# Patient Record
Sex: Male | Born: 1975 | Race: White | Hispanic: No | Marital: Married | Smoking: Former smoker
Health system: Southern US, Community
[De-identification: ages and names within clinical notes are randomized; demographics above are authoritative.]

## PROBLEM LIST (undated history)

## (undated) DIAGNOSIS — D649 Anemia, unspecified: Secondary | ICD-10-CM

## (undated) DIAGNOSIS — K900 Celiac disease: Secondary | ICD-10-CM

## (undated) DIAGNOSIS — K7460 Unspecified cirrhosis of liver: Secondary | ICD-10-CM

## (undated) DIAGNOSIS — I10 Essential (primary) hypertension: Secondary | ICD-10-CM

## (undated) DIAGNOSIS — J40 Bronchitis, not specified as acute or chronic: Secondary | ICD-10-CM

## (undated) DIAGNOSIS — J189 Pneumonia, unspecified organism: Secondary | ICD-10-CM

## (undated) HISTORY — DX: Pneumonia, unspecified organism: J18.9

## (undated) HISTORY — DX: Unspecified cirrhosis of liver: K74.60

## (undated) HISTORY — DX: Bronchitis, not specified as acute or chronic: J40

## (undated) HISTORY — PX: NOSE SURGERY: SHX723

## (undated) HISTORY — DX: Anemia, unspecified: D64.9

---

## 2018-08-07 ENCOUNTER — Emergency Department: Payer: Self-pay

## 2018-08-07 ENCOUNTER — Emergency Department
Admission: EM | Admit: 2018-08-07 | Discharge: 2018-08-07 | Disposition: A | Discharge status: Home / Self Care | Payer: Self-pay | Attending: Emergency Medicine | Admitting: Emergency Medicine

## 2018-08-07 ENCOUNTER — Encounter: Payer: Self-pay | Admitting: Emergency Medicine

## 2018-08-07 ENCOUNTER — Other Ambulatory Visit: Payer: Self-pay

## 2018-08-07 DIAGNOSIS — K7031 Alcoholic cirrhosis of liver with ascites: Secondary | ICD-10-CM | POA: Insufficient documentation

## 2018-08-07 DIAGNOSIS — I10 Essential (primary) hypertension: Secondary | ICD-10-CM | POA: Insufficient documentation

## 2018-08-07 HISTORY — DX: Essential (primary) hypertension: I10

## 2018-08-07 LAB — CBC
HCT: 39.4 % (ref 39.0–52.0)
Hemoglobin: 13.6 g/dL (ref 13.0–17.0)
MCH: 34.6 pg — ABNORMAL HIGH (ref 26.0–34.0)
MCHC: 34.5 g/dL (ref 30.0–36.0)
MCV: 100.3 fL — ABNORMAL HIGH (ref 80.0–100.0)
Platelets: 138 10*3/uL — ABNORMAL LOW (ref 150–400)
RBC: 3.93 MIL/uL — ABNORMAL LOW (ref 4.22–5.81)
RDW: 13.8 % (ref 11.5–15.5)
WBC: 4.9 10*3/uL (ref 4.0–10.5)
nRBC: 0 % (ref 0.0–0.2)

## 2018-08-07 LAB — COMPREHENSIVE METABOLIC PANEL
ALT: 84 U/L — ABNORMAL HIGH (ref 0–44)
AST: 367 U/L — ABNORMAL HIGH (ref 15–41)
Albumin: 3 g/dL — ABNORMAL LOW (ref 3.5–5.0)
Alkaline Phosphatase: 223 U/L — ABNORMAL HIGH (ref 38–126)
Anion gap: 14 (ref 5–15)
BUN: 14 mg/dL (ref 6–20)
CO2: 24 mmol/L (ref 22–32)
Calcium: 8.1 mg/dL — ABNORMAL LOW (ref 8.9–10.3)
Chloride: 99 mmol/L (ref 98–111)
Creatinine, Ser: 0.67 mg/dL (ref 0.61–1.24)
GFR calc Af Amer: >60 mL/min (ref >60)
GFR calc non Af Amer: >60 mL/min (ref >60)
Glucose, Bld: 93 mg/dL (ref 70–99)
Potassium: 3.4 mmol/L — ABNORMAL LOW (ref 3.5–5.1)
Sodium: 137 mmol/L (ref 135–145)
Total Bilirubin: 2.9 mg/dL — ABNORMAL HIGH (ref 0.3–1.2)
Total Protein: 8.3 g/dL — ABNORMAL HIGH (ref 6.5–8.1)

## 2018-08-07 LAB — URINALYSIS, COMPLETE (UACMP) WITH MICROSCOPIC
Bacteria, UA: NONE SEEN
Bilirubin Urine: NEGATIVE
Glucose, UA: NEGATIVE mg/dL
Hgb urine dipstick: NEGATIVE
Ketones, ur: 20 mg/dL — AB
Leukocytes,Ua: NEGATIVE
Nitrite: NEGATIVE
Protein, ur: 100 mg/dL — AB
Specific Gravity, Urine: 1.031 — ABNORMAL HIGH (ref 1.005–1.030)
Squamous Epithelial / HPF: NONE SEEN (ref 0–5)
pH: 5 (ref 5.0–8.0)

## 2018-08-07 LAB — LIPASE, BLOOD: Lipase: 51 U/L (ref 11–51)

## 2018-08-07 MED ORDER — LISINOPRIL-HYDROCHLOROTHIAZIDE 20-12.5 MG PO TABS: 1.0000 | ORAL_TABLET | Freq: Every day | ORAL | 2 refills | Status: DC

## 2018-08-07 MED ORDER — SODIUM CHLORIDE 0.9% FLUSH: 3.0000 mL | Freq: Once | INTRAVENOUS | Status: DC

## 2018-08-07 NOTE — ED Provider Notes (Signed)
Dg Abdomen 1 View  Result Date: 08/07/2018 CLINICAL DATA:  Acute abdominal distension. EXAM: ABDOMEN - 1 VIEW COMPARISON:  None. FINDINGS: The bowel gas pattern is normal. No radio-opaque calculi or other significant radiographic abnormality are seen. IMPRESSION: No evidence of bowel obstruction or ileus. Electronically Signed   By: Marijo Conception M.D.   On: 08/07/2018 14:16   US Abdomen Complete  Result Date: 08/07/2018 CLINICAL DATA:  Abnormal liver function tests. Ascites. Cirrhosis. Bloating for 3 days. EXAM: ABDOMEN ULTRASOUND COMPLETE COMPARISON:  Abdominal radiographs 08/07/2018 FINDINGS: Gallbladder: No gallstones. Mild gallbladder wall thickening at 0.4 cm. No sonographic Murphy sign noted by sonographer. Common bile duct: Diameter: 0.4 cm Liver: No focal lesion identified. Nodular contour with diffuse hyperechogenicity. Portal vein is patent on color Doppler imaging with normal direction of blood flow towards the liver. IVC: Not visualized due to overlying bowel gas. Pancreas: Not visualized due to overlying bowel gas. Spleen: Size and appearance within normal limits. Right Kidney: Length: 11.2 cm. Echogenicity within normal limits. No mass or hydronephrosis visualized. Left Kidney: Length: 11.6 cm. Echogenicity within normal limits. No mass or hydronephrosis visualized. Abdominal aorta: Not well seen due to overlying bowel gas Other findings: Moderate ascites. IMPRESSION: 1. Hyperechoic liver with nodular contour compatible with cirrhosis. 2. Moderate ascites. 3. Overlying bowel gas obscures the aorta, IVC, and pancreas. 4. Mild gallbladder wall thickening which could be from inflammation, but could also be from the patient's hypoalbuminemia. Electronically Signed   By: Van Clines M.D.   On: 08/07/2018 14:33     Discussed case and care with Dr. Allen Norris.  On exam, patient is awake and alert, no distress.  His abdomen is soft and not markedly distended.  Review his ultrasound as well as well  as his examination I do not think a diagnostic or therapeutic paracentesis is indicated.  Normal white count afebrile not complain of any infectious symptoms.  Additionally, he does not have a ton of fluid on ultrasound that would make me comfortable doing a bedside paracentesis.  Rather, plan of care will be for the patient to follow-up closely with GI consulting with the clinic on Monday as well as discontinuation of alcohol use.  Patient reports to me that he will never drink again, he reports he never goes through withdrawals despite his heavy drinking he reports he can go several days without any shakes or tremors or withdrawal symptoms.  Return precautions and treatment recommendations and follow-up discussed with the patient who is agreeable with the plan.  Shawn Martin was evaluated in Emergency Department on 08/07/2018 for the symptoms described in the history of present illness. He was evaluated in the context of the global COVID-19 pandemic, which necessitated consideration that the patient might be at risk for infection with the SARS-CoV-2 virus that causes COVID-19. Institutional protocols and algorithms that pertain to the evaluation of patients at risk for COVID-19 are in a state of rapid change based on information released by regulatory bodies including the CDC and federal and state organizations. These policies and algorithms were followed during the patient's care in the ED.      Medical screening examination/treatment/procedure(s) were conducted as a shared visit with non-physician practitioner(s) and myself.  I personally evaluated the patient during the encounter.      Delman Kitten, MD 08/07/18 1526

## 2018-08-07 NOTE — Discharge Instructions (Signed)
Your exam, labs, and [place] results are consistent with mild ascites and cirrhosis of the liver. This is due to your long history of regular alcohol drinking. You should try to limit and ultimately stop drinking. Follow-up with Dr. Allen Norris via phone on Monday, for further management. Return to the ED for severe abdominal pain or shortness of breath.

## 2018-08-07 NOTE — ED Triage Notes (Signed)
Pt to ED via POV, pt was sent over from [facility] for evaluation of abdominal bloating. Pt states that this has been going on for 3 days. Pt states that yesterday he drink a bottle of mag citrate. Pt states that he has recently been taking OTC medication to help with sex drive since getting married recently. Pt is also having some difficulty starting his urine stream but denies any other urinary symptoms. Pt is in NAD.

## 2018-08-07 NOTE — ED Provider Notes (Signed)
[facility] Emergency Department Provider Note ____________________________________________  Time seen: 1320  I have reviewed the triage vital signs and the nursing notes.  HISTORY  Chief Complaint  Bloated  HPI Shawn Martin is a 43 y.o. male [facility] for [facility] of abdominal bloating.  Patient reports over the last 3 days he has had fullness and some mild distention to his abdomen.  Patient thought he may have constipation, so he drank a bottle of magnesium citrate yesterday.  He reports having soft bowel movements throughout the day yesterday without hematochezia or melena noted.  Patient denies any frank abdominal pain, chest pain, shortness of breath, nausea, vomiting, or dizziness.  He does admit to some high life stressors at this moment, noting he is a newly with over the last 4 weeks.  He recently relocated to the area from [place], and has been unable to find employment in the time being. He admits to being regular drinker, and admits to increased alcohol intake over the last few weeks secondary to the stressors.  He told my attending that he drinks a pint of alcohol most days.  Patient denies any shaking, tremors, irritability, palpitations, headache, or syncope.  He denies any sick contacts, bad food, other exposures.  Past Medical History:  Diagnosis Date  . Hypertension     There are no active problems to display for this patient.   Past Surgical History:  Procedure Laterality Date  . NOSE SURGERY      Prior to Admission medications   Medication Sig Start Date End Date Taking? Authorizing Provider  lisinopril-hydrochlorothiazide (ZESTORETIC) 20-12.5 MG tablet Take 1 tablet by mouth daily. 08/07/18 11/05/18  Lennyx Verdell, Dannielle Karvonen, PA-C    Allergies Patient has no known allergies.  No family history on file.  Social History Social History   Tobacco Use  . Smoking status: Never Smoker  . Smokeless tobacco: Never  Used  Substance Use Topics  . Alcohol use: Yes    Comment: every day drinker, 6 pack per day  . Drug use: Not Currently    Review of Systems  Constitutional: Negative for fever. Eyes: Negative for visual changes. ENT: Negative for sore throat. Cardiovascular: Negative for chest pain. Respiratory: Negative for shortness of breath. Gastrointestinal: Negative for abdominal pain, vomiting and diarrhea. Reports bloating. Genitourinary: Negative for dysuria. Musculoskeletal: Negative for back pain. Skin: Negative for rash. Neurological: Negative for headaches, focal weakness or numbness. ____________________________________________  PHYSICAL EXAM:  VITAL SIGNS: ED Triage Vitals  Enc Vitals Group     BP 08/07/18 1128 (!) 157/101     Pulse Rate 08/07/18 1128 (!) 110     Resp 08/07/18 1128 18     Temp 08/07/18 1128 98 F (36.7 C)     Temp Source 08/07/18 1128 Oral     SpO2 08/07/18 1128 97 %     Weight 08/07/18 1133 220 lb (99.8 kg)     Height 08/07/18 1133 6' (1.829 m)     Head Circumference --      Peak Flow --      Pain Score 08/07/18 1133 0     Pain Loc --      Pain Edu? --      Excl. in [place]? --     Constitutional: Alert and oriented. Well appearing and in no distress. Head: Normocephalic and atraumatic. Eyes: Conjunctivae are an ictheric. Normal extraocular movements Cardiovascular: Normal rate, regular rhythm. Normal distal pulses. Respiratory: Normal respiratory effort.  No wheezes/rales/rhonchi. Gastrointestinal: Soft and nontender. Mild distention noted without rigidity, guarding, or organomegaly. Normoactive bowel sounds noted x 4. Tympany on percussion x 4.  Musculoskeletal: Nontender with normal range of motion in all extremities.  Neurologic:  Normal gait without ataxia. Normal speech and language. No gross focal neurologic deficits are appreciated. Skin:  Skin is warm, dry and intact. No rash noted. Psychiatric: Mood and affect are normal. Patient exhibits  appropriate insight and judgment. ____________________________________________   LABS (pertinent positives/negatives) Labs Reviewed  COMPREHENSIVE METABOLIC PANEL - Abnormal; Notable for the following components:      Result Value   Potassium 3.4 (*)    Calcium 8.1 (*)    Total Protein 8.3 (*)    Albumin 3.0 (*)    AST 367 (*)    ALT 84 (*)    Alkaline Phosphatase 223 (*)    Total Bilirubin 2.9 (*)    All other components within normal limits  CBC - Abnormal; Notable for the following components:   RBC 3.93 (*)    MCV 100.3 (*)    MCH 34.6 (*)    Platelets 138 (*)    All other components within normal limits  URINALYSIS, COMPLETE (UACMP) WITH MICROSCOPIC - Abnormal; Notable for the following components:   Color, Urine AMBER (*)    APPearance HAZY (*)    Specific Gravity, Urine 1.031 (*)    Ketones, ur 20 (*)    Protein, ur 100 (*)    All other components within normal limits  LIPASE, BLOOD  ____________________________________________   RADIOLOGY DG ABD Upright  IMPRESSION: No evidence of bowel obstruction or ileus.  ABD [place] Complete  IMPRESSION: 1. Hyperechoic liver with nodular contour compatible with cirrhosis. 2. Moderate ascites. 3. Overlying bowel gas obscures the aorta, IVC, and pancreas. 4. Mild gallbladder wall thickening which could be from inflammation, but could also be from the patient's hypoalbuminemia. ____________________________________________  PROCEDURES  Procedures ____________________________________________  INITIAL IMPRESSION / ASSESSMENT AND PLAN / ED COURSE  Shawn Martin was evaluated in Emergency Department on 08/07/2018 for the symptoms described in the history of present illness. He was evaluated in the context of the global COVID-19 pandemic, which necessitated consideration that the patient might be at risk for infection with the SARS-CoV-2 virus that causes COVID-19. Institutional protocols and algorithms that pertain to the  evaluation of patients at risk for COVID-19 are in a state of rapid change based on information released by regulatory bodies including the CDC and federal and state organizations. These policies and algorithms were followed during the patient's care in the ED.  Differential diagnosis includes, but is not limited to, acute appendicitis, renal colic, testicular torsion, urinary tract infection/pyelonephritis, prostatitis,  epididymitis, diverticulitis, small bowel obstruction or ileus, colitis, abdominal aortic aneurysm, gastroenteritis, hernia, etc.  ----------------------------------------- 2:54 PM on 08/07/2018 ----------------------------------------- S/w Dr. Allen Norris: He believes the patient might benefit from a therapeutic paracentesis, but US shows only mild ascites on imaging. He will be willing to see the patient in the office if admission is declined.    Patient with ED evaluation of abdominal discomfort described by the patient bloating.  Patient with a history of chronic alcohol use, is found to have new onset ascites secondary to mild cirrhosis.  Patient has been updated on his lab and ultrasound findings.  He is motivated to stop drinking at this time.  He was given opportunity to ask questions, and understands his discharge instructions.  He is to follow-up with gastroenterology next week for further  management of his symptoms.  He is encouraged to avoid acetaminophen in the interim, and he should return to the ED immediately for any increased abdominal pain, shortness of breath, nausea, or vomiting. ____________________________________________  FINAL CLINICAL IMPRESSION(S) / ED DIAGNOSES  Final diagnoses:  Alcoholic cirrhosis of liver with ascites (HCC)       Gennell How, Dannielle Karvonen, PA-C 08/07/18 1812    Delman Kitten, MD 08/08/18 1600

## 2019-01-15 DIAGNOSIS — Z20828 Contact with and (suspected) exposure to other viral communicable diseases: Secondary | ICD-10-CM | POA: Diagnosis not present

## 2019-01-28 ENCOUNTER — Other Ambulatory Visit (INDEPENDENT_AMBULATORY_CARE_PROVIDER_SITE_OTHER): Payer: BC Managed Care – PPO

## 2019-01-28 ENCOUNTER — Encounter: Payer: Self-pay | Admitting: Gastroenterology

## 2019-01-28 ENCOUNTER — Ambulatory Visit: Payer: BC Managed Care – PPO | Admitting: Gastroenterology

## 2019-01-28 VITALS — BP 138/88 | HR 116 | Temp 98.7°F | Ht 72.0 in | Wt 230.0 lb

## 2019-01-28 DIAGNOSIS — Z1159 Encounter for screening for other viral diseases: Secondary | ICD-10-CM

## 2019-01-28 DIAGNOSIS — R188 Other ascites: Secondary | ICD-10-CM | POA: Diagnosis not present

## 2019-01-28 DIAGNOSIS — K7031 Alcoholic cirrhosis of liver with ascites: Secondary | ICD-10-CM

## 2019-01-28 LAB — CBC WITH DIFFERENTIAL/PLATELET
Basophils Absolute: 0 10*3/uL (ref 0.0–0.1)
Basophils Relative: 1.4 % (ref 0.0–3.0)
Eosinophils Absolute: 0.1 10*3/uL (ref 0.0–0.7)
Eosinophils Relative: 1.9 % (ref 0.0–5.0)
HCT: 42.7 % (ref 39.0–52.0)
Hemoglobin: 14.4 g/dL (ref 13.0–17.0)
Lymphocytes Relative: 41.7 % (ref 12.0–46.0)
Lymphs Abs: 1.4 10*3/uL (ref 0.7–4.0)
MCHC: 33.6 g/dL (ref 30.0–36.0)
MCV: 99.3 fl (ref 78.0–100.0)
Monocytes Absolute: 0.4 10*3/uL (ref 0.1–1.0)
Monocytes Relative: 13.4 % — ABNORMAL HIGH (ref 3.0–12.0)
Neutro Abs: 1.4 10*3/uL (ref 1.4–7.7)
Neutrophils Relative %: 41.6 % — ABNORMAL LOW (ref 43.0–77.0)
Platelets: 162 10*3/uL (ref 150.0–400.0)
RBC: 4.3 Mil/uL (ref 4.22–5.81)
RDW: 14.3 % (ref 11.5–15.5)
WBC: 3.4 10*3/uL — ABNORMAL LOW (ref 4.0–10.5)

## 2019-01-28 LAB — IBC PANEL
Iron: 127 ug/dL (ref 42–165)
Saturation Ratios: 44.3 % (ref 20.0–50.0)
Transferrin: 205 mg/dL — ABNORMAL LOW (ref 212.0–360.0)

## 2019-01-28 LAB — PROTIME-INR
INR: 1.2 ratio — ABNORMAL HIGH (ref 0.8–1.0)
Prothrombin Time: 14 s — ABNORMAL HIGH (ref 9.6–13.1)

## 2019-01-28 LAB — FERRITIN: Ferritin: 141.7 ng/mL (ref 22.0–322.0)

## 2019-01-28 MED ORDER — SPIRONOLACTONE 50 MG PO TABS: 50.0000 mg | ORAL_TABLET | Freq: Every day | ORAL | 2 refills | Status: DC

## 2019-01-28 MED ORDER — FUROSEMIDE 20 MG PO TABS: 20.0000 mg | ORAL_TABLET | Freq: Every day | ORAL | 2 refills | Status: DC

## 2019-01-28 NOTE — Patient Instructions (Addendum)
If you are age 45 or older, your body mass index should be between 23-30. Your Body mass index is 31.19 kg/m. If this is out of the aforementioned range listed, please consider follow up with your Primary Care Provider.  If you are age 73 or younger, your body mass index should be between 19-25. Your Body mass index is 31.19 kg/m. If this is out of the aformentioned range listed, please consider follow up with your Primary Care Provider.    Your provider has requested that you go to the basement level for lab work before leaving today. Press "B" on the elevator. The lab is located at the first door on the left as you exit the elevator.   1. Please come back to our office on 11/6 for repeat labs   We have sent the following medications to your pharmacy for you to pick up at your convenience:   1. Start Lasix 20 mg daily.    2. Start Aldactone 50 mg daily.    You have been scheduled for an abdominal ultrasound at [facility] (1st floor of hospital) on 02/07/19 at 10:00am. Please arrive 15 minutes prior to your appointment for registration. Make certain not to have anything to eat or drink 6 hours prior to your appointment. Should you need to reschedule your appointment, please contact radiology at [phone]. This test typically takes about 30 minutes to perform.   You have been scheduled for an abdominal paracentesis at [facility] radiology (1st floor of hospital) on 02/07/19 at 9:00am. Please arrive at least 15 minutes prior to your appointment time for registration. Should you need to reschedule this appointment for any reason, please call our office at [phone].  You have been scheduled for an endoscopy. Please follow written instructions given to you at your visit today. If you use inhalers (even only as needed), please bring them with you on the day of your procedure. Your physician has requested that you go to www.startemmi.com and enter the access code given to you at your  visit today. This web site gives a general overview about your procedure. However, you should still follow specific instructions given to you by our office regarding your preparation for the procedure.

## 2019-01-28 NOTE — Progress Notes (Signed)
Agree with assessment and plan as outlined.  

## 2019-01-28 NOTE — Progress Notes (Signed)
01/28/2019 Shawn Martin [id number] 09-24-1975   HISTORY OF PRESENT ILLNESS: This is a pleasant 43 year old male who is new to our office.  He is here today at the recommendation from the emergency department.  He was seen there back in May for high blood pressure and complaints of abdominal bloating.  He was diagnosed at that time with cirrhosis and associated ascites.  He moved here from [place] fairly recently and does not yet have a PCP.  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  Unfortunately he delayed his evaluation and is here now to get things straightened out.  He has been completely sober from alcohol for 13 days, last alcohol being October 17.  He complains of abdominal bloating and swelling, but not pain per se.  Some lower extremity swelling.  No confusion.  No sign of GI bleeding.   Past Medical History:  Diagnosis Date   Hepatic cirrhosis ([place])    Hypertension    Past Surgical History:  Procedure Laterality Date   NOSE SURGERY      reports that he has never smoked. He has quit using smokeless tobacco.  His smokeless tobacco use included chew. He reports previous alcohol use. He reports that he does not use drugs. family history includes Hypertension in his father; Leukemia in his brother. No Known Allergies    Outpatient Encounter Medications as of 01/28/2019  Medication Sig   lisinopril-hydrochlorothiazide (ZESTORETIC) 20-12.5 MG tablet Take 1 tablet by mouth daily. (Patient not taking: Reported on 01/28/2019)   No facility-administered encounter medications on file as of 01/28/2019.      REVIEW OF SYSTEMS  : All other systems reviewed and negative except where noted in the History of Present Illness.   PHYSICAL EXAM: BP 138/88    Pulse (!) 116    Temp 98.7 F (37.1 C)    Ht 6' (1.829 m)    Wt  230 lb (104.3 kg)    BMI 31.19 kg/m  General: Well developed white male in no acute distress Head: Normocephalic and atraumatic Eyes:  Sclerae anicteric, conjunctiva pink. Ears: Normal auditory acuity Lungs: Clear throughout to auscultation; no increased WOB. Heart: Regular rate and rhythm; no M/R/G. Abdomen: Soft, distended with ascites fluid.  BS present.  Non-tender. Musculoskeletal: Symmetrical with no gross deformities  Skin: No lesions on visible extremities Extremities: 1-2+ pitting edema noted in B/L LE's. Neurological: Alert oriented x 4, grossly non-focal Psychological:  Alert and cooperative. Normal mood and affect  ASSESSMENT AND PLAN: *43 year old male with a new diagnosis of cirrhosis with ascites.  Highly suspicious that this is secondary to alcohol as he has been drinking very heavily for quite some time in relation to depression from divorce, ex-wife, and other personal issues at home.  Has not had any recent labs as this was all diagnosed back in May.  We will repeat CBC and CMP as well as checking a PT/INR, AFP level, hepatitis B and C serologies, iron studies.  We will repeat abdominal ultrasound for [place] screening.  Needs EGD for variceal screening.  I am going to schedule him for a paracentesis with no more than 2 L removed.  This is going to mostly for diagnostic purposes, we will try to give him a little bit of relief as well although he is not extremely symptomatic at  this point.  We will send fluid studies for cell count, culture, cytology, and albumin.  We will start low-dose Lasix 20 mg daily and spironolactone 50 mg daily.  Prescriptions sent.  I then want a repeat a BMP in 7 to 10 days.  Will schedule for EGD with Dr. Havery Moros.  Discussed the 2 g sodium diet as well as recording daily weights.  **The risks, benefits, and alternatives to EGD were discussed with the patient and he consents to proceed.   CC:  No ref. provider found

## 2019-01-31 LAB — HEPATITIS B SURFACE ANTIBODY,QUALITATIVE: Hep B S Ab: NONREACTIVE

## 2019-01-31 LAB — HEPATITIS B SURFACE ANTIGEN: Hepatitis B Surface Ag: NONREACTIVE

## 2019-01-31 LAB — HEPATITIS C ANTIBODY
Hepatitis C Ab: NONREACTIVE
SIGNAL TO CUT-OFF: 0.11 (ref <1.00)

## 2019-01-31 LAB — AFP TUMOR MARKER: AFP-Tumor Marker: 4.3 ng/mL (ref <6.1)

## 2019-02-01 ENCOUNTER — Other Ambulatory Visit: Payer: Self-pay

## 2019-02-01 DIAGNOSIS — K7031 Alcoholic cirrhosis of liver with ascites: Secondary | ICD-10-CM

## 2019-02-04 ENCOUNTER — Other Ambulatory Visit (INDEPENDENT_AMBULATORY_CARE_PROVIDER_SITE_OTHER): Payer: BC Managed Care – PPO

## 2019-02-04 ENCOUNTER — Encounter: Payer: Self-pay | Admitting: Gastroenterology

## 2019-02-04 ENCOUNTER — Other Ambulatory Visit: Payer: Self-pay | Admitting: Gastroenterology

## 2019-02-04 DIAGNOSIS — K7031 Alcoholic cirrhosis of liver with ascites: Secondary | ICD-10-CM

## 2019-02-04 DIAGNOSIS — Z1159 Encounter for screening for other viral diseases: Secondary | ICD-10-CM | POA: Diagnosis not present

## 2019-02-04 LAB — COMPREHENSIVE METABOLIC PANEL
ALT: 46 U/L (ref 0–53)
AST: 110 U/L — ABNORMAL HIGH (ref 0–37)
Albumin: 2.6 g/dL — ABNORMAL LOW (ref 3.5–5.2)
Alkaline Phosphatase: 112 U/L (ref 39–117)
BUN: 6 mg/dL (ref 6–23)
CO2: 31 mEq/L (ref 19–32)
Calcium: 7.7 mg/dL — ABNORMAL LOW (ref 8.4–10.5)
Chloride: 95 mEq/L — ABNORMAL LOW (ref 96–112)
Creatinine, Ser: 0.68 mg/dL (ref 0.40–1.50)
GFR: 125.81 mL/min (ref >60.00)
Glucose, Bld: 119 mg/dL — ABNORMAL HIGH (ref 70–99)
Potassium: 3.3 mEq/L — ABNORMAL LOW (ref 3.5–5.1)
Sodium: 134 mEq/L — ABNORMAL LOW (ref 135–145)
Total Bilirubin: 1.6 mg/dL — ABNORMAL HIGH (ref 0.2–1.2)
Total Protein: 6.5 g/dL (ref 6.0–8.3)

## 2019-02-04 LAB — SARS CORONAVIRUS 2 (TAT 6-24 HRS): SARS Coronavirus 2: NEGATIVE

## 2019-02-07 ENCOUNTER — Other Ambulatory Visit: Payer: Self-pay

## 2019-02-07 ENCOUNTER — Encounter (HOSPITAL_COMMUNITY): Payer: Self-pay | Admitting: Radiology

## 2019-02-07 ENCOUNTER — Ambulatory Visit (HOSPITAL_COMMUNITY)
Admission: RE | Admit: 2019-02-07 | Discharge: 2019-02-07 | Disposition: A | Discharge status: Home / Self Care | Payer: BC Managed Care – PPO | Source: Ambulatory Visit | Attending: Gastroenterology | Admitting: Gastroenterology

## 2019-02-07 DIAGNOSIS — R188 Other ascites: Secondary | ICD-10-CM | POA: Insufficient documentation

## 2019-02-07 DIAGNOSIS — K7031 Alcoholic cirrhosis of liver with ascites: Secondary | ICD-10-CM

## 2019-02-07 DIAGNOSIS — K7460 Unspecified cirrhosis of liver: Secondary | ICD-10-CM | POA: Diagnosis not present

## 2019-02-07 HISTORY — PX: IR PARACENTESIS: IMG2679

## 2019-02-07 LAB — BODY FLUID CELL COUNT WITH DIFFERENTIAL
Lymphs, Fluid: 67 %
Monocyte-Macrophage-Serous Fluid: 30 % — ABNORMAL LOW (ref 50–90)
Neutrophil Count, Fluid: 3 % (ref 0–25)
Total Nucleated Cell Count, Fluid: 61 cu mm (ref 0–1000)

## 2019-02-07 LAB — ALBUMIN, PLEURAL OR PERITONEAL FLUID: Albumin, Fluid: <1 g/dL

## 2019-02-07 LAB — GRAM STAIN

## 2019-02-07 MED ORDER — LIDOCAINE HCL (PF) 1 % IJ SOLN
INTRAMUSCULAR | Status: DC | PRN
  Administered 2019-02-07: 10 mL via SUBCUTANEOUS

## 2019-02-07 MED ORDER — LIDOCAINE HCL 1 % IJ SOLN
INTRAMUSCULAR | Status: AC
  Filled 2019-02-07: qty 20

## 2019-02-07 NOTE — Procedures (Signed)
PROCEDURE SUMMARY:  Successful US guided paracentesis from RLQ.  Yielded 2L of hazy yellow fluid.  No immediate complications.  Pt tolerated well.   Specimen was sent for labs.  EBL < 40m  KAscencion DikePA-C 02/07/2019 9:39 AM

## 2019-02-08 ENCOUNTER — Encounter: Payer: Self-pay | Admitting: Gastroenterology

## 2019-02-08 ENCOUNTER — Ambulatory Visit (AMBULATORY_SURGERY_CENTER): Payer: BC Managed Care – PPO | Admitting: Gastroenterology

## 2019-02-08 VITALS — BP 144/90 | HR 90 | Temp 98.5°F | Resp 19 | Ht 73.0 in | Wt 230.0 lb

## 2019-02-08 DIAGNOSIS — K2950 Unspecified chronic gastritis without bleeding: Secondary | ICD-10-CM | POA: Diagnosis not present

## 2019-02-08 DIAGNOSIS — I8510 Secondary esophageal varices without bleeding: Secondary | ICD-10-CM

## 2019-02-08 DIAGNOSIS — K766 Portal hypertension: Secondary | ICD-10-CM | POA: Diagnosis not present

## 2019-02-08 DIAGNOSIS — K7031 Alcoholic cirrhosis of liver with ascites: Secondary | ICD-10-CM

## 2019-02-08 DIAGNOSIS — K3189 Other diseases of stomach and duodenum: Secondary | ICD-10-CM | POA: Diagnosis not present

## 2019-02-08 LAB — CYTOLOGY - NON PAP

## 2019-02-08 MED ORDER — SODIUM CHLORIDE 0.9 % IV SOLN: 500.0000 mL | Freq: Once | INTRAVENOUS | Status: DC

## 2019-02-08 MED ORDER — PROPRANOLOL HCL 10 MG PO TABS: 10.0000 mg | ORAL_TABLET | Freq: Two times a day (BID) | ORAL | 3 refills | Status: DC

## 2019-02-08 NOTE — Progress Notes (Signed)
Pt's states no medical or surgical changes since previsit or office visit.  Cw vitals, SF IV and JB temps.

## 2019-02-08 NOTE — Progress Notes (Signed)
Called to room to assist during endoscopic procedure.  Patient ID and intended procedure confirmed with present staff. Received instructions for my participation in the procedure from the performing physician.  

## 2019-02-08 NOTE — Op Note (Signed)
[facility] Patient Name: Shawn Martin Procedure Date: 02/08/2019 10:28 AM MRN: [id number] Endoscopist: Remo Lipps P. Havery Moros , MD Age: 43 Referring MD:  Date of Birth: [birth year] Gender: Male Account #: [id number] Procedure:                Upper GI endoscopy Indications:              Cirrhosis rule out esophageal varices, history of                            ascites on aldactone / lasix, recent paracentesis. Medicines:                Monitored Anesthesia Care Procedure:                Pre-Anesthesia Assessment:                           - Prior to the procedure, a History and Physical                            was performed, and patient medications and                            allergies were reviewed. The patient's tolerance of                            previous anesthesia was also reviewed. The risks                            and benefits of the procedure and the sedation                            options and risks were discussed with the patient.                            All questions were answered, and informed consent                            was obtained. Prior Anticoagulants: The patient has                            taken no previous anticoagulant or antiplatelet                            agents. ASA Grade Assessment: II - A patient with                            mild systemic disease. After reviewing the risks                            and benefits, the patient was deemed in                            satisfactory condition to undergo the procedure.  After obtaining informed consent, the endoscope was                            passed under direct vision. Throughout the                            procedure, the patient's blood pressure, pulse, and                            oxygen saturations were monitored continuously. The                            Endoscope was introduced through the mouth, and   advanced to the second part of duodenum. The upper                            GI endoscopy was accomplished without difficulty.                            The patient tolerated the procedure well. Scope In: Scope Out: Findings:                 Esophagogastric landmarks were identified: the                            Z-line was found at 37 cm, the gastroesophageal                            junction was found at 37 cm and the upper extent of                            the gastric folds was found at 37 cm from the                            incisors.                           Grade II varices were found in the middle third of                            the esophagus and in the lower third of the                            esophagus. They were medium in size. No high risk                            stigmata for bleeding was noted.                           The exam of the esophagus was otherwise normal.                           Portal hypertensive gastropathy was found in the  gastric fundus and in the gastric body.                           The exam of the stomach was otherwise normal.                           Biopsies were taken with a cold forceps in the                            gastric body, at the incisura and in the gastric                            antrum for Helicobacter pylori testing.                           Diffuse mucosal flattening with some scalloping was                            found in the second portion of the duodenum,                            perhaps due to portal hypertension. Biopsies for                            histology were taken with a cold forceps for                            evaluation of celiac disease.                           The exam of the duodenum was otherwise normal. Complications:            No immediate complications. Estimated blood loss:                            Minimal. Estimated Blood Loss:     Estimated  blood loss was minimal. Impression:               - Esophagogastric landmarks identified.                           - Grade II esophageal varices.                           - Portal hypertensive gastropathy.                           - Flattened / scalloped mucosa was found in the                            duodenum. Biopsied.                           - Biopsies were taken with a cold forceps for  Helicobacter pylori testing. Recommendation:           - Patient has a contact number available for                            emergencies. The signs and symptoms of potential                            delayed complications were discussed with the                            patient. Return to normal activities tomorrow.                            Written discharge instructions were provided to the                            patient.                           - Resume previous diet.                           - Continue present medications.                           - Start propranolol for treatment of varices. Given                            recently started diuretics for ascites, will start                            low dose with 79m twice daily. Repeat BMET in 2                            weeks with planned follow up in the office.                           - Await pathology results.                           - Complete alcohol abstinence SRemo LippsP. Grier Vu, MD 02/08/2019 11:00:52 AM This report has been signed electronically.

## 2019-02-08 NOTE — Progress Notes (Signed)
PT taken to PACU. Monitors in place. VSS. Report given to RN. 

## 2019-02-08 NOTE — Patient Instructions (Signed)
Impression/Recommendations:  Resume previous diet. Continue present medications.  Start Propranolol 10 mg 2 times daily.  Repeat BMET in 2 weeks with planned follow-up in office.  Await pathology results.  Complete alcohol abstinence.  YOU HAD AN ENDOSCOPIC PROCEDURE TODAY AT [facility]:   Refer to the procedure report that was given to you for any specific questions about what was found during the examination.  If the procedure report does not answer your questions, please call your gastroenterologist to clarify.  If you requested that your care partner not be given the details of your procedure findings, then the procedure report has been included in a sealed envelope for you to review at your convenience later.  YOU SHOULD EXPECT: Some feelings of bloating in the abdomen. Passage of more gas than usual.  Walking can help get rid of the air that was put into your GI tract during the procedure and reduce the bloating. If you had a lower endoscopy (such as a colonoscopy or flexible sigmoidoscopy) you may notice spotting of blood in your stool or on the toilet paper. If you underwent a bowel prep for your procedure, you may not have a normal bowel movement for a few days.  Please Note:  You might notice some irritation and congestion in your nose or some drainage.  This is from the oxygen used during your procedure.  There is no need for concern and it should clear up in a day or so.  SYMPTOMS TO REPORT IMMEDIATELY:   Following upper endoscopy (EGD)  Vomiting of blood or coffee ground material  New chest pain or pain under the shoulder blades  Painful or persistently difficult swallowing  New shortness of breath  Fever of 100F or higher  Black, tarry-looking stools  For urgent or emergent issues, a gastroenterologist can be reached at any hour by calling [phone].   DIET:  We do recommend a small meal at first, but then you may proceed to your regular diet.   Drink plenty of fluids but you should avoid alcoholic beverages for 24 hours.  ACTIVITY:  You should plan to take it easy for the rest of today and you should NOT DRIVE or use heavy machinery until tomorrow (because of the sedation medicines used during the test).    FOLLOW UP: Our staff will call the number listed on your records 48-72 hours following your procedure to check on you and address any questions or concerns that you may have regarding the information given to you following your procedure. If we do not reach you, we will leave a message.  We will attempt to reach you two times.  During this call, we will ask if you have developed any symptoms of COVID 19. If you develop any symptoms (ie: fever, flu-like symptoms, shortness of breath, cough etc.) before then, please call [phone].  If you test positive for Covid 19 in the 2 weeks post procedure, please call and report this information to [place].    If any biopsies were taken you will be contacted by phone or by letter within the next 1-3 weeks.  Please call us at [phone] if you have not heard about the biopsies in 3 weeks.    SIGNATURES/CONFIDENTIALITY: You and/or your care partner have signed paperwork which will be entered into your electronic medical record.  These signatures attest to the fact that that the information above on your After Visit Summary has been reviewed and is understood.  Full responsibility  of the confidentiality of this discharge information lies with you and/or your care-partner.

## 2019-02-09 ENCOUNTER — Telehealth: Payer: Self-pay

## 2019-02-09 NOTE — Telephone Encounter (Signed)
Patient to come into our lab in 2 weeks for BMET. Scheduled office visit with Dr. Havery Moros on 03/29/19

## 2019-02-09 NOTE — Telephone Encounter (Signed)
-----   Message from [name], MD sent at 02/08/2019  5:07 PM EST ----- Regarding: follow up Hi Sherlynn Stalls, This patient needs a BMET in 2 weeks if you could coordinate and also an office follow up with me in the next 1-2 months. Thanks

## 2019-02-10 ENCOUNTER — Telehealth: Payer: Self-pay

## 2019-02-10 NOTE — Telephone Encounter (Signed)
  Follow up Call-  Call back number 02/08/2019  Post procedure Call Back phone  # [phone]  Permission to leave phone message Yes     Patient questions:  Do you have a fever, pain , or abdominal swelling? No. Pain Score  0 *  Have you tolerated food without any problems? Yes.    Have you been able to return to your normal activities? Yes.    Do you have any questions about your discharge instructions: Diet   No. Medications  No. Follow up visit  No.  Do you have questions or concerns about your Care? No.  Actions: * If pain score is 4 or above: No action needed, pain <4.  1. Have you developed a fever since your procedure? no  2.   Have you had an respiratory symptoms (SOB or cough) since your procedure? no  3.   Have you tested positive for COVID 19 since your procedure? no  4.   Have you had any family members/close contacts diagnosed with the COVID 19 since your procedure?  no   If yes to any of these questions please route to Joylene John, RN and Alphonsa Gin, Therapist, sports.

## 2019-02-12 LAB — CULTURE, BODY FLUID W GRAM STAIN -BOTTLE: Culture: NO GROWTH

## 2019-02-14 ENCOUNTER — Other Ambulatory Visit: Payer: Self-pay

## 2019-02-14 DIAGNOSIS — R188 Other ascites: Secondary | ICD-10-CM

## 2019-02-22 ENCOUNTER — Other Ambulatory Visit (INDEPENDENT_AMBULATORY_CARE_PROVIDER_SITE_OTHER): Payer: BC Managed Care – PPO

## 2019-02-22 DIAGNOSIS — R188 Other ascites: Secondary | ICD-10-CM

## 2019-02-22 LAB — IGA: IgA: 542 mg/dL — ABNORMAL HIGH (ref 68–378)

## 2019-02-26 LAB — ANTI-NUCLEAR AB-TITER (ANA TITER)
ANA TITER: 1:320 {titer} — ABNORMAL HIGH
ANA Titer 1: 1:320 {titer} — ABNORMAL HIGH

## 2019-02-26 LAB — ALPHA-1-ANTITRYPSIN: A-1 Antitrypsin, Ser: 198 mg/dL (ref 83–199)

## 2019-02-26 LAB — TISSUE TRANSGLUTAMINASE, IGA: (tTG) Ab, IgA: >100 U/mL — ABNORMAL HIGH

## 2019-02-26 LAB — IGG: IgG (Immunoglobin G), Serum: 2245 mg/dL — ABNORMAL HIGH (ref 600–1640)

## 2019-02-26 LAB — ANTI-SMOOTH MUSCLE ANTIBODY, IGG: Actin (Smooth Muscle) Antibody (IGG): 47 U — ABNORMAL HIGH (ref <20)

## 2019-02-26 LAB — CERULOPLASMIN: Ceruloplasmin: 33 mg/dL (ref 18–36)

## 2019-02-26 LAB — HEPATITIS A ANTIBODY, TOTAL: Hepatitis A AB,Total: NONREACTIVE

## 2019-02-26 LAB — ANA: Anti Nuclear Antibody (ANA): POSITIVE — AB

## 2019-03-02 ENCOUNTER — Other Ambulatory Visit: Payer: Self-pay

## 2019-03-02 ENCOUNTER — Telehealth (HOSPITAL_COMMUNITY): Payer: Self-pay

## 2019-03-02 DIAGNOSIS — K900 Celiac disease: Secondary | ICD-10-CM

## 2019-03-02 DIAGNOSIS — K754 Autoimmune hepatitis: Secondary | ICD-10-CM

## 2019-03-02 NOTE — Telephone Encounter (Signed)
-----   Message from Arne Cleveland, MD sent at 03/02/2019 12:18 PM EST ----- Regarding: RE: Transcatheter biopsy Ok  IR TJ liver bx  DDH   ----- Message ----- From: Danielle Dess Sent: 03/02/2019  10:56 AM EST To: Ir Procedure Requests Subject: Transcatheter biopsy                           Procedure: Transcatheter biopsy  Transjugular (because of ascites)  Dx: autoimmune hepatitis  Ordering: Dr. Hebron Cellar [phone]  Imaging: [place] abd limited 02/07/19 in Epic  Please review.  Thanks, Lia Foyer

## 2019-03-07 ENCOUNTER — Telehealth: Payer: Self-pay

## 2019-03-07 ENCOUNTER — Other Ambulatory Visit: Payer: Self-pay

## 2019-03-07 ENCOUNTER — Telehealth: Payer: Self-pay | Admitting: Gastroenterology

## 2019-03-07 DIAGNOSIS — Z20822 Contact with and (suspected) exposure to covid-19: Secondary | ICD-10-CM

## 2019-03-07 NOTE — Telephone Encounter (Signed)
Patient called back,  and the testing site said the COVID results will take 3-5 days. His work is requiring him to quarantine until the results come back. Will cancel the 2 appts. this week

## 2019-03-07 NOTE — Telephone Encounter (Signed)
Called and left a message on Jennifer's voice mail to please cancel patient's IR procedure on 03/09/19 because of COVID exposure and quarantine. Left phone # to call back if any questions.

## 2019-03-07 NOTE — Telephone Encounter (Signed)
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 He is going to get a COVID test. Patient has a liver biopsy on 12/9 that needs to be rescheduled. also has hep injec and a f/u with Dr. Havery Moros scheduled. He also asked if someone would reach back out to him.

## 2019-03-07 NOTE — Telephone Encounter (Signed)
Spoke to pt. And he is in line to get tested right now. He will call us back as soon as he is done and let us know when the results are to come back. Rapid test or not.

## 2019-03-08 NOTE — Telephone Encounter (Signed)
Okay thanks, we can reschedule once he has finished quarantine

## 2019-03-09 ENCOUNTER — Telehealth: Payer: Self-pay | Admitting: Gastroenterology

## 2019-03-09 ENCOUNTER — Ambulatory Visit (HOSPITAL_COMMUNITY): Admission: RE | Admit: 2019-03-09 | Payer: BC Managed Care – PPO | Source: Ambulatory Visit

## 2019-03-09 LAB — NOVEL CORONAVIRUS, NAA: SARS-CoV-2, NAA: NOT DETECTED

## 2019-03-09 NOTE — Telephone Encounter (Signed)
Pt stated that he tested negative for COVID and would like to reschedule Hep inj and liver biopsy.

## 2019-03-11 ENCOUNTER — Telehealth: Payer: Self-pay | Admitting: Gastroenterology

## 2019-03-11 NOTE — Telephone Encounter (Signed)
Called and left message on Jennifer's voice mail in IR that patient wants to reschedule his liver biopsies with IR, if she could call me back or call patient directly, like she did before.

## 2019-03-21 ENCOUNTER — Other Ambulatory Visit: Payer: Self-pay

## 2019-03-21 ENCOUNTER — Encounter: Payer: Self-pay | Admitting: Skilled Nursing Facility1

## 2019-03-21 ENCOUNTER — Encounter: Payer: BC Managed Care – PPO | Attending: Gastroenterology | Admitting: Skilled Nursing Facility1

## 2019-03-21 DIAGNOSIS — K900 Celiac disease: Secondary | ICD-10-CM | POA: Insufficient documentation

## 2019-03-21 NOTE — Patient Instructions (Addendum)
Gluten Free Multivitamins   Centrum GLUTEN-FREE Men's One-A-Day  Vitafusion GLUTEN FREE Chewable Calcium with Vitamin D   Country Life Brand  Garden of Life Brand  Life Extension Brand  Goals:  1) Talk to your physician about adjusting the timing of your Lasix to relieve night time urination.  2) Monitor weight change.  3) Consider a gluten-free fiber supplement to alleviate diarrhea.  4) Have a steak! Pair it with some potatoes and broccoli!  5) Pay attention to the foods you eat when you have symptoms (diarrhea, bloating, cramping).  6) Be aware of cross-contamination when cooking and eating out (See handout, Celiac Cooking Tips p.2)  7) Have all the vegetables!!

## 2019-03-21 NOTE — Progress Notes (Signed)
Medical Nutrition Therapy:  Appt start time: 7829 end time:  5621.  Pt presented slight wasting around the occipital area. Pt reports getting stopped up or bloated when eating beef and cheese quesadilla. [place] did not cause any distress but then when ate a quesadilla after that he did have symptoms. Pt reports avoiding fried foods, has noticed a difference with symptoms when he eats them. Pt reports bloating and cramping when he eats foods he knows "he's not supposed to". Pt reports that he is unfamiliar with Celiac disease, has done some research. Pt reports he has had diarrhea his whole life (may be symptoms of Celiac disease, still does on a regular basis. Pt reports dairy gives bad diarrhea (lactose intolerance). Pt can tolerate yogurt. Pt reported not eating for a week, roughly two months ago and then was prescribed lasix. Pt reported appetite returned once he passed the water. Pt reports he likes all fruits and veggies. His favorite is cauliflower. Pt reports his wife is a strong support system. Reports she is helping him eat and doing a lot of research on celiac disease (dietitian advised pt his wife can call or email if she has any questions). 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 Recommend Pt talk to his physician about changing his lasix timing. Pt verbally expressed his being upset at never being able to eat anything with gluten: dietitian sympathized with pt. Pt states his new wife is on it and reading a lot about it.   Body Composition Scale 03/21/2019  Total Body Fat % 17.0  Visceral Fat 11  Fat-Free Mass % 82.9   Total Body Water % 63.9   Muscle-Mass lbs 37.1  Body Fat Displacement          Torso  lbs 20.8         Left Leg  lbs 4.1         Right Leg  lbs 4.1         Left Arm  lbs  2.0         Right Arm   lbs 2.0     Assessment:  Primary concerns today: Celiac Disease.  MEDICATIONS: See list   DIETARY INTAKE:  Usual eating pattern includes 3 meals and 1 snacks per day.  Everyday foods include none stated.  Avoided foods include milk.    24-hr recall:  B ( AM): 2 mixed fruit cups  Snk ( AM):  L ( PM): Honey Bun Snk ( PM):  D ( PM): frozen cauliflower pizza  Snk ( PM):  Beverages: Dr Malachi Bonds, Life water (blueberry pomegranite), "a lot of water"  Usual physical activity: ADLs. Drives a truck for a living   Nutritional Diagnosis:  NI-5.11.1 Predicted suboptimal nutrient intake As related to celiac disease.  As evidenced by consistant diarrhea and sunken eyes.    Intervention:  Nutrition Education. Dietitian educated pt on celiac disease and how/what to eat to avoid GI damage. Goals: Do not eat anything with gluten EVER; you can have no amount of gluten; 1 slice of regular bread can still cause damage even without you feeling it in the moment Look for "GF" on the label Use the handouts to help you identify other terms for gluten on the nutrition facts label Talk with your dcotor  about options for your lasix; do not let the amount of times you have to urinate keep you from drinking enough water throughout the day Dr pepper is GF but limit due to sugar content Eat nutrient dense foods such as fruits and vegetables multiple times a day every day  Try metamucil for a fiber supplement to help with diarrhea  Flour Tortillas have gluten  Teaching Method Utilized:  Visual Auditory Hands on  Handouts given during visit include:  Celiac Label Reading Tips (NCM)  Celiac Healthy Eating Tips (NCM)  Celiac Shopping Tips (NCM)  Gluten-Free Nutrition Therapy (NCM)  Celiac Cooking Tips (NCM)  Barriers to learning/adherence to lifestyle change: none identified   Demonstrated degree of understanding via:  Teach Back   Monitoring/Evaluation:  Dietary intake,  exercise, , and body weight 1 month

## 2019-03-22 ENCOUNTER — Other Ambulatory Visit: Payer: Self-pay | Admitting: Radiology

## 2019-03-23 ENCOUNTER — Ambulatory Visit (HOSPITAL_COMMUNITY): Admission: RE | Admit: 2019-03-23 | Payer: BC Managed Care – PPO | Source: Ambulatory Visit

## 2019-03-29 ENCOUNTER — Ambulatory Visit: Payer: BC Managed Care – PPO | Admitting: Gastroenterology

## 2019-04-11 ENCOUNTER — Ambulatory Visit
Admission: EM | Admit: 2019-04-11 | Discharge: 2019-04-11 | Disposition: A | Discharge status: Home / Self Care | Payer: BC Managed Care – PPO | Attending: Emergency Medicine | Admitting: Emergency Medicine

## 2019-04-11 ENCOUNTER — Other Ambulatory Visit: Payer: Self-pay

## 2019-04-11 ENCOUNTER — Encounter: Payer: Self-pay | Admitting: Emergency Medicine

## 2019-04-11 DIAGNOSIS — Z0189 Encounter for other specified special examinations: Secondary | ICD-10-CM | POA: Diagnosis not present

## 2019-04-11 DIAGNOSIS — R0981 Nasal congestion: Secondary | ICD-10-CM | POA: Diagnosis not present

## 2019-04-11 HISTORY — DX: Celiac disease: K90.0

## 2019-04-11 NOTE — Discharge Instructions (Addendum)
Your COVID test is pending.  You should self quarantine until your test result is back and is negative.    Take Tylenol as needed for fever or discomfort.  Rest and keep yourself hydrated with 8-10 glasses of water each day.    Go to the emergency department if you develop high fever, shortness of breath, severe diarrhea, or other concerning symptoms.

## 2019-04-11 NOTE — ED Provider Notes (Signed)
[name]    CSN: [id number] Arrival date & time: 04/11/19  1036      History   Chief Complaint Chief Complaint  Patient presents with  . covid testing  . Nasal Congestion    HPI Shawn Martin is a 44 y.o. male.   Patient presents with fatigue, nasal congestion, and runny nose x2 days.  He requests a COVID test today.  Denies fever, chills, cough, shortness of breath, vomiting, diarrhea, or other symptoms.  No treatments attempted at home.  The history is provided by the patient.    Past Medical History:  Diagnosis Date  . Celiac disease   . Hepatic cirrhosis ([place])   . Hypertension     Patient Active Problem List   Diagnosis Date Noted  . Other ascites 01/28/2019  . Alcoholic cirrhosis of liver with ascites ([place]) 01/28/2019  . Screening for viral disease 01/28/2019    Past Surgical History:  Procedure Laterality Date  . IR PARACENTESIS  02/07/2019  . NOSE SURGERY         Home Medications    Prior to Admission medications   Medication Sig Start Date End Date Taking? Authorizing Provider  furosemide (LASIX) 20 MG tablet Take 1 tablet (20 mg total) by mouth daily. 01/28/19  Yes Zehr, Laban Emperor, PA-C  propranolol (INDERAL) 10 MG tablet Take 1 tablet (10 mg total) by mouth 2 (two) times daily. 02/08/19  Yes Armbruster, [name], MD  spironolactone (ALDACTONE) 50 MG tablet Take 1 tablet (50 mg total) by mouth daily. 01/28/19  Yes Zehr, Laban Emperor, PA-C    Family History Family History  Problem Relation Age of Onset  . Healthy Mother   . Hypertension Father   . Leukemia Brother   . Colon cancer Neg Hx   . Stomach cancer Neg Hx   . Pancreatic cancer Neg Hx     Social History Social History   Tobacco Use  . Smoking status: Never Smoker  . Smokeless tobacco: Former Systems developer    Types: Chew  Substance Use Topics  . Alcohol use: Not Currently    Comment: every day drinker, 6 pack per day  . Drug use: Never     Allergies   Gluten  meal   Review of Systems Review of Systems  Constitutional: Negative for chills and fever.  HENT: Positive for congestion and rhinorrhea. Negative for ear pain and sore throat.   Eyes: Negative for pain and visual disturbance.  Respiratory: Negative for cough and shortness of breath.   Cardiovascular: Negative for chest pain and palpitations.  Gastrointestinal: Negative for abdominal pain, diarrhea, nausea and vomiting.  Genitourinary: Negative for dysuria and hematuria.  Musculoskeletal: Negative for arthralgias and back pain.  Skin: Negative for color change and rash.  Neurological: Negative for seizures and syncope.  All other systems reviewed and are negative.    Physical Exam Triage Vital Signs ED Triage Vitals  Enc Vitals Group     BP      Pulse      Resp      Temp      Temp src      SpO2      Weight      Height      Head Circumference      Peak Flow      Pain Score      Pain Loc      Pain Edu?      Excl. in [place]?    No data  found.  Updated Vital Signs BP 117/84 (BP Location: Left Arm)   Pulse 93   Temp 98.3 F (36.8 C) (Oral)   Resp 18   Ht 6' (1.829 m)   Wt 207 lb (93.9 kg)   SpO2 98%   BMI 28.07 kg/m   Visual Acuity Right Eye Distance:   Left Eye Distance:   Bilateral Distance:    Right Eye Near:   Left Eye Near:    Bilateral Near:     Physical Exam Vitals and nursing note reviewed.  Constitutional:      General: He is not in acute distress.    Appearance: He is well-developed.  HENT:     Head: Normocephalic and atraumatic.     Right Ear: Tympanic membrane normal.     Left Ear: Tympanic membrane normal.     Nose: Congestion and rhinorrhea present.     Mouth/Throat:     Mouth: Mucous membranes are moist.     Pharynx: Oropharynx is clear.  Eyes:     Conjunctiva/sclera: Conjunctivae normal.  Cardiovascular:     Rate and Rhythm: Normal rate and regular rhythm.     Heart sounds: No murmur.  Pulmonary:     Effort: Pulmonary effort is  normal. No respiratory distress.     Breath sounds: Normal breath sounds.  Abdominal:     General: Bowel sounds are normal.     Palpations: Abdomen is soft.     Tenderness: There is no abdominal tenderness. There is no guarding or rebound.  Musculoskeletal:     Cervical back: Neck supple.  Skin:    General: Skin is warm and dry.     Findings: No rash.  Neurological:     General: No focal deficit present.     Mental Status: He is alert and oriented to person, place, and time.  Psychiatric:        Mood and Affect: Mood normal.        Behavior: Behavior normal.      UC Treatments / Results  Labs (all labs ordered are listed, but only abnormal results are displayed) Labs Reviewed  NOVEL CORONAVIRUS, NAA    EKG   Radiology No results found.  Procedures Procedures (including critical care time)  Medications Ordered in UC Medications - No data to display  Initial Impression / Assessment and Plan / UC Course  I have reviewed the triage vital signs and the nursing notes.  Pertinent labs & imaging results that were available during my care of the patient were reviewed by me and considered in my medical decision making (see chart for details).    Nasal congestion, patient request for COVID test.  COVID test performed here.  Instructed patient to self quarantine until the test result is back.  Discussed with patient that he can take Tylenol as needed for fever or discomfort; Mucinex as needed for congestion.  Instructed patient to go to the emergency department if he develops high fever, shortness of breath, severe diarrhea, or other concerning symptoms.  Patient agrees with plan of care.   Final Clinical Impressions(s) / UC Diagnoses   Final diagnoses:  Nasal congestion  Patient request for diagnostic testing     Discharge Instructions     Your COVID test is pending.  You should self quarantine until your test result is back and is negative.    Take Tylenol as needed  for fever or discomfort.  Rest and keep yourself hydrated with 8-10 glasses of water each  day.    Go to the emergency department if you develop high fever, shortness of breath, severe diarrhea, or other concerning symptoms.       ED Prescriptions    None     PDMP not reviewed this encounter.   Sharion Balloon, NP 04/11/19 1132

## 2019-04-11 NOTE — ED Triage Notes (Signed)
Patient in today requesting covid testing. Patient states that he has had some nasal congestion and not feeling completely well x 2 days. Patient does has Celiac disease.

## 2019-04-13 LAB — NOVEL CORONAVIRUS, NAA: SARS-CoV-2, NAA: NOT DETECTED

## 2019-04-18 ENCOUNTER — Other Ambulatory Visit: Payer: Self-pay

## 2019-04-18 ENCOUNTER — Ambulatory Visit
Admission: EM | Admit: 2019-04-18 | Discharge: 2019-04-18 | Disposition: A | Discharge status: Home / Self Care | Payer: BC Managed Care – PPO | Attending: Emergency Medicine | Admitting: Emergency Medicine

## 2019-04-18 ENCOUNTER — Encounter: Payer: Self-pay | Admitting: Emergency Medicine

## 2019-04-18 DIAGNOSIS — J0100 Acute maxillary sinusitis, unspecified: Secondary | ICD-10-CM

## 2019-04-18 MED ORDER — AMOXICILLIN-POT CLAVULANATE 875-125 MG PO TABS: 1.0000 | ORAL_TABLET | Freq: Two times a day (BID) | ORAL | 0 refills | Status: DC

## 2019-04-18 NOTE — Discharge Instructions (Signed)
Take the Augmentin as directed.    Continue to take Tylenol as needed for fever or discomfort and Mucinex as needed for congestion.    Follow up with your primary care provider if your symptoms are not improving.

## 2019-04-18 NOTE — ED Provider Notes (Signed)
Shawn Martin    CSN: [id number] Arrival date & time: 04/18/19  1007      History   Chief Complaint Chief Complaint  Patient presents with  . Sinus Problem    HPI Shawn Martin is a 44 y.o. male.   Patient presents with sinus congestion, sinus pressure, rhinorrhea, postnasal drip, fatigue, nonproductive cough x9 days; worse for the past 2 days.  He was seen here on 04/11/2019; treated symptomatically; COVID test negative at that time.  He denies fever, chills, sore throat, shortness of breath, vomiting, diarrhea, rash, or other symptoms.  Treatment attempted at home with Tylenol and Mucinex.  The history is provided by the patient.    Past Medical History:  Diagnosis Date  . Celiac disease   . Hepatic cirrhosis ([place])   . Hypertension     Patient Active Problem List   Diagnosis Date Noted  . Other ascites 01/28/2019  . Alcoholic cirrhosis of liver with ascites ([place]) 01/28/2019  . Screening for viral disease 01/28/2019    Past Surgical History:  Procedure Laterality Date  . IR PARACENTESIS  02/07/2019  . NOSE SURGERY         Home Medications    Prior to Admission medications   Medication Sig Start Date End Date Taking? Authorizing Provider  furosemide (LASIX) 20 MG tablet Take 1 tablet (20 mg total) by mouth daily. 01/28/19  Yes Zehr, Laban Emperor, PA-C  propranolol (INDERAL) 10 MG tablet Take 1 tablet (10 mg total) by mouth 2 (two) times daily. 02/08/19  Yes Armbruster, [name], MD  spironolactone (ALDACTONE) 50 MG tablet Take 1 tablet (50 mg total) by mouth daily. 01/28/19  Yes Zehr, Laban Emperor, PA-C  amoxicillin-clavulanate (AUGMENTIN) 875-125 MG tablet Take 1 tablet by mouth every 12 (twelve) hours. 04/18/19   Sharion Balloon, NP    Family History Family History  Problem Relation Age of Onset  . Healthy Mother   . Hypertension Father   . Leukemia Brother   . Colon cancer Neg Hx   . Stomach cancer Neg Hx   . Pancreatic cancer Neg Hx     Social  History Social History   Tobacco Use  . Smoking status: Never Smoker  . Smokeless tobacco: Former Systems developer    Types: Chew  Substance Use Topics  . Alcohol use: Not Currently    Comment: every day drinker, 6 pack per day  . Drug use: Never     Allergies   Gluten meal   Review of Systems Review of Systems  Constitutional: Positive for fatigue. Negative for chills and fever.  HENT: Positive for congestion, postnasal drip, rhinorrhea and sinus pressure. Negative for ear pain and sore throat.   Eyes: Negative for pain and visual disturbance.  Respiratory: Positive for cough. Negative for shortness of breath.   Cardiovascular: Negative for chest pain and palpitations.  Gastrointestinal: Negative for abdominal pain and vomiting.  Genitourinary: Negative for dysuria and hematuria.  Musculoskeletal: Negative for arthralgias and back pain.  Skin: Negative for color change and rash.  Neurological: Negative for seizures and syncope.  All other systems reviewed and are negative.    Physical Exam Triage Vital Signs ED Triage Vitals  Enc Vitals Group     BP      Pulse      Resp      Temp      Temp src      SpO2      Weight      Height  Head Circumference      Peak Flow      Pain Score      Pain Loc      Pain Edu?      Excl. in [place]?    No data found.  Updated Vital Signs BP 122/82 (BP Location: Left Arm)   Pulse (!) 112   Temp 98.8 F (37.1 C) (Oral)   Resp 18   Ht 6' (1.829 m)   Wt 207 lb (93.9 kg)   SpO2 97%   BMI 28.07 kg/m   Visual Acuity Right Eye Distance:   Left Eye Distance:   Bilateral Distance:    Right Eye Near:   Left Eye Near:    Bilateral Near:     Physical Exam Vitals and nursing note reviewed.  Constitutional:      General: He is not in acute distress.    Appearance: He is well-developed.  HENT:     Head: Normocephalic and atraumatic.     Right Ear: Tympanic membrane normal.     Left Ear: Tympanic membrane normal.     Nose:  Congestion present.     Mouth/Throat:     Mouth: Mucous membranes are moist.     Pharynx: Oropharynx is clear.  Eyes:     Conjunctiva/sclera: Conjunctivae normal.  Cardiovascular:     Rate and Rhythm: Normal rate and regular rhythm.     Heart sounds: No murmur.  Pulmonary:     Effort: Pulmonary effort is normal. No respiratory distress.     Breath sounds: Normal breath sounds. No wheezing or rhonchi.  Abdominal:     General: Bowel sounds are normal.     Palpations: Abdomen is soft.     Tenderness: There is no abdominal tenderness. There is no guarding or rebound.  Musculoskeletal:     Cervical back: Neck supple.  Skin:    General: Skin is warm and dry.     Findings: No rash.  Neurological:     General: No focal deficit present.     Mental Status: He is alert and oriented to person, place, and time.  Psychiatric:        Mood and Affect: Mood normal.        Behavior: Behavior normal.      UC Treatments / Results  Labs (all labs ordered are listed, but only abnormal results are displayed) Labs Reviewed - No data to display  EKG   Radiology No results found.  Procedures Procedures (including critical care time)  Medications Ordered in UC Medications - No data to display  Initial Impression / Assessment and Plan / UC Course  I have reviewed the triage vital signs and the nursing notes.  Pertinent labs & imaging results that were available during my care of the patient were reviewed by me and considered in my medical decision making (see chart for details).    Acute maxillary sinusitis.  Patient tested negative for COVID on 04/11/2019.  Treating with Augmentin.  Instructed patient to continue Tylenol and Mucinex as needed.  Instructed him to follow-up with his PCP if his symptoms are not improving.  Patient agrees to plan of care.      Final Clinical Impressions(s) / UC Diagnoses   Final diagnoses:  Acute non-recurrent maxillary sinusitis     Discharge  Instructions     Take the Augmentin as directed.    Continue to take Tylenol as needed for fever or discomfort and Mucinex as needed for congestion.  Follow up with your primary care provider if your symptoms are not improving.        ED Prescriptions    Medication Sig Dispense Auth. Provider   amoxicillin-clavulanate (AUGMENTIN) 875-125 MG tablet Take 1 tablet by mouth every 12 (twelve) hours. 14 tablet Sharion Balloon, NP     PDMP not reviewed this encounter.   Sharion Balloon, NP 04/18/19 1032

## 2019-04-18 NOTE — ED Triage Notes (Signed)
Patient in today c/o continued sinus problems x 9 days. Patient seen 04/11/19 for same. Patient states sinus pressure/pain got worse yesterday. Patient was tested for Covid on 04/11/19 and was negative.

## 2019-04-19 ENCOUNTER — Other Ambulatory Visit: Payer: Self-pay | Admitting: Gastroenterology

## 2019-04-19 NOTE — Telephone Encounter (Signed)
Pt returned your call.  

## 2019-04-19 NOTE — Telephone Encounter (Signed)
Called patient back and he said someone called his wife today, but he didn't know who. I don't see any phone note. I asked him to please check with his wife to find  Out more information, if he could. I had left a message 1 month ago about scheduling a live biopsy. He said he just changed Ins. And will not have coverage for 60 days

## 2019-04-19 NOTE — Telephone Encounter (Signed)
Called patient to remind him he is past due on labwork done but there was no way to leave a message as VM had not been set up.  OK to refill spironolactone? Please advise.

## 2019-04-19 NOTE — Telephone Encounter (Signed)
Hold off on refilling for now.  Can you try to contact his wife instead?  Looks like he was supposed to have a liver biopsy and stuff that he has not proceeded with either...  Thank you,  Jess

## 2019-04-19 NOTE — Telephone Encounter (Signed)
Called and LM for patient's wife that we need to speak to him regarding blood work, (past due for BMET), need to reschedule liver biopsy and need to discuss refill request for Aldactone.

## 2019-04-20 ENCOUNTER — Other Ambulatory Visit: Payer: Self-pay | Admitting: Gastroenterology

## 2019-04-24 ENCOUNTER — Other Ambulatory Visit: Payer: Self-pay

## 2019-04-24 ENCOUNTER — Emergency Department
Admission: EM | Admit: 2019-04-24 | Discharge: 2019-04-25 | Disposition: A | Discharge status: Home / Self Care | Payer: BC Managed Care – PPO | Attending: Emergency Medicine | Admitting: Emergency Medicine

## 2019-04-24 DIAGNOSIS — I10 Essential (primary) hypertension: Secondary | ICD-10-CM | POA: Insufficient documentation

## 2019-04-24 DIAGNOSIS — F10129 Alcohol abuse with intoxication, unspecified: Secondary | ICD-10-CM | POA: Insufficient documentation

## 2019-04-24 DIAGNOSIS — Y908 Blood alcohol level of 240 mg/100 ml or more: Secondary | ICD-10-CM | POA: Insufficient documentation

## 2019-04-24 DIAGNOSIS — Z79899 Other long term (current) drug therapy: Secondary | ICD-10-CM | POA: Insufficient documentation

## 2019-04-24 DIAGNOSIS — K7030 Alcoholic cirrhosis of liver without ascites: Secondary | ICD-10-CM

## 2019-04-24 DIAGNOSIS — F10929 Alcohol use, unspecified with intoxication, unspecified: Secondary | ICD-10-CM

## 2019-04-24 LAB — COMPREHENSIVE METABOLIC PANEL
ALT: 57 U/L — ABNORMAL HIGH (ref 0–44)
AST: 204 U/L — ABNORMAL HIGH (ref 15–41)
Albumin: 2.3 g/dL — ABNORMAL LOW (ref 3.5–5.0)
Alkaline Phosphatase: 176 U/L — ABNORMAL HIGH (ref 38–126)
Anion gap: 10 (ref 5–15)
BUN: 8 mg/dL (ref 6–20)
CO2: 30 mmol/L (ref 22–32)
Calcium: 7.7 mg/dL — ABNORMAL LOW (ref 8.9–10.3)
Chloride: 100 mmol/L (ref 98–111)
Creatinine, Ser: 0.74 mg/dL (ref 0.61–1.24)
GFR calc Af Amer: >60 mL/min (ref >60)
GFR calc non Af Amer: >60 mL/min (ref >60)
Glucose, Bld: 122 mg/dL — ABNORMAL HIGH (ref 70–99)
Potassium: 3 mmol/L — ABNORMAL LOW (ref 3.5–5.1)
Sodium: 140 mmol/L (ref 135–145)
Total Bilirubin: 1 mg/dL (ref 0.3–1.2)
Total Protein: 7.6 g/dL (ref 6.5–8.1)

## 2019-04-24 LAB — CBC WITH DIFFERENTIAL/PLATELET
Abs Immature Granulocytes: 0.01 10*3/uL (ref 0.00–0.07)
Basophils Absolute: 0 10*3/uL (ref 0.0–0.1)
Basophils Relative: 1 %
Eosinophils Absolute: 0.1 10*3/uL (ref 0.0–0.5)
Eosinophils Relative: 3 %
HCT: 41.1 % (ref 39.0–52.0)
Hemoglobin: 14.2 g/dL (ref 13.0–17.0)
Immature Granulocytes: 0 %
Lymphocytes Relative: 52 %
Lymphs Abs: 2.3 10*3/uL (ref 0.7–4.0)
MCH: 33.3 pg (ref 26.0–34.0)
MCHC: 34.5 g/dL (ref 30.0–36.0)
MCV: 96.5 fL (ref 80.0–100.0)
Monocytes Absolute: 0.3 10*3/uL (ref 0.1–1.0)
Monocytes Relative: 6 %
Neutro Abs: 1.7 10*3/uL (ref 1.7–7.7)
Neutrophils Relative %: 38 %
Platelets: 141 10*3/uL — ABNORMAL LOW (ref 150–400)
RBC: 4.26 MIL/uL (ref 4.22–5.81)
RDW: 14.3 % (ref 11.5–15.5)
WBC: 4.4 10*3/uL (ref 4.0–10.5)
nRBC: 0 % (ref 0.0–0.2)

## 2019-04-24 LAB — ETHANOL: Alcohol, Ethyl (B): 528 mg/dL (ref <10)

## 2019-04-24 LAB — ACETAMINOPHEN LEVEL: Acetaminophen (Tylenol), Serum: <10 ug/mL — ABNORMAL LOW (ref 10–30)

## 2019-04-24 LAB — SALICYLATE LEVEL: Salicylate Lvl: <7 mg/dL — ABNORMAL LOW (ref 7.0–30.0)

## 2019-04-24 LAB — AMMONIA: Ammonia: 29 umol/L (ref 9–35)

## 2019-04-24 NOTE — ED Triage Notes (Signed)
Pt to the er via [facility] for altered mental status possibly r/t etoh. Pt was found on the couch this evening with a empty pint of [place] whiskey. Pt was at baseline today and in and out of outbuilding doing yard work, Blood sugar 137, pt will localize pain. No trauma. Pt grips equal and pt able to lift lower extremities.

## 2019-04-24 NOTE — ED Notes (Signed)
Pt becoming more responsive since triage - now says full name, pt with moderate eye contact, and follows simple commands: equal hand strength  Per EMS sig other had suspicion for ETOH abuse once pt became unresponsive and found an "empty bottle of [place] bourbon"

## 2019-04-24 NOTE — ED Notes (Signed)
O2 2 lpm [place] added for sats @ 88%

## 2019-04-24 NOTE — ED Provider Notes (Signed)
[facility] Emergency Department Provider Note  ____________________________________________   First MD Initiated Contact with Patient 04/24/19 2311     (approximate)  I have reviewed the triage vital signs and the nursing notes.   HISTORY  Chief Complaint Altered Mental Status    HPI Shawn Martin is a 44 y.o. male with below list of previous medical conditions including hepatic cirrhosis hypertension and celiac disease presents to the emergency department via EMS secondary to altered mental status.  EMS states that the patient's family was concerned about the possibly of EtOH ingestion when he "began acting abnormal tonight.  EMS states that the family found an empty bottle of [place] bourbon.       Past Medical History:  Diagnosis Date  . Celiac disease   . Hepatic cirrhosis ([place])   . Hypertension     Patient Active Problem List   Diagnosis Date Noted  . Other ascites 01/28/2019  . Alcoholic cirrhosis of liver with ascites ([place]) 01/28/2019  . Screening for viral disease 01/28/2019    Past Surgical History:  Procedure Laterality Date  . IR PARACENTESIS  02/07/2019  . NOSE SURGERY      Prior to Admission medications   Medication Sig Start Date End Date Taking? Authorizing Provider  amoxicillin-clavulanate (AUGMENTIN) 875-125 MG tablet Take 1 tablet by mouth every 12 (twelve) hours. 04/18/19   Sharion Balloon, NP  furosemide (LASIX) 20 MG tablet TAKE 1 TABLET BY MOUTH EVERY DAY 04/20/19   Zehr, Laban Emperor, PA-C  propranolol (INDERAL) 10 MG tablet Take 1 tablet (10 mg total) by mouth 2 (two) times daily. 02/08/19   Armbruster, [name], MD  spironolactone (ALDACTONE) 50 MG tablet Take 1 tablet (50 mg total) by mouth daily. 01/28/19   Zehr, Janett Billow D, PA-C    Allergies Gluten meal  Family History  Problem Relation Age of Onset  . Healthy Mother   . Hypertension Father   . Leukemia Brother   . Colon cancer Neg Hx   . Stomach cancer Neg  Hx   . Pancreatic cancer Neg Hx     Social History Social History   Tobacco Use  . Smoking status: Never Smoker  . Smokeless tobacco: Former Systems developer    Types: Chew  Substance Use Topics  . Alcohol use: Not Currently    Comment: every day drinker, 6 pack per day  . Drug use: Never    Review of Systems Constitutional: No fever/chills Eyes: No visual changes. ENT: No sore throat. Cardiovascular: Denies chest pain. Respiratory: Denies shortness of breath. Gastrointestinal: No abdominal pain.  No nausea, no vomiting.  No diarrhea.  No constipation. Genitourinary: Negative for dysuria. Musculoskeletal: Negative for neck pain.  Negative for back pain. Integumentary: Negative for rash. Neurological: Negative for headaches, focal weakness or numbness. Psychiatric: Positive for suspected EtOH ingestion.  ____________________________________________   PHYSICAL EXAM:  VITAL SIGNS: ED Triage Vitals  Enc Vitals Group     BP 04/24/19 2303 (!) 125/92     Pulse Rate 04/24/19 2303 95     Resp 04/24/19 2303 19     Temp 04/24/19 2303 97.8 F (36.6 C)     Temp Source 04/24/19 2303 Axillary     SpO2 04/24/19 2303 (!) 87 %     Weight 04/24/19 2305 93.9 kg (206 lb 15.8 oz)     Height 04/24/19 2305 1.829 m (6')     Head Circumference --      Peak Flow --  Pain Score 04/24/19 2303 Asleep     Pain Loc --      Pain Edu? --      Excl. in [place]? --     Constitutional: Alert and oriented to self only confused appears intoxicated Eyes: Conjunctivae are normal.  Head: Atraumatic Mouth/Throat: Patient is wearing a mask. Neck: No stridor.  No meningeal signs.   Cardiovascular: Normal rate, regular rhythm. Good peripheral circulation. Grossly normal heart sounds. Respiratory: Normal respiratory effort.  No retractions. Gastrointestinal: Soft and nontender. No distention.  Musculoskeletal: No lower extremity tenderness nor edema. No gross deformities of extremities. Neurologic:  Normal  speech and language. No gross focal neurologic deficits are appreciated.  Skin:  Skin is warm, dry and intact. Psychiatric: Mood and affect are normal. Speech and behavior are normal.  ____________________________________________   LABS (all labs ordered are listed, but only abnormal results are displayed)  Labs Reviewed  COMPREHENSIVE METABOLIC PANEL - Abnormal; Notable for the following components:      Result Value   Potassium 3.0 (*)    Glucose, Bld 122 (*)    Calcium 7.7 (*)    Albumin 2.3 (*)    AST 204 (*)    ALT 57 (*)    Alkaline Phosphatase 176 (*)    All other components within normal limits  CBC WITH DIFFERENTIAL/PLATELET - Abnormal; Notable for the following components:   Platelets 141 (*)    All other components within normal limits  ACETAMINOPHEN LEVEL - Abnormal; Notable for the following components:   Acetaminophen (Tylenol), Serum <10 (*)    All other components within normal limits  SALICYLATE LEVEL - Abnormal; Notable for the following components:   Salicylate Lvl <3.3 (*)    All other components within normal limits  ETHANOL - Abnormal; Notable for the following components:   Alcohol, Ethyl (B) 528 (*)    All other components within normal limits  AMMONIA   ____________________________________________  EKG  ED ECG REPORT I, [place] Maycel Riffe, the attending physician, personally viewed and interpreted this ECG.   Date: 04/26/2019  EKG Time: 9:57 PM  Rate: 86  Rhythm: Normal sinus rhythm  Axis: Normal  Intervals: Normal  ST&T Change: None    Procedures   ____________________________________________   INITIAL IMPRESSION / MDM / ASSESSMENT AND PLAN / ED COURSE  As part of my medical decision making, I reviewed the following data within the electronic MEDICAL RECORD NUMBER   44 year old male presented with above-stated history and physical exam concerning for acute alcohol intoxication without any evidence of trauma.  Patient's EtOH level of 528.   Patient hemodynamically stable and remained so during my care in the emergency department.  Patient will be in the the ED until clinically sober.  Patient's care transferred to Dr. Charna Archer  ____________________________________________  FINAL CLINICAL IMPRESSION(S) / ED DIAGNOSES  Final diagnoses:  Alcoholic intoxication with complication ([place])  Alcoholic cirrhosis, unspecified whether ascites present ([place])     MEDICATIONS GIVEN DURING THIS VISIT:  Medications - No data to display   ED Discharge Orders    None      *Please note:  Maribel Martin was evaluated in Emergency Department on 04/25/2019 for the symptoms described in the history of present illness. He was evaluated in the context of the global COVID-19 pandemic, which necessitated consideration that the patient might be at risk for infection with the SARS-CoV-2 virus that causes COVID-19. Institutional protocols and algorithms that pertain to the evaluation of patients at risk for COVID-19  are in a state of rapid change based on information released by regulatory bodies including the CDC and federal and state organizations. These policies and algorithms were followed during the patient's care in the ED.  Some ED evaluations and interventions may be delayed as a result of limited staffing during the pandemic.*  Note:  This document was prepared using Dragon voice recognition software and may include unintentional dictation errors.   Gregor Hams, MD 04/26/19 [phone]

## 2019-04-25 NOTE — ED Notes (Signed)
Pt given breakfast tray

## 2019-04-25 NOTE — ED Notes (Signed)
Pt informed that I called his wife and left message regarding his discharge. Pt given warm blanket and informed as soon as she arrives I will take him out to meet her.

## 2019-04-25 NOTE — ED Notes (Signed)
MD Jessup at bedside, pt updated on plan to go home. Will call wife at this time for pickup.

## 2019-04-25 NOTE — ED Notes (Signed)
Call from Crystal Martinique for update, discussion for pt possibly using ETOH on regular basis and withdrawal signs and symptoms

## 2019-04-25 NOTE — ED Notes (Addendum)
Call from family (Crystal Martinique, wife) updated as to patient's status after verifying pt identity as pt was too obtunded to give permission.  Wife reports hx of Celiac disease and cirrhosis   Pt returned to even and unlabored respirations and attempt to wake

## 2019-04-25 NOTE — ED Notes (Addendum)
CRITICAL LAB: ETOH is 528, [place], Lab, Dr. Owens Shark notified, orders received

## 2019-04-25 NOTE — ED Notes (Signed)
Attempted to call per pt's request at this time. Left message for wife to call back and speak with husband. Will inform pt at this time

## 2019-04-25 NOTE — ED Notes (Signed)
Pt informed of discharge instructions and has no questions at this time. Pt assisted out via wheelchair. Pt taken to wife waiting on her

## 2019-04-25 NOTE — ED Notes (Signed)
Spoke with wife of pt and updated her on pt's status and plan of  Care. Will check with MD to make sure plan is still same. Informed Crystal wife of pt that I would notified her of any new changes.

## 2019-04-25 NOTE — ED Provider Notes (Signed)
-----------------------------------------   7:08 AM on 04/25/2019 -----------------------------------------  Blood pressure (!) 137/98, pulse 77, temperature 97.8 F (36.6 C), temperature source Axillary, resp. rate 15, height 6' (1.829 m), weight 93.9 kg, SpO2 98 %.  Assuming care from Dr. Owens Shark.  In short, Shawn Martin is a 44 y.o. male with a chief complaint of Altered Mental Status .  Refer to the original H&P for additional details.  The current plan of care is to reevaluate once clinically sober, may be appropriate for discharge home at that time.  Patient now appears clinically sober and is able to obtain a safe ride home with his wife.  I have counseled patient to cut back on drinking and establish care with PCP, patient agrees with plan.    Blake Divine, MD 04/25/19 1014

## 2019-04-25 NOTE — ED Notes (Signed)
Pt woke to this RN entering room, pt oriented to location and situation - pt reports drinking prior to ED visit

## 2019-04-25 NOTE — ED Notes (Signed)
Called wife and left message stating that pt is ready for discharge.

## 2019-04-25 NOTE — ED Notes (Signed)
Pt on phone speaking with wife at this time.

## 2019-04-30 ENCOUNTER — Other Ambulatory Visit: Payer: Self-pay | Admitting: Gastroenterology

## 2019-05-16 ENCOUNTER — Other Ambulatory Visit: Payer: Self-pay | Admitting: Gastroenterology

## 2019-05-17 NOTE — Telephone Encounter (Signed)
Shawn Martin - this pt indicates he currently has no insurance so he does not want to have an appointment he can't pay for. He has had recent labs. Ok to refill? Please advise.

## 2019-05-18 NOTE — Telephone Encounter (Signed)
Sent MyChart message to pt that he needs to schedule an appt in order to continue to receive prescriptions. Shawn Martin sent a 1 month supply of aldactone and lasix to tide him over until his appt.

## 2019-05-18 NOTE — Telephone Encounter (Signed)
Patient really needs OV.  Looks like he is continuing to drink ETOH.  I am sure that is part of the reason that he does not want to come.  Ok to refill enough to get him to his appt.  Thank you,  Jess

## 2019-05-23 ENCOUNTER — Telehealth: Payer: Self-pay | Admitting: Gastroenterology

## 2019-05-23 ENCOUNTER — Ambulatory Visit: Payer: BC Managed Care – PPO | Admitting: Skilled Nursing Facility1

## 2019-05-23 NOTE — Telephone Encounter (Signed)
Pt stated that he has Celiac's disease and would like to discuss his issue of bloating.

## 2019-05-23 NOTE — Telephone Encounter (Signed)
Attempted to call pt and no answer and no voice mail set up.

## 2019-05-23 NOTE — Telephone Encounter (Signed)
The pt states he wanted to know who to speak with about insurance changes.  He has an appt tomorrow with Dr Havery Moros.  I advised him to provide the insurance information when he checks in for the appt tomorrow.  The pt has been advised of the information and verbalized understanding.

## 2019-05-24 ENCOUNTER — Other Ambulatory Visit: Payer: Self-pay

## 2019-05-24 ENCOUNTER — Encounter: Payer: Self-pay | Admitting: Gastroenterology

## 2019-05-24 ENCOUNTER — Other Ambulatory Visit (INDEPENDENT_AMBULATORY_CARE_PROVIDER_SITE_OTHER): Payer: Self-pay

## 2019-05-24 ENCOUNTER — Ambulatory Visit (INDEPENDENT_AMBULATORY_CARE_PROVIDER_SITE_OTHER): Payer: Self-pay | Admitting: Gastroenterology

## 2019-05-24 VITALS — BP 130/84 | HR 96 | Temp 98.9°F | Ht 70.25 in | Wt 231.4 lb

## 2019-05-24 DIAGNOSIS — Z23 Encounter for immunization: Secondary | ICD-10-CM

## 2019-05-24 DIAGNOSIS — K625 Hemorrhage of anus and rectum: Secondary | ICD-10-CM

## 2019-05-24 DIAGNOSIS — K7460 Unspecified cirrhosis of liver: Secondary | ICD-10-CM

## 2019-05-24 DIAGNOSIS — R188 Other ascites: Secondary | ICD-10-CM

## 2019-05-24 DIAGNOSIS — F1010 Alcohol abuse, uncomplicated: Secondary | ICD-10-CM

## 2019-05-24 DIAGNOSIS — R748 Abnormal levels of other serum enzymes: Secondary | ICD-10-CM

## 2019-05-24 DIAGNOSIS — R21 Rash and other nonspecific skin eruption: Secondary | ICD-10-CM

## 2019-05-24 DIAGNOSIS — I8500 Esophageal varices without bleeding: Secondary | ICD-10-CM

## 2019-05-24 DIAGNOSIS — K900 Celiac disease: Secondary | ICD-10-CM

## 2019-05-24 NOTE — Progress Notes (Signed)
HPI :  44 year old male here for follow-up visit for cirrhosis.  He is accompanied by his wife today.  He was previously seen by Alonza Bogus who set him up for an endoscopy with me since our last visit.  He has had a longstanding history of alcohol use historically, has drank hard alcohol, roughly 1 pint per day for several years.  He was diagnosed with cirrhosis and ascites in the past few months.  Since that time he has mostly stopped drinking although did have recurrence 3 weeks ago where he drank until he passed out was taken to the emergency room.  He states that is the only time he has drank since he found out he had cirrhosis.  He is decompensated with ascites and lower extremity edema.  He has had an upper endoscopy with me since her last visit which showed grade 2 varices.  He was placed on low-dose Aldactone 50 mg and Lasix 20 mg a day with plans to titrate up, as well as placed on low-dose propranolol in light of his ascites to treat the varices.  He had a full serologic work-up to rule out other causes of liver disease any had 3+ markers for autoimmune hepatitis.  Elevated IgG, markedly positive ANA, markedly positive smooth muscle antibody.  I had referred him for a transjugular liver biopsy previously, this was scheduled but he failed to follow through with it as his insurance changed and he could not afford it.  Otherwise complicating his course, on his EGD he had scalloping noted on his duodenum and biopsies were positive for celiac disease.  Labs were sent and his tissue transglutaminase is markedly positive.  He was referred to nutritionist and has been on a gluten free diet.  He is also on a low NA diet.  Wife states he has been compliant with his diet outside of his recent episode of drinking.  He has had worsening of ascites since have seen him in worsening of lower extremity edema.  He previously had a paracentesis in November which was negative for SBP and cytology.  SAAG greater  than 1.1.  He has had no bleeding from the varices.  Has no jaundice.  No history of hepatic encephalopathy.  We had a lengthy discussion about his plans and course as below.  Of note he is not immune to hepatitis A or B and has not followed up for vaccines for this.  In addition, he also notes intermittent rectal bleeding that has been occurring in recent weeks.  States bright red blood in the stools, no melena.  Has occurred about 3 times.  No constipation.  Has never had a prior colonoscopy  He otherwise complains of a rash on his lower extremities that is quite itchy.  Prior workup: EGD 02/08/19 -  - Grade II varices were found in the middle third of the esophagus and in the lower third of the esophagus. They were medium in size. No high risk stigmata for bleeding was noted. - The exam of the esophagus was otherwise normal. 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 - The exam of the stomach was otherwise normal. - Biopsies were taken with a cold forceps in the gastric body, at the incisura and in the gastric antrum for Helicobacter pylori testing. - Diffuse mucosal flattening with some scalloping was found in the second portion of the duodenum, perhaps due to portal hypertension. Biopsies for histology were taken with a  cold forceps for evaluation of celiac disease. - The exam of the duodenum was otherwise normal.   RUQ [place] 02/07/19 - IMPRESSION: 1. Cirrhosis. 2. Patent main portal vein with hepatopetal flow. 3. Ascites, increased since the prior ultrasound. 4. No gallstone. 5. Partially visualized right pleural effusion.         Past Medical History:  Diagnosis Date  . Celiac disease   . Hepatic cirrhosis ([place])   . Hypertension      Past Surgical History:  Procedure Laterality Date  . IR PARACENTESIS  02/07/2019  . NOSE SURGERY     Family History  Problem Relation Age of Onset  . Healthy Mother   . Hypertension  Father   . Leukemia Brother   . Colon cancer Neg Hx   . Stomach cancer Neg Hx   . Pancreatic cancer Neg Hx    Social History   Tobacco Use  . Smoking status: Never Smoker  . Smokeless tobacco: Former Systems developer    Types: Chew  Substance Use Topics  . Alcohol use: Not Currently    Comment: every day drinker, 6 pack per day  . Drug use: Never   Current Outpatient Medications  Medication Sig Dispense Refill  . furosemide (LASIX) 20 MG tablet TAKE 1 TABLET BY MOUTH EVERY DAY 30 tablet 0  . propranolol (INDERAL) 10 MG tablet TAKE 1 TABLET BY MOUTH TWICE DAILY 180 tablet 1  . spironolactone (ALDACTONE) 50 MG tablet TAKE 1 TABLET BY MOUTH EVERY DAY 30 tablet 0   No current facility-administered medications for this visit.   Allergies  Allergen Reactions  . Gluten Meal      Review of Systems: All systems reviewed and negative except where noted in HPI.   Lab Results  Component Value Date   ALT 57 (H) 04/24/2019   AST 204 (H) 04/24/2019   ALKPHOS 176 (H) 04/24/2019   BILITOT 1.0 04/24/2019    Lab Results  Component Value Date   CREATININE 0.74 04/24/2019   BUN 8 04/24/2019   NA 140 04/24/2019   K 3.0 (L) 04/24/2019   CL 100 04/24/2019   CO2 30 04/24/2019    Lab Results  Component Value Date   WBC 4.4 04/24/2019   HGB 14.2 04/24/2019   HCT 41.1 04/24/2019   MCV 96.5 04/24/2019   PLT 141 (L) 04/24/2019     Physical Exam: BP 130/84 (BP Location: Left Arm, Patient Position: Sitting, Cuff Size: Normal)   Pulse 96   Temp 98.9 F (37.2 C)   Ht 5' 10.25" (1.784 m) Comment: height measured without shoes  Wt 231 lb 6 oz (105 kg)   BMI 32.96 kg/m  Constitutional: Pleasant,well-developed, male in no acute distress. HEENT: Normocephalic and atraumatic. Conjunctivae are normal. No scleral icterus. Neck supple.  Cardiovascular: Normal rate, regular rhythm.  Pulmonary/chest: Effort normal and breath sounds normal.  Abdominal: Soft, distended, nontender. . There are no  masses palpable.  Extremities:(+) 2 -3 LE edema, eczema Lymphadenopathy: No cervical adenopathy noted. Neurological: Alert and oriented to person place and time. Skin: Skin is warm and dry. No rashes noted. Psychiatric: Normal mood and affect. Behavior is normal.   ASSESSMENT AND PLAN: 44 year old male here for reassessment of the following:  Cirrhosis with ascites / esophageal varices / abnormal liver enzymes / alcohol abuse: decompensated cirrhosis with ascites and esophageal varices.  We have suspected this is due to alcoholism however his additional work-up has revealed underlying celiac disease, and he also may have a component  of autoimmune hepatitis given his markedly positive ANA and smooth muscle antibody, as well as his elevated IgG.  I have recommended a liver biopsy to evaluate for the presence of autoimmune hepatitis, given his ascites it was recommended this be done via transjugular approach.  He was scheduled to have this done but his insurance changed and he will not be able to do this for the next 4 to 6 weeks.  Pending his liver enzymes are stable we will plan on pursuing that at that time and he is agreeable.  He very well may warrant an evaluation by hepatology if he has autoimmune hepatitis.  Otherwise I had a lengthy discussion with him and his wife about cirrhosis and risks of further decompensation and liver failure and HCC.  He absolutely must abstain from alcohol and should never drink again, we had a lengthy discussion about this any states he understands and is motivated to continue to abstain.  If he needs assistance with this or go through rehabilitation we can provide assistance.  Otherwise he needs to maintain a low-sodium diet.  I have not seen him in a while and he is due for follow-up be meant to check his renal function.  If this is stable will plan on increasing the doses of his diuretics.  Given his worsening ascites and lower extremity edema will need to hold the  propranolol for now.  He may need EGD at the hospital with banding of varices if we can get his ascites under control in the near future.  Given his ascites is tense I will send him to IR for paracentesis and check cell count to rule out SBP.  We will give albumin if more than 5 L removed.  I will need to keep an eye on his renal function closely every 2 weeks moving forward and adjust his diuretics accordingly.  I will be in touch with him closely over the next several weeks.  If he worsens I need to know about it.  He is agreeable to a liver biopsy in the next 4 to 6 weeks when he is able to.  I will recheck his INR to ensure stable as well as CBC and LFTs today.  Of note he is not vaccinated to hepatitis a and B and that was administered into him today.  Celiac disease - biopsy-proven celiac disease with markedly positive TTG.  Unclear if this is contributed to his presentation of cirrhosis at a young age as well.  He has been counseled on a gluten-free diet and has been compliant with it.  Will check TSH to ensure stable, he is also due for a DEXA scan.  I counseled him on celiac disease and he understands and will maintain gluten-free diet.  Rectal bleeding - a few episodes of rectal bleeding in recent weeks.  This is likely hemorrhoidal but he warrants a colonoscopy given his age.  He is agreeable to proceed with this however again due to insurance issues we will have to wait another 4 to 6 weeks.  He will be in touch with me about scheduling this in the upcoming weeks.  Eczema - what appears to be eczema on his lower extremities bilaterally.  Recommend daily moisturizer and he can use some topical hydrocortisone cream over-the-counter as needed.  If no improvement he should touch base with his PCP.  I spent 45 minutes of time, including in depth chart review, independent review of results as outlined above, communicating results with the patient  directly, face-to-face time with the patient,  coordinating care, ordering studies and medications as appropriate, and documenting this encounter.  [place] Cellar, MD [facility]

## 2019-05-24 NOTE — Patient Instructions (Addendum)
If you are age 46 or older, your body mass index should be between 23-30. Your Body mass index is 32.96 kg/m. If this is out of the aforementioned range listed, please consider follow up with your Primary Care Provider.  If you are age 25 or younger, your body mass index should be between 19-25. Your Body mass index is 32.96 kg/m. If this is out of the aformentioned range listed, please consider follow up with your Primary Care Provider.   Please go to the lab in the basement of our building to have lab work done as you leave today. Hit "B" for basement when you get on the elevator.  When the doors open the lab is on your left.  We will call you with the results. Thank you.  You have been scheduled for an abdominal paracentesis at [facility] radiology (1st floor of hospital) on Wednesday, 05-25-19 at 1:00pm. Please arrive at least 15 minutes prior to your appointment time for registration. Should you need to reschedule this appointment for any reason, please call our office at [phone].  You are scheduled for your 2nd Twinrix (Hep A and Hep B) injection on Tuesday, 06-21-19 at 2:00pm. Your 3rd and final injection will be in 6 months.    Please follow a Gluten-Free Diet.  We are giving you a handout today.  Please hold your propranolol until we contact you.  Thank you for entrusting me with your care and for choosing [facility], Dr. [place] Cellar

## 2019-05-25 ENCOUNTER — Telehealth: Payer: Self-pay | Admitting: Gastroenterology

## 2019-05-25 ENCOUNTER — Ambulatory Visit (HOSPITAL_COMMUNITY)
Admission: RE | Admit: 2019-05-25 | Discharge: 2019-05-25 | Disposition: A | Discharge status: Home / Self Care | Payer: Self-pay | Source: Ambulatory Visit | Attending: Gastroenterology | Admitting: Gastroenterology

## 2019-05-25 ENCOUNTER — Other Ambulatory Visit: Payer: Self-pay

## 2019-05-25 DIAGNOSIS — K625 Hemorrhage of anus and rectum: Secondary | ICD-10-CM | POA: Insufficient documentation

## 2019-05-25 DIAGNOSIS — K900 Celiac disease: Secondary | ICD-10-CM | POA: Insufficient documentation

## 2019-05-25 DIAGNOSIS — K7460 Unspecified cirrhosis of liver: Secondary | ICD-10-CM

## 2019-05-25 DIAGNOSIS — I8500 Esophageal varices without bleeding: Secondary | ICD-10-CM | POA: Insufficient documentation

## 2019-05-25 DIAGNOSIS — R748 Abnormal levels of other serum enzymes: Secondary | ICD-10-CM | POA: Insufficient documentation

## 2019-05-25 DIAGNOSIS — R188 Other ascites: Secondary | ICD-10-CM | POA: Insufficient documentation

## 2019-05-25 HISTORY — PX: IR PARACENTESIS: IMG2679

## 2019-05-25 LAB — PROTIME-INR
INR: 1.3 ratio — ABNORMAL HIGH (ref 0.8–1.0)
Prothrombin Time: 14.9 s — ABNORMAL HIGH (ref 9.6–13.1)

## 2019-05-25 LAB — CBC WITH DIFFERENTIAL/PLATELET
Basophils Absolute: 0.1 10*3/uL (ref 0.0–0.1)
Basophils Relative: 1.3 % (ref 0.0–3.0)
Eosinophils Absolute: 0.2 10*3/uL (ref 0.0–0.7)
Eosinophils Relative: 4.5 % (ref 0.0–5.0)
HCT: 31.6 % — ABNORMAL LOW (ref 39.0–52.0)
Hemoglobin: 10.7 g/dL — ABNORMAL LOW (ref 13.0–17.0)
Lymphocytes Relative: 25.2 % (ref 12.0–46.0)
Lymphs Abs: 1 10*3/uL (ref 0.7–4.0)
MCHC: 33.9 g/dL (ref 30.0–36.0)
MCV: 101.9 fl — ABNORMAL HIGH (ref 78.0–100.0)
Monocytes Absolute: 0.6 10*3/uL (ref 0.1–1.0)
Monocytes Relative: 14.9 % — ABNORMAL HIGH (ref 3.0–12.0)
Neutro Abs: 2.2 10*3/uL (ref 1.4–7.7)
Neutrophils Relative %: 54.1 % (ref 43.0–77.0)
Platelets: 152 10*3/uL (ref 150.0–400.0)
RBC: 3.1 Mil/uL — ABNORMAL LOW (ref 4.22–5.81)
RDW: 15.8 % — ABNORMAL HIGH (ref 11.5–15.5)
WBC: 4 10*3/uL (ref 4.0–10.5)

## 2019-05-25 LAB — BODY FLUID CELL COUNT WITH DIFFERENTIAL
Eos, Fluid: 2 %
Lymphs, Fluid: 47 %
Monocyte-Macrophage-Serous Fluid: 50 % (ref 50–90)
Neutrophil Count, Fluid: 0 % (ref 0–25)
Other Cells, Fluid: 1 %
Total Nucleated Cell Count, Fluid: 86 cu mm (ref 0–1000)

## 2019-05-25 LAB — COMPREHENSIVE METABOLIC PANEL
ALT: 54 U/L — ABNORMAL HIGH (ref 0–53)
AST: 127 U/L — ABNORMAL HIGH (ref 0–37)
Albumin: 2.3 g/dL — ABNORMAL LOW (ref 3.5–5.2)
Alkaline Phosphatase: 124 U/L — ABNORMAL HIGH (ref 39–117)
BUN: 11 mg/dL (ref 6–23)
CO2: 28 mEq/L (ref 19–32)
Calcium: 8.2 mg/dL — ABNORMAL LOW (ref 8.4–10.5)
Chloride: 93 mEq/L — ABNORMAL LOW (ref 96–112)
Creatinine, Ser: 0.85 mg/dL (ref 0.40–1.50)
GFR: 97.12 mL/min (ref >60.00)
Glucose, Bld: 115 mg/dL — ABNORMAL HIGH (ref 70–99)
Potassium: 3.8 mEq/L (ref 3.5–5.1)
Sodium: 130 mEq/L — ABNORMAL LOW (ref 135–145)
Total Bilirubin: 1.6 mg/dL — ABNORMAL HIGH (ref 0.2–1.2)
Total Protein: 6.8 g/dL (ref 6.0–8.3)

## 2019-05-25 LAB — TSH: TSH: 2.02 u[IU]/mL (ref 0.35–4.50)

## 2019-05-25 MED ORDER — ALBUMIN HUMAN 25 % IV SOLN
25.0000 g | Freq: Once | INTRAVENOUS | Status: AC
  Administered 2019-05-25: 25 g via INTRAVENOUS

## 2019-05-25 MED ORDER — ALBUMIN HUMAN 25 % IV SOLN
INTRAVENOUS | Status: AC
  Administered 2019-05-25: 50 g via INTRAVENOUS
  Filled 2019-05-25: qty 200

## 2019-05-25 MED ORDER — ALBUMIN HUMAN 25 % IV SOLN
50.0000 g | Freq: Once | INTRAVENOUS | Status: AC
  Filled 2019-05-25: qty 200

## 2019-05-25 MED ORDER — ALBUMIN HUMAN 25 % IV SOLN
INTRAVENOUS | Status: AC
  Filled 2019-05-25: qty 50

## 2019-05-25 MED ORDER — LIDOCAINE HCL (PF) 1 % IJ SOLN
INTRAMUSCULAR | Status: DC | PRN
  Administered 2019-05-25: 10 mL

## 2019-05-25 MED ORDER — LIDOCAINE HCL 1 % IJ SOLN
INTRAMUSCULAR | Status: AC
  Filled 2019-05-25: qty 20

## 2019-05-25 NOTE — Procedures (Signed)
PROCEDURE SUMMARY:  Successful image-guided paracentesis from the right lower abdomen.  Yielded 9.6 liters of hazy yellow fluid.  No immediate complications.  EBL: zero Patient tolerated well.   Specimen was sent for labs.  Please see imaging section of Epic for full dictation.  Joaquim Nam PA-C 05/25/2019 1:01 PM

## 2019-05-26 ENCOUNTER — Other Ambulatory Visit: Payer: Self-pay

## 2019-05-26 LAB — PATHOLOGIST SMEAR REVIEW

## 2019-05-26 MED ORDER — FUROSEMIDE 20 MG PO TABS: 20.0000 mg | ORAL_TABLET | Freq: Every day | ORAL | 3 refills | Status: DC

## 2019-05-26 MED ORDER — SPIRONOLACTONE 50 MG PO TABS: 50.0000 mg | ORAL_TABLET | Freq: Every day | ORAL | 3 refills | Status: DC

## 2019-05-26 NOTE — Telephone Encounter (Signed)
See result note.  

## 2019-06-02 ENCOUNTER — Telehealth: Payer: Self-pay | Admitting: Gastroenterology

## 2019-06-02 ENCOUNTER — Telehealth: Payer: Self-pay

## 2019-06-02 NOTE — Telephone Encounter (Signed)
Called patient back, no voice mail. Will try later

## 2019-06-03 ENCOUNTER — Other Ambulatory Visit: Payer: Self-pay

## 2019-06-03 ENCOUNTER — Encounter (HOSPITAL_COMMUNITY): Payer: Self-pay | Admitting: Emergency Medicine

## 2019-06-03 ENCOUNTER — Inpatient Hospital Stay (HOSPITAL_COMMUNITY)
Admission: EM | Admit: 2019-06-03 | Discharge: 2019-06-06 | DRG: 433 | Disposition: A | Discharge status: Home / Self Care | Payer: Self-pay | Attending: Pulmonary Disease | Admitting: Pulmonary Disease

## 2019-06-03 DIAGNOSIS — F329 Major depressive disorder, single episode, unspecified: Secondary | ICD-10-CM | POA: Diagnosis present

## 2019-06-03 DIAGNOSIS — R188 Other ascites: Secondary | ICD-10-CM

## 2019-06-03 DIAGNOSIS — I8510 Secondary esophageal varices without bleeding: Secondary | ICD-10-CM | POA: Diagnosis present

## 2019-06-03 DIAGNOSIS — K766 Portal hypertension: Secondary | ICD-10-CM | POA: Diagnosis present

## 2019-06-03 DIAGNOSIS — F1029 Alcohol dependence with unspecified alcohol-induced disorder: Secondary | ICD-10-CM

## 2019-06-03 DIAGNOSIS — Z79899 Other long term (current) drug therapy: Secondary | ICD-10-CM

## 2019-06-03 DIAGNOSIS — K7460 Unspecified cirrhosis of liver: Secondary | ICD-10-CM

## 2019-06-03 DIAGNOSIS — K900 Celiac disease: Secondary | ICD-10-CM | POA: Diagnosis present

## 2019-06-03 DIAGNOSIS — E8770 Fluid overload, unspecified: Secondary | ICD-10-CM | POA: Diagnosis present

## 2019-06-03 DIAGNOSIS — Z8249 Family history of ischemic heart disease and other diseases of the circulatory system: Secondary | ICD-10-CM

## 2019-06-03 DIAGNOSIS — E871 Hypo-osmolality and hyponatremia: Secondary | ICD-10-CM | POA: Diagnosis present

## 2019-06-03 DIAGNOSIS — L309 Dermatitis, unspecified: Secondary | ICD-10-CM | POA: Diagnosis present

## 2019-06-03 DIAGNOSIS — Z87891 Personal history of nicotine dependence: Secondary | ICD-10-CM

## 2019-06-03 DIAGNOSIS — K7031 Alcoholic cirrhosis of liver with ascites: Principal | ICD-10-CM | POA: Diagnosis present

## 2019-06-03 DIAGNOSIS — Z20822 Contact with and (suspected) exposure to covid-19: Secondary | ICD-10-CM | POA: Diagnosis present

## 2019-06-03 DIAGNOSIS — D689 Coagulation defect, unspecified: Secondary | ICD-10-CM | POA: Diagnosis present

## 2019-06-03 DIAGNOSIS — D649 Anemia, unspecified: Secondary | ICD-10-CM | POA: Diagnosis present

## 2019-06-03 DIAGNOSIS — I10 Essential (primary) hypertension: Secondary | ICD-10-CM | POA: Diagnosis present

## 2019-06-03 DIAGNOSIS — Z91018 Allergy to other foods: Secondary | ICD-10-CM

## 2019-06-03 DIAGNOSIS — K3189 Other diseases of stomach and duodenum: Secondary | ICD-10-CM | POA: Diagnosis present

## 2019-06-03 DIAGNOSIS — Z9119 Patient's noncompliance with other medical treatment and regimen: Secondary | ICD-10-CM

## 2019-06-03 DIAGNOSIS — F1020 Alcohol dependence, uncomplicated: Secondary | ICD-10-CM | POA: Diagnosis present

## 2019-06-03 LAB — BASIC METABOLIC PANEL
Anion gap: 11 (ref 5–15)
Anion gap: 9 (ref 5–15)
BUN: 13 mg/dL (ref 6–20)
BUN: 14 mg/dL (ref 6–20)
CO2: 27 mmol/L (ref 22–32)
CO2: 26 mmol/L (ref 22–32)
Calcium: 7.9 mg/dL — ABNORMAL LOW (ref 8.9–10.3)
Calcium: 7.9 mg/dL — ABNORMAL LOW (ref 8.9–10.3)
Chloride: 81 mmol/L — ABNORMAL LOW (ref 98–111)
Chloride: 81 mmol/L — ABNORMAL LOW (ref 98–111)
Creatinine, Ser: 1.01 mg/dL (ref 0.61–1.24)
Creatinine, Ser: 0.94 mg/dL (ref 0.61–1.24)
GFR calc Af Amer: >60 mL/min (ref >60)
GFR calc Af Amer: >60 mL/min (ref >60)
GFR calc non Af Amer: >60 mL/min (ref >60)
GFR calc non Af Amer: >60 mL/min (ref >60)
Glucose, Bld: 112 mg/dL — ABNORMAL HIGH (ref 70–99)
Glucose, Bld: 145 mg/dL — ABNORMAL HIGH (ref 70–99)
Potassium: 3.7 mmol/L (ref 3.5–5.1)
Potassium: 3.7 mmol/L (ref 3.5–5.1)
Sodium: 119 mmol/L — CL (ref 135–145)
Sodium: 116 mmol/L — CL (ref 135–145)

## 2019-06-03 LAB — URINALYSIS, COMPLETE (UACMP) WITH MICROSCOPIC
Bilirubin Urine: NEGATIVE
Glucose, UA: NEGATIVE mg/dL
Hgb urine dipstick: NEGATIVE
Ketones, ur: 5 mg/dL — AB
Leukocytes,Ua: NEGATIVE
Nitrite: NEGATIVE
Protein, ur: NEGATIVE mg/dL
Specific Gravity, Urine: 1.013 (ref 1.005–1.030)
pH: 5 (ref 5.0–8.0)

## 2019-06-03 LAB — CBC
HCT: 30.7 % — ABNORMAL LOW (ref 39.0–52.0)
HCT: 29 % — ABNORMAL LOW (ref 39.0–52.0)
Hemoglobin: 10.6 g/dL — ABNORMAL LOW (ref 13.0–17.0)
Hemoglobin: 10.2 g/dL — ABNORMAL LOW (ref 13.0–17.0)
MCH: 33.4 pg (ref 26.0–34.0)
MCH: 34.1 pg — ABNORMAL HIGH (ref 26.0–34.0)
MCHC: 34.5 g/dL (ref 30.0–36.0)
MCHC: 35.2 g/dL (ref 30.0–36.0)
MCV: 96.8 fL (ref 80.0–100.0)
MCV: 97 fL (ref 80.0–100.0)
Platelets: 230 10*3/uL (ref 150–400)
Platelets: 218 10*3/uL (ref 150–400)
RBC: 3.17 MIL/uL — ABNORMAL LOW (ref 4.22–5.81)
RBC: 2.99 MIL/uL — ABNORMAL LOW (ref 4.22–5.81)
RDW: 14.6 % (ref 11.5–15.5)
RDW: 14.7 % (ref 11.5–15.5)
WBC: 5.6 10*3/uL (ref 4.0–10.5)
WBC: 5.8 10*3/uL (ref 4.0–10.5)
nRBC: 0 % (ref 0.0–0.2)
nRBC: 0 % (ref 0.0–0.2)

## 2019-06-03 LAB — COMPREHENSIVE METABOLIC PANEL
ALT: 64 U/L — ABNORMAL HIGH (ref 0–44)
AST: 142 U/L — ABNORMAL HIGH (ref 15–41)
Albumin: 2.3 g/dL — ABNORMAL LOW (ref 3.5–5.0)
Alkaline Phosphatase: 119 U/L (ref 38–126)
Anion gap: 12 (ref 5–15)
BUN: 12 mg/dL (ref 6–20)
CO2: 26 mmol/L (ref 22–32)
Calcium: 8.1 mg/dL — ABNORMAL LOW (ref 8.9–10.3)
Chloride: 79 mmol/L — ABNORMAL LOW (ref 98–111)
Creatinine, Ser: 1.01 mg/dL (ref 0.61–1.24)
GFR calc Af Amer: >60 mL/min (ref >60)
GFR calc non Af Amer: >60 mL/min (ref >60)
Glucose, Bld: 118 mg/dL — ABNORMAL HIGH (ref 70–99)
Potassium: 3.6 mmol/L (ref 3.5–5.1)
Sodium: 117 mmol/L — CL (ref 135–145)
Total Bilirubin: 4 mg/dL — ABNORMAL HIGH (ref 0.3–1.2)
Total Protein: 6.8 g/dL (ref 6.5–8.1)

## 2019-06-03 LAB — HIV ANTIBODY (ROUTINE TESTING W REFLEX): HIV Screen 4th Generation wRfx: NONREACTIVE

## 2019-06-03 LAB — ETHANOL: Alcohol, Ethyl (B): <10 mg/dL (ref <10)

## 2019-06-03 LAB — URINALYSIS, ROUTINE W REFLEX MICROSCOPIC
Bilirubin Urine: NEGATIVE
Glucose, UA: NEGATIVE mg/dL
Hgb urine dipstick: NEGATIVE
Ketones, ur: NEGATIVE mg/dL
Leukocytes,Ua: NEGATIVE
Nitrite: NEGATIVE
Protein, ur: NEGATIVE mg/dL
Specific Gravity, Urine: 1.011 (ref 1.005–1.030)
pH: 6 (ref 5.0–8.0)

## 2019-06-03 LAB — RAPID URINE DRUG SCREEN, HOSP PERFORMED
Amphetamines: NOT DETECTED
Barbiturates: NOT DETECTED
Benzodiazepines: NOT DETECTED
Cocaine: NOT DETECTED
Opiates: NOT DETECTED
Tetrahydrocannabinol: NOT DETECTED

## 2019-06-03 LAB — OSMOLALITY, URINE: Osmolality, Ur: 411 mOsm/kg (ref 300–900)

## 2019-06-03 LAB — PHOSPHORUS: Phosphorus: 4 mg/dL (ref 2.5–4.6)

## 2019-06-03 LAB — OSMOLALITY: Osmolality: 257 mOsm/kg — ABNORMAL LOW (ref 275–295)

## 2019-06-03 LAB — PROTIME-INR
INR: 1.3 — ABNORMAL HIGH (ref 0.8–1.2)
Prothrombin Time: 15.8 seconds — ABNORMAL HIGH (ref 11.4–15.2)

## 2019-06-03 LAB — TSH: TSH: 1.475 u[IU]/mL (ref 0.350–4.500)

## 2019-06-03 LAB — LIPID PANEL
Cholesterol: 166 mg/dL (ref 0–200)
HDL: 27 mg/dL — ABNORMAL LOW (ref >40)
LDL Cholesterol: 125 mg/dL — ABNORMAL HIGH (ref 0–99)
Total CHOL/HDL Ratio: 6.1 RATIO
Triglycerides: 69 mg/dL (ref <150)
VLDL: 14 mg/dL (ref 0–40)

## 2019-06-03 LAB — SODIUM
Sodium: 122 mmol/L — ABNORMAL LOW (ref 135–145)
Sodium: 118 mmol/L — CL (ref 135–145)

## 2019-06-03 LAB — LIPASE, BLOOD: Lipase: 52 U/L — ABNORMAL HIGH (ref 11–51)

## 2019-06-03 LAB — MAGNESIUM: Magnesium: 2 mg/dL (ref 1.7–2.4)

## 2019-06-03 LAB — POC SARS CORONAVIRUS 2 AG -  ED: SARS Coronavirus 2 Ag: NEGATIVE

## 2019-06-03 LAB — MRSA PCR SCREENING: MRSA by PCR: NEGATIVE

## 2019-06-03 LAB — CREATININE, URINE, RANDOM: Creatinine, Urine: 91.09 mg/dL

## 2019-06-03 LAB — BILIRUBIN, DIRECT: Bilirubin, Direct: 1.4 mg/dL — ABNORMAL HIGH (ref 0.0–0.2)

## 2019-06-03 LAB — SODIUM, URINE, RANDOM: Sodium, Ur: 18 mmol/L

## 2019-06-03 LAB — T4, FREE: Free T4: 1.57 ng/dL — ABNORMAL HIGH (ref 0.61–1.12)

## 2019-06-03 MED ORDER — SPIRONOLACTONE 100 MG PO TABS
100.0000 mg | ORAL_TABLET | Freq: Every day | ORAL | Status: DC
  Administered 2019-06-04 – 2019-06-06 (×3): 100 mg via ORAL
  Filled 2019-06-03 (×3): qty 1

## 2019-06-03 MED ORDER — ACETAMINOPHEN 650 MG RE SUPP: 650.0000 mg | Freq: Four times a day (QID) | RECTAL | Status: DC | PRN

## 2019-06-03 MED ORDER — LORAZEPAM 1 MG PO TABS: 1.0000 mg | ORAL_TABLET | ORAL | Status: DC | PRN

## 2019-06-03 MED ORDER — ACETAMINOPHEN 325 MG PO TABS: 650.0000 mg | ORAL_TABLET | Freq: Four times a day (QID) | ORAL | Status: DC | PRN

## 2019-06-03 MED ORDER — ALBUMIN HUMAN 5 % IV SOLN
25.0000 g | Freq: Once | INTRAVENOUS | Status: AC
  Administered 2019-06-03: 25 g via INTRAVENOUS
  Filled 2019-06-03: qty 500

## 2019-06-03 MED ORDER — FOLIC ACID 1 MG PO TABS: 1.0000 mg | ORAL_TABLET | Freq: Every day | ORAL | Status: DC

## 2019-06-03 MED ORDER — PROSIGHT PO TABS
1.0000 | ORAL_TABLET | Freq: Every day | ORAL | Status: DC
  Administered 2019-06-03 – 2019-06-06 (×4): 1 via ORAL
  Filled 2019-06-03 (×4): qty 1

## 2019-06-03 MED ORDER — THIAMINE HCL 100 MG PO TABS
100.0000 mg | ORAL_TABLET | Freq: Every day | ORAL | Status: DC
  Administered 2019-06-03 – 2019-06-06 (×4): 100 mg via ORAL
  Filled 2019-06-03 (×4): qty 1

## 2019-06-03 MED ORDER — SODIUM CHLORIDE 3 % IV SOLN
INTRAVENOUS | Status: DC
  Administered 2019-06-03: 20 mL/h via INTRAVENOUS
  Filled 2019-06-03: qty 500

## 2019-06-03 MED ORDER — SODIUM CHLORIDE 3 % IV SOLN
INTRAVENOUS | Status: DC
  Filled 2019-06-03: qty 500

## 2019-06-03 MED ORDER — ONDANSETRON HCL 4 MG/2ML IJ SOLN: 4.0000 mg | Freq: Four times a day (QID) | INTRAMUSCULAR | Status: DC | PRN

## 2019-06-03 MED ORDER — ADULT MULTIVITAMIN W/MINERALS CH
1.0000 | ORAL_TABLET | Freq: Every day | ORAL | Status: DC
  Administered 2019-06-03 – 2019-06-06 (×4): 1 via ORAL
  Filled 2019-06-03 (×4): qty 1

## 2019-06-03 MED ORDER — ADULT MULTIVITAMIN W/MINERALS CH: 1.0000 | ORAL_TABLET | Freq: Every day | ORAL | Status: DC

## 2019-06-03 MED ORDER — ALBUMIN HUMAN 5 % IV SOLN
12.5000 g | Freq: Once | INTRAVENOUS | Status: DC
  Filled 2019-06-03: qty 250

## 2019-06-03 MED ORDER — FOLIC ACID 1 MG PO TABS
1.0000 mg | ORAL_TABLET | Freq: Every day | ORAL | Status: DC
  Administered 2019-06-03 – 2019-06-06 (×4): 1 mg via ORAL
  Filled 2019-06-03 (×4): qty 1

## 2019-06-03 MED ORDER — SODIUM CHLORIDE 0.9% FLUSH: 3.0000 mL | Freq: Once | INTRAVENOUS | Status: DC

## 2019-06-03 MED ORDER — FUROSEMIDE 40 MG PO TABS: 40.0000 mg | ORAL_TABLET | Freq: Every day | ORAL | Status: DC

## 2019-06-03 MED ORDER — CHLORHEXIDINE GLUCONATE CLOTH 2 % EX PADS
6.0000 | MEDICATED_PAD | Freq: Every day | CUTANEOUS | Status: DC
  Administered 2019-06-04 – 2019-06-06 (×3): 6 via TOPICAL

## 2019-06-03 MED ORDER — ENOXAPARIN SODIUM 40 MG/0.4ML ~~LOC~~ SOLN
40.0000 mg | SUBCUTANEOUS | Status: DC
  Administered 2019-06-03 – 2019-06-05 (×3): 40 mg via SUBCUTANEOUS
  Filled 2019-06-03 (×3): qty 0.4

## 2019-06-03 MED ORDER — PROPRANOLOL HCL 10 MG PO TABS: 10.0000 mg | ORAL_TABLET | Freq: Two times a day (BID) | ORAL | Status: DC

## 2019-06-03 MED ORDER — ONDANSETRON HCL 4 MG PO TABS: 4.0000 mg | ORAL_TABLET | Freq: Four times a day (QID) | ORAL | Status: DC | PRN

## 2019-06-03 MED ORDER — FUROSEMIDE 10 MG/ML IJ SOLN
80.0000 mg | Freq: Once | INTRAMUSCULAR | Status: AC
  Administered 2019-06-03: 80 mg via INTRAVENOUS
  Filled 2019-06-03: qty 8

## 2019-06-03 MED ORDER — PANTOPRAZOLE SODIUM 40 MG IV SOLR
40.0000 mg | Freq: Two times a day (BID) | INTRAVENOUS | Status: DC
  Administered 2019-06-03 – 2019-06-04 (×2): 40 mg via INTRAVENOUS
  Filled 2019-06-03 (×2): qty 40

## 2019-06-03 MED ORDER — THIAMINE HCL 100 MG PO TABS: 100.0000 mg | ORAL_TABLET | Freq: Every day | ORAL | Status: DC

## 2019-06-03 MED ORDER — THIAMINE HCL 100 MG/ML IJ SOLN
100.0000 mg | Freq: Every day | INTRAMUSCULAR | Status: DC
  Filled 2019-06-03: qty 2

## 2019-06-03 MED ORDER — LORAZEPAM 2 MG/ML IJ SOLN: 1.0000 mg | INTRAMUSCULAR | Status: DC | PRN

## 2019-06-03 NOTE — ED Provider Notes (Signed)
[place] EMERGENCY DEPARTMENT Provider Note   CSN: [id number] Arrival date & time: 06/03/19  0418     History Chief Complaint  Patient presents with  . Abdominal Pain  . Bloated    Shawn Martin is a 44 y.o. male.  HPI   Patient with a history of cirrhosis, celiac disease and ascites presents with abdominal bloating, weakness and crampy. Onset was few days ago, and his most recent paracentesis was 2 weeks ago.  He has had that procedure performed twice. He now presents due to the crampiness, fatigue, and increasing bloating in his abdomen and lower extremities.  He denies substantial pain, acknowledges generalized discomfort.  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  No confusion, disorientation, no chest pain, no other dyspnea. He notes that he has been attempting diet changes to control his electrolytes, including minimal sodium intake.  Past Medical History:  Diagnosis Date  . Celiac disease   . Hepatic cirrhosis ([place])   . Hypertension     Patient Active Problem List   Diagnosis Date Noted  . Other ascites 01/28/2019  . Alcoholic cirrhosis of liver with ascites ([place]) 01/28/2019  . Screening for viral disease 01/28/2019    Past Surgical History:  Procedure Laterality Date  . IR PARACENTESIS  02/07/2019  . IR PARACENTESIS  05/25/2019  . NOSE SURGERY         Family History  Problem Relation Age of Onset  . Healthy Mother   . Hypertension Father   . Leukemia Brother   . Colon cancer Neg Hx   . Stomach cancer Neg Hx   . Pancreatic cancer Neg Hx     Social History   Tobacco Use  . Smoking status: Never Smoker  . Smokeless tobacco: Former Systems developer    Types: Chew  Substance Use Topics  . Alcohol use: Not Currently    Comment: every day drinker, 6 pack per day  . Drug use: Never    Home Medications Prior to Admission medications   Medication Sig Start Date  End Date Taking? Authorizing Provider  furosemide (LASIX) 40 MG tablet Take 40 mg by mouth daily.   Yes [provider]  Multiple Vitamin (MULTIVITAMIN WITH MINERALS) TABS tablet Take 1 tablet by mouth daily.   Yes [provider]  spironolactone (ALDACTONE) 100 MG tablet Take 100 mg by mouth daily.   Yes [provider]  propranolol (INDERAL) 10 MG tablet TAKE 1 TABLET BY MOUTH TWICE DAILY Patient not taking: Reported on 06/03/2019 05/02/19   [name], MD    Allergies    Gluten meal  Review of Systems   Review of Systems  Constitutional:       Per HPI, otherwise negative  HENT:       Per HPI, otherwise negative  Respiratory:       Per HPI, otherwise negative  Cardiovascular:       Per HPI, otherwise negative  Gastrointestinal: Positive for abdominal distention. Negative for vomiting.  Endocrine:       Negative aside from HPI  Genitourinary:       Neg aside from HPI   Musculoskeletal:       Per HPI, otherwise negative  Skin: Negative.   Allergic/Immunologic: Positive for immunocompromised state.  Neurological: Positive for weakness. Negative for syncope.    Physical Exam Updated Vital Signs BP (!) 112/97 (BP Location: Right Arm)   Pulse 90  Temp 98.7 F (37.1 C) (Oral)   Resp 20   Ht 5' 11"  (1.803 m)   Wt 104.3 kg   SpO2 100%   BMI 32.08 kg/m   Physical Exam Vitals and nursing note reviewed.  Constitutional:      General: He is not in acute distress.    Appearance: He is ill-appearing.  HENT:     Head: Normocephalic and atraumatic.  Eyes:     Conjunctiva/sclera: Conjunctivae normal.  Cardiovascular:     Rate and Rhythm: Normal rate and regular rhythm.  Pulmonary:     Effort: Pulmonary effort is normal. No respiratory distress.     Breath sounds: No stridor.  Abdominal:     General: There is distension.     Comments: Protuberant abdomen, without peritoneal findings  Genitourinary:    Testes:        Right: Swelling  present.        Left: Swelling present.  Skin:    General: Skin is warm and dry.  Neurological:     Mental Status: He is alert and oriented to person, place, and time.     ED Results / Procedures / Treatments   Labs (all labs ordered are listed, but only abnormal results are displayed) Labs Reviewed  LIPASE, BLOOD - Abnormal; Notable for the following components:      Result Value   Lipase 52 (*)    All other components within normal limits  COMPREHENSIVE METABOLIC PANEL - Abnormal; Notable for the following components:   Sodium 117 (*)    Chloride 79 (*)    Glucose, Bld 118 (*)    Calcium 8.1 (*)    Albumin 2.3 (*)    AST 142 (*)    ALT 64 (*)    Total Bilirubin 4.0 (*)    All other components within normal limits  CBC - Abnormal; Notable for the following components:   RBC 3.17 (*)    Hemoglobin 10.6 (*)    HCT 30.7 (*)    All other components within normal limits  PROTIME-INR - Abnormal; Notable for the following components:   Prothrombin Time 15.8 (*)    INR 1.3 (*)    All other components within normal limits  BILIRUBIN, DIRECT - Abnormal; Notable for the following components:   Bilirubin, Direct 1.4 (*)    All other components within normal limits  URINALYSIS, ROUTINE W REFLEX MICROSCOPIC  POC SARS CORONAVIRUS 2 AG -  ED    Procedures Procedures (including critical care time)  CRITICAL CARE Performed by: Carmin Muskrat Total critical care time: 35 minutes Critical care time was exclusive of separately billable procedures and treating other patients. Critical care was necessary to treat or prevent imminent or life-threatening deterioration. Critical care was time spent personally by me on the following activities: development of treatment plan with patient and/or surrogate as well as nursing, discussions with consultants, evaluation of patient's response to treatment, examination of patient, obtaining history from patient or surrogate, ordering and performing  treatments and interventions, ordering and review of laboratory studies, ordering and review of radiographic studies, pulse oximetry and re-evaluation of patient's condition.   Medications Ordered in ED Medications  sodium chloride flush (NS) 0.9 % injection 3 mL (has no administration in time range)  sodium chloride (hypertonic) 3 % solution (20 mL/hr Intravenous New Bag/Given 06/03/19 1209)  albumin human 5 % solution 25 g (25 g Intravenous New Bag/Given 06/03/19 1105)  furosemide (LASIX) injection 80 mg (80 mg Intravenous Given  06/03/19 1214)    ED Course  I have reviewed the triage vital signs and the nursing notes.  Pertinent labs & imaging results that were available during my care of the patient were reviewed by me and considered in my medical decision making (see chart for details).   With consideration of SBP versus recurrent ascites versus infection versus electrolyte abnormalities labs initiated.  On initial review is clear the patient has critically ascites, as above 117.  Patient will require admission for this, and given his abdominal discomfort, paracentesis will be performed.  Patient received initial isotonic saline, will require additional resuscitation with appropriate fluids as his electrolytes improved.  With recent paracentesis of greater than 9 L patient is likely to require albumin following today's procedure, and this has been ordered as well.     After initial evaluation I reviewed the patient's case with our GI colleagues, who are fully with the patient.  Given concern for sodium value that is critically low, he will require admission, and as above, paracentesis is pending.  Fluids have been started, albumin ordered, and every been seen and evaluated by GI colleagues, Lasix as well.  This adult male with history of call abuse presents with recurrent ascites, discomfort, and is found with a equally concerning abnormality of his electrolytes with critically abnormal sodium  value.  Patient required discussion with our GI colleagues, who in turn discussed his case with critical care as the patient required hypertonic saline.  Patient requires admission for further monitoring, management.   Final Clinical Impression(s) / ED Diagnoses Final diagnoses:  Ascites  Hyponatremia  Ascites due to alcoholic cirrhosis (HCC)     Carmin Muskrat, MD 06/03/19 1351

## 2019-06-03 NOTE — ED Triage Notes (Signed)
Pt in w/abdominal bloating and discomfort. Hx of liver cirrhosis, had paracentesis 2 wks ago. Is sob w/exertion and feels he needs another tap soon. Denies any n/v or constipation

## 2019-06-03 NOTE — Progress Notes (Signed)
Spoke with Elizebeth Brooking, RN at [facility] and made her aware of serum Na of 118 critical. RN to relay result to [facility] MD.

## 2019-06-03 NOTE — Progress Notes (Signed)
Pt admitted to [facility] Medical ICU. Paged CCM [phone] spoke with NP WMerlene Laughter Received orders to stop the 3% saline due to last Sodium level of 122, which is up by 5 since this mornings Sodium of 117. Also made NP aware of paracentesis scheduled for tomorrow instead of today. Received order for Diet.   CDewaine Oats RN

## 2019-06-03 NOTE — Consult Note (Addendum)
[facility] Consult: 9:37 AM 06/03/2019  LOS: 0 days    Referring Provider: Dr Vanita Panda Primary Care Physician:  Patient, No Pcp Per Primary Gastroenterologist:  Dr Havery Moros, established care in 12/2018.    Reason for Consultation:  hyponatremia   HPI: Aiyden Martinique is a 44 y.o. male.  PMH Depression.  Eczema?.   Diagnosed with cirrhosis, ascites based on ultrasound for elevated LFTs in 07/2018.  History heavy EtOH. Cirrhosis likely due to ETOH but autoimmune markers elevated so ? AIH, plans for transjugular liver biopsy but no date set for this. Celiac disease diagnosed at EGD 01/2019. Thrombocytopenia.  Platelets 141 on 04/24/2019.  02/07/19 2 liter paracentesis.  05/25/19 9.6 liter paracentesis, Albumin given post tap.  No SBP on either occasion, SAAG > 1.1.. 02/08/2019 EGD: Grade 2 mid/distal esophageal varices.  Portal hypertensive gastropathy, biopsies obtained.  Diffuse mucosal flattening, scalloping and D2, biopsied Pathology showed increased intraepithelial lymphocytes and villous blunting in the duodenum.  Gastric biopsy showed mild reactive gastropathy, no H. pylori, metaplasia, dysplasia, malignancy. IgG, IgA, TTG all elevated consistent with celiac disease. 02/07/2019 RUQ ultrasound: Cirrhosis, patent main PV with normal hepatopetal flow.  Ascites.  Partially visualized right pleural effusion.  No gallstones.  Pt has not been abstinent of alcohol.  Seen in the ED on 04/24/2019 with acute intoxication, ETOH level 528.  Family noted altered mentation and an empty bottle of bourbon.  When he drinks he will drink a pint of bourbon daily and he has done so in the past several days. 01/28/2019 started Aldactone 50/Lasix 20 mg a day.  Propanolol 20 mg/bid started 02/08/19.  Office visit with Dr. Havery Moros  2/23, labs w normal bmet.  C/O bloating, increased abdominal girth, a few episodes of minor rectal bleeding.  Diuretics increased to Aldactone 100/Lasix 40 daily.  Underwent the paracentesis the following day. Since paracentesis patient has reaccumulated ascites.  Tells me his lower extremity edema is stable.  Urinating well.  No confusion, imbalance but has been having severe lower extremity cramps for several days. Came to the ED early this morning for evaluation of the cramping, and was also hoping he could have paracentesis since he feels like he needs 1.  Sodium is 117, it was 130 on 2/23, the day prior to large volume paracentesis. Renal function normal. LFTs are elevated.  T bili has gone from 1.6 two weeks ago >> 4 today.  AST/ALT 127/54  >> 142/64.  Alk phos overall improved from 176 six weeks ago >> 124 two  weeks ago.  Social history.  Alcohol consumption as above.  Does not smoke or chew tobacco.  Lives with his wife.  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  He does not drive long distances.  Family history: No issues with liver, intestines, stomach, digestive diseases, alcohol abuse, celiac disease.    Past Medical History:  Diagnosis Date  . Celiac disease   . Hepatic cirrhosis ([place])   . Hypertension     Past Surgical History:  Procedure Laterality Date  .  IR PARACENTESIS  02/07/2019  . IR PARACENTESIS  05/25/2019  . NOSE SURGERY      Prior to Admission medications   Medication Sig Start Date End Date Taking? Authorizing Provider  furosemide (LASIX) 40 MG tablet Take 40 mg by mouth daily.   Yes [provider]  Multiple Vitamin (MULTIVITAMIN WITH MINERALS) TABS tablet Take 1 tablet by mouth daily.   Yes [provider]  spironolactone (ALDACTONE) 100 MG tablet Take 100 mg by mouth daily.   Yes [provider]  propranolol (INDERAL) 10 MG tablet TAKE 1 TABLET BY MOUTH TWICE DAILY Patient  not taking: Reported on 06/03/2019 05/02/19   [name], MD    Scheduled Meds: . sodium chloride flush  3 mL Intravenous Once   Infusions: . albumin human     PRN Meds:    Allergies as of 06/03/2019 - Review Complete 06/03/2019  Allergen Reaction Noted  . Gluten meal  03/21/2019    Family History  Problem Relation Age of Onset  . Healthy Mother   . Hypertension Father   . Leukemia Brother   . Colon cancer Neg Hx   . Stomach cancer Neg Hx   . Pancreatic cancer Neg Hx     Social History   Socioeconomic History  . Marital status: Married    Spouse name: Not on file  . Number of children: Not on file  . Years of education: Not on file  . Highest education level: Not on file  Occupational History  . Not on file  Tobacco Use  . Smoking status: Never Smoker  . Smokeless tobacco: Former Systems developer    Types: Chew  Substance and Sexual Activity  . Alcohol use: Not Currently    Comment: every day drinker, 6 pack per day  . Drug use: Never  . Sexual activity: Yes    Partners: Female  Other Topics Concern  . Not on file  Social History Narrative  . Not on file   Social Determinants of Health   Financial Resource Strain:   . Difficulty of Paying Living Expenses: Not on file  Food Insecurity:   . Worried About Charity fundraiser in the Last Year: Not on file  . Ran Out of Food in the Last Year: Not on file  Transportation Needs:   . Lack of Transportation (Medical): Not on file  . Lack of Transportation (Non-Medical): Not on file  Physical Activity:   . Days of Exercise per Week: Not on file  . Minutes of Exercise per Session: Not on file  Stress:   . Feeling of Stress : Not on file  Social Connections:   . Frequency of Communication with Friends and Family: Not on file  . Frequency of Social Gatherings with Friends and Family: Not on file  . Attends Religious Services: Not on file  . Active Member of Clubs or Organizations: Not on file  . Attends Theatre manager Meetings: Not on file  . Marital Status: Not on file  Intimate Partner Violence:   . Fear of Current or Ex-Partner: Not on file  . Emotionally Abused: Not on file  . Physically Abused: Not on file  . Sexually Abused: Not on file    REVIEW OF SYSTEMS: Constitutional: Feels somewhat tired with the increased weight he is carrying around.  Also has not been sleeping well due to the leg cramps. ENT:  No nose bleeds Pulm: No significant dyspnea.  No cough. CV:  No palpitations,  no chest pressure/pain.  Chronic swelling in his legs up into the thighs. GU:  No hematuria, no frequency GI: See HPI.  No nausea, vomiting, anorexia. Heme: Denies unusual or excessive bleeding or bruising. Transfusions: None. MS: Leg/thigh cramps as above. Neuro:  No headaches, no peripheral tingling or numbness.  Syncope.  No seizures. Derm: Has breakout of a somewhat pruritic rash on his ankles where his sock elastic presses against his legs. Endocrine:  No sweats or chills.  No polyuria or dysuria Immunization: Had initial hep A/hep B vaccination on 2/23, next doses are due 3/23. Travel:  None beyond local counties in last few months.    PHYSICAL EXAM: Vital signs in last 24 hours: Vitals:   06/03/19 0812 06/03/19 0825  BP: (!) 112/97   Pulse: 90   Resp: 20   Temp: 98.7 F (37.1 C)   SpO2: 100% 100%   Wt Readings from Last 3 Encounters:  06/03/19 104.3 kg  05/24/19 105 kg  04/24/19 93.9 kg    General: Patient looks moderately unwell but comfortable, no distress. Head: No facial swelling.  Overall thinning flesh in the face. Eyes: Slight icterus.  No conjunctival pallor.  EOMI. Ears: Not hard of hearing Nose: No congestion or discharge Mouth: Moist, clear, pink oral mucosa.  Tongue midline.  Excellent dentition. Neck: No JVD, no masses, no thyromegaly. Lungs: Clear bilaterally.  No labored breathing, no cough. Heart: RRR.  S1, S2 present.  6-0/4 systolic murmur Abdomen: Protuberant  but soft.  Not tender.  Muffled somewhat hypoactive bowel sounds.  No hernias, bruits.  Do not appreciate splenomegaly..   Rectal: Deferred Musc/Skeltl: No joint redness, swelling, gross deformity Extremities: Anasarca bilateral lower extremities up to the hips. Neurologic: No asterixis or tremor.  Alert.  Oriented x3. Skin: A few telangiectasia on the upper trunk.  Patches of scaly erythema on the left lower leg and a little bit on the right anterior lower leg. Nodes: No cervical adenopathy Psych: Cooperative, laconic.  Affect slightly blunted  Intake/Output from previous day: No intake/output data recorded. Intake/Output this shift: Total I/O In: 20 [P.O.:20] Out: -   LAB RESULTS: Recent Labs    06/03/19 0438  WBC 5.6  HGB 10.6*  HCT 30.7*  PLT 230   BMET Lab Results  Component Value Date   NA 117 (LL) 06/03/2019   NA 130 (L) 05/24/2019   NA 140 04/24/2019   K 3.6 06/03/2019   K 3.8 05/24/2019   K 3.0 (L) 04/24/2019   CL 79 (L) 06/03/2019   CL 93 (L) 05/24/2019   CL 100 04/24/2019   CO2 26 06/03/2019   CO2 28 05/24/2019   CO2 30 04/24/2019   GLUCOSE 118 (H) 06/03/2019   GLUCOSE 115 (H) 05/24/2019   GLUCOSE 122 (H) 04/24/2019   BUN 12 06/03/2019   BUN 11 05/24/2019   BUN 8 04/24/2019   CREATININE 1.01 06/03/2019   CREATININE 0.85 05/24/2019   CREATININE 0.74 04/24/2019   CALCIUM 8.1 (L) 06/03/2019   CALCIUM 8.2 (L) 05/24/2019   CALCIUM 7.7 (L) 04/24/2019   LFT Recent Labs    06/03/19 0438  PROT 6.8  ALBUMIN 2.3*  AST 142*  ALT 64*  ALKPHOS 119  BILITOT 4.0*  BILIDIR 1.4*   PT/INR Lab Results  Component Value Date   INR 1.3 (H) 06/03/2019   INR 1.3 (H) 05/24/2019   INR 1.2 (H) 01/28/2019   Hepatitis Panel No results for input(s): HEPBSAG, HCVAB, HEPAIGM, HEPBIGM in the last 72  hours. C-Diff No components found for: CDIFF Lipase     Component Value Date/Time   LIPASE 52 (H) 06/03/2019 0438    Drugs of Abuse  No results found for:  LABOPIA, COCAINSCRNUR, LABBENZ, AMPHETMU, THCU, LABBARB   RADIOLOGY STUDIES: No results found.    IMPRESSION:   *   Acute hyponatremia.  Renal function normal  *    Ascites, clinically progressive.  On low-dose Lasix/Aldactone. 9.6 L paracentesis 05/25/2019, received IV albumin afterwards.  No SBP then or on previous tap 01/2019.  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  *     Celiac disease.  *   Alcoholism/ETOH abuse, not in remission.  *    Coagulopathy.  INR 1.3, stable compared with 6 weeks ago but up from 1.2 in October 2020.  *    Normocytic anemia.  Hb stable compared with 2 weeks ago but down from 14.2 >> 10.6 over the last 6 weeks. Minor rectal bleeding with stools, no increase in bleeding over the last several weeks.  *    Nonbleeding, grade 2 esophageal varices, portal hypertensive gastropathy.  EGD 09/2018.  On low-dose propanolol.    PLAN:     *   Dr. Carles Collet ordered paracentesis, I added 5 L limit.  Single dose of 25 g IV albumin ordered post paracentesis.  *   Hold Aldactone, Lasix.  Can probably continue propanolol but will discuss with Dr. Havery Moros.  *    Hypertonic saline ordered per hospitalist.  *   2 g sodium diet.  *   At this point I think the patient needs formal alcohol rehab as he has not been able to maintain sobriety on his own.     Azucena Freed  06/03/2019, 9:37 AM Phone [phone]

## 2019-06-03 NOTE — Consult Note (Signed)
NAME:  Shawn Martin, MRN:  [id number], DOB:  03-Jan-1976, LOS: 0 ADMISSION DATE:  06/03/2019, CONSULTATION DATE: 3/5 REFERRING MD:  Tat, CHIEF COMPLAINT:  Symptomatic hyponatremia    Brief History   -year-old male patient with history of alcohol-related cirrhosis, ascites requiring prior paracentesis and ongoing EtOH abuse.  Presents with weakness and lethargy and sodium of 117.  Initiated on hypertonic saline infusion per nephrology, critical care asked to admit as patient required ICU bed.  History of present illness   44 year old male patient with a known history of alcohol abuse presents to the emergency room with chief complaint of worsening weakness, decreased activity tolerance and worsening abdominal bloating as well as lower extremity edema. On presentation he denied chest pain, shortness of breath, fever chills abdominal pain, nausea vomiting diarrhea or melena.  No seizure-like activity.  Of note he recently had a paracentesis on 2/24 at which time 9.6 L of ascitic fluid was removed. He does continue to drink alcohol.  Reports the last time he drank was about 1 week prior to presentation.  However hospital records report he has not been forthcoming in alcohol consumption in the past. He reported he had been taking his furosemide as well as spironolactone, he has been following a low-sodium diet. Diagnostic evaluation in the emergency room: Sodium of 117, creatinine 1.01, AST 142, ALT 64, alk phos 119.  He was initially seen by the internal medicine service, both nephrology and cardiology were consulted.  He was seen by nephrology who felt diagnostic evaluation was consistent with hypervolemic hyponatremia in the setting of his alcohol related cirrhosis and suggested initiating 3% saline infusion as patient was symptomatic because of this the critical care service has been asked to admit while patient is on hypertonic infusion  Past Medical History  ETOH related Cirrhosis, esophageal  varices, portal HTN, anemia, ascites  Significant Hospital Events   3/4 admitted, 3% gtt started.   Consults:  pccm  GI   Procedures:  Paracentesis pending   Significant Diagnostic Tests:  Na 117 (this was 130 10 days prior), Urine spec grav: 1.031; lipase 52   Micro Data:  COVID-19: Negative  Antimicrobials:    Interim history/subjective:  Feels a little better  Objective   Blood pressure 126/78, pulse 94, temperature 98.7 F (37.1 C), temperature source Oral, resp. rate (Abnormal) 21, height 5' 11"  (1.803 m), weight 104.3 kg, SpO2 98 %.        Intake/Output Summary (Last 24 hours) at 06/03/2019 1339 Last data filed at 06/03/2019 0817 Gross per 24 hour  Intake 20 ml  Output no documentation  Net 20 ml   Filed Weights   06/03/19 0432 06/03/19 0812  Weight: 105 kg 104.3 kg    Examination: General: chronically ill appearing 44 year old male resting in bed. No distress HENT: [place] does have temporal wasting MMM sclera a little icteric  Lungs: clear not labored  Cardiovascular: RRR  Abdomen: firm, distended shifting dullness to percussion  Extremities: Pitting edema both LEs Neuro: awake and oriented  GU: cl yellow  Resolved Hospital Problem list     Assessment & Plan:   Symptomatic hyponatremia.  Most likely secondary to alcohol related cirrhosis.  Typically treated with fluid volume restriction for which the patient has not been compliant with. Nephrology Plan Continue 3% saline infusion with serial blood chemistries every 2 hours, once sodium up to 122-124 we will discontinue, he is at very high risk for rapid over correction Has already received Lasix Follow-up  pending urine studies, urine Osmo, and urine sodium sent  Alcohol-related cirrhosis with history of esophageal varices Plan Continue Lasix and spironolactone Fluid restriction Resume Inderal when able  Ascites secondary to above Plan Therapeutic paracentesis ordered  ETOH abuse w/ risk of WD  Plan Agree w/ CIWA protocol   Chronic anemia w/out evidence of bleeding Plan Trend cbc   Best practice:  Diet: NPO Pain/Anxiety/Delirium protocol (if indicated): na VAP protocol (if indicated): na DVT prophylaxis: scd GI prophylaxis: PPI Glucose control NA Mobility: w assist Code Status: full code Family Communication: pending  Disposition: to ICU over night then triad to assume care.   Labs   CBC: Recent Labs  Lab 06/03/19 0438  WBC 5.6  HGB 10.6*  HCT 30.7*  MCV 96.8  PLT 103    Basic Metabolic Panel: Recent Labs  Lab 06/03/19 0438  NA 117*  K 3.6  CL 79*  CO2 26  GLUCOSE 118*  BUN 12  CREATININE 1.01  CALCIUM 8.1*   GFR: Estimated Creatinine Clearance: 113.5 mL/min (by C-G formula based on SCr of 1.01 mg/dL). Recent Labs  Lab 06/03/19 0438  WBC 5.6    Liver Function Tests: Recent Labs  Lab 06/03/19 0438  AST 142*  ALT 64*  ALKPHOS 119  BILITOT 4.0*  PROT 6.8  ALBUMIN 2.3*   Recent Labs  Lab 06/03/19 0438  LIPASE 52*   No results for input(s): AMMONIA in the last 168 hours.  ABG No results found for: PHART, PCO2ART, PO2ART, HCO3, TCO2, ACIDBASEDEF, O2SAT   Coagulation Profile: Recent Labs  Lab 06/03/19 0438  INR 1.3*    Cardiac Enzymes: No results for input(s): CKTOTAL, CKMB, CKMBINDEX, TROPONINI in the last 168 hours.  HbA1C: No results found for: HGBA1C  CBG: No results for input(s): GLUCAP in the last 168 hours.  Review of Systems:   Review of Systems  Constitutional: Positive for malaise/fatigue. Negative for chills and fever.  HENT: Negative.   Eyes: Negative.   Respiratory: Negative.   Cardiovascular: Positive for leg swelling. Negative for chest pain and palpitations.  Gastrointestinal: Positive for abdominal pain and nausea. Negative for blood in stool, constipation, diarrhea and melena.  Genitourinary: Negative.   Musculoskeletal: Negative.   Skin: Negative.   Neurological: Negative.    Endo/Heme/Allergies: Negative.   Psychiatric/Behavioral: Negative.      Past Medical History  He,  has a past medical history of Celiac disease, Hepatic cirrhosis ([place]), and Hypertension.   Surgical History    Past Surgical History:  Procedure Laterality Date  . IR PARACENTESIS  02/07/2019  . IR PARACENTESIS  05/25/2019  . NOSE SURGERY       Social History   reports that he has never smoked. He has quit using smokeless tobacco.  His smokeless tobacco use included chew. He reports previous alcohol use. He reports that he does not use drugs.   Family History   His family history includes Healthy in his mother; Hypertension in his father; Leukemia in his brother. There is no history of Colon cancer, Stomach cancer, or Pancreatic cancer.   Allergies Allergies  Allergen Reactions  . Gluten Meal      Home Medications  Prior to Admission medications   Medication Sig Start Date End Date Taking? Authorizing Provider  furosemide (LASIX) 40 MG tablet Take 40 mg by mouth daily.   Yes [provider]  Multiple Vitamin (MULTIVITAMIN WITH MINERALS) TABS tablet Take 1 tablet by mouth daily.   Yes [provider]  spironolactone (ALDACTONE) 100 MG tablet Take 100 mg by mouth daily.   Yes [provider]  propranolol (INDERAL) 10 MG tablet TAKE 1 TABLET BY MOUTH TWICE DAILY Patient not taking: Reported on 06/03/2019 05/02/19   Armbruster, [name], MD     Critical care time: 31 min     Erick Colace ACNP-BC [place] Pager # [phone] OR # [phone] if no answer

## 2019-06-03 NOTE — H&P (Signed)
History and Physical  Shawn Martin DJM:[id number] DOB: January 11, 1976 DOA: 06/03/2019   PCP: Patient, No Pcp Per   Patient coming from: Home  Chief Complaint: abdominal bloating, generalized weakness  HPI:  Shawn Martin is a 44 y.o. male with medical history of alcoholic cirrhosis, esophageal varices, portal hypertensive gastropathy presenting with 1 to 2-week history of increasing abdominal bloating and generalized weakness.  He is complaining of some crampy generalized abdominal pain.  He denies any fevers, chills, chest pain, shortness of breath, nausea, vomiting, diarrhea, hematochezia, melena.  He denies any seizure-like or tonic-clonic activity.  The patient last had a paracentesis performed on 05/25/2019 when 9.6 L was removed.  He states that since then, he has had increasing abdominal bloating without any frank pain but describes some "cramping" abdominal discomfort.  Unfortunately, the patient continues to drink alcohol.  He drank a half of a pint of whiskey approximately 1 week prior to this admission.  The patient is not very forthcoming when asked how frequently he drinks whiskey.  He denies any other illegal drug use.  He denies any NSAIDs.  He states that he has been following a low-sodium diet.  He endorsed compliance with his furosemide and spironolactone, but he is not taking his Inderal. In the emergency department, the patient was afebrile and hemodynamically stable with oxygen saturation 100% room air.  Sodium was 117 with serum creatinine 1.01.  AST 142, ALT 64, alkaline phosphatase 119, total bilirubin 4.0, lipase 52, albumin 2.3.  WBC 5.6, hemoglobin 10.6, platelets 203,000.  Paracentesis was requested. [facility] and [facility] were consulted.  Assessment/Plan: Hyponatremia -Presented with sodium 117 -Multifactorial including liver cirrhosis in the setting of poor solute intake -Start hypertonic saline -Check sodium every 2 hours x 2, then q 4 hours -Nephrology  consulted--spoke with Dr. Augustin Coupe -Serum osmolarity -Urine osmolarity -Urine sodium -Urine creatinine -TSH -A.m. cortisol -A.m. lipid panel  Decompensated liver cirrhosis with ascites -Paracentesis requested -Albumin to be transfused after paracentesis -Obtain cell count and albumin in the ascites  Alcohol dependence -Alcohol withdrawal protocol -Patient endorses last drink being 1 week prior to admission -UDS  Transaminasemia -likely due to Etoh -hep B surface antigen neg -hep C antibody neg -02/22/19 ANA--1:320 -GI consulted--[name]  Esophageal varices history/Symptomatic Anemia -02/08/2019 EGD--grade 2 esophageal varices, portal hypertensive gastropathy, scalloped appearance of the duodenum -Hgb dropped from 14.2 on 04/24/19 to 10.7 on 05/24/19 -Start PPI bid - GI consulted       Past Medical History:  Diagnosis Date  . Celiac disease   . Hepatic cirrhosis ([place])   . Hypertension    Past Surgical History:  Procedure Laterality Date  . IR PARACENTESIS  02/07/2019  . IR PARACENTESIS  05/25/2019  . NOSE SURGERY     Social History:  reports that he has never smoked. He has quit using smokeless tobacco.  His smokeless tobacco use included chew. He reports previous alcohol use. He reports that he does not use drugs.   Family History  Problem Relation Age of Onset  . Healthy Mother   . Hypertension Father   . Leukemia Brother   . Colon cancer Neg Hx   . Stomach cancer Neg Hx   . Pancreatic cancer Neg Hx      Allergies  Allergen Reactions  . Gluten Meal      Prior to Admission medications   Medication Sig Start Date End Date Taking? Authorizing Provider  furosemide (LASIX) 40 MG tablet Take 40 mg by mouth  daily.   Yes [provider]  Multiple Vitamin (MULTIVITAMIN WITH MINERALS) TABS tablet Take 1 tablet by mouth daily.   Yes [provider]  spironolactone (ALDACTONE) 100 MG tablet Take 100 mg by mouth daily.   Yes [provider]  propranolol (INDERAL) 10 MG tablet TAKE 1 TABLET BY MOUTH TWICE DAILY Patient not taking: Reported on 06/03/2019 05/02/19   [name], MD    Review of Systems:  Constitutional:  No weight loss, night sweats, Fevers, chills Head&Eyes: No headache.  No vision loss.  No eye pain or scotoma ENT:  No Difficulty swallowing,Tooth/dental problems,Sore throat,  No ear ache, post nasal drip,  Cardio-vascular:  No chest pain, Orthopnea, PND, swelling in lower extremities,  dizziness, palpitations  GI:  No  nausea, vomiting, diarrhea, loss of appetite, hematochezia, melena, heartburn, indigestion, Resp:   No cough. No coughing up of blood .No wheezing.No chest wall deformity  Skin:  no rash or lesions.  GU:  no dysuria, change in color of urine, no urgency or frequency. No flank pain.  Musculoskeletal:  No joint pain or swelling. No decreased range of motion. No back pain.  Psych:  No change in mood or affect. No depression or anxiety. Neurologic: No headache, no dysesthesia, no focal weakness, no vision loss. No syncope  Physical Exam: Vitals:   06/03/19 0812 06/03/19 0825 06/03/19 0900 06/03/19 1000  BP: (!) 112/97  123/74 121/77  Pulse: 90  91 95  Resp: 20  19 20   Temp: 98.7 F (37.1 C)   98.7 F (37.1 C)  TempSrc: Oral   Oral  SpO2: 100% 100% 100% 99%  Weight: 104.3 kg     Height: 5' 11"  (1.803 m)      General:  A&O x 3, NAD, nontoxic, pleasant/cooperative Head/Eye: No conjunctival hemorrhage, no icterus, Elaine/AT, No nystagmus ENT:  No icterus,  No thrush, good dentition, no pharyngeal exudate Neck:  No masses, no lymphadenpathy, no bruits CV:  RRR, no rub, no gallop, no S3 Lung:  CTAB, good air movement, no wheeze, no rhonchi Abdomen: soft/NT, +BS, moderate distended, no peritoneal signs;  +ascites Ext: No cyanosis, No rashes, No petechiae, No lymphangitis, 2+LE edema Neuro: CNII-XII intact, strength 4/5 in bilateral upper and lower extremities, no  dysmetria  Labs on Admission:  Basic Metabolic Panel: Recent Labs  Lab 06/03/19 0438  NA 117*  K 3.6  CL 79*  CO2 26  GLUCOSE 118*  BUN 12  CREATININE 1.01  CALCIUM 8.1*   Liver Function Tests: Recent Labs  Lab 06/03/19 0438  AST 142*  ALT 64*  ALKPHOS 119  BILITOT 4.0*  PROT 6.8  ALBUMIN 2.3*   Recent Labs  Lab 06/03/19 0438  LIPASE 52*   No results for input(s): AMMONIA in the last 168 hours. CBC: Recent Labs  Lab 06/03/19 0438  WBC 5.6  HGB 10.6*  HCT 30.7*  MCV 96.8  PLT 230   Coagulation Profile: Recent Labs  Lab 06/03/19 0438  INR 1.3*   Cardiac Enzymes: No results for input(s): CKTOTAL, CKMB, CKMBINDEX, TROPONINI in the last 168 hours. BNP: Invalid input(s): POCBNP CBG: No results for input(s): GLUCAP in the last 168 hours. Urine analysis:    Component Value Date/Time   COLORURINE YELLOW 06/03/2019 [facility] 06/03/2019 0431   LABSPEC 1.011 06/03/2019 0431   PHURINE 6.0 06/03/2019 [facility] 06/03/2019 0431   HGBUR NEGATIVE 06/03/2019 [place] NEGATIVE 06/03/2019 0431  KETONESUR NEGATIVE 06/03/2019 0431   PROTEINUR NEGATIVE 06/03/2019 0431   NITRITE NEGATIVE 06/03/2019 0431   LEUKOCYTESUR NEGATIVE 06/03/2019 0431   Sepsis Labs: @LABRCNTIP (procalcitonin:4,lacticidven:4) )No results found for this or any previous visit (from the past 240 hour(s)).   Radiological Exams on Admission: No results found.     Time spent:60 minutes Code Status:   FULL Family Communication:  No Family at bedside Disposition Plan: expect 2-3 day hospitalization Consults called: [facility],  renal DVT Prophylaxis: [place] Lovenox  Orson Eva, DO  [facility] Pager [phone]  If 7PM-7AM, please contact night-coverage [email or web address] Password [facility] 06/03/2019, 10:38 AM

## 2019-06-03 NOTE — Progress Notes (Signed)
Dr. Augustin Coupe called and discussed patient's sodium dropping to 118.  MD gave order to restart 3 % saline at 20 ml/H and goal is serum Na of 122-123 with bmet q4H.

## 2019-06-03 NOTE — Consult Note (Signed)
Reason for Consult: Hyponatremia Referring Physician:  Dr. Carles Collet  Chief Complaint: abdominal bloating  Assessment/Plan: 1. Hyponatremia - hypervolemic variety in the setting of alcoholic cirrhosis. - Usually treat with fluid restriction (pt has not been doing that) + Lasix + improve nutritional status but certainly understand need for 3%. - I would just use 3% till the Na is in the 122-124 range and  Stop it. - Will give  Lasix 60m IV x1. - Will need to follow the SNa closely as he is certainly at high risk for overcorrection with poor nutritional status.  - In near future may need Tolvaptan if Lasix + fluid restriction fails 2. Liver cirrhosis w/ h/o EV - likely will need LVP again. 3. Alcohol abuse - has relapsed a week ago. 4. Anemia  HPI: Jaxtyn JMartiniqueis an 44y.o. male  Alcoholic cirrhosis, esophageal varices, portal hypertension presenting with increasing abdominal bloating, lower extremity swelling and weakness over the past 1-2 weeks. He denies any f/c/n/v/ dyspnea/ CP/ myalgias. He last had a LVP on 05/25/19 with 9.6L removed. Patient has a history of heavy drinking usually vodka and bourbon, relapsing a week ago. He denies any NSAID use or new medications and has been compliant with his medications but states that he has recently changed jobs. He denies any illicit drug use and states that he is compliant with a low sodium diet. Serum Na was found to be 117 in the ED  ROS Pertinent items are noted in HPI.  Chemistry and CBC: Creatinine, Ser  Date/Time Value Ref Range Status  06/03/2019 04:38 AM 1.01 0.61 - 1.24 mg/dL Final  05/24/2019 05:02 PM 0.85 0.40 - 1.50 mg/dL Final  04/24/2019 11:01 PM 0.74 0.61 - 1.24 mg/dL Final  02/04/2019 09:25 AM 0.68 0.40 - 1.50 mg/dL Final  08/07/2018 11:37 AM 0.67 0.61 - 1.24 mg/dL Final   Recent Labs  Lab 06/03/19 0438  NA 117*  K 3.6  CL 79*  CO2 26  GLUCOSE 118*  BUN 12  CREATININE 1.01  CALCIUM 8.1*   Recent Labs  Lab  06/03/19 0438  WBC 5.6  HGB 10.6*  HCT 30.7*  MCV 96.8  PLT 230   Liver Function Tests: Recent Labs  Lab 06/03/19 0438  AST 142*  ALT 64*  ALKPHOS 119  BILITOT 4.0*  PROT 6.8  ALBUMIN 2.3*   Recent Labs  Lab 06/03/19 0438  LIPASE 52*   No results for input(s): AMMONIA in the last 168 hours. Cardiac Enzymes: No results for input(s): CKTOTAL, CKMB, CKMBINDEX, TROPONINI in the last 168 hours. Iron Studies: No results for input(s): IRON, TIBC, TRANSFERRIN, FERRITIN in the last 72 hours. PT/INR: @LABRCNTIP (inr:5)  Xrays/Other Studies: ) Results for orders placed or performed during the hospital encounter of 06/03/19 (from the past 48 hour(s))  Urinalysis, Routine w reflex microscopic     Status: None   Collection Time: 06/03/19  4:31 AM  Result Value Ref Range   Color, Urine YELLOW YELLOW   APPearance CLEAR CLEAR   Specific Gravity, Urine 1.011 1.005 - 1.030   pH 6.0 5.0 - 8.0   Glucose, UA NEGATIVE NEGATIVE mg/dL   Hgb urine dipstick NEGATIVE NEGATIVE   Bilirubin Urine NEGATIVE NEGATIVE   Ketones, ur NEGATIVE NEGATIVE mg/dL   Protein, ur NEGATIVE NEGATIVE mg/dL   Nitrite NEGATIVE NEGATIVE   Leukocytes,Ua NEGATIVE NEGATIVE    Comment: Performed at [facility] [place] [id number] Lipase, blood     Status:  Abnormal   Collection Time: 06/03/19  4:38 AM  Result Value Ref Range   Lipase 52 (H) 11 - 51 U/L    Comment: Performed at [facility] [address], [place], [place] [address]  Comprehensive metabolic panel     Status: Abnormal   Collection Time: 06/03/19  4:38 AM  Result Value Ref Range   Sodium 117 (LL) 135 - 145 mmol/L    Comment: CRITICAL RESULT CALLED TO, READ BACK BY AND VERIFIED WITH: Barrett Shell RN [id number]    Potassium 3.6 3.5 - 5.1 mmol/L   Chloride 79 (L) 98 - 111 mmol/L   CO2 26 22 - 32 mmol/L   Glucose, Bld 118 (H) 70 - 99 mg/dL    Comment: Glucose reference range applies only to  samples taken after fasting for at least 8 hours.   BUN 12 6 - 20 mg/dL   Creatinine, Ser 1.01 0.61 - 1.24 mg/dL   Calcium 8.1 (L) 8.9 - 10.3 mg/dL   Total Protein 6.8 6.5 - 8.1 g/dL   Albumin 2.3 (L) 3.5 - 5.0 g/dL   AST 142 (H) 15 - 41 U/L   ALT 64 (H) 0 - 44 U/L   Alkaline Phosphatase 119 38 - 126 U/L   Total Bilirubin 4.0 (H) 0.3 - 1.2 mg/dL   GFR calc non Af Amer >60 >60 mL/min   GFR calc Af Amer >60 >60 mL/min   Anion gap 12 5 - 15    Comment: Performed at [facility] [address], [place], [place] [address]  CBC     Status: Abnormal   Collection Time: 06/03/19  4:38 AM  Result Value Ref Range   WBC 5.6 4.0 - 10.5 K/uL   RBC 3.17 (L) 4.22 - 5.81 MIL/uL   Hemoglobin 10.6 (L) 13.0 - 17.0 g/dL   HCT 30.7 (L) 39.0 - 52.0 %   MCV 96.8 80.0 - 100.0 fL   MCH 33.4 26.0 - 34.0 pg   MCHC 34.5 30.0 - 36.0 g/dL   RDW 14.6 11.5 - 15.5 %   Platelets 230 150 - 400 K/uL   nRBC 0.0 0.0 - 0.2 %    Comment: Performed at [facility], [place] [address], [place], [place] [address]  Protime-INR     Status: Abnormal   Collection Time: 06/03/19  4:38 AM  Result Value Ref Range   Prothrombin Time 15.8 (H) 11.4 - 15.2 seconds   INR 1.3 (H) 0.8 - 1.2    Comment: (NOTE) INR goal varies based on device and disease states. Performed at [facility], [place] [address] [place], [place] [address]   Bilirubin, direct     Status: Abnormal   Collection Time: 06/03/19  4:38 AM  Result Value Ref Range   Bilirubin, Direct 1.4 (H) 0.0 - 0.2 mg/dL    Comment: Performed at [facility] [address], [place],  [address]  POC SARS Coronavirus 2 Ag-ED - Nasal Swab (BD Veritor Kit)     Status: None   Collection Time: 06/03/19 10:28 AM  Result Value Ref Range   SARS Coronavirus 2 Ag NEGATIVE NEGATIVE    Comment: (NOTE) SARS-CoV-2 antigen NOT DETECTED.  Negative results are presumptive.  Negative results do not preclude SARS-CoV-2 infection and should not be used as the sole  basis for treatment or other patient management decisions, including infection  control decisions, particularly in the presence of clinical signs  and  symptoms consistent with COVID-19, or in those who have been in contact with the virus.  Negative results must be combined with clinical observations, patient history, and epidemiological information. The expected result is Negative. Fact Sheet for Patients: PodPark.tn Fact Sheet for Healthcare Providers: GiftContent.is This test is not yet approved or cleared by the [place] FDA and  has been authorized for detection and/or diagnosis of SARS-CoV-2 by FDA under an Emergency Use Authorization (EUA).  This EUA will remain in effect (meaning this test can be used) for the duration of  the COVID-19 de claration under Section 564(b)(1) of the Act, 21 U.S.C. section 360bbb-3(b)(1), unless the authorization is terminated or revoked sooner.    No results found.  PMH:   Past Medical History:  Diagnosis Date  . Celiac disease   . Hepatic cirrhosis ([place])   . Hypertension     PSH:   Past Surgical History:  Procedure Laterality Date  . IR PARACENTESIS  02/07/2019  . IR PARACENTESIS  05/25/2019  . NOSE SURGERY      Allergies:  Allergies  Allergen Reactions  . Gluten Meal     Medications:   Prior to Admission medications   Medication Sig Start Date End Date Taking? Authorizing Provider  furosemide (LASIX) 40 MG tablet Take 40 mg by mouth daily.   Yes [provider]  Multiple Vitamin (MULTIVITAMIN WITH MINERALS) TABS tablet Take 1 tablet by mouth daily.   Yes [provider]  spironolactone (ALDACTONE) 100 MG tablet Take 100 mg by mouth daily.   Yes [provider]  propranolol (INDERAL) 10 MG tablet TAKE 1 TABLET BY MOUTH TWICE DAILY Patient not taking: Reported on 06/03/2019 05/02/19   Armbruster, [name], MD    Discontinued Meds:   Medications  Discontinued During This Encounter  Medication Reason  . furosemide (LASIX) 20 MG tablet Duplicate  . spironolactone (ALDACTONE) 50 MG tablet Duplicate  . furosemide (LASIX) 20 MG tablet Dose change  . spironolactone (ALDACTONE) 50 MG tablet Dose change  . albumin human 5 % solution 12.5 g     Social History:  reports that he has never smoked. He has quit using smokeless tobacco.  His smokeless tobacco use included chew. He reports previous alcohol use. He reports that he does not use drugs.  Family History:   Family History  Problem Relation Age of Onset  . Healthy Mother   . Hypertension Father   . Leukemia Brother   . Colon cancer Neg Hx   . Stomach cancer Neg Hx   . Pancreatic cancer Neg Hx     Blood pressure 126/78, pulse 94, temperature 98.7 F (37.1 C), temperature source Oral, resp. rate (!) 21, height 5' 11"  (1.803 m), weight 104.3 kg, SpO2 98 %. General appearance: alert, cooperative and appears stated age Head: Normocephalic, without obvious abnormality, atraumatic Eyes: negative Neck: no adenopathy, no carotid bruit, supple, symmetrical, trachea midline and thyroid not enlarged, symmetric, no tenderness/mass/nodules Back: symmetric, no curvature. ROM normal. No CVA tenderness. Resp: clear to auscultation bilaterally Cardio: S1, S2 normal GI: Acites Extremities: edema 2+ Pulses: 2+ and symmetric Skin: Skin color, texture, turgor normal. No rashes or lesions       Jerin Franzel, Hunt Oris, MD 06/03/2019, 11:20 AM

## 2019-06-03 NOTE — Progress Notes (Signed)
[facility] Progress Note Patient Name: Shawn Martin DOB: September 04, 1975 MRN: [id number]   Date of Service  06/03/2019  HPI/Events of Note  Inquiry why patient made NPO, patient requesting to eat and is seen awake sitting upright.  eICU Interventions  Patient for paracentesis in the morning. Will adjust NPO to start midnight instead and not now.     Intervention Category Intermediate Interventions: Other:  Judd Lien 06/03/2019, 7:29 PM

## 2019-06-03 NOTE — Progress Notes (Signed)
Called [facility] and spoke with Gretchin, RN and made her aware of serum Na of 116. Lab was drawn just after 3% saline was started back.

## 2019-06-04 ENCOUNTER — Inpatient Hospital Stay (HOSPITAL_COMMUNITY): Payer: Self-pay

## 2019-06-04 LAB — ALBUMIN, PLEURAL OR PERITONEAL FLUID: Albumin, Fluid: <1 g/dL

## 2019-06-04 LAB — BASIC METABOLIC PANEL
Anion gap: 10 (ref 5–15)
Anion gap: 8 (ref 5–15)
Anion gap: 10 (ref 5–15)
Anion gap: 6 (ref 5–15)
BUN: 15 mg/dL (ref 6–20)
BUN: 15 mg/dL (ref 6–20)
BUN: 14 mg/dL (ref 6–20)
BUN: 13 mg/dL (ref 6–20)
CO2: 25 mmol/L (ref 22–32)
CO2: 27 mmol/L (ref 22–32)
CO2: 26 mmol/L (ref 22–32)
CO2: 27 mmol/L (ref 22–32)
Calcium: 7.6 mg/dL — ABNORMAL LOW (ref 8.9–10.3)
Calcium: 7.7 mg/dL — ABNORMAL LOW (ref 8.9–10.3)
Calcium: 7.6 mg/dL — ABNORMAL LOW (ref 8.9–10.3)
Calcium: 7.6 mg/dL — ABNORMAL LOW (ref 8.9–10.3)
Chloride: 82 mmol/L — ABNORMAL LOW (ref 98–111)
Chloride: 84 mmol/L — ABNORMAL LOW (ref 98–111)
Chloride: 83 mmol/L — ABNORMAL LOW (ref 98–111)
Chloride: 88 mmol/L — ABNORMAL LOW (ref 98–111)
Creatinine, Ser: 0.97 mg/dL (ref 0.61–1.24)
Creatinine, Ser: 0.96 mg/dL (ref 0.61–1.24)
Creatinine, Ser: 0.98 mg/dL (ref 0.61–1.24)
Creatinine, Ser: 0.73 mg/dL (ref 0.61–1.24)
GFR calc Af Amer: >60 mL/min (ref >60)
GFR calc Af Amer: >60 mL/min (ref >60)
GFR calc Af Amer: >60 mL/min (ref >60)
GFR calc Af Amer: >60 mL/min (ref >60)
GFR calc non Af Amer: >60 mL/min (ref >60)
GFR calc non Af Amer: >60 mL/min (ref >60)
GFR calc non Af Amer: >60 mL/min (ref >60)
GFR calc non Af Amer: >60 mL/min (ref >60)
Glucose, Bld: 125 mg/dL — ABNORMAL HIGH (ref 70–99)
Glucose, Bld: 112 mg/dL — ABNORMAL HIGH (ref 70–99)
Glucose, Bld: 116 mg/dL — ABNORMAL HIGH (ref 70–99)
Glucose, Bld: 118 mg/dL — ABNORMAL HIGH (ref 70–99)
Potassium: 3.8 mmol/L (ref 3.5–5.1)
Potassium: 3.9 mmol/L (ref 3.5–5.1)
Potassium: 3.3 mmol/L — ABNORMAL LOW (ref 3.5–5.1)
Potassium: 3.8 mmol/L (ref 3.5–5.1)
Sodium: 117 mmol/L — CL (ref 135–145)
Sodium: 119 mmol/L — CL (ref 135–145)
Sodium: 119 mmol/L — CL (ref 135–145)
Sodium: 121 mmol/L — ABNORMAL LOW (ref 135–145)

## 2019-06-04 LAB — SODIUM
Sodium: 123 mmol/L — ABNORMAL LOW (ref 135–145)
Sodium: 123 mmol/L — ABNORMAL LOW (ref 135–145)

## 2019-06-04 LAB — BODY FLUID CELL COUNT WITH DIFFERENTIAL
Eos, Fluid: 0 %
Lymphs, Fluid: 65 %
Monocyte-Macrophage-Serous Fluid: 32 % — ABNORMAL LOW (ref 50–90)
Neutrophil Count, Fluid: 3 % (ref 0–25)
Total Nucleated Cell Count, Fluid: 54 cu mm (ref 0–1000)

## 2019-06-04 LAB — GRAM STAIN

## 2019-06-04 LAB — CORTISOL-AM, BLOOD: Cortisol - AM: 11.4 ug/dL (ref 6.7–22.6)

## 2019-06-04 MED ORDER — SODIUM CHLORIDE 3 % IV SOLN
INTRAVENOUS | Status: DC
  Administered 2019-06-04: 30 mL/h via INTRAVENOUS
  Filled 2019-06-04: qty 500

## 2019-06-04 MED ORDER — SODIUM CHLORIDE 3 % IV SOLN
INTRAVENOUS | Status: DC
  Administered 2019-06-05: 40 mL/h via INTRAVENOUS
  Filled 2019-06-04: qty 500

## 2019-06-04 MED ORDER — FUROSEMIDE 10 MG/ML IJ SOLN
40.0000 mg | Freq: Two times a day (BID) | INTRAMUSCULAR | Status: DC
  Administered 2019-06-04 – 2019-06-05 (×3): 40 mg via INTRAVENOUS
  Filled 2019-06-04 (×3): qty 4

## 2019-06-04 MED ORDER — LIDOCAINE HCL (PF) 1 % IJ SOLN
INTRAMUSCULAR | Status: AC
  Filled 2019-06-04: qty 30

## 2019-06-04 NOTE — Progress Notes (Addendum)
NAME:  Shawn Martin, MRN:  [id number], DOB:  [birth year], LOS: 1 ADMISSION DATE:  06/03/2019, CONSULTATION DATE: 3/5 REFERRING MD:  Tat, CHIEF COMPLAINT:  Symptomatic hyponatremia    Brief History   -year-old male patient with history of alcohol-related cirrhosis, ascites requiring prior paracentesis and ongoing EtOH abuse.  Presents with weakness and lethargy and sodium of 117.  Initiated on hypertonic saline infusion per nephrology, critical care asked to admit as patient required ICU bed.  History of present illness   44 year old male patient with a known history of alcohol abuse presents to the emergency room with chief complaint of worsening weakness, decreased activity tolerance and worsening abdominal bloating as well as lower extremity edema. On presentation he denied chest pain, shortness of breath, fever chills abdominal pain, nausea vomiting diarrhea or melena.  No seizure-like activity.  Of note he recently had a paracentesis on 2/24 at which time 9.6 L of ascitic fluid was removed. He does continue to drink alcohol.  Reports the last time he drank was about 1 week prior to presentation.  However hospital records report he has not been forthcoming in alcohol consumption in the past. He reported he had been taking his furosemide as well as spironolactone, he has been following a low-sodium diet. Diagnostic evaluation in the emergency room: Sodium of 117, creatinine 1.01, AST 142, ALT 64, alk phos 119.  He was initially seen by the internal medicine service, both nephrology and cardiology were consulted.  He was seen by nephrology who felt diagnostic evaluation was consistent with hypervolemic hyponatremia in the setting of his alcohol related cirrhosis and suggested initiating 3% saline infusion as patient was symptomatic because of this the critical care service has been asked to admit while patient is on hypertonic infusion  Past Medical History  ETOH related Cirrhosis, esophageal  varices, portal HTN, anemia, ascites  Significant Hospital Events   3/4 admitted, 3% gtt started.   Consults:  pccm  GI  nephrology  Procedures:  5L Paracentesis 3/5  Significant Diagnostic Tests:    Micro Data:  COVID-19: Negative  Antimicrobials:    Interim history/subjective:  3/6: 5L removed on para this am. Remains on 3% with increased rate. Can start diet. Pt is alert and oriented. Without complaints with exception of hunger  Objective   Blood pressure 132/86, pulse 95, temperature 98.1 F (36.7 C), temperature source Oral, resp. rate (!) 26, height 5' 11"  (1.803 m), weight 104.3 kg, SpO2 97 %.        Intake/Output Summary (Last 24 hours) at 06/04/2019 1355 Last data filed at 06/04/2019 1200 Gross per 24 hour  Intake 1835.47 ml  Output 6475 ml  Net -4639.53 ml   Filed Weights   06/03/19 0432 06/03/19 0812  Weight: 105 kg 104.3 kg    Examination: General: chronically ill appearing 44 year old male resting in bed. No distress HENT: [place] does have temporal wasting MMM sclera a little icteric  Lungs: clear not labored  Cardiovascular: RRR  Abdomen: firm, distended shifting dullness to percussion  Extremities: Pitting edema both LEs Neuro: awake and oriented  GU: cl yellow  Resolved Hospital Problem list     Assessment & Plan:   Symptomatic hypervolemic hyponatremia.  Most likely secondary to alcohol related cirrhosis.  Typically treated with fluid volume restriction for which the patient has not been compliant with. Plan -appreciate nephrology -Continue 3% saline infusion with serial blood chemistries every 4 hours, once sodium up to 122-124 we will discontinue, he is at  very high risk for rapid over correction -lasix ongoing per nephro -good mentation  Alcohol-related cirrhosis with history of esophageal varices Plan Continue Lasix and spironolactone Fluid restriction Resume Inderal when able  Ascites secondary to above Plan Therapeutic  paracentesis completed -culture ordered  ETOH abuse w/ risk of WD Plan Agree w/ CIWA protocol   Chronic normocytic anemia w/out evidence of bleeding Plan Trend cbc -stable without acute indication for transfusion   Best practice:  Diet: reg with fluid restriction diet Pain/Anxiety/Delirium protocol (if indicated): na VAP protocol (if indicated): na DVT prophylaxis: lovenox GI prophylaxis: PPI Glucose control NA Mobility: w assist Code Status: full code Family Communication: with pt Disposition: to ICU while on 3%  Labs   CBC: Recent Labs  Lab 06/03/19 0438 06/03/19 1632  WBC 5.6 5.8  HGB 10.6* 10.2*  HCT 30.7* 29.0*  MCV 96.8 97.0  PLT 230 378    Basic Metabolic Panel: Recent Labs  Lab 06/03/19 1537 06/03/19 1537 06/03/19 1632 06/03/19 1632 06/03/19 1835 06/03/19 2032 06/04/19 0009 06/04/19 0357 06/04/19 0827  NA 119*   < > 122*   < > 118* 116* 117* 119* 119*  K 3.7  --   --   --   --  3.7 3.8 3.9 3.3*  CL 81*  --   --   --   --  81* 82* 84* 83*  CO2 27  --   --   --   --  26 25 27 26   GLUCOSE 112*  --   --   --   --  145* 125* 112* 116*  BUN 13  --   --   --   --  14 15 15 14   CREATININE 1.01  --   --   --   --  0.94 0.97 0.96 0.98  CALCIUM 7.9*  --   --   --   --  7.9* 7.6* 7.7* 7.6*  MG  --   --  2.0  --   --   --   --   --   --   PHOS  --   --  4.0  --   --   --   --   --   --    < > = values in this interval not displayed.   GFR: Estimated Creatinine Clearance: 117 mL/min (by C-G formula based on SCr of 0.98 mg/dL). Recent Labs  Lab 06/03/19 0438 06/03/19 1632  WBC 5.6 5.8    Liver Function Tests: Recent Labs  Lab 06/03/19 0438  AST 142*  ALT 64*  ALKPHOS 119  BILITOT 4.0*  PROT 6.8  ALBUMIN 2.3*   Recent Labs  Lab 06/03/19 0438  LIPASE 52*   No results for input(s): AMMONIA in the last 168 hours.  ABG No results found for: PHART, PCO2ART, PO2ART, HCO3, TCO2, ACIDBASEDEF, O2SAT   Coagulation Profile: Recent Labs  Lab  06/03/19 0438  INR 1.3*    Cardiac Enzymes: No results for input(s): CKTOTAL, CKMB, CKMBINDEX, TROPONINI in the last 168 hours.  HbA1C: No results found for: HGBA1C  CBG: No results for input(s): GLUCAP in the last 168 hours. Critical care time: The patient is critically ill with multiple organ systems failure and requires high complexity decision making for assessment and support, frequent evaluation and titration of therapies, application of advanced monitoring technologies and extensive interpretation of multiple databases.  Critical care time 32 mins. This represents my time independent of the NPs time taking care  of the pt. This is excluding procedures.    [facility] 06/04/2019, 1:55 PM

## 2019-06-04 NOTE — Progress Notes (Addendum)
[facility] Progress Note   44 y.o. male  Alcoholic cirrhosis, esophageal varices, portal hypertension presenting with increasing abdominal bloating, lower extremity swelling and weakness over the past 1-2 weeks. He denies any f/c/n/v/ dyspnea/ CP/ myalgias. He last had a LVP on 05/25/19 with 9.6L removed. Heavy drinking w/ relapse.  He denies any NSAID use or new medications and has been compliant with his medications but states that he has recently changed jobs. He denies any illicit drug use and states that he is compliant with a low sodium diet. Serum Na was found to be 117 in the ED  Assessment/ Plan:   1. Hyponatremia - hypervolemic variety in the setting of alcoholic cirrhosis. - Increase  3% to 8m/hr and will f/u on BMET. - Stop when Na is in the 124-126 range. - Will give  Lasix 40 mg IV q12hr while on the 3% to decrease risk of  Overload -> restart Lasix PO tomorrow.  - In near future may need Tolvaptan if Lasix + fluid restriction fails when off 3%  2. Liver cirrhosis w/ h/o EV - going for  LVP today. 3. Alcohol abuse - has relapsed a week ago. 4. Anemia  Subjective:   Denies dyspnea/ n/v. Asking to eat. No events overnight. 3% stopped and then restarted when it dropped again.   Objective:   BP 135/84 (BP Location: Right Arm)   Pulse 95   Temp 98 F (36.7 C) (Oral)   Resp (!) 22   Ht 5' 11"  (1.803 m)   Wt 104.3 kg   SpO2 93%   BMI 32.08 kg/m   Intake/Output Summary (Last 24 hours) at 06/04/2019 0817 Last data filed at 06/04/2019 0700 Gross per 24 hour  Intake 1716.93 ml  Output 1225 ml  Net 491.93 ml   Weight change: -0.673 kg    Physical Exam: GEN: NAD, A&Ox3, NCAT HEENT:  EOMI NECK: Supple, no thyromegaly LUNGS: CTA B/L no rales, rhonchi or wheezing CV: RRR, No M/R/G ABD: Distended with ascites EXT: 2-3+  lower extremity edema  Imaging: No results found.  Labs: BMET Recent Labs  Lab 06/03/19 0438 06/03/19 1537 06/03/19 1632  06/03/19 1835 06/03/19 2032 06/04/19 0009 06/04/19 0357  NA 117* 119* 122* 118* 116* 117* 119*  K 3.6 3.7  --   --  3.7 3.8 3.9  CL 79* 81*  --   --  81* 82* 84*  CO2 26 27  --   --  26 25 27   GLUCOSE 118* 112*  --   --  145* 125* 112*  BUN 12 13  --   --  14 15 15   CREATININE 1.01 1.01  --   --  0.94 0.97 0.96  CALCIUM 8.1* 7.9*  --   --  7.9* 7.6* 7.7*  PHOS  --   --  4.0  --   --   --   --    CBC Recent Labs  Lab 06/03/19 0438 06/03/19 1632  WBC 5.6 5.8  HGB 10.6* 10.2*  HCT 30.7* 29.0*  MCV 96.8 97.0  PLT 230 218    Medications:    . Chlorhexidine Gluconate Cloth  6 each Topical Daily  . enoxaparin (LOVENOX) injection  40 mg Subcutaneous Q24H  . folic acid  1 mg Oral Daily  . furosemide  40 mg Oral Daily  . multivitamin  1 tablet Oral Daily  . multivitamin with minerals  1 tablet Oral Daily  . pantoprazole (PROTONIX) IV  40 mg  Intravenous Q12H  . sodium chloride flush  3 mL Intravenous Once  . spironolactone  100 mg Oral Daily  . thiamine  100 mg Oral Daily   Or  . thiamine  100 mg Intravenous Daily      Otelia Santee, MD 06/04/2019, 8:17 AM

## 2019-06-04 NOTE — Procedures (Signed)
PROCEDURE SUMMARY:  Successful US guided paracentesis from left lateral abdomen.  Yielded 5.0 liters of yellow, clear fluid.  No immediate complications.  Pt tolerated well.   Specimen was sent for labs.  EBL < 75m  KDocia BarrierPA-C 06/04/2019 12:06 PM

## 2019-06-04 NOTE — Progress Notes (Signed)
Progress Note   Subjective  Feels better following paracentesis    Objective  Vital signs in last 24 hours: Temp:  [97.7 F (36.5 C)-98.2 F (36.8 C)] 98.2 F (36.8 C) (03/06 1200) Pulse Rate:  [88-109] 100 (03/06 1530) Resp:  [15-27] 19 (03/06 1530) BP: (116-140)/(61-86) 126/80 (03/06 1530) SpO2:  [92 %-100 %] 95 % (03/06 1530) Last BM Date: 06/04/19  General: Alert, well-developed, in NAD Heart:  Regular rate and rhythm; no murmurs Chest: Clear to ascultation bilaterally Abdomen:  Soft, non tender, large and distended. Normal bowel sounds, without guarding, and without rebound.   Extremities:  Without edema. Neurologic:  Alert and  oriented x4; grossly normal neurologically. Psych:  Alert and cooperative. Normal mood and affect.  Intake/Output from previous day: 03/05 0701 - 03/06 0700 In: 1736.9 [P.O.:1040; I.V.:196.9; IV Piggyback:500] Out: 1225 [Urine:1225] Intake/Output this shift: Total I/O In: 440.3 [P.O.:240; I.V.:200.3] Out: 6250 [Urine:1250; Other:5000]  Lab Results: Recent Labs    06/03/19 0438 06/03/19 1632  WBC 5.6 5.8  HGB 10.6* 10.2*  HCT 30.7* 29.0*  PLT 230 218   BMET Recent Labs    06/04/19 0357 06/04/19 0827 06/04/19 1357  NA 119* 119* 121*  K 3.9 3.3* 3.8  CL 84* 83* 88*  CO2 27 26 27   GLUCOSE 112* 116* 118*  BUN 15 14 13   CREATININE 0.96 0.98 0.73  CALCIUM 7.7* 7.6* 7.6*   LFT Recent Labs    06/03/19 0438  PROT 6.8  ALBUMIN 2.3*  AST 142*  ALT 64*  ALKPHOS 119  BILITOT 4.0*  BILIDIR 1.4*   PT/INR Recent Labs    06/03/19 0438  LABPROT 15.8*  INR 1.3*   Hepatitis Panel No results for input(s): HEPBSAG, HCVAB, HEPAIGM, HEPBIGM in the last 72 hours.  Studies/Results: US Paracentesis  Result Date: 06/04/2019 INDICATION: Patient with history of cirrhosis, recurrent ascites. Request is made for diagnostic and therapeutic paracentesis. EXAM: ULTRASOUND GUIDED DIAGNOSTIC AND THERAPEUTIC PARACENTESIS MEDICATIONS: 10  mL 1% lidocaine COMPLICATIONS: None immediate. PROCEDURE: Informed written consent was obtained from the patient after a discussion of the risks, benefits and alternatives to treatment. A timeout was performed prior to the initiation of the procedure. Initial ultrasound scanning demonstrates a large amount of ascites within the left lateral abdomen. The left lateral abdomen was prepped and draped in the usual sterile fashion. 1% lidocaine was used for local anesthesia. Following this, a 19 gauge, 10-cm, Yueh catheter was introduced. An ultrasound image was saved for documentation purposes. The paracentesis was performed. The catheter was removed and a dressing was applied. The patient tolerated the procedure well without immediate post procedural complication. FINDINGS: A total of approximately 5.0 liters of yellow fluid was removed. Samples were sent to the laboratory as requested by the clinical team. IMPRESSION: Successful ultrasound-guided paracentesis yielding 5.0 liters of peritoneal fluid. Read by: Brynda Greathouse PA-C Electronically Signed   By: Sandi Mariscal M.D.   On: 06/04/2019 15:07      Assessment & Recommendations   1. Decompensated cirrhosis, most likely alcohol related, however ANA, ASMA positive so further outpatient evaluation for AIH with a transjugular liver biopsy is planned by Dr. Havery Moros.  2. Recently diagnosed celiac disease 3. Severe hyponatremia, Na has increased to 121 this afternoon on 3% saline. 4. Active alcohol use  LVP with 5L removed today Gluten free diet Appreciate TRH, CCM and nephrology managing volume status, hyponatremia  Appreciate nephrology guidance on outpatient diuretic regimen GI available, will follow up in a  few days Outpatient GI follow up with Dr. Havery Moros    LOS: 1 day   Pricilla Riffle. Fuller Plan MD  06/04/2019, 4:07 PM

## 2019-06-05 LAB — BASIC METABOLIC PANEL
Anion gap: 6 (ref 5–15)
BUN: 13 mg/dL (ref 6–20)
CO2: 29 mmol/L (ref 22–32)
Calcium: 7.4 mg/dL — ABNORMAL LOW (ref 8.9–10.3)
Chloride: 91 mmol/L — ABNORMAL LOW (ref 98–111)
Creatinine, Ser: 0.9 mg/dL (ref 0.61–1.24)
GFR calc Af Amer: >60 mL/min (ref >60)
GFR calc non Af Amer: >60 mL/min (ref >60)
Glucose, Bld: 117 mg/dL — ABNORMAL HIGH (ref 70–99)
Potassium: 3.5 mmol/L (ref 3.5–5.1)
Sodium: 126 mmol/L — ABNORMAL LOW (ref 135–145)

## 2019-06-05 LAB — SODIUM
Sodium: 127 mmol/L — ABNORMAL LOW (ref 135–145)
Sodium: 127 mmol/L — ABNORMAL LOW (ref 135–145)
Sodium: 128 mmol/L — ABNORMAL LOW (ref 135–145)

## 2019-06-05 MED ORDER — PROPRANOLOL HCL 20 MG PO TABS
10.0000 mg | ORAL_TABLET | Freq: Two times a day (BID) | ORAL | Status: DC
  Administered 2019-06-05 – 2019-06-06 (×3): 10 mg via ORAL
  Filled 2019-06-05 (×4): qty 1

## 2019-06-05 MED ORDER — POTASSIUM CHLORIDE 20 MEQ/15ML (10%) PO SOLN
20.0000 meq | ORAL | Status: DC
  Administered 2019-06-05: 20 meq
  Filled 2019-06-05: qty 15

## 2019-06-05 MED ORDER — FUROSEMIDE 40 MG PO TABS
40.0000 mg | ORAL_TABLET | Freq: Every day | ORAL | Status: DC
  Administered 2019-06-06: 40 mg via ORAL
  Filled 2019-06-05 (×2): qty 1

## 2019-06-05 MED ORDER — PANTOPRAZOLE SODIUM 40 MG PO TBEC
40.0000 mg | DELAYED_RELEASE_TABLET | Freq: Two times a day (BID) | ORAL | Status: DC
  Administered 2019-06-05 – 2019-06-06 (×3): 40 mg via ORAL
  Filled 2019-06-05 (×3): qty 1

## 2019-06-05 NOTE — Progress Notes (Signed)
Tele report. Sinus tach 102 3 beats of NSVT

## 2019-06-05 NOTE — Progress Notes (Signed)
[facility] ADULT ICU REPLACEMENT PROTOCOL FOR AM LAB REPLACEMENT ONLY  The patient does apply for the [facility] Adult ICU Electrolyte Replacment Protocol based on the criteria listed below:   1. Is GFR >/= 40 ml/min? Yes.    Patient's GFR today is >60 2. Is urine output >/= 0.5 ml/kg/hr for the last 6 hours? Yes.   Patient's UOP is 1.2 ml/kg/hr 3. Is BUN < 60 mg/dL? Yes.    Patient's BUN today is 13 4. Abnormal electrolyte(s): k 3.5 5. Ordered repletion with: protocol 6. If a panic level lab has been reported, has the CCM MD in charge been notified? No..   Physician:   Ordered per tube in order to get suspension. Patient concerned about taking large pills on fluid restrictions.  [facility], Sheretha Shadd A 06/05/2019 6:42 AM

## 2019-06-05 NOTE — Progress Notes (Signed)
NAME:  Shawn Martin, MRN:  [id number], DOB:  1975-04-20, LOS: 2 ADMISSION DATE:  06/03/2019, CONSULTATION DATE: 3/5 REFERRING MD:  Tat, CHIEF COMPLAINT:  Symptomatic hyponatremia    Brief History   -year-old male patient with history of alcohol-related cirrhosis, ascites requiring prior paracentesis and ongoing EtOH abuse.  Presents with weakness and lethargy and sodium of 117.  Initiated on hypertonic saline infusion per nephrology, critical care asked to admit as patient required ICU bed.  History of present illness   44 year old male patient with a known history of alcohol abuse presents to the emergency room with chief complaint of worsening weakness, decreased activity tolerance and worsening abdominal bloating as well as lower extremity edema. On presentation he denied chest pain, shortness of breath, fever chills abdominal pain, nausea vomiting diarrhea or melena.  No seizure-like activity.  Of note he recently had a paracentesis on 2/24 at which time 9.6 L of ascitic fluid was removed. He does continue to drink alcohol.  Reports the last time he drank was about 1 week prior to presentation.  However hospital records report he has not been forthcoming in alcohol consumption in the past. He reported he had been taking his furosemide as well as spironolactone, he has been following a low-sodium diet. Diagnostic evaluation in the emergency room: Sodium of 117, creatinine 1.01, AST 142, ALT 64, alk phos 119.  He was initially seen by the internal medicine service, both nephrology and cardiology were consulted.  He was seen by nephrology who felt diagnostic evaluation was consistent with hypervolemic hyponatremia in the setting of his alcohol related cirrhosis and suggested initiating 3% saline infusion as patient was symptomatic because of this the critical care service has been asked to admit while patient is on hypertonic infusion  Past Medical History  ETOH related Cirrhosis, esophageal  varices, portal HTN, anemia, ascites  Significant Hospital Events   3/4 admitted, 3% gtt started.   Consults:  pccm  GI  nephrology  Procedures:  5L Paracentesis 3/5  Significant Diagnostic Tests:    Micro Data:  COVID-19: Negative  Antimicrobials:    Interim history/subjective:   Following paracentesis yesterday.  Poor insight into symptoms that prompted admission.  Able to ambulate to the restroom independently. Denies any alcohol intake recently.  Suspect that hyponatremia was due to excessive free water intake during hot weather.  Objective   Blood pressure 106/71, pulse 82, temperature 98.6 F (37 C), temperature source Oral, resp. rate 15, height 5' 11"  (1.803 m), weight 104.3 kg, SpO2 96 %.        Intake/Output Summary (Last 24 hours) at 06/05/2019 1437 Last data filed at 06/05/2019 1300 Gross per 24 hour  Intake 1918.71 ml  Output 2500 ml  Net -581.29 ml   Filed Weights   06/03/19 0432 06/03/19 0812  Weight: 105 kg 104.3 kg    Examination: General: chronically ill appearing 43 year old male resting in bed. No distress HENT: [place] does have temporal wasting MMM sclera a little icteric  Lungs: clear not labored  Cardiovascular: RRR  Abdomen: firm, distended shifting dullness to percussion  Extremities: Pitting edema both LEs Neuro: awake and oriented  GU: cl yellow  Resolved Hospital Problem list     Assessment & Plan:   Symptomatic hypervolemic hyponatremia.  Most likely secondary to alcohol related cirrhosis.  Typically treated with fluid volume restriction for which the patient has not been compliant with. Plan -3% saline stopped -Home medications resumed. -Sodium is stable. -Advised patient that he  may need to adjust furosemide dosing based on activity level and weather.  Alcohol-related cirrhosis with history of esophageal varices Plan -Resumed home Inderal, furosemide and spironolactone.  Ascites secondary to above Plan Therapeutic  paracentesis completed -culture ordered  ETOH abuse w/ risk of possible withdrawal although patient denies recent alcohol intake Plan -Agree w/ CIWA protocol   Chronic normocytic anemia w/out evidence of bleeding Plan Trend cbc -stable without acute indication for transfusion   Best practice:  Diet: reg with fluid restriction diet Pain/Anxiety/Delirium protocol (if indicated): na VAP protocol (if indicated): na DVT prophylaxis: lovenox GI prophylaxis: PPI Glucose control NA Mobility: w assist Code Status: full code Family Communication: with pt Disposition: Transfer to floor.  Order reconciliation completed.  May be able to be discharged tomorrow.  Labs   CBC: Recent Labs  Lab 06/03/19 0438 06/03/19 1632  WBC 5.6 5.8  HGB 10.6* 10.2*  HCT 30.7* 29.0*  MCV 96.8 97.0  PLT 230 349    Basic Metabolic Panel: Recent Labs  Lab 06/03/19 1632 06/03/19 1835 06/04/19 0009 06/04/19 0009 06/04/19 0357 06/04/19 0357 06/04/19 0827 06/04/19 0827 06/04/19 1357 06/04/19 1357 06/04/19 1822 06/04/19 2156 06/05/19 0207 06/05/19 0648 06/05/19 1027  NA 122*   < > 117*   < > 119*   < > 119*   < > 121*   < > 123* 123* 126* 127* 127*  K  --    < > 3.8  --  3.9  --  3.3*  --  3.8  --   --   --  3.5  --   --   CL  --    < > 82*  --  84*  --  83*  --  88*  --   --   --  91*  --   --   CO2  --    < > 25  --  27  --  26  --  27  --   --   --  29  --   --   GLUCOSE  --    < > 125*  --  112*  --  116*  --  118*  --   --   --  117*  --   --   BUN  --    < > 15  --  15  --  14  --  13  --   --   --  13  --   --   CREATININE  --    < > 0.97  --  0.96  --  0.98  --  0.73  --   --   --  0.90  --   --   CALCIUM  --    < > 7.6*  --  7.7*  --  7.6*  --  7.6*  --   --   --  7.4*  --   --   MG 2.0  --   --   --   --   --   --   --   --   --   --   --   --   --   --   PHOS 4.0  --   --   --   --   --   --   --   --   --   --   --   --   --   --    < > = values  in this interval not displayed.    GFR: Estimated Creatinine Clearance: 127.4 mL/min (by C-G formula based on SCr of 0.9 mg/dL). Recent Labs  Lab 06/03/19 0438 06/03/19 1632  WBC 5.6 5.8    Liver Function Tests: Recent Labs  Lab 06/03/19 0438  AST 142*  ALT 64*  ALKPHOS 119  BILITOT 4.0*  PROT 6.8  ALBUMIN 2.3*   Recent Labs  Lab 06/03/19 0438  LIPASE 52*   No results for input(s): AMMONIA in the last 168 hours.  ABG No results found for: PHART, PCO2ART, PO2ART, HCO3, TCO2, ACIDBASEDEF, O2SAT   Coagulation Profile: Recent Labs  Lab 06/03/19 0438  INR 1.3*    Cardiac Enzymes: No results for input(s): CKTOTAL, CKMB, CKMBINDEX, TROPONINI in the last 168 hours.  HbA1C: No results found for: HGBA1C  CBG: No results for input(s): GLUCAP in the last 168 hours. Critical care time: The patient is critically ill with multiple organ systems failure and requires high complexity decision making for assessment and support, frequent evaluation and titration of therapies, application of advanced monitoring technologies and extensive interpretation of multiple databases.  Critical care time 32 mins. This represents my time independent of the NPs time taking care of the pt. This is excluding procedures.    [facility] 06/05/2019, 2:37 PM

## 2019-06-05 NOTE — Progress Notes (Signed)
[facility] Progress Note   44 y.o.maleAlcoholic cirrhosis, esophageal varices, portal hypertension presenting with increasing abdominal bloating, lower extremity swelling and weakness over the past 1-2 weeks. He denies any f/c/n/v/ dyspnea/ CP/ myalgias. He last had a LVP on 05/25/19 with 9.6L removed. Heavy drinking w/ relapse.  He denies any NSAID use or new medications and has been compliant with his medications but states that he has recently changed jobs. He denies any illicit drug use and states that he is compliant with a low sodium diet. Serum Na was found to be 117 in the ED  Assessment/ Plan:   1. Hyponatremia - hypervolemic variety in the setting of alcoholic cirrhosis. - Off  3% 46m/hr this AM;  will f/u on BMET. - Continue  Lasix 40 mg PO daily; increase to BID dosing + continued fluid restriction. Hopefully Na remains stable; don't expect his Na to be in normal range.   - Trying to avoid  Tolvaptan as will be more difficult to manage and also will be difficult for pt to obtain. 2. Liver cirrhosis w/ h/o EV -  LVP 3/6 with 5L out. 3. Alcohol abuse - has relapsed a week ago. 4. Anemia  Subjective:   Denies dyspnea/ n/v; good appetite No events overnight. 3% stopped this AM   Objective:   BP 123/76   Pulse 96   Temp 98.3 F (36.8 C) (Oral)   Resp (!) 21   Ht 5' 11"  (1.803 m)   Wt 104.3 kg   SpO2 93%   BMI 32.08 kg/m   Intake/Output Summary (Last 24 hours) at 06/05/2019 0900 Last data filed at 06/05/2019 0600 Gross per 24 hour  Intake 1453 ml  Output 6800 ml  Net -5347 ml   Weight change:   Physical Exam: GEN: NAD, A&Ox3, NCAT HEENT:  EOMI NECK: Supple, no thyromegaly LUNGS: CTA B/L no rales, rhonchi or wheezing CV: RRR, No M/R/G ABD: Distended with ascites EXT: 2-3+  lower extremity edema  Imaging: UKoreaParacentesis  Result Date: 06/04/2019 INDICATION: Patient with history of cirrhosis, recurrent ascites. Request is made for diagnostic  and therapeutic paracentesis. EXAM: ULTRASOUND GUIDED DIAGNOSTIC AND THERAPEUTIC PARACENTESIS MEDICATIONS: 10 mL 1% lidocaine COMPLICATIONS: None immediate. PROCEDURE: Informed written consent was obtained from the patient after a discussion of the risks, benefits and alternatives to treatment. A timeout was performed prior to the initiation of the procedure. Initial ultrasound scanning demonstrates a large amount of ascites within the left lateral abdomen. The left lateral abdomen was prepped and draped in the usual sterile fashion. 1% lidocaine was used for local anesthesia. Following this, a 19 gauge, 10-cm, Yueh catheter was introduced. An ultrasound image was saved for documentation purposes. The paracentesis was performed. The catheter was removed and a dressing was applied. The patient tolerated the procedure well without immediate post procedural complication. FINDINGS: A total of approximately 5.0 liters of yellow fluid was removed. Samples were sent to the laboratory as requested by the clinical team. IMPRESSION: Successful ultrasound-guided paracentesis yielding 5.0 liters of peritoneal fluid. Read by: KBrynda GreathousePA-C Electronically Signed   By: JSandi MariscalM.D.   On: 06/04/2019 15:07    Labs: [id number] Labs  Lab 06/03/19 1537 06/03/19 1537 06/03/19 1632 06/03/19 1835 06/03/19 2032 06/03/19 2032 06/04/19 0009 06/04/19 0009 06/04/19 0357 06/04/19 0827 06/04/19 1357 06/04/19 1822 06/04/19 2156 06/05/19 0207 06/05/19 0648  NA 119*   < > 122*   < > 116*   < > 117*   < >  119* 119* 121* 123* 123* 126* 127*  K 3.7  --   --   --  3.7  --  3.8  --  3.9 3.3* 3.8  --   --  3.5  --   CL 81*  --   --   --  81*  --  82*  --  84* 83* 88*  --   --  91*  --   CO2 27  --   --   --  26  --  25  --  27 26 27   --   --  29  --   GLUCOSE 112*  --   --   --  145*  --  125*  --  112* 116* 118*  --   --  117*  --   BUN 13  --   --   --  14  --  15  --  15 14 13   --   --  13  --   CREATININE 1.01   --   --   --  0.94  --  0.97  --  0.96 0.98 0.73  --   --  0.90  --   CALCIUM 7.9*  --   --   --  7.9*  --  7.6*  --  7.7* 7.6* 7.6*  --   --  7.4*  --   PHOS  --   --  4.0  --   --   --   --   --   --   --   --   --   --   --   --    < > = values in this interval not displayed.   CBC Recent Labs  Lab 06/03/19 0438 06/03/19 1632  WBC 5.6 5.8  HGB 10.6* 10.2*  HCT 30.7* 29.0*  MCV 96.8 97.0  PLT 230 218    Medications:    . Chlorhexidine Gluconate Cloth  6 each Topical Daily  . enoxaparin (LOVENOX) injection  40 mg Subcutaneous Q24H  . folic acid  1 mg Oral Daily  . furosemide  40 mg Oral Daily  . multivitamin  1 tablet Oral Daily  . multivitamin with minerals  1 tablet Oral Daily  . pantoprazole  40 mg Oral BID  . potassium chloride  20 mEq Per Tube Q4H  . propranolol  10 mg Oral BID  . sodium chloride flush  3 mL Intravenous Once  . spironolactone  100 mg Oral Daily  . thiamine  100 mg Oral Daily      Otelia Santee, MD 06/05/2019, 9:00 AM

## 2019-06-06 ENCOUNTER — Telehealth: Payer: Self-pay

## 2019-06-06 DIAGNOSIS — E871 Hypo-osmolality and hyponatremia: Secondary | ICD-10-CM

## 2019-06-06 LAB — BASIC METABOLIC PANEL
Anion gap: 6 (ref 5–15)
BUN: 15 mg/dL (ref 6–20)
CO2: 27 mmol/L (ref 22–32)
Calcium: 7.4 mg/dL — ABNORMAL LOW (ref 8.9–10.3)
Chloride: 95 mmol/L — ABNORMAL LOW (ref 98–111)
Creatinine, Ser: 0.88 mg/dL (ref 0.61–1.24)
GFR calc Af Amer: >60 mL/min (ref >60)
GFR calc non Af Amer: >60 mL/min (ref >60)
Glucose, Bld: 103 mg/dL — ABNORMAL HIGH (ref 70–99)
Potassium: 3.5 mmol/L (ref 3.5–5.1)
Sodium: 128 mmol/L — ABNORMAL LOW (ref 135–145)

## 2019-06-06 LAB — GLUCOSE, CAPILLARY: Glucose-Capillary: 105 mg/dL — ABNORMAL HIGH (ref 70–99)

## 2019-06-06 NOTE — Progress Notes (Signed)
Alerted Dr. Lynetta Mare that patient's BP was 104/50. Md stated to go ahead and give lasix, propranalol, and aldactone as scheduled. Will monitor patient closely.

## 2019-06-06 NOTE — Telephone Encounter (Signed)
Patient due for 2 week F/U   B12 and Iron panel. Patient is in hospital. Will touch base next week with patient on getting those labs.

## 2019-06-06 NOTE — TOC Initial Note (Signed)
Transition of Care [facility]) - Initial/Assessment Note    Patient Details  Name: Shawn Martin MRN: [id number] Date of Birth: September 15, 1975  [facility] CM/SW Contact:    Marilu Favre, RN Phone Number: 06/06/2019, 11:04 AM  Clinical Narrative:                 Patient from home with wife. Just started a new job and will have insurance in 30 days.   Provided [facility] information for patient to call to establish care( they require patient to call). And explained Transitions of Care Pharmacy and [facility] program.   Will continue to follow . Emailed Development worker, community   Expected Discharge Plan: Home/Self Care     Patient Goals and CMS Choice Patient states their goals for this hospitalization and ongoing recovery are:: to return to home CMS Medicare.gov Compare Post Acute Care list provided to:: Patient Choice offered to / list presented to : NA  Expected Discharge Plan and Services Expected Discharge Plan: Home/Self Care In-house Referral: Financial Counselor, PCP / [facility] Discharge Planning Services: [facility], [facility], Medication Assistance   Living arrangements for the past 2 months: Single Family Home                 DME Arranged: N/A DME Agency: NA       HH Arranged: NA          Prior Living Arrangements/Services Living arrangements for the past 2 months: Single Family Home Lives with:: Spouse Patient language and need for interpreter reviewed:: Yes Do you feel safe going back to the place where you live?: Yes      Need for Family Participation in Patient Care: Yes (Comment) Care giver support system in place?: Yes (comment)   Criminal Activity/Legal Involvement Pertinent to Current Situation/Hospitalization: No - Comment as needed  Activities of Daily Living Home Assistive Devices/Equipment: None ADL Screening (condition at time of admission) Patient's cognitive ability adequate to safely complete daily  activities?: Yes Is the patient deaf or have difficulty hearing?: No Does the patient have difficulty seeing, even when wearing glasses/contacts?: No Does the patient have difficulty concentrating, remembering, or making decisions?: No Patient able to express need for assistance with ADLs?: Yes Does the patient have difficulty dressing or bathing?: No Independently performs ADLs?: Yes (appropriate for developmental age) Does the patient have difficulty walking or climbing stairs?: No Weakness of Legs: None Weakness of Arms/Hands: None  Permission Sought/Granted   Permission granted to share information with : No              Emotional Assessment Appearance:: Appears stated age Attitude/Demeanor/Rapport: Engaged Affect (typically observed): Accepting Orientation: : Oriented to Self, Oriented to Place, Oriented to  Time, Oriented to Situation Alcohol / Substance Use: Not Applicable Psych Involvement: No (comment)  Admission diagnosis:  Hyponatremia [E87.1] Ascites [R18.8] Cirrhosis of liver with ascites (HCC) [K74.60, R18.8] Ascites due to alcoholic cirrhosis ([place]) [L38.10] Patient Active Problem List   Diagnosis Date Noted  . Hyponatremia 06/03/2019  . Alcohol dependence ([place]) 06/03/2019  . Symptomatic anemia 06/03/2019  . Ascites due to alcoholic cirrhosis ([place]) 17/51/0258  . Alcoholic cirrhosis of liver with ascites ([place]) 01/28/2019  . Screening for viral disease 01/28/2019   PCP:  Patient, No Pcp Per Pharmacy:   [facility], [place] [address] [place] [address] Phone: [phone] Fax: [phone]  [facility] [place] [place] -  [address] [place] [address] Phone: [phone] Fax: [phone]     Social Determinants of Health (SDOH) Interventions    Readmission Risk Interventions No flowsheet data found.

## 2019-06-06 NOTE — Discharge Instructions (Signed)
Alcoholic Liver Disease  Alcoholic liver disease is liver damage that is caused by drinking a lot of alcohol for a long time. If you have this disease, you must stop drinking alcohol. Follow these instructions at home:   Do not drink alcohol. Follow your treatment plan. Work with your doctor if you need help.  Think about joining an alcohol support group.  Take over-the-counter and prescription medicines only as told by your doctor. These include vitamins.  Do not use medicines or eat foods that have alcohol in them, unless your doctor says that it is safe.  Follow instructions from your doctor about eating a healthy diet.  Keep all follow-up visits as told by your doctor. This is important. Contact a doctor if:  You get a fever.  Your skin starts to look more yellow, pale, or dark.  You get headaches. Get help right away if:  You throw up (vomit) blood.  You have bright red blood in your poop (stool).  Your poop looks black or looks like tar.  You have trouble: ? Thinking. ? Walking. ? Balancing. ? Breathing. Summary  Alcoholic liver disease is liver damage that is caused by drinking a lot of alcohol for a long time.  If you have this disease, you must stop drinking alcohol. Follow your treatment plan, and work with your doctor as needed.  Think about joining an alcohol support group. This information is not intended to replace advice given to you by your health care provider. Make sure you discuss any questions you have with your health care provider. Document Revised: 07/06/2018 Document Reviewed: 12/05/2016 Elsevier Patient Education  [place].

## 2019-06-06 NOTE — Telephone Encounter (Signed)
Have attempted multiple times to return patient's call. He is currently admitted to the hospital

## 2019-06-06 NOTE — Discharge Summary (Signed)
Physician Discharge Summary  Patient ID: Shawn Martin MRN: [id number] DOB/AGE: [birth year] 44 y.o.  Admit date: 06/03/2019 Discharge date: 06/06/2019  Problem List Active Problems:   Ascites due to alcoholic cirrhosis (HCC)   Alcoholic cirrhosis of liver with ascites (HCC)   Hyponatremia   Alcohol dependence (HCC)   Symptomatic anemia  HPI: 44 yo M PMH Alcohol abuse, alcoholic cirrhosis, esophageal varices, portal hypertensive gastropathy, celiac disease who presented to ED with 1-2 week hx increasing abdominal discomfort, bloating and generalized weakness, with associated BLE edema. Abdominal discomfort was described as crampy in nature. He denied nausea, vomiting, diarrhea, blood in stool or emesis. He denied fever, chills, chest pain, SOB confusion. Endorses EtOH use, denies illicit drug use. Endorses following low sodium diet and compliance with home lasix, spironolactone. Endorses history of paracentesis, last performed 05/25/2019 with 9.6L removed at that time.   In ED, Sodium 117, Cr 1.01, AST 142, ALT 64, Alk phos 119, TBili 4.0, Lipase 52, Albumin 2.3, WBC 5.6, Hgb 10.2, Plt 203. Hemodynamically stable and on room air in ED.Patient initially admitted to Hospitalist service with GI and nephrology to consult, however due to hypertonic saline, patient then admitted to ICU.   Hospital Course/discharge summary: 06/03/19 - admitted to ICU for hypertonic saline. GI and nephrology consulted.  3/6 - Paracentesis with IR, with 5L clear yellow fluid  3/7 - transferred out of ICU. Discontinued hypertonic saline 3/8 - Hemodynamically stable and on Room air.  Sodium 128. Nephrology followed along with patient hospitalization, in agreement of discharging on Lasix 68m daily. Importance of compliance with medication regiment, fluid restriction, and alcohol avoidance.   Labs at discharge Lab Results  Component Value Date   CREATININE 0.88 06/06/2019   BUN 15 06/06/2019   NA 128 (L) 06/06/2019   K  3.5 06/06/2019   CL 95 (L) 06/06/2019   CO2 27 06/06/2019   Lab Results  Component Value Date   WBC 5.8 06/03/2019   HGB 10.2 (L) 06/03/2019   HCT 29.0 (L) 06/03/2019   MCV 97.0 06/03/2019   PLT 218 06/03/2019   Lab Results  Component Value Date   ALT 64 (H) 06/03/2019   AST 142 (H) 06/03/2019   ALKPHOS 119 06/03/2019   BILITOT 4.0 (H) 06/03/2019   Lab Results  Component Value Date   INR 1.3 (H) 06/03/2019   INR 1.3 (H) 05/24/2019   INR 1.2 (H) 01/28/2019    Current radiology studies [place]  Result Date: 06/04/2019 INDICATION: Patient with history of cirrhosis, recurrent ascites. Request is made for diagnostic and therapeutic paracentesis. EXAM: ULTRASOUND GUIDED DIAGNOSTIC AND THERAPEUTIC PARACENTESIS MEDICATIONS: 10 mL 1% lidocaine COMPLICATIONS: None immediate. PROCEDURE: Informed written consent was obtained from the patient after a discussion of the risks, benefits and alternatives to treatment. A timeout was performed prior to the initiation of the procedure. Initial ultrasound scanning demonstrates a large amount of ascites within the left lateral abdomen. The left lateral abdomen was prepped and draped in the usual sterile fashion. 1% lidocaine was used for local anesthesia. Following this, a 19 gauge, 10-cm, Yueh catheter was introduced. An ultrasound image was saved for documentation purposes. The paracentesis was performed. The catheter was removed and a dressing was applied. The patient tolerated the procedure well without immediate post procedural complication. FINDINGS: A total of approximately 5.0 liters of yellow fluid was removed. Samples were sent to the laboratory as requested by the clinical team. IMPRESSION: Successful ultrasound-guided paracentesis yielding 5.0 liters of peritoneal fluid. Read by:  Brynda Greathouse PA-C Electronically Signed   By: Sandi Mariscal M.D.   On: 06/04/2019 15:07     Physical Exam: General: Chronically ill appearing adult lying in  bed, in NAD HEENT: [place]/AT, MM pink/moist, PERRL, some evidence of temporal waisting  Neuro: Alert and oriented x3, non-focal  CV: s1s2 regular rate and rhythm, no murmur, rubs, or gallops,  PULM:  Clear to ascultation bilaterally, no increased work of breathging GI: soft, bowel sounds active in all 4 quadrants, non-tender, distended with moderated ascites  Extremities: warm/dry, 2+ pitting LE edema  Skin: no rashes or lesions   Assessment and Plan:  Acute on chronic hyponatremia -symptomatic. Acute component due to hypervolemia. Chronic in setting of EtOH use disorder -improved, off of hypertonic saline and stable 127-128 Plan Upon discharge continue home lasix, spironolactone Of note diuretic regiment may need dose adjusted with warm weather Alcohol cessation counseling has been provided  EtOH use disorder -last consumption about 7 days ago Plan Alcohol cessation counseling has been provided by multiple provider teams   Alcohol related cirrhosis, with history of esophageal varices Plan Continue home inderol, lasix, spironolactone at discharge   Ascites in setting of alcoholic cirrhosis  -S/p paracentesis  With 5L off. no growth from peritoneal fluid at 2 days -Ascites is likely to re-accumulate and patient seems to require intermittent paracentesis. Plan Stable at this time for discharge   Normocytic anemia, chronic, without evidence of active bleeding Plan  -Stable. Follow up with primary care provider   Disposition: Patient stable for discharge home    Allergies as of 06/06/2019      Reactions   Gluten Meal       Medication List    TAKE these medications   furosemide 40 MG tablet Commonly known as: LASIX Take 40 mg by mouth daily.   multivitamin with minerals Tabs tablet Take 1 tablet by mouth daily.   propranolol 10 MG tablet Commonly known as: INDERAL TAKE 1 TABLET BY MOUTH TWICE DAILY   spironolactone 100 MG tablet Commonly known as: ALDACTONE Take  100 mg by mouth daily.      Follow-up Information    [facility]. Schedule an appointment as soon as possible for a visit.   Specialty: Primary Care Contact information: [place] [phone]          Discharged Condition: stable  Time spent on discharge greater than 35 minutes.  Vital signs at Discharge. Temp:  [98.6 F (37 C)-98.9 F (37.2 C)] 98.9 F (37.2 C) (03/08 0511) Pulse Rate:  [87-91] 87 (03/08 0511) Resp:  [18-20] 18 (03/08 0511) BP: (104-125)/(50-81) 104/50 (03/08 0511) SpO2:  [94 %-99 %] 94 % (03/08 0511)  Signed: Johnsie Cancel, NP-C [facility] Contact / Pager information can be found on Amion  06/06/2019, 12:17 PM

## 2019-06-06 NOTE — Telephone Encounter (Signed)
-----   Message from Hughie Closs, RN sent at 05/25/2019  2:49 PM EST ----- Call pt. And ask him to come in for labs. Already in Epic. 2 weeks = 06/08/19

## 2019-06-06 NOTE — Progress Notes (Addendum)
Discharge instructions given to patient. All questions answered. Breathing even and unlabored. No distress noted. All safety measures in place. Tele box removed and returned to Financial controller. IVs removed. Awaiting transport. All belongings gathered and taken with patient.

## 2019-06-06 NOTE — Progress Notes (Signed)
Shawn Martin Progress Note   44 y.o.maleAlcoholic cirrhosis, esophageal varices, portal hypertension presenting with increasing abdominal bloating, lower extremity swelling and weakness over the past 1-2 weeks. He denies any f/c/n/v/ dyspnea/ CP/ myalgias. He last had a LVP on 05/25/19 with 9.6L removed. Heavy drinking w/ relapse.  He denies any NSAID use or new medications and has been compliant with his medications but states that he has recently changed jobs. He denies any illicit drug use and states that he is compliant with a low sodium diet. Serum Na was found to be 117 in the ED  Assessment/ Plan:   1. Hyponatremia - hypervolemic variety in the setting of alcoholic cirrhosis. Urine osm 411- urine sodium 18-  Transient treatment with 3% saline- now on lasix with fluid restriction- tolvaptan contraindicated in liver disease -  Sodium steady rising-  I would suspect might not get much better-  130 is probably going to be his baseline.  I would have no issue with discharge on lasix 40 mg daily - still has edema-  Explained the importance of compliance with meds and fluid restriction   2. Liver cirrhosis w/ h/o EV -  LVP 3/6 with 5L out. 3. Alcohol abuse - has relapsed a week ago. 4. Anemia  Renal will sign off-  Does not need renal specific follow up-  Needs to continue with fluid restriction and lasix   Subjective:   Sodium up to 128 this AM-  On a steady rise really since 3/5- at least 1200 of UOP yesterday    Objective:   BP (!) 104/50 (BP Location: Left Arm)   Pulse 87   Temp 98.9 F (37.2 C) (Oral)   Resp 18   Ht 5' 11"  (1.803 m)   Wt 104.3 kg   SpO2 94%   BMI 32.08 kg/m   Intake/Output Summary (Last 24 hours) at 06/06/2019 1043 Last data filed at 06/06/2019 8527 Gross per 24 hour  Intake 762 ml  Output 500 ml  Net 262 ml   Weight change:   Physical Exam: GEN: NAD, A&Ox3, NCAT HEENT:  EOMI NECK: Supple, no thyromegaly LUNGS: CTA B/L no rales, rhonchi  or wheezing CV: RRR, No M/R/G ABD: Distended with ascites EXT: 2-3+  lower extremity edema  Imaging: US Paracentesis  Result Date: 06/04/2019 INDICATION: Patient with history of cirrhosis, recurrent ascites. Request is made for diagnostic and therapeutic paracentesis. EXAM: ULTRASOUND GUIDED DIAGNOSTIC AND THERAPEUTIC PARACENTESIS MEDICATIONS: 10 mL 1% lidocaine COMPLICATIONS: None immediate. PROCEDURE: Informed written consent was obtained from the patient after a discussion of the risks, benefits and alternatives to treatment. A timeout was performed prior to the initiation of the procedure. Initial ultrasound scanning demonstrates a large amount of ascites within the left lateral abdomen. The left lateral abdomen was prepped and draped in the usual sterile fashion. 1% lidocaine was used for local anesthesia. Following this, a 19 gauge, 10-cm, Yueh catheter was introduced. An ultrasound image was saved for documentation purposes. The paracentesis was performed. The catheter was removed and a dressing was applied. The patient tolerated the procedure well without immediate post procedural complication. FINDINGS: A total of approximately 5.0 liters of yellow fluid was removed. Samples were sent to the laboratory as requested by the clinical team. IMPRESSION: Successful ultrasound-guided paracentesis yielding 5.0 liters of peritoneal fluid. Read by: Brynda Greathouse PA-C Electronically Signed   By: Sandi Mariscal M.D.   On: 06/04/2019 15:07    Labs: BMET Recent Labs  Lab 06/03/19 1632 06/03/19 1835  06/03/19 2032 06/03/19 2032 06/04/19 0009 06/04/19 0009 06/04/19 0357 06/04/19 0357 06/04/19 0827 06/04/19 0827 06/04/19 1357 06/04/19 1357 06/04/19 1822 06/04/19 2156 06/05/19 0207 06/05/19 0648 06/05/19 1027 06/05/19 1423 06/06/19 0142  NA 122*   < > 116*   < > 117*   < > 119*   < > 119*   < > 121*   < > 123* 123* 126* 127* 127* 128* 128*  K  --    < > 3.7  --  3.8  --  3.9  --  3.3*  --  3.8   --   --   --  3.5  --   --   --  3.5  CL  --    < > 81*  --  82*  --  84*  --  83*  --  88*  --   --   --  91*  --   --   --  95*  CO2  --    < > 26  --  25  --  27  --  26  --  27  --   --   --  29  --   --   --  27  GLUCOSE  --    < > 145*  --  125*  --  112*  --  116*  --  118*  --   --   --  117*  --   --   --  103*  BUN  --    < > 14  --  15  --  15  --  14  --  13  --   --   --  13  --   --   --  15  CREATININE  --    < > 0.94  --  0.97  --  0.96  --  0.98  --  0.73  --   --   --  0.90  --   --   --  0.88  CALCIUM  --    < > 7.9*  --  7.6*  --  7.7*  --  7.6*  --  7.6*  --   --   --  7.4*  --   --   --  7.4*  PHOS 4.0  --   --   --   --   --   --   --   --   --   --   --   --   --   --   --   --   --   --    < > = values in this interval not displayed.   CBC Recent Labs  Lab 06/03/19 0438 06/03/19 1632  WBC 5.6 5.8  HGB 10.6* 10.2*  HCT 30.7* 29.0*  MCV 96.8 97.0  PLT 230 218    Medications:    . Chlorhexidine Gluconate Cloth  6 each Topical Daily  . enoxaparin (LOVENOX) injection  40 mg Subcutaneous Q24H  . folic acid  1 mg Oral Daily  . furosemide  40 mg Oral Daily  . multivitamin  1 tablet Oral Daily  . multivitamin with minerals  1 tablet Oral Daily  . pantoprazole  40 mg Oral BID  . propranolol  10 mg Oral BID  . spironolactone  100 mg Oral Daily  . thiamine  100 mg Oral Daily      Omesha Bowerman A Temprence Rhines  06/06/2019, 10:43 AM

## 2019-06-06 NOTE — Plan of Care (Signed)

## 2019-06-07 ENCOUNTER — Telehealth: Payer: Self-pay | Admitting: Gastroenterology

## 2019-06-07 LAB — PATHOLOGIST SMEAR REVIEW

## 2019-06-07 NOTE — Telephone Encounter (Signed)
Patient is calling with some concerns was just seen at the hospital and he would like to know if he still needs to come in for his appt on 06/09/19

## 2019-06-07 NOTE — Telephone Encounter (Signed)
Pt states he was in the hospital last week. He had a para while in the hospital but states the fluid is still present and he thinks he may need another para. Pt wants to know if he needs to keep his appt with [name] on Thursday. Also mentioned that the doctor mentioned him having labs every other week. Please advise.

## 2019-06-07 NOTE — Telephone Encounter (Signed)
He should definitely keep his appointment with [name] on Thursday. He should have a BMET drawn in the lab before his visit with her to ensure stable Na He should be on his lasix / aldactone.  Hopefully he can wait to see [name] prior to scheduling paracentesis but if he does not think he can wait, ascites getting tight, and wants to get something booked with IR can refer for large volume paracentesis with albumin given if > 5 L. Thanks

## 2019-06-08 ENCOUNTER — Other Ambulatory Visit: Payer: Self-pay

## 2019-06-08 DIAGNOSIS — K7460 Unspecified cirrhosis of liver: Secondary | ICD-10-CM

## 2019-06-08 NOTE — Telephone Encounter (Signed)
Spoke with pt and he is aware. Order in epic for lab. States he can wait on paracentesis until visit tomorrow and it can be addressed.

## 2019-06-09 ENCOUNTER — Encounter: Payer: Self-pay | Admitting: Nurse Practitioner

## 2019-06-09 ENCOUNTER — Other Ambulatory Visit: Payer: Self-pay

## 2019-06-09 ENCOUNTER — Other Ambulatory Visit (INDEPENDENT_AMBULATORY_CARE_PROVIDER_SITE_OTHER): Payer: Self-pay

## 2019-06-09 ENCOUNTER — Ambulatory Visit (INDEPENDENT_AMBULATORY_CARE_PROVIDER_SITE_OTHER): Payer: Self-pay | Admitting: Nurse Practitioner

## 2019-06-09 VITALS — BP 122/80 | HR 87 | Temp 97.9°F | Ht 72.0 in | Wt 230.1 lb

## 2019-06-09 DIAGNOSIS — E8770 Fluid overload, unspecified: Secondary | ICD-10-CM

## 2019-06-09 DIAGNOSIS — R188 Other ascites: Secondary | ICD-10-CM

## 2019-06-09 DIAGNOSIS — K7460 Unspecified cirrhosis of liver: Secondary | ICD-10-CM

## 2019-06-09 DIAGNOSIS — E871 Hypo-osmolality and hyponatremia: Secondary | ICD-10-CM

## 2019-06-09 LAB — BASIC METABOLIC PANEL
BUN: 14 mg/dL (ref 6–23)
CO2: 31 mEq/L (ref 19–32)
Calcium: 8 mg/dL — ABNORMAL LOW (ref 8.4–10.5)
Chloride: 93 mEq/L — ABNORMAL LOW (ref 96–112)
Creatinine, Ser: 0.79 mg/dL (ref 0.40–1.50)
GFR: 105.66 mL/min (ref >60.00)
Glucose, Bld: 114 mg/dL — ABNORMAL HIGH (ref 70–99)
Potassium: 3.6 mEq/L (ref 3.5–5.1)
Sodium: 127 mEq/L — ABNORMAL LOW (ref 135–145)

## 2019-06-09 LAB — B12 AND FOLATE PANEL
Folate: 15 ng/mL (ref >5.9)
Vitamin B-12: 791 pg/mL (ref 211–911)

## 2019-06-09 LAB — CULTURE, BODY FLUID W GRAM STAIN -BOTTLE: Culture: NO GROWTH

## 2019-06-09 NOTE — Patient Instructions (Addendum)
If you are age 46 or older, your body mass index should be between 23-30. Your Body mass index is 31.21 kg/m. If this is out of the aforementioned range listed, please consider follow up with your Primary Care Provider.  If you are age 39 or younger, your body mass index should be between 19-25. Your Body mass index is 31.21 kg/m. If this is out of the aformentioned range listed, please consider follow up with your Primary Care Provider.   Your provider has requested that you go to the basement level for lab work in one week, June 17, 2019. Press "B" on the elevator. The lab is located at the first door on the left as you exit the elevator. March 19,[address].  You have been scheduled for an abdominal paracentesis at [facility] radiology (1st floor of hospital) on 06/10/19 at 11 am. Please arrive at least 15 minutes prior to your appointment time for registration. Should you need to reschedule this appointment for any reason, please call our office at [phone].  You have been scheduled for  Transjugular Liver Biopsy at [facility] on 06/22/19 at 1 pm.  Please arrive at 11 am. Nothing to eat or drink after 7 am. You may eat breakfast at 6 am as long as you are done by 7 am. You must have a driver with you.  Also you must have someone to stay the night with you after procedure.  INCREASE LASIX TO 80 MG DAILY.  INCREASE SPIRONOLACTONE TO 200 MG DAILY.  We will contact you regarding echocardiogram.  Thank you for choosing me and [facility].   Tye Savoy, NP

## 2019-06-09 NOTE — Progress Notes (Signed)
IMPRESSION and PLAN:    Jalal Fain [place] is a 44 y.o. male with a pmh significant for, not necessarily limited to celiac disease, cirrhosis     #Decompensated cirrhosis with large volume ascites requiring serial LVPs --He feels miserable with distention from reaccumulation of ascites since last LVP ( 5 liters)  5 days ago.  He had labs yesterday, sodium stable at 127, renal function normal.   --Will increase Lasix to 80 mg daily and Aldactone to 200 mg daily. --Will arrange for repeat LVP, 5 L maximum --CMP, CBC and INR in 1 week --Despite lack of insurance at the time, wife and patient would like to proceed with transjugular liver biopsy.  Hopefully this will give [place] an answer as to whether patient has autoimmune hepatitis contributing to cirrhoss --Given the massive volume overload will obtain echocardiogram to rule out component of heart failure. --Follow-up in clinic with Dr. Havery Moros  #Acute on chronic hyponatremia --Sodium improved from 116 to 128 by time of hospital discharge on 06/06/2019.  --Currently on Lasix 40 mg and Aldactone 100 mg daily.  Compliant with 2 g sodium diet  HPI:    Primary GI: Dr. Havery Moros  Chief complaint : hospital follow up for low sodium  **History comes from the chart and patient  Patient is a 44 year old male known to Dr. Havery Moros.  He has a history of alcohol abuse.  We follow him for cirrhosis, probably alcohol related but possibly a component of autoimmune hepatitis.  We have held off on treating with steroids until liver biopsy could be obtained.  Last seen 05/24/2019 for evaluation of decompensated cirrhosis.  He was sent for LVP, 9.6 L removed.  No SBP.  We doubled his diuretics to Lasix mgs and Aldactone 100 mgs. He was having intermittent rectal bleeding at the time colonoscopy was planned, not yet done due to job change /insurance problems.  For now bleeding has resolved.   Patient was hospitalized 06/03/19 through 06/06/2019.   He presented to the ED with swelling, found to have earlier this month for acute on chronic hyponatremia with sodium of 116.  He was admitted to ICU for hypertonic saline, nephrology evaluated.  While admitted he underwent another LVP with removal of 5 L.  Was discharged home on same diuretic dose.  Patient says he has not had any alcohol since prior to last visit with [place].  Despite diuretics and 5 L LVP over the weekend, his abdomen is markedly distended.  Wife says patient immediately started to reaccumulate ascites following the LVP.  He reports compliance with low-sodium diet and gluten-free diet. Since hospital discharge patient has basically been confined to bed.  He recently started a new job but has been able to work  Review of systems:     No chest pain, no SOB, no fevers, no urinary sx   Past Medical History:  Diagnosis Date  . Celiac disease   . Hepatic cirrhosis ([place])   . Hypertension     Patient's surgical history, family medical history, social history, medications and allergies were all reviewed in Epic   Serum creatinine: 0.88 mg/dL 06/06/19 0142 Estimated creatinine clearance: 130.3 mL/min  Current Outpatient Medications  Medication Sig Dispense Refill  . furosemide (LASIX) 40 MG tablet Take 40 mg by mouth daily.    . Multiple Vitamin (MULTIVITAMIN WITH MINERALS) TABS tablet Take 1 tablet by mouth daily.    . propranolol (INDERAL) 10 MG tablet TAKE 1  TABLET BY MOUTH TWICE DAILY (Patient not taking: Reported on 06/03/2019) 180 tablet 1  . spironolactone (ALDACTONE) 100 MG tablet Take 100 mg by mouth daily.     No current facility-administered medications for this visit.    Physical Exam:     BP 122/80 (BP Location: Left Arm, Patient Position: Sitting, Cuff Size: Normal)   Pulse 87   Temp 97.9 F (36.6 C) (Oral)   Ht 6' (1.829 m)   Wt 230 lb 2 oz (104.4 kg)   BMI 31.21 kg/m   GENERAL:  Pleasant male in NAD PSYCH: : Cooperative, normal affect CARDIAC:  RRR,  heard,  2+ BLE PULM: Normal respiratory effort, lungs CTA bilaterally, no wheezing ABDOMEN:  Largely distended, soft, nontender.Normal bowel sounds SKIN:  turgor, no lesions seen Musculoskeletal:  Muscle wasting NEURO: Alert and oriented x 3, no focal neurologic deficits. No asterixis   Tye Savoy , NP 06/09/2019, 3:42 PM

## 2019-06-10 ENCOUNTER — Ambulatory Visit (HOSPITAL_COMMUNITY)
Admission: RE | Admit: 2019-06-10 | Discharge: 2019-06-10 | Disposition: A | Discharge status: Home / Self Care | Payer: Self-pay | Source: Ambulatory Visit | Attending: Nurse Practitioner | Admitting: Nurse Practitioner

## 2019-06-10 ENCOUNTER — Other Ambulatory Visit: Payer: Self-pay

## 2019-06-10 ENCOUNTER — Encounter: Payer: Self-pay | Admitting: Nurse Practitioner

## 2019-06-10 DIAGNOSIS — K7460 Unspecified cirrhosis of liver: Secondary | ICD-10-CM | POA: Insufficient documentation

## 2019-06-10 DIAGNOSIS — E8770 Fluid overload, unspecified: Secondary | ICD-10-CM | POA: Insufficient documentation

## 2019-06-10 DIAGNOSIS — R188 Other ascites: Secondary | ICD-10-CM | POA: Insufficient documentation

## 2019-06-10 HISTORY — PX: IR PARACENTESIS: IMG2679

## 2019-06-10 LAB — BODY FLUID CELL COUNT WITH DIFFERENTIAL
Eos, Fluid: 0 %
Lymphs, Fluid: 90 %
Monocyte-Macrophage-Serous Fluid: 9 % — ABNORMAL LOW (ref 50–90)
Neutrophil Count, Fluid: 1 % (ref 0–25)
Total Nucleated Cell Count, Fluid: 64 cu mm (ref 0–1000)

## 2019-06-10 LAB — IRON,TIBC AND FERRITIN PANEL
%SAT: 8 % (calc) — ABNORMAL LOW (ref 20–48)
Ferritin: 173 ng/mL (ref 38–380)
Iron: 21 ug/dL — ABNORMAL LOW (ref 50–180)
TIBC: 250 mcg/dL (calc) (ref 250–425)

## 2019-06-10 LAB — GRAM STAIN

## 2019-06-10 MED ORDER — LIDOCAINE HCL 1 % IJ SOLN
INTRAMUSCULAR | Status: AC
  Filled 2019-06-10: qty 20

## 2019-06-10 MED ORDER — LIDOCAINE HCL 1 % IJ SOLN
INTRAMUSCULAR | Status: DC | PRN
  Administered 2019-06-10: 10 mL

## 2019-06-10 NOTE — Procedures (Signed)
PROCEDURE SUMMARY:  Successful US guided paracentesis from left lateral abdomen.  Yielded 5.0 liters of yellow, cloudy fluid.  No immediate complications.  Pt tolerated well.   Specimen was sent for labs.  EBL < 18m  KDocia BarrierPA-C 06/10/2019 12:02 PM

## 2019-06-10 NOTE — Progress Notes (Signed)
Agree with assessment and plan as outlined. Difficult situation. Suspect cirrhosis related to alcoholism however labs concerning for possible autoimmune hepatitis, and he also has had celiac disease diagnosed recently which could have contributed. Having worsening issues with ascites / volume leading to hospitalization and had therapy for hyponatremia per nephrology. Diuretics have been increased and Na and renal function okay but he continues to be volume overloaded with edema / ascites. Will increase lasix to 76m BID and aldactone 2066m He will continue low Na diet and plan for repeat BMET next week. Will plan for another paracentesis to keep him comfortable but limit it at 5L given we are increasing his diuretics.  He had a relapse of alcohol recently and must abstain completely from alcohol moving forward. Will get Echo to make sure no component of cardiomyopathy given his volume status. Once volume status is improved can proceed with his colonoscopy. He may also warrant banding of varices, we have held his propranolol due to ascites. Transjugular liver biopsy planned to rule out autoimmune hepatitis.  Shawn Bloodgoodcan you ensure the patient has stopped his propranolol? thanks

## 2019-06-13 ENCOUNTER — Telehealth: Payer: Self-pay | Admitting: Nurse Practitioner

## 2019-06-13 ENCOUNTER — Other Ambulatory Visit: Payer: Self-pay

## 2019-06-13 ENCOUNTER — Other Ambulatory Visit (INDEPENDENT_AMBULATORY_CARE_PROVIDER_SITE_OTHER): Payer: Self-pay

## 2019-06-13 DIAGNOSIS — E8770 Fluid overload, unspecified: Secondary | ICD-10-CM

## 2019-06-13 DIAGNOSIS — K7031 Alcoholic cirrhosis of liver with ascites: Secondary | ICD-10-CM

## 2019-06-13 DIAGNOSIS — K7460 Unspecified cirrhosis of liver: Secondary | ICD-10-CM

## 2019-06-13 DIAGNOSIS — R188 Other ascites: Secondary | ICD-10-CM

## 2019-06-13 LAB — PATHOLOGIST SMEAR REVIEW

## 2019-06-13 LAB — PROTIME-INR
INR: 1.3 ratio — ABNORMAL HIGH (ref 0.8–1.0)
Prothrombin Time: 14.3 s — ABNORMAL HIGH (ref 9.6–13.1)

## 2019-06-13 LAB — CBC
HCT: 34.4 % — ABNORMAL LOW (ref 39.0–52.0)
Hemoglobin: 11.6 g/dL — ABNORMAL LOW (ref 13.0–17.0)
MCHC: 33.9 g/dL (ref 30.0–36.0)
MCV: 99.3 fl (ref 78.0–100.0)
Platelets: 205 10*3/uL (ref 150.0–400.0)
RBC: 3.46 Mil/uL — ABNORMAL LOW (ref 4.22–5.81)
RDW: 16.4 % — ABNORMAL HIGH (ref 11.5–15.5)
WBC: 3.6 10*3/uL — ABNORMAL LOW (ref 4.0–10.5)

## 2019-06-13 LAB — COMPREHENSIVE METABOLIC PANEL
ALT: 44 U/L (ref 0–53)
AST: 74 U/L — ABNORMAL HIGH (ref 0–37)
Albumin: 2.2 g/dL — ABNORMAL LOW (ref 3.5–5.2)
Alkaline Phosphatase: 107 U/L (ref 39–117)
BUN: 15 mg/dL (ref 6–23)
CO2: 27 mEq/L (ref 19–32)
Calcium: 7.6 mg/dL — ABNORMAL LOW (ref 8.4–10.5)
Chloride: 95 mEq/L — ABNORMAL LOW (ref 96–112)
Creatinine, Ser: 0.93 mg/dL (ref 0.40–1.50)
GFR: 87.52 mL/min (ref >60.00)
Glucose, Bld: 120 mg/dL — ABNORMAL HIGH (ref 70–99)
Potassium: 3.9 mEq/L (ref 3.5–5.1)
Sodium: 127 mEq/L — ABNORMAL LOW (ref 135–145)
Total Bilirubin: 1 mg/dL (ref 0.2–1.2)
Total Protein: 6 g/dL (ref 6.0–8.3)

## 2019-06-13 NOTE — Telephone Encounter (Signed)
Pt requested a call back to discuss his symptoms and medications.  He stated that he has been unable to sleep.

## 2019-06-13 NOTE — Telephone Encounter (Signed)
Beth he was just seen the other day and I think [name] had ordered a paracentesis for him at that time, do you know when it is scheduled for? If he does not feel like he can breath due to this and is in that much distress he will need to go to the hospital and be admitted. If he feels well enough that he can wait for paracentesis any way we can get this done in the next 24 hours? Was supposed to have 5L drawn off and BMET for this week as well.   Can you keep me posted on how he is doing and if we can get this done for him, or if he needs to go to the hospital. Thanks

## 2019-06-13 NOTE — Telephone Encounter (Signed)
Patient calls with complaints of being "blown up again."  States he can not sleep because he is uncomfortable. "Yesterday my muscles were cramping. My hands, my legs." Feels like he cannot breath well when he is lying down. Confirmed he is taking Lasix 80 mg and Aldactone 200 mg daily. States low sodium diet. Does not weigh daily. Patient wants paracentesis.

## 2019-06-13 NOTE — Telephone Encounter (Signed)
Thanks [employer]. Yes let's have him get a BMET today if possible to ensure stable, and can you help coordinate with IR for paracentesis next 24-48 hours, another 5L again. Thanks

## 2019-06-13 NOTE — Telephone Encounter (Signed)
Dr Havery Moros, I overlooked that [name] last saw this patient. Thank you for responding. I can forward this note to her if preferred.  The patient had 5 liters taken by paracentesis on 06/10/19. He does not have another paracentesis scheduled. He said he is to come in for labs "some time this week, I don't know when." He had labs on 06/09/19 the day before his tap. The complaint of not breathing well is when he tries to lie down. Otherwise he can breathe, though he "feel full and tight." Is it okay to schedule another paracentesis for the next 24 to 48 hours? Would he need labs first since the diuretics were increased on 06/09/10?

## 2019-06-13 NOTE — Telephone Encounter (Signed)
Thanks Beth, I will let him know what to do with diuretics based on his labs. Thanks

## 2019-06-13 NOTE — Telephone Encounter (Signed)
The patient agrees to this plan. He will come this afternoon for the labs. The paracentesis will be on 06/15/19 at 1 pm in the [facility]. Will he need to hold the diuretics the day of the paracentesis?

## 2019-06-14 ENCOUNTER — Telehealth: Payer: Self-pay

## 2019-06-14 NOTE — Telephone Encounter (Signed)
Called patient back and let him know the reason Dr. Havery Moros is hesitant to take off more fluid with increasing diuretics, is to not send him into renal failure. But he will see how tomorrow's paracentesis goes and evaluate from there

## 2019-06-14 NOTE — Telephone Encounter (Signed)
Called patient and he says the cramping is in his legs and feet. I let him know Dr. Havery Moros suggest taking OTC magnesium supplement for the cramping and only tylenol for pain. Let him know his blood work looks good, from yesterday. And Dr. Havery Moros will make further recommendations after he gets the results from his paracentesis tomorrow. Patient is asking if the Paracentesis can be done more often than once a week, or if more than 5 liters can be taken off

## 2019-06-14 NOTE — Telephone Encounter (Signed)
Thanks Customer service manager. Yes in general we can take more than 5L off however since we increased his diuretics I have tried to avoid taking too much off as I don't want to cause renal failure which is the risk of doing that. Let's see how he does with this paracentesis and will plan on increasing diuretics further. We will need to repeat his renal function next week but will see how he does with paracentesis first. Thanks

## 2019-06-15 ENCOUNTER — Ambulatory Visit (HOSPITAL_COMMUNITY)
Admission: RE | Admit: 2019-06-15 | Discharge: 2019-06-15 | Disposition: A | Discharge status: Home / Self Care | Payer: Self-pay | Source: Ambulatory Visit | Attending: Gastroenterology | Admitting: Gastroenterology

## 2019-06-15 ENCOUNTER — Other Ambulatory Visit: Payer: Self-pay

## 2019-06-15 DIAGNOSIS — K7031 Alcoholic cirrhosis of liver with ascites: Secondary | ICD-10-CM | POA: Insufficient documentation

## 2019-06-15 HISTORY — PX: IR PARACENTESIS: IMG2679

## 2019-06-15 MED ORDER — LIDOCAINE HCL 1 % IJ SOLN
INTRAMUSCULAR | Status: AC
  Filled 2019-06-15: qty 20

## 2019-06-15 MED ORDER — ALBUMIN HUMAN 25 % IV SOLN
50.0000 g | Freq: Once | INTRAVENOUS | Status: DC
  Filled 2019-06-15: qty 200

## 2019-06-15 MED ORDER — LIDOCAINE HCL (PF) 1 % IJ SOLN
INTRAMUSCULAR | Status: AC | PRN
  Administered 2019-06-15: 10 mL

## 2019-06-15 NOTE — Procedures (Signed)
PROCEDURE SUMMARY:  Successful image-guided paracentesis from the right lower abdomen.  Yielded 5.0 liters of hazy pale yellow fluid.  No immediate complications.  EBL: zero Patient tolerated well.   Specimen was not sent for labs.  Please see imaging section of Epic for full dictation.  Joaquim Nam PA-C 06/15/2019 2:29 PM

## 2019-06-16 ENCOUNTER — Other Ambulatory Visit: Payer: Self-pay

## 2019-06-16 DIAGNOSIS — K7460 Unspecified cirrhosis of liver: Secondary | ICD-10-CM

## 2019-06-17 ENCOUNTER — Telehealth: Payer: Self-pay

## 2019-06-17 NOTE — Telephone Encounter (Signed)
Called patient back, and he states he is already starting to retain fluid and had a lot of cramping last night. I let him know that if he was developing these symptoms soon after his last paracentesis, that Dr Havery Moros wanted him to increase his diuretics. Asked him to increase his Lasix by  Adding a 40 mg dose in the afternoon to his existing 80 mg dose in the morning, and increase his Aldactone from 200 mg to 300 mg daily. He agreed. He is coming in Monday 06/20/19 for labs

## 2019-06-17 NOTE — Telephone Encounter (Signed)
Thanks Customer service manager. Difficult situation, hopefully he responds to higher dosing of diuretics and will await his labs Monday to ensure stable renal function / electrolytes. Thanks

## 2019-06-20 ENCOUNTER — Other Ambulatory Visit (INDEPENDENT_AMBULATORY_CARE_PROVIDER_SITE_OTHER): Payer: Self-pay

## 2019-06-20 DIAGNOSIS — R188 Other ascites: Secondary | ICD-10-CM

## 2019-06-20 DIAGNOSIS — K7460 Unspecified cirrhosis of liver: Secondary | ICD-10-CM

## 2019-06-20 LAB — BASIC METABOLIC PANEL
BUN: 17 mg/dL (ref 6–23)
CO2: 28 mEq/L (ref 19–32)
Calcium: 8 mg/dL — ABNORMAL LOW (ref 8.4–10.5)
Chloride: 96 mEq/L (ref 96–112)
Creatinine, Ser: 1.11 mg/dL (ref 0.40–1.50)
GFR: 71.35 mL/min (ref >60.00)
Glucose, Bld: 128 mg/dL — ABNORMAL HIGH (ref 70–99)
Potassium: 4.7 mEq/L (ref 3.5–5.1)
Sodium: 127 mEq/L — ABNORMAL LOW (ref 135–145)

## 2019-06-20 LAB — MAGNESIUM: Magnesium: 2 mg/dL (ref 1.5–2.5)

## 2019-06-21 ENCOUNTER — Ambulatory Visit (HOSPITAL_COMMUNITY): Admission: RE | Admit: 2019-06-21 | Payer: Self-pay | Source: Ambulatory Visit

## 2019-06-21 ENCOUNTER — Other Ambulatory Visit: Payer: Self-pay

## 2019-06-21 ENCOUNTER — Other Ambulatory Visit: Payer: Self-pay | Admitting: Radiology

## 2019-06-21 DIAGNOSIS — K7460 Unspecified cirrhosis of liver: Secondary | ICD-10-CM

## 2019-06-21 DIAGNOSIS — R188 Other ascites: Secondary | ICD-10-CM

## 2019-06-21 MED ORDER — SODIUM CHLORIDE 0.9 % IV SOLN: INTRAVENOUS | Status: DC

## 2019-06-22 ENCOUNTER — Other Ambulatory Visit: Payer: Self-pay

## 2019-06-22 ENCOUNTER — Other Ambulatory Visit: Payer: Self-pay | Admitting: Nurse Practitioner

## 2019-06-22 ENCOUNTER — Ambulatory Visit (HOSPITAL_COMMUNITY)
Admission: RE | Admit: 2019-06-22 | Discharge: 2019-06-22 | Disposition: A | Discharge status: Home / Self Care | Payer: Self-pay | Source: Ambulatory Visit | Attending: Nurse Practitioner | Admitting: Nurse Practitioner

## 2019-06-22 ENCOUNTER — Encounter (HOSPITAL_COMMUNITY): Payer: Self-pay

## 2019-06-22 DIAGNOSIS — K7460 Unspecified cirrhosis of liver: Secondary | ICD-10-CM

## 2019-06-22 DIAGNOSIS — R188 Other ascites: Secondary | ICD-10-CM | POA: Insufficient documentation

## 2019-06-22 DIAGNOSIS — K900 Celiac disease: Secondary | ICD-10-CM | POA: Insufficient documentation

## 2019-06-22 DIAGNOSIS — Z79899 Other long term (current) drug therapy: Secondary | ICD-10-CM | POA: Insufficient documentation

## 2019-06-22 DIAGNOSIS — I10 Essential (primary) hypertension: Secondary | ICD-10-CM | POA: Insufficient documentation

## 2019-06-22 HISTORY — PX: IR TRANSCATHETER BX: IMG713

## 2019-06-22 HISTORY — PX: IR VENOGRAM HEPATIC WO HEMODYNAMIC EVALUATION: IMG693

## 2019-06-22 HISTORY — PX: IR US GUIDE VASC ACCESS RIGHT: IMG2390

## 2019-06-22 HISTORY — PX: IR PARACENTESIS: IMG2679

## 2019-06-22 LAB — PROTIME-INR
INR: 1.1 (ref 0.8–1.2)
Prothrombin Time: 14.4 seconds (ref 11.4–15.2)

## 2019-06-22 LAB — CBC
HCT: 37.4 % — ABNORMAL LOW (ref 39.0–52.0)
Hemoglobin: 12.5 g/dL — ABNORMAL LOW (ref 13.0–17.0)
MCH: 32.4 pg (ref 26.0–34.0)
MCHC: 33.4 g/dL (ref 30.0–36.0)
MCV: 96.9 fL (ref 80.0–100.0)
Platelets: 250 10*3/uL (ref 150–400)
RBC: 3.86 MIL/uL — ABNORMAL LOW (ref 4.22–5.81)
RDW: 15.5 % (ref 11.5–15.5)
WBC: 4.6 10*3/uL (ref 4.0–10.5)
nRBC: 0 % (ref 0.0–0.2)

## 2019-06-22 MED ORDER — SODIUM CHLORIDE 0.9 % IV SOLN
INTRAVENOUS | Status: AC | PRN
  Administered 2019-06-22: 10 mL/h via INTRAVENOUS

## 2019-06-22 MED ORDER — FENTANYL CITRATE (PF) 100 MCG/2ML IJ SOLN
INTRAMUSCULAR | Status: AC | PRN
  Administered 2019-06-22: 25 ug via INTRAVENOUS
  Administered 2019-06-22: 50 ug via INTRAVENOUS
  Administered 2019-06-22: 25 ug via INTRAVENOUS

## 2019-06-22 MED ORDER — ALBUMIN HUMAN 25 % IV SOLN
INTRAVENOUS | Status: AC
  Filled 2019-06-22: qty 100

## 2019-06-22 MED ORDER — SODIUM CHLORIDE 0.9 % IV SOLN: INTRAVENOUS | Status: DC

## 2019-06-22 MED ORDER — ALBUMIN HUMAN 25 % IV SOLN
50.0000 g | Freq: Once | INTRAVENOUS | Status: AC
  Administered 2019-06-22: 50 g via INTRAVENOUS

## 2019-06-22 MED ORDER — FENTANYL CITRATE (PF) 100 MCG/2ML IJ SOLN
INTRAMUSCULAR | Status: AC
  Filled 2019-06-22: qty 2

## 2019-06-22 MED ORDER — HYDROCODONE-ACETAMINOPHEN 5-325 MG PO TABS: 1.0000 | ORAL_TABLET | ORAL | Status: DC | PRN

## 2019-06-22 MED ORDER — MIDAZOLAM HCL 2 MG/2ML IJ SOLN
INTRAMUSCULAR | Status: AC | PRN
  Administered 2019-06-22: 1 mg via INTRAVENOUS
  Administered 2019-06-22 (×2): 0.5 mg via INTRAVENOUS

## 2019-06-22 MED ORDER — LIDOCAINE HCL 1 % IJ SOLN
INTRAMUSCULAR | Status: AC
  Filled 2019-06-22: qty 20

## 2019-06-22 MED ORDER — MIDAZOLAM HCL 2 MG/2ML IJ SOLN
INTRAMUSCULAR | Status: AC
  Filled 2019-06-22: qty 2

## 2019-06-22 NOTE — Discharge Instructions (Signed)
Needle Biopsy, Care After This sheet gives you information about how to care for yourself after your procedure. Your health care provider may also give you more specific instructions. If you have problems or questions, contact your health care provider. What can I expect after the procedure? After the procedure, it is common to have soreness, bruising, or mild pain at the puncture site. This should go away in a few days. Follow these instructions at home: Needle insertion site care   Wash your hands with soap and water before you change your bandage (dressing). If you cannot use soap and water, use hand sanitizer.  Follow instructions from your health care provider about how to take care of your puncture site. This includes: ? When and how to change your dressing. ? When to remove your dressing.  Check your puncture site every day for signs of infection. Check for: ? Redness, swelling, or pain. ? Fluid or blood. ? Pus or a bad smell. ? Warmth. General instructions  Return to your normal activities as told by your health care provider. Ask your health care provider what activities are safe for you.  Do not take baths, swim, or use a hot tub until your health care provider approves. Ask your health care provider if you may take showers. You may only be allowed to take sponge baths.  Take over-the-counter and prescription medicines only as told by your health care provider.  Keep all follow-up visits as told by your health care provider. This is important. Contact a health care provider if:  You have a fever.  You have redness, swelling, or pain at the puncture site that lasts longer than a few days.  You have fluid, blood, or pus coming from your puncture site.  Your puncture site feels warm to the touch. Get help right away if:  You have severe bleeding from the puncture site. Summary  After the procedure, it is common to have soreness, bruising, or mild pain at the puncture  site. This should go away in a few days.  Check your puncture site every day for signs of infection, such as redness, swelling, or pain.  Get help right away if you have severe bleeding from your puncture site. This information is not intended to replace advice given to you by your health care provider. Make sure you discuss any questions you have with your health care provider. Document Revised: 05/29/2017 Document Reviewed: 03/30/2017 Elsevier Patient Education  2020 [employer].

## 2019-06-22 NOTE — H&P (Addendum)
Chief Complaint: Patient was seen in consultation today for cirrhosis  Referring Physician(s): Willia Craze  Supervising Physician: Arne Cleveland  Patient Status: [facility] - Out-pt  History of Present Illness: Shawn Martin is a 44 y.o. male with past medical history of HTN, celiac disease, and cirrhosis with recurrent ascites presents for transjugular liver biopsy and paracentesis.  Patient has undergone several recent paracentesis for large volume, refractory ascites.  He complains today of abdominal distention.  He is otherwise in his usual state of health.  He denies fever, chills, abdominal pain, nausea, vomiting.   He last ate at 650 today.  He does not take blood thinners.   Past Medical History:  Diagnosis Date  . Celiac disease   . Hepatic cirrhosis ([place])   . Hypertension     Past Surgical History:  Procedure Laterality Date  . IR PARACENTESIS  02/07/2019  . IR PARACENTESIS  05/25/2019  . IR PARACENTESIS  06/10/2019  . IR PARACENTESIS  06/15/2019  . NOSE SURGERY      Allergies: Gluten meal  Medications: Prior to Admission medications   Medication Sig Start Date End Date Taking? Authorizing Provider  furosemide (LASIX) 20 MG tablet Take 120 mg by mouth daily.    Yes [provider]  Multiple Vitamin (MULTIVITAMIN WITH MINERALS) TABS tablet Take 1 tablet by mouth daily.   Yes [provider]  propranolol (INDERAL) 10 MG tablet TAKE 1 TABLET BY MOUTH TWICE DAILY Patient taking differently: Take 10 mg by mouth 2 (two) times daily.  05/02/19  Yes Armbruster, [name], MD  spironolactone (ALDACTONE) 50 MG tablet Take 300 mg by mouth daily.    Yes [provider]     Family History  Problem Relation Age of Onset  . Healthy Mother   . Hypertension Father   . Heart attack Father   . Leukemia Brother   . Colon cancer Neg Hx   . Stomach cancer Neg Hx   . Pancreatic cancer Neg Hx     Social History   Socioeconomic History  .  Marital status: Married    Spouse name: Not on file  . Number of children: Not on file  . Years of education: Not on file  . Highest education level: Not on file  Occupational History  . Not on file  Tobacco Use  . Smoking status: Never Smoker  . Smokeless tobacco: Former Systems developer    Types: Chew  Substance and Sexual Activity  . Alcohol use: Not Currently    Comment: every day drinker, 6 pack per day  . Drug use: Never  . Sexual activity: Yes    Partners: Female  Other Topics Concern  . Not on file  Social History Narrative  . Not on file   Social Determinants of Health   Financial Resource Strain:   . Difficulty of Paying Living Expenses:   Food Insecurity:   . Worried About Charity fundraiser in the Last Year:   . Arboriculturist in the Last Year:   Transportation Needs:   . Film/video editor (Medical):   Marland Kitchen Lack of Transportation (Non-Medical):   Physical Activity:   . Days of Exercise per Week:   . Minutes of Exercise per Session:   Stress:   . Feeling of Stress :   Social Connections:   . Frequency of Communication with Friends and Family:   . Frequency of Social Gatherings with Friends and Family:   . Attends Religious Services:   .  Active Member of Clubs or Organizations:   . Attends Archivist Meetings:   Marland Kitchen Marital Status:     Review of Systems: A 12 point ROS discussed and pertinent positives are indicated in the HPI above.  All other systems are negative.  Review of Systems  Constitutional: Negative for fatigue and fever.  Respiratory: Negative for cough and shortness of breath.   Cardiovascular: Negative for chest pain.  Gastrointestinal: Negative for abdominal pain, diarrhea, nausea and vomiting.  Musculoskeletal: Negative for back pain.  Psychiatric/Behavioral: Negative for behavioral problems and confusion.    Vital Signs: BP (!) 116/91   Pulse 77   Temp 98.1 F (36.7 C)   Resp 16   Ht 6' (1.829 m)   Wt 231 lb 7.7 oz (105 kg)    SpO2 99%   BMI 31.39 kg/m   Physical Exam Vitals and nursing note reviewed.  Constitutional:      General: He is not in acute distress.    Appearance: He is not ill-appearing.  HENT:     Mouth/Throat:     Mouth: Mucous membranes are moist.     Pharynx: Oropharynx is clear.  Cardiovascular:     Rate and Rhythm: Normal rate and regular rhythm.     Pulses: Normal pulses.     Heart sounds: Normal heart sounds.  Pulmonary:     Effort: Pulmonary effort is normal. No respiratory distress.     Breath sounds: Normal breath sounds.  Abdominal:     General: There is distension.     Tenderness: There is no abdominal tenderness.  Skin:    General: Skin is warm and dry.  Neurological:     General: No focal deficit present.     Mental Status: He is alert and oriented to person, place, and time. Mental status is at baseline.  Psychiatric:        Mood and Affect: Mood normal.        Behavior: Behavior normal.        Thought Content: Thought content normal.        Judgment: Judgment normal.      MD Evaluation Airway: WNL Heart: WNL Abdomen: WNL Chest/ Lungs: WNL ASA  Classification: 3 Mallampati/Airway Score: One   Imaging: US Paracentesis  Result Date: 06/04/2019 INDICATION: Patient with history of cirrhosis, recurrent ascites. Request is made for diagnostic and therapeutic paracentesis. EXAM: ULTRASOUND GUIDED DIAGNOSTIC AND THERAPEUTIC PARACENTESIS MEDICATIONS: 10 mL 1% lidocaine COMPLICATIONS: None immediate. PROCEDURE: Informed written consent was obtained from the patient after a discussion of the risks, benefits and alternatives to treatment. A timeout was performed prior to the initiation of the procedure. Initial ultrasound scanning demonstrates a large amount of ascites within the left lateral abdomen. The left lateral abdomen was prepped and draped in the usual sterile fashion. 1% lidocaine was used for local anesthesia. Following this, a 19 gauge, 10-cm, Yueh catheter was  introduced. An ultrasound image was saved for documentation purposes. The paracentesis was performed. The catheter was removed and a dressing was applied. The patient tolerated the procedure well without immediate post procedural complication. FINDINGS: A total of approximately 5.0 liters of yellow fluid was removed. Samples were sent to the laboratory as requested by the clinical team. IMPRESSION: Successful ultrasound-guided paracentesis yielding 5.0 liters of peritoneal fluid. Read by: Brynda Greathouse PA-C Electronically Signed   By: Sandi Mariscal M.D.   On: 06/04/2019 15:07   IR Paracentesis  Result Date: 06/15/2019 INDICATION: Patient with history of cirrhosis,  recurrent ascites. Request to IR for therapeutic paracentesis with 5 L maximum. EXAM: ULTRASOUND GUIDED THERAPEUTIC PARACENTESIS MEDICATIONS: 10 mL 1% lidocaine COMPLICATIONS: None immediate. PROCEDURE: Informed written consent was obtained from the patient after a discussion of the risks, benefits and alternatives to treatment. A timeout was performed prior to the initiation of the procedure. Initial ultrasound scanning demonstrates a large amount of ascites within the right lower abdominal quadrant. The right lower abdomen was prepped and draped in the usual sterile fashion. 1% lidocaine was used for local anesthesia. Following this, a 19 gauge, 7-cm, Yueh catheter was introduced. An ultrasound image was saved for documentation purposes. The paracentesis was performed. The catheter was removed and a dressing was applied. The patient tolerated the procedure well without immediate post procedural complication. FINDINGS: A total of approximately 5.0 L of hazy pale yellow fluid was removed. IMPRESSION: Successful ultrasound-guided paracentesis yielding 5.0 liters of peritoneal fluid. Read by Candiss Norse, PA-C Electronically Signed   By: Markus Daft M.D.   On: 06/15/2019 14:56   IR Paracentesis  Result Date: 06/10/2019 INDICATION: Patient with  history of cirrhosis, recurrent ascites. Request is made for diagnostic and therapeutic paracentesis up to 5 L maximum. EXAM: ULTRASOUND GUIDED DIAGNOSTIC AND THERAPEUTIC PARACENTESIS MEDICATIONS: 10 mL 1% lidocaine COMPLICATIONS: None immediate. PROCEDURE: Informed written consent was obtained from the patient after a discussion of the risks, benefits and alternatives to treatment. A timeout was performed prior to the initiation of the procedure. Initial ultrasound scanning demonstrates a large amount of ascites within the left lateral abdomen. The left lateral abdomen was prepped and draped in the usual sterile fashion. 1% lidocaine was used for local anesthesia. Following this, a 19 gauge, 7-cm, Yueh catheter was introduced. An ultrasound image was saved for documentation purposes. The paracentesis was performed. The catheter was removed and a dressing was applied. The patient tolerated the procedure well without immediate post procedural complication. FINDINGS: A total of approximately 5.0 liters of yellow fluid was removed. Samples were sent to the laboratory as requested by the clinical team. IMPRESSION: Successful ultrasound-guided diagnostic and therapeutic paracentesis yielding 5.0 liters of peritoneal fluid. Read by: Brynda Greathouse PA-C Electronically Signed   By: Jerilynn Mages.  Shick M.D.   On: 06/10/2019 12:17   IR Paracentesis  Result Date: 05/25/2019 INDICATION: Patient with history of significant alcohol abuse, cirrhosis with recurrent ascites. Request to IR diagnostic and therapeutic paracentesis. EXAM: ULTRASOUND GUIDED DIAGNOSTIC AND THERAPEUTIC PARACENTESIS MEDICATIONS: 10 mL 1% lidocaine COMPLICATIONS: None immediate. PROCEDURE: Informed written consent was obtained from the patient after a discussion of the risks, benefits and alternatives to treatment. A timeout was performed prior to the initiation of the procedure. Initial ultrasound scanning demonstrates a large amount of ascites within the right  lower abdominal quadrant. The right lower abdomen was prepped and draped in the usual sterile fashion. 1% lidocaine was used for local anesthesia. Following this, a 19 gauge, 10 cm, Yueh catheter was introduced. An ultrasound image was saved for documentation purposes. The paracentesis was performed. The catheter was removed and a dressing was applied. The patient tolerated the procedure well without immediate post procedural complication. Patient received post-procedure intravenous albumin; see nursing notes for details. FINDINGS: A total of approximately 9.6 L of hazy, yellow fluid was removed. Samples were sent to the laboratory as requested by the clinical team. IMPRESSION: Successful ultrasound-guided paracentesis yielding 9.6 liters of peritoneal fluid. Read by Candiss Norse, PA-C Electronically Signed   By: Corrie Mckusick D.O.   On: 05/25/2019 14:09  Labs:  CBC: Recent Labs    05/24/19 1702 06/03/19 0438 06/03/19 1632 06/13/19 1410  WBC 4.0 5.6 5.8 3.6*  HGB 10.7* 10.6* 10.2* 11.6*  HCT 31.6* 30.7* 29.0* 34.4*  PLT 152.0 230 218 205.0    COAGS: Recent Labs    01/28/19 1558 05/24/19 1702 06/03/19 0438 06/13/19 1410  INR 1.2* 1.3* 1.3* 1.3*    BMP: Recent Labs    06/04/19 0827 06/04/19 0827 06/04/19 1357 06/04/19 1822 06/05/19 0207 06/05/19 0648 06/06/19 0142 06/09/19 1446 06/13/19 1410 06/20/19 1114  NA 119*   < > 121*   < > 126*   < > 128* 127* 127* 127*  K 3.3*   < > 3.8   < > 3.5   < > 3.5 3.6 3.9 4.7  CL 83*   < > 88*   < > 91*   < > 95* 93* 95* 96  CO2 26   < > 27   < > 29   < > 27 31 27 28   GLUCOSE 116*   < > 118*   < > 117*   < > 103* 114* 120* 128*  BUN 14   < > 13   < > 13   < > 15 14 15 17   CALCIUM 7.6*   < > 7.6*   < > 7.4*   < > 7.4* 8.0* 7.6* 8.0*  CREATININE 0.98   < > 0.73   < > 0.90   < > 0.88 0.79 0.93 1.11  GFRNONAA >60  --  >60  --  >60  --  >60  --   --   --   GFRAA >60  --  >60  --  >60  --  >60  --   --   --    < > = values in this  interval not displayed.    LIVER FUNCTION TESTS: Recent Labs    04/24/19 2301 05/24/19 1702 06/03/19 0438 06/13/19 1410  BILITOT 1.0 1.6* 4.0* 1.0  AST 204* 127* 142* 74*  ALT 57* 54* 64* 44  ALKPHOS 176* 124* 119 107  PROT 7.6 6.8 6.8 6.0  ALBUMIN 2.3* 2.3* 2.3* 2.2*    TUMOR MARKERS: Recent Labs    01/28/19 1558  AFPTM 4.3    Assessment and Plan: Patient with past medical history of HTN, celiac disease presents with complaint of cirrhosis, recurrent ascites.  IR consulted for transjugular liver biopsy and paracentesis at the request of Dr. Havery Moros. Case reviewed by Dr. Vernard Gambles who approves patient for procedure.  Patient presents today in their usual state of health.  He has been NPO at of 7AM and is not currently on blood thinners.   Risks and benefits were discussed with the patient and/or patient's family including, but not limited to bleeding, infection, damage to adjacent structures or low yield requiring additional tests.  All of the questions were answered and there is agreement to proceed.  Consent signed and in chart.  Thank you for this interesting consult.  I greatly enjoyed meeting Granville Fain Martin and look forward to participating in their care.  A copy of this report was sent to the requesting provider on this date.  Electronically Signed: Docia Barrier, PA 06/22/2019, 11:49 AM   I spent a total of  30 Minutes   in face to face in clinical consultation, greater than 50% of which was counseling/coordinating care for cirrhosis.

## 2019-06-22 NOTE — Procedures (Signed)
  Procedure: R hepatic venogram and transjugular core liver biopsy   EBL:   minimal Complications:  none immediate  See full dictation in [facility].  Dillard Cannon MD Main # [phone] Pager  [phone]

## 2019-06-22 NOTE — Sedation Documentation (Signed)
Patient is resting comfortably. 

## 2019-06-22 NOTE — Sedation Documentation (Signed)
Paracentesis in progress. Vital signs remain stable.

## 2019-06-23 ENCOUNTER — Other Ambulatory Visit: Payer: Self-pay | Admitting: Physician Assistant

## 2019-06-24 ENCOUNTER — Other Ambulatory Visit: Payer: Self-pay

## 2019-06-24 ENCOUNTER — Other Ambulatory Visit (INDEPENDENT_AMBULATORY_CARE_PROVIDER_SITE_OTHER): Payer: Self-pay

## 2019-06-24 ENCOUNTER — Ambulatory Visit (INDEPENDENT_AMBULATORY_CARE_PROVIDER_SITE_OTHER): Payer: Self-pay | Admitting: Gastroenterology

## 2019-06-24 ENCOUNTER — Telehealth: Payer: Self-pay | Admitting: Gastroenterology

## 2019-06-24 DIAGNOSIS — K7031 Alcoholic cirrhosis of liver with ascites: Secondary | ICD-10-CM

## 2019-06-24 DIAGNOSIS — Z23 Encounter for immunization: Secondary | ICD-10-CM

## 2019-06-24 LAB — BASIC METABOLIC PANEL
BUN: 15 mg/dL (ref 6–23)
CO2: 30 mEq/L (ref 19–32)
Calcium: 8.1 mg/dL — ABNORMAL LOW (ref 8.4–10.5)
Chloride: 97 mEq/L (ref 96–112)
Creatinine, Ser: 1.01 mg/dL (ref 0.40–1.50)
GFR: 79.56 mL/min (ref >60.00)
Glucose, Bld: 88 mg/dL (ref 70–99)
Potassium: 4.5 mEq/L (ref 3.5–5.1)
Sodium: 130 mEq/L — ABNORMAL LOW (ref 135–145)

## 2019-06-24 LAB — SURGICAL PATHOLOGY

## 2019-06-24 MED ORDER — FUROSEMIDE 40 MG PO TABS: 120.0000 mg | ORAL_TABLET | Freq: Every day | ORAL | 1 refills | Status: DC

## 2019-06-24 MED ORDER — SPIRONOLACTONE 100 MG PO TABS: 300.0000 mg | ORAL_TABLET | Freq: Every day | ORAL | 1 refills | Status: DC

## 2019-06-24 NOTE — Progress Notes (Unsigned)
OK to refill per Dr. Havery Moros.  Called and spoke to patient.  Let him know he should NOT be taking propranolol. He confirmed understanding and I removed propranolol from his med list.  Refills of lasix and aldactone sent to pharmacy.

## 2019-06-24 NOTE — Telephone Encounter (Signed)
OK to refill aldactone and lasix per Dr. Havery Moros but pt should not be taking propranolol.  Called and spoke to patient.  Let him know he should NOT be taking propranolol. He confirmed understanding and I removed propranolol from his med list. Refills of lasix and aldactone sent to pharmacy.

## 2019-06-24 NOTE — Telephone Encounter (Signed)
I called the patient to discuss results of his labs as well as his liver biopsy results.  I spoke with the patient and his wife for approximately 16 minutes.  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  Of note he also has had celiac disease diagnosed, these 2 disorders in addition to alcoholism have probably led to his diagnosis of cirrhosis at a young age.  The question at this time is if he warrants steroids for autoimmune hepatitis.  His biopsy shows only mildly active disease if he has it, and his AST is only 70, bili and ALT are normal.  I think the alcohol is likely driving his portal hypertension at this point most likely.  That being said he is really struggling with volume issues and given the complexity of his case I am going to refer him to hepatology, Dr. Precious Gilding office, for evaluation as soon as possible, to determine if he would benefit from prednisone.  I will hold off on prednisone at this time given this drug is not without risk and will significantly increase his risk for infection and SBP. I have spoken with a hepatology colleague briefly about this case who agreed with this.  Given his worsening volume status, he just had a 10 L paracentesis with albumin and his renal function is stable, has been on Aldactone 300 mg a day as well as Lasix 120 a day for the past week and states this is helping.  Given his renal function is stable I am going to increase his Aldactone to 400 mg a day and Lasix to 80 mg twice daily.  He will check his labs again in 1 week.  Finally I had recommended to him previously that he stop his propranolol in light of worsening fluid issues.  He had done this initially but apparently got confused and resumed it.   I asked him to completely stop this at this time it is making the fluid issues harder to treat.  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  I discussed with them that he has to abstain from alcohol, the sticks are quite high right now, I do not believe he is a candidate for transplant due to this issue but I will let the transplant hepatologist talk with him about that.  If he fails high-dose diuretics and continues to have massive ascites will need to consider TIPS.  He verbalized understanding and agreed with all of this.  Otherwise given his history of hyponatremia less than 130, he meets criteria for SBP prophylaxis with 1 double strength trimethoprim sulfa daily.      Barbera Setters can you help with the following on Monday: - referral to transplant hepatology, Dr. Precious Gilding office, ASAP consult for decompensated cirrhosis - possible autoimmune hepatitis - EGD to be done at the hospital.  Dr. Bryan Lemma has openings in the hospital in the next 2 weeks and offered to help out with some cases for me.  Can you please book this in one of his openings - can you order a BMET for next week (to be done Thursday / Friday) - order 1 double strength trimethoprim sulfa to be taken daily  for SBP prophylaxis - he will increase lasix to 2m BID and aldactone to 4061m/ day, he should have enough of those. thanks  Thanks

## 2019-06-27 ENCOUNTER — Other Ambulatory Visit: Payer: Self-pay | Admitting: Gastroenterology

## 2019-06-27 DIAGNOSIS — I8500 Esophageal varices without bleeding: Secondary | ICD-10-CM

## 2019-06-27 DIAGNOSIS — K7031 Alcoholic cirrhosis of liver with ascites: Secondary | ICD-10-CM

## 2019-06-27 MED ORDER — FUROSEMIDE 40 MG PO TABS: 80.0000 mg | ORAL_TABLET | Freq: Two times a day (BID) | ORAL | 1 refills | Status: DC

## 2019-06-27 MED ORDER — SPIRONOLACTONE 100 MG PO TABS: 400.0000 mg | ORAL_TABLET | Freq: Every day | ORAL | 1 refills | Status: DC

## 2019-06-27 MED ORDER — SULFAMETHOXAZOLE-TRIMETHOPRIM 800-160 MG PO TABS: 1.0000 | ORAL_TABLET | Freq: Every day | ORAL | 3 refills | Status: DC

## 2019-06-27 NOTE — Addendum Note (Signed)
Addended by: Marlon Pel on: 06/27/2019 02:12 PM   Modules accepted: Orders

## 2019-06-27 NOTE — Telephone Encounter (Signed)
I called and spoke with the patient.  He verbalized understanding of all the instructions.  He will come for labs on Thursday as the office is closed on Friday for the holiday.  New Rx sent to the pharmacy.  He is aware he will be contacted directly by Dr. Precious Gilding office with an appt date and time.  EGD was previously scheduled and his is aware he is scheduled for 07/11/19 with Dr. Bryan Lemma.

## 2019-06-28 ENCOUNTER — Other Ambulatory Visit: Payer: Self-pay | Admitting: Gastroenterology

## 2019-06-28 ENCOUNTER — Telehealth: Payer: Self-pay

## 2019-06-28 NOTE — Telephone Encounter (Signed)
Patient sent the below MyChart message. I discussed with Dr. Fuller Plan, DOD for [facility] that feels that patient needs to go to the ED for eval and urgent tx with possible paracentesis.  Patient has been notified and he agrees to proceed to the ED for eval.        [place], Navid Fain sent to [facility] Clinical Pool  Phone Number: [phone]  My weight is up by 30lbs since Saturday. I CANT BREATH! If y'all can't help me I will have no choice but to go the hospital. I can't even set up in the bed without having problems breathing. If you check my labs on Thursday then it will be Friday before I hear anything and then the weekend will be here and I won't be able to get drained over the weekend. Dr. Havery Moros told me that my kidneys looked Good Friday. I would like to be able to feel good and spend Easter with my family. I'm not sleeping. Benadryl has the reverse affect. Something has to give. I don't see the liver specialist until next week and I don't feel I'll survive that long with this fluid this bad.       Previous Messages

## 2019-06-29 ENCOUNTER — Other Ambulatory Visit: Payer: Self-pay

## 2019-06-29 ENCOUNTER — Telehealth: Payer: Self-pay

## 2019-06-29 DIAGNOSIS — E8770 Fluid overload, unspecified: Secondary | ICD-10-CM

## 2019-06-29 NOTE — Telephone Encounter (Signed)
I spoke with patient after receiving a response back from [place] regarding a paracentesis.  Patient was advised that he needs to have labs drawn tomorrow before scheduling another paracentesis.  He is aware that if he becomes more uncomfortable over the weekend he needs to go to the emergency room. Patient verbalized understanding.

## 2019-06-29 NOTE — Telephone Encounter (Signed)
-----   Message from Willia Craze, NP sent at 06/29/2019  2:40 PM EDT ----- Regarding: RE: Paracentesis [place], Dr Havery Moros want a BMET before ordering another paracentesis. Please ask patient to come tomorrow or Friday for bmet, thanks ----- Message ----- From: Lowell Guitar, RMA Sent: 06/29/2019  11:34 AM EDT To: Willia Craze, NP Subject: Paracentesis                                   Paula,I just spoke with the patient regarding an echocardiogram appointment for volume overload scheduled for tomorrow.  The patient states he is in pain and wants another paracentesis scheduled.  Please advise.  Thanks [employer]

## 2019-06-29 NOTE — Telephone Encounter (Signed)
Rec'd fax confirmation  From [place] that pt has appt on 07-05-19 at 10:00am

## 2019-06-29 NOTE — Telephone Encounter (Signed)
Patient is scheduled to see Dr. Zollie Scale on 07/05/19.

## 2019-06-30 ENCOUNTER — Encounter (HOSPITAL_COMMUNITY): Payer: Self-pay

## 2019-06-30 ENCOUNTER — Other Ambulatory Visit: Payer: Self-pay

## 2019-06-30 ENCOUNTER — Ambulatory Visit (HOSPITAL_COMMUNITY)
Admission: RE | Admit: 2019-06-30 | Discharge: 2019-06-30 | Disposition: A | Discharge status: Home / Self Care | Payer: Self-pay | Source: Ambulatory Visit | Attending: Nurse Practitioner | Admitting: Nurse Practitioner

## 2019-06-30 ENCOUNTER — Emergency Department (HOSPITAL_COMMUNITY): Payer: Self-pay

## 2019-06-30 ENCOUNTER — Inpatient Hospital Stay (HOSPITAL_COMMUNITY)
Admission: EM | Admit: 2019-06-30 | Discharge: 2019-07-04 | DRG: 432 | Disposition: A | Discharge status: Home / Self Care | Payer: Self-pay | Attending: Internal Medicine | Admitting: Internal Medicine

## 2019-06-30 DIAGNOSIS — Z95828 Presence of other vascular implants and grafts: Secondary | ICD-10-CM

## 2019-06-30 DIAGNOSIS — D649 Anemia, unspecified: Secondary | ICD-10-CM | POA: Diagnosis present

## 2019-06-30 DIAGNOSIS — Z806 Family history of leukemia: Secondary | ICD-10-CM

## 2019-06-30 DIAGNOSIS — T500X5A Adverse effect of mineralocorticoids and their antagonists, initial encounter: Secondary | ICD-10-CM | POA: Diagnosis present

## 2019-06-30 DIAGNOSIS — K7031 Alcoholic cirrhosis of liver with ascites: Principal | ICD-10-CM | POA: Diagnosis present

## 2019-06-30 DIAGNOSIS — Z8249 Family history of ischemic heart disease and other diseases of the circulatory system: Secondary | ICD-10-CM

## 2019-06-30 DIAGNOSIS — E8770 Fluid overload, unspecified: Secondary | ICD-10-CM

## 2019-06-30 DIAGNOSIS — I8510 Secondary esophageal varices without bleeding: Secondary | ICD-10-CM | POA: Diagnosis present

## 2019-06-30 DIAGNOSIS — E871 Hypo-osmolality and hyponatremia: Secondary | ICD-10-CM

## 2019-06-30 DIAGNOSIS — R188 Other ascites: Secondary | ICD-10-CM

## 2019-06-30 DIAGNOSIS — K766 Portal hypertension: Secondary | ICD-10-CM | POA: Diagnosis present

## 2019-06-30 DIAGNOSIS — M810 Age-related osteoporosis without current pathological fracture: Secondary | ICD-10-CM | POA: Diagnosis present

## 2019-06-30 DIAGNOSIS — Z91018 Allergy to other foods: Secondary | ICD-10-CM

## 2019-06-30 DIAGNOSIS — E875 Hyperkalemia: Secondary | ICD-10-CM | POA: Diagnosis present

## 2019-06-30 DIAGNOSIS — K3189 Other diseases of stomach and duodenum: Secondary | ICD-10-CM | POA: Diagnosis present

## 2019-06-30 DIAGNOSIS — I959 Hypotension, unspecified: Secondary | ICD-10-CM | POA: Diagnosis present

## 2019-06-30 DIAGNOSIS — Z87891 Personal history of nicotine dependence: Secondary | ICD-10-CM

## 2019-06-30 DIAGNOSIS — E222 Syndrome of inappropriate secretion of antidiuretic hormone: Secondary | ICD-10-CM | POA: Diagnosis present

## 2019-06-30 DIAGNOSIS — K900 Celiac disease: Secondary | ICD-10-CM | POA: Diagnosis present

## 2019-06-30 DIAGNOSIS — I10 Essential (primary) hypertension: Secondary | ICD-10-CM | POA: Diagnosis present

## 2019-06-30 DIAGNOSIS — N179 Acute kidney failure, unspecified: Secondary | ICD-10-CM | POA: Diagnosis present

## 2019-06-30 DIAGNOSIS — F1020 Alcohol dependence, uncomplicated: Secondary | ICD-10-CM | POA: Diagnosis present

## 2019-06-30 DIAGNOSIS — B1920 Unspecified viral hepatitis C without hepatic coma: Secondary | ICD-10-CM | POA: Diagnosis present

## 2019-06-30 DIAGNOSIS — Z23 Encounter for immunization: Secondary | ICD-10-CM

## 2019-06-30 DIAGNOSIS — U071 COVID-19: Secondary | ICD-10-CM | POA: Diagnosis present

## 2019-06-30 HISTORY — DX: Presence of other vascular implants and grafts: Z95.828

## 2019-06-30 LAB — COMPREHENSIVE METABOLIC PANEL
ALT: 43 U/L (ref 0–44)
AST: 82 U/L — ABNORMAL HIGH (ref 15–41)
Albumin: 2.1 g/dL — ABNORMAL LOW (ref 3.5–5.0)
Alkaline Phosphatase: 99 U/L (ref 38–126)
Anion gap: 13 (ref 5–15)
BUN: 20 mg/dL (ref 6–20)
CO2: 20 mmol/L — ABNORMAL LOW (ref 22–32)
Calcium: 8.4 mg/dL — ABNORMAL LOW (ref 8.9–10.3)
Chloride: 88 mmol/L — ABNORMAL LOW (ref 98–111)
Creatinine, Ser: 1.52 mg/dL — ABNORMAL HIGH (ref 0.61–1.24)
GFR calc Af Amer: >60 mL/min (ref >60)
GFR calc non Af Amer: 55 mL/min — ABNORMAL LOW (ref >60)
Glucose, Bld: 103 mg/dL — ABNORMAL HIGH (ref 70–99)
Potassium: 5.2 mmol/L — ABNORMAL HIGH (ref 3.5–5.1)
Sodium: 121 mmol/L — ABNORMAL LOW (ref 135–145)
Total Bilirubin: 2.5 mg/dL — ABNORMAL HIGH (ref 0.3–1.2)
Total Protein: 6.6 g/dL (ref 6.5–8.1)

## 2019-06-30 LAB — CBC WITH DIFFERENTIAL/PLATELET
Abs Immature Granulocytes: 0.04 10*3/uL (ref 0.00–0.07)
Basophils Absolute: 0 10*3/uL (ref 0.0–0.1)
Basophils Relative: 1 %
Eosinophils Absolute: 0.2 10*3/uL (ref 0.0–0.5)
Eosinophils Relative: 3 %
HCT: 34.6 % — ABNORMAL LOW (ref 39.0–52.0)
Hemoglobin: 12 g/dL — ABNORMAL LOW (ref 13.0–17.0)
Immature Granulocytes: 1 %
Lymphocytes Relative: 21 %
Lymphs Abs: 1.1 10*3/uL (ref 0.7–4.0)
MCH: 31.5 pg (ref 26.0–34.0)
MCHC: 34.7 g/dL (ref 30.0–36.0)
MCV: 90.8 fL (ref 80.0–100.0)
Monocytes Absolute: 0.8 10*3/uL (ref 0.1–1.0)
Monocytes Relative: 14 %
Neutro Abs: 3.2 10*3/uL (ref 1.7–7.7)
Neutrophils Relative %: 60 %
Platelets: 189 10*3/uL (ref 150–400)
RBC: 3.81 MIL/uL — ABNORMAL LOW (ref 4.22–5.81)
RDW: 15.5 % (ref 11.5–15.5)
WBC: 5.3 10*3/uL (ref 4.0–10.5)
nRBC: 0 % (ref 0.0–0.2)

## 2019-06-30 LAB — RESPIRATORY PANEL BY RT PCR (FLU A&B, COVID)
Influenza A by PCR: NEGATIVE
Influenza B by PCR: NEGATIVE
SARS Coronavirus 2 by RT PCR: POSITIVE — AB

## 2019-06-30 LAB — LIPASE, BLOOD: Lipase: 43 U/L (ref 11–51)

## 2019-06-30 LAB — APTT: aPTT: 34 seconds (ref 24–36)

## 2019-06-30 LAB — URINALYSIS, ROUTINE W REFLEX MICROSCOPIC
Bilirubin Urine: NEGATIVE
Glucose, UA: NEGATIVE mg/dL
Hgb urine dipstick: NEGATIVE
Ketones, ur: NEGATIVE mg/dL
Leukocytes,Ua: NEGATIVE
Nitrite: NEGATIVE
Protein, ur: NEGATIVE mg/dL
Specific Gravity, Urine: 1.009 (ref 1.005–1.030)
pH: 6 (ref 5.0–8.0)

## 2019-06-30 LAB — MAGNESIUM: Magnesium: 2 mg/dL (ref 1.7–2.4)

## 2019-06-30 LAB — PROTIME-INR
INR: 1.2 (ref 0.8–1.2)
Prothrombin Time: 15.2 seconds (ref 11.4–15.2)

## 2019-06-30 LAB — OSMOLALITY: Osmolality: 256 mOsm/kg — ABNORMAL LOW (ref 275–295)

## 2019-06-30 LAB — SODIUM, URINE, RANDOM: Sodium, Ur: 75 mmol/L

## 2019-06-30 LAB — OSMOLALITY, URINE: Osmolality, Ur: 368 mOsm/kg (ref 300–900)

## 2019-06-30 LAB — TSH: TSH: 3.126 u[IU]/mL (ref 0.350–4.500)

## 2019-06-30 MED ORDER — ACETAMINOPHEN 325 MG PO TABS: 650.0000 mg | ORAL_TABLET | Freq: Four times a day (QID) | ORAL | Status: DC | PRN

## 2019-06-30 MED ORDER — FUROSEMIDE 20 MG PO TABS: 80.0000 mg | ORAL_TABLET | Freq: Two times a day (BID) | ORAL | Status: DC

## 2019-06-30 MED ORDER — ALBUMIN HUMAN 25 % IV SOLN
12.5000 g | Freq: Once | INTRAVENOUS | Status: AC
  Administered 2019-06-30: 12.5 g via INTRAVENOUS
  Filled 2019-06-30: qty 50

## 2019-06-30 MED ORDER — SPIRONOLACTONE 100 MG PO TABS: 400.0000 mg | ORAL_TABLET | Freq: Every day | ORAL | Status: DC

## 2019-06-30 MED ORDER — ONDANSETRON HCL 4 MG PO TABS: 4.0000 mg | ORAL_TABLET | Freq: Four times a day (QID) | ORAL | Status: DC | PRN

## 2019-06-30 MED ORDER — SULFAMETHOXAZOLE-TRIMETHOPRIM 800-160 MG PO TABS
1.0000 | ORAL_TABLET | Freq: Every day | ORAL | Status: DC
  Administered 2019-06-30: 1 via ORAL
  Filled 2019-06-30: qty 1

## 2019-06-30 MED ORDER — ALBUMIN HUMAN 25 % IV SOLN
12.5000 g | Freq: Four times a day (QID) | INTRAVENOUS | Status: AC
  Administered 2019-06-30 – 2019-07-01 (×3): 12.5 g via INTRAVENOUS
  Filled 2019-06-30 (×3): qty 50

## 2019-06-30 MED ORDER — MAGNESIUM GLUCONATE 500 MG PO TABS
500.0000 mg | ORAL_TABLET | Freq: Every day | ORAL | Status: DC | PRN
  Filled 2019-06-30: qty 1

## 2019-06-30 MED ORDER — ADULT MULTIVITAMIN W/MINERALS CH
1.0000 | ORAL_TABLET | Freq: Every day | ORAL | Status: DC
  Administered 2019-06-30 – 2019-07-04 (×5): 1 via ORAL
  Filled 2019-06-30 (×5): qty 1

## 2019-06-30 MED ORDER — ACETAMINOPHEN 650 MG RE SUPP: 650.0000 mg | Freq: Four times a day (QID) | RECTAL | Status: DC | PRN

## 2019-06-30 MED ORDER — ONDANSETRON HCL 4 MG/2ML IJ SOLN: 4.0000 mg | Freq: Four times a day (QID) | INTRAMUSCULAR | Status: DC | PRN

## 2019-06-30 MED ORDER — SODIUM ZIRCONIUM CYCLOSILICATE 5 G PO PACK
5.0000 g | PACK | Freq: Once | ORAL | Status: AC
  Administered 2019-06-30: 5 g via ORAL
  Filled 2019-06-30: qty 1

## 2019-06-30 NOTE — ED Provider Notes (Signed)
[facility] EMERGENCY DEPARTMENT Provider Note   CSN: [id number] Arrival date & time: 06/30/19  1114     History Chief Complaint  Patient presents with  . Shortness of Breath  . Abdominal Pain    abdominal distension    Shawn Martin is a 44 y.o. male with a past medical history of hypertension, cirrhosis, celiac's disease and hep C presenting to the ED for shortness of breath and abdominal distention for the past 5 days.  Patient underwent paracentesis on 06/22/2019, which he usually undergoes weekly.  States that he felt significantly improved up until 2 days after the paracentesis was done.  Reports gradually worsening abdominal distention and shortness of breath secondary to this increased fluid.  Denies any vomiting, diarrhea, urinary symptoms, fever, chest pain, leg swelling. He has required paracentesis with removal of 10L in the past.  He underwent a liver biopsy last week, was told that he had "stage IV cirrhosis" and was told to follow-up with the liver specialist next week. His GI specialist did order an echo which was supposed to be done today, but due to patient's symptoms he was sent to the ER. He has been compliant with his home medications.  Denies any alcohol use currently.  HPI     Past Medical History:  Diagnosis Date  . Celiac disease   . Hepatic cirrhosis ([place])   . Hypertension     Patient Active Problem List   Diagnosis Date Noted  . Hyperkalemia 06/30/2019  . AKI (acute kidney injury) ([place]) 06/30/2019  . Hyponatremia 06/03/2019  . Alcohol dependence ([place]) 06/03/2019  . Symptomatic anemia 06/03/2019  . Ascites due to alcoholic cirrhosis ([place]) 16/09/3708  . Alcoholic cirrhosis of liver with ascites ([place]) 01/28/2019  . Screening for viral disease 01/28/2019    Past Surgical History:  Procedure Laterality Date  . IR PARACENTESIS  02/07/2019  . IR PARACENTESIS  05/25/2019  . IR PARACENTESIS  06/10/2019  . IR PARACENTESIS   06/15/2019  . IR PARACENTESIS  06/22/2019  . IR TRANSCATHETER BX  06/22/2019  . IR US GUIDE VASC ACCESS RIGHT  06/22/2019  . IR VENOGRAM HEPATIC WO HEMODYNAMIC EVALUATION  06/22/2019  . NOSE SURGERY         Family History  Problem Relation Age of Onset  . Healthy Mother   . Hypertension Father   . Heart attack Father   . Leukemia Brother   . Colon cancer Neg Hx   . Stomach cancer Neg Hx   . Pancreatic cancer Neg Hx     Social History   Tobacco Use  . Smoking status: Never Smoker  . Smokeless tobacco: Former Systems developer    Types: Chew  Substance Use Topics  . Alcohol use: Not Currently    Comment: every day drinker, 6 pack per day  . Drug use: Never    Home Medications Prior to Admission medications   Medication Sig Start Date End Date Taking? Authorizing Provider  diphenhydramine-acetaminophen (TYLENOL PM) 25-500 MG TABS tablet Take 1 tablet by mouth at bedtime as needed (sleep).   Yes [provider]  furosemide (LASIX) 40 MG tablet Take 2 tablets (80 mg total) by mouth 2 (two) times daily. 06/27/19  Yes Armbruster, [name], MD  magnesium gluconate (MAGONATE) 500 MG tablet Take 500 mg by mouth daily as needed (cramps).   Yes [provider]  Multiple Vitamin (MULTIVITAMIN WITH MINERALS) TABS tablet Take 1 tablet by mouth daily.   Yes [provider]  spironolactone (ALDACTONE) 100 MG tablet Take 4 tablets (400 mg total) by mouth daily. 06/27/19  Yes Armbruster, [name], MD  sulfamethoxazole-trimethoprim (BACTRIM DS) 800-160 MG tablet Take 1 tablet by mouth daily. 06/27/19  Yes Armbruster, [name], MD    Allergies    Gluten meal  Review of Systems   Review of Systems  Constitutional: Negative for appetite change, chills and fever.  HENT: Negative for ear pain, rhinorrhea, sneezing and sore throat.   Eyes: Negative for photophobia and visual disturbance.  Respiratory: Positive for shortness of breath. Negative for cough, chest tightness and wheezing.    Cardiovascular: Negative for chest pain and palpitations.  Gastrointestinal: Positive for abdominal distention. Negative for abdominal pain, blood in stool, constipation, diarrhea, nausea and vomiting.  Genitourinary: Negative for dysuria, hematuria and urgency.  Musculoskeletal: Negative for myalgias.  Skin: Negative for rash.  Neurological: Negative for dizziness, weakness and light-headedness.    Physical Exam Updated Vital Signs BP 130/78   Pulse (!) 104   Temp 97.8 F (36.6 C)   Resp (!) 21   SpO2 98%   Physical Exam Vitals and nursing note reviewed.  Constitutional:      General: He is not in acute distress.    Appearance: He is well-developed. He is ill-appearing.     Comments: Oxygen being delivered via nasal cannula.  HENT:     Head: Normocephalic and atraumatic.     Nose: Nose normal.  Eyes:     General: No scleral icterus.       Left eye: No discharge.     Conjunctiva/sclera: Conjunctivae normal.  Cardiovascular:     Rate and Rhythm: Normal rate and regular rhythm.     Heart sounds: Normal heart sounds. No murmur. No friction rub. No gallop.   Pulmonary:     Effort: Pulmonary effort is normal. No respiratory distress.     Breath sounds: Normal breath sounds.  Abdominal:     General: Abdomen is protuberant. Bowel sounds are normal. There is distension.     Palpations: Abdomen is soft.     Tenderness: There is no abdominal tenderness. There is no guarding.  Musculoskeletal:        General: Normal range of motion.     Cervical back: Normal range of motion and neck supple.  Skin:    General: Skin is warm and dry.     Findings: No rash.  Neurological:     Mental Status: He is alert.     Motor: No abnormal muscle tone.     Coordination: Coordination normal.     ED Results / Procedures / Treatments   Labs (all labs ordered are listed, but only abnormal results are displayed) Labs Reviewed  COMPREHENSIVE METABOLIC PANEL - Abnormal; Notable for the  following components:      Result Value   Sodium 121 (*)    Potassium 5.2 (*)    Chloride 88 (*)    CO2 20 (*)    Glucose, Bld 103 (*)    Creatinine, Ser 1.52 (*)    Calcium 8.4 (*)    Albumin 2.1 (*)    AST 82 (*)    Total Bilirubin 2.5 (*)    GFR calc non Af Amer 55 (*)    All other components within normal limits  CBC WITH DIFFERENTIAL/PLATELET - Abnormal; Notable for the following components:   RBC 3.81 (*)    Hemoglobin 12.0 (*)    HCT 34.6 (*)    All other components  within normal limits  RESPIRATORY PANEL BY RT PCR (FLU A&B, COVID)  LIPASE, BLOOD  URINALYSIS, ROUTINE W REFLEX MICROSCOPIC  APTT  PROTIME-INR  OSMOLALITY  OSMOLALITY, URINE  SODIUM, URINE, RANDOM  TSH    EKG None  Radiology DG Chest Portable 1 View  Result Date: 06/30/2019 CLINICAL DATA:  Shortness of breath EXAM: PORTABLE CHEST 1 VIEW COMPARISON:  None. FINDINGS: Note shallow degree of inspiration. There is a rather minimal pleural effusion on each side. There is slight bibasilar atelectasis. The lungs elsewhere are clear. Heart size and pulmonary vascularity are normal. No adenopathy. No bone lesions. IMPRESSION: Rather minimal pleural effusions bilaterally with bibasilar atelectasis. Lungs elsewhere clear. Cardiac silhouette normal. Electronically Signed   By: Lowella Grip III M.D.   On: 06/30/2019 13:23    Procedures Procedures (including critical care time)  Medications Ordered in ED Medications  sodium zirconium cyclosilicate (LOKELMA) packet 5 g (has no administration in time range)  acetaminophen (TYLENOL) tablet 650 mg (has no administration in time range)    Or  acetaminophen (TYLENOL) suppository 650 mg (has no administration in time range)  ondansetron (ZOFRAN) tablet 4 mg (has no administration in time range)    Or  ondansetron (ZOFRAN) injection 4 mg (has no administration in time range)  albumin human 25 % solution 12.5 g (has no administration in time range)    ED Course   I have reviewed the triage vital signs and the nursing notes.  Pertinent labs & imaging results that were available during my care of the patient were reviewed by me and considered in my medical decision making (see chart for details).    MDM Rules/Calculators/A&P                      44 year old male with a past medical history of hypertension, cirrhosis presenting to the ED for shortness of breath and abdominal distention for the past 5 days.  Patient underwent paracentesis on 06/22/2019 which he usually undergoes weekly.  He felt improved for 2 days after the paracentesis but reports gradually worsening abdominal distention and shortness of breath secondary to increased fluid since then.  He has had paracentesis requiring removal of 10 L in the past.  Denies any chest pain, leg swelling, fever, vomiting.  On exam there is severe abdominal distention.  He is alert and oriented.  He is slightly tachycardic as well.  Abdomen is not tender.  Lab work significant for sodium of 121, potassium of 5.2 and creatinine of 1.5.  EKG shows sinus rhythm, prolonged QT.  Chest x-ray shows minimal pleural effusions.  Due to patient's hyponatremia, slightly lower than baseline and his need for paracentesis, patient will need to be admitted to hospitalist service.  Final Clinical Impression(s) / ED Diagnoses Final diagnoses:  Ascites due to alcoholic cirrhosis ([place])  Hyponatremia    Rx / DC Orders ED Discharge Orders    None       Portions of this note were generated with Dragon dictation software. Dictation errors may occur despite best attempts at proofreading.    Delia Heady, PA-C 06/30/19 1436    Maudie Flakes, MD 07/04/19 [phone]

## 2019-06-30 NOTE — H&P (Addendum)
History and Physical    Shawn Martin AST:[id number] DOB: 17-Jan-1976 DOA: 06/30/2019  PCP: Patient, No Pcp Per  Patient coming from: Home I have personally briefly reviewed patient's old medical records in [place]  Chief Complaint: Abdominal distention and shortness of breath  HPI: Shawn Martin is a 44 y.o. male with medical history significant of alcoholic liver cirrhosis, celiac disease, hepatitis C, hypertension, esophageal varices, portal hypertensive gastropathy presents to emergency department with increasing abdominal bloating and shortness of breath.  Patient reports that he gets weekly paracentesis for last 2 months.  His last paracentesis was on 06/22/2019 when 10 L were removed.  He was doing fine after paracentesis for about 2 days.  He reports gradually worsening abdominal distention and shortness of breath, denies association with abdominal pain, fever, chills, headache, blurry vision, chest pain, palpitation, nausea, vomiting, diarrhea, constipation.    He sees Dr. Havery Moros at [facility]. he was scheduled to get transthoracic echo this morning however due to shortness of breath echo was not done and patient was advised to go to ER for further evaluation and management.Marland Kitchen  His wife at bedside reports that patient is scheduled to see liver specialist on coming Tuesday.  Patient lives with his wife at home.  He denies current use of alcohol, illicit drug use, smoking.  He is compliant with his Lasix 80 mg twice a day and Aldactone 400 mg once a day at home.  ED Course: Upon arrival to ED patient slightly tachycardic and tachypneic, afebrile with no leukocytosis.  Sodium 121, potassium 5.2, chloride 88, lipase: WNL.  Chest x-ray shows minimal pleural effusion bilaterally with bibasilar atelectasis.  Lungs elsewhere clear.  Cardiac silhouette normal.  [facility] hospitalist consulted for admission for abdominal distention and hyponatremia.   Review of Systems: As per  HPI otherwise negative.    Past Medical History:  Diagnosis Date  . Celiac disease   . Hepatic cirrhosis ([place])   . Hypertension     Past Surgical History:  Procedure Laterality Date  . IR PARACENTESIS  02/07/2019  . IR PARACENTESIS  05/25/2019  . IR PARACENTESIS  06/10/2019  . IR PARACENTESIS  06/15/2019  . IR PARACENTESIS  06/22/2019  . IR TRANSCATHETER BX  06/22/2019  . IR US GUIDE VASC ACCESS RIGHT  06/22/2019  . IR VENOGRAM HEPATIC WO HEMODYNAMIC EVALUATION  06/22/2019  . NOSE SURGERY       reports that he has never smoked. He has quit using smokeless tobacco.  His smokeless tobacco use included chew. He reports previous alcohol use. He reports that he does not use drugs.  Allergies  Allergen Reactions  . Gluten Meal Diarrhea    bloating    Family History  Problem Relation Age of Onset  . Healthy Mother   . Hypertension Father   . Heart attack Father   . Leukemia Brother   . Colon cancer Neg Hx   . Stomach cancer Neg Hx   . Pancreatic cancer Neg Hx     Prior to Admission medications   Medication Sig Start Date End Date Taking? Authorizing Provider  diphenhydramine-acetaminophen (TYLENOL PM) 25-500 MG TABS tablet Take 1 tablet by mouth at bedtime as needed (sleep).   Yes [provider]  furosemide (LASIX) 40 MG tablet Take 2 tablets (80 mg total) by mouth 2 (two) times daily. 06/27/19  Yes Armbruster, [name], MD  magnesium gluconate (MAGONATE) 500 MG tablet Take 500 mg by mouth daily as needed (cramps).  Yes [provider]  Multiple Vitamin (MULTIVITAMIN WITH MINERALS) TABS tablet Take 1 tablet by mouth daily.   Yes [provider]  spironolactone (ALDACTONE) 100 MG tablet Take 4 tablets (400 mg total) by mouth daily. 06/27/19  Yes Armbruster, [name], MD  sulfamethoxazole-trimethoprim (BACTRIM DS) 800-160 MG tablet Take 1 tablet by mouth daily. 06/27/19  Yes Armbruster, [name], MD    Physical Exam: Vitals:   06/30/19 1122 06/30/19 1200  06/30/19 1300 06/30/19 1315  BP:    130/78  Pulse: (!) 103  (!) 104   Resp: (!) 24  (!) 25 (!) 21  Temp:  97.8 F (36.6 C)    SpO2: 95%  98%     Constitutional: NAD, calm, comfortable, communicating well Eyes: PERRL, lids and conjunctivae normal ENMT: Mucous membranes are moist. Posterior pharynx clear of any exudate or lesions.Normal dentition.  Neck: normal, supple, no masses, no thyromegaly Respiratory: clear to auscultation bilaterally, no wheezing, no crackles. Normal respiratory effort. No accessory muscle use.  Cardiovascular: Regular rate and rhythm, no murmurs / rubs / gallops.  Bilateral 2+ pitting edema positive. 2+ pedal pulses. No carotid bruits.  Abdomen: Abdominal distention noted.  No tenderness.   Musculoskeletal: no clubbing / cyanosis. No joint deformity upper and lower extremities. Good ROM, no contractures. Normal muscle tone.  Skin: no rashes, lesions, ulcers. No induration Neurologic: CN 2-12 grossly intact. Sensation intact, DTR normal. Strength 5/5 in all 4.  Psychiatric: Normal judgment and insight. Alert and oriented x 3. Normal mood.    Labs on Admission: I have personally reviewed following labs and imaging studies  CBC: Recent Labs  Lab 06/30/19 1151  WBC 5.3  NEUTROABS 3.2  HGB 12.0*  HCT 34.6*  MCV 90.8  PLT 453   Basic Metabolic Panel: Recent Labs  Lab 06/24/19 1433 06/30/19 1151  NA 130* 121*  K 4.5 5.2*  CL 97 88*  CO2 30 20*  GLUCOSE 88 103*  BUN 15 20  CREATININE 1.01 1.52*  CALCIUM 8.1* 8.4*   GFR: Estimated Creatinine Clearance: 76.3 mL/min (A) (by C-G formula based on SCr of 1.52 mg/dL (H)). Liver Function Tests: Recent Labs  Lab 06/30/19 1151  AST 82*  ALT 43  ALKPHOS 99  BILITOT 2.5*  PROT 6.6  ALBUMIN 2.1*   Recent Labs  Lab 06/30/19 1151  LIPASE 43   No results for input(s): AMMONIA in the last 168 hours. Coagulation Profile: No results for input(s): INR, PROTIME in the last 168 hours. Cardiac  Enzymes: No results for input(s): CKTOTAL, CKMB, CKMBINDEX, TROPONINI in the last 168 hours. BNP (last 3 results) No results for input(s): PROBNP in the last 8760 hours. HbA1C: No results for input(s): HGBA1C in the last 72 hours. CBG: No results for input(s): GLUCAP in the last 168 hours. Lipid Profile: No results for input(s): CHOL, HDL, LDLCALC, TRIG, CHOLHDL, LDLDIRECT in the last 72 hours. Thyroid Function Tests: No results for input(s): TSH, T4TOTAL, FREET4, T3FREE, THYROIDAB in the last 72 hours. Anemia Panel: No results for input(s): VITAMINB12, FOLATE, FERRITIN, TIBC, IRON, RETICCTPCT in the last 72 hours. Urine analysis:    Component Value Date/Time   COLORURINE YELLOW 06/03/2019 1700   APPEARANCEUR CLEAR 06/03/2019 1700   LABSPEC 1.013 06/03/2019 1700   PHURINE 5.0 06/03/2019 1700   GLUCOSEU NEGATIVE 06/03/2019 1700   HGBUR NEGATIVE 06/03/2019 1700   BILIRUBINUR NEGATIVE 06/03/2019 1700   KETONESUR 5 (A) 06/03/2019 1700   PROTEINUR NEGATIVE 06/03/2019 1700   NITRITE NEGATIVE  06/03/2019 1700   LEUKOCYTESUR NEGATIVE 06/03/2019 1700    Radiological Exams on Admission: DG Chest Portable 1 View  Result Date: 06/30/2019 CLINICAL DATA:  Shortness of breath EXAM: PORTABLE CHEST 1 VIEW COMPARISON:  None. FINDINGS: Note shallow degree of inspiration. There is a rather minimal pleural effusion on each side. There is slight bibasilar atelectasis. The lungs elsewhere are clear. Heart size and pulmonary vascularity are normal. No adenopathy. No bone lesions. IMPRESSION: Rather minimal pleural effusions bilaterally with bibasilar atelectasis. Lungs elsewhere clear. Cardiac silhouette normal. Electronically Signed   By: Lowella Grip III M.D.   On: 06/30/2019 13:23    EKG: Independently reviewed. Sinus tachycardia. Borderline prolonged QT interval  Assessment/Plan Principal Problem:   Alcoholic cirrhosis of liver with ascites ([facility]) Active Problems:   Hyponatremia   Alcohol  dependence ([facility])   Hyperkalemia   AKI (acute kidney injury) ([place])    Decompensated liver cirrhosis with ascites: -Patient gets weekly paracentesis due to stage IV liver cirrhosis.  His last paracentesis was on 06/22/2019.  10 L removed. -Consulted IR for paracentesis and GI -We will give albumin during paracentesis. -Admit patient on the floor.  Continue to monitor vitals. -Hold Lasix and Aldactone for now.  Continue Bactrim -Strict INO's and daily weight -We will keep him n.p.o.  Zofran as needed for nausea and vomiting.  Hyponatremia: -Secondary to liver cirrhosis vs diuretics. -Patient is alert and oriented.  Will check urine osmolality, sodium osmolality, urine sodium level, TSH. -BMP every 4 hours  Hyperkalemia: Potassium 5.2. -Likely secondary to Aldactone -Lokelma given. -Monitor potassium level closely.  Will check magnesium level.  EKG no ST-T wave changes.  AKI: -GFR: 55, creatinine: 1.52, baseline creatinine is 1.01-1.11 -Monitor kidney function  History of esophageal varices and hypertensive gastropathy: -Patient denies history of melena.  H&H is stable.  Continue to monitor.  Please note: Patient tested positive for COVID-19.  His chest x-ray is negative for pneumonia.  He does not have any respiratory symptoms. Will hold off steroids/remdesivir for now.  DVT prophylaxis: TED/SCD Code Status: Full code Family Communication: Patient's wife present at bedside.  Plan of care discussed with patient and his wife at bedside in length and they verbalized understanding and agreed with it. Disposition Plan: To be determined Consults called: GI and IR Admission status: Inpatient   Mckinley Jewel MD [facility] Pager [phone]  If 7PM-7AM, please contact night-coverage [email or web address] Password [facility]  06/30/2019, 2:37 PM

## 2019-06-30 NOTE — ED Triage Notes (Signed)
Pt arrives to ED w/ c/o SOB. Pt was scheduled for echo today, however was unable to complete it d/t SOB. Pt presents w/ abdominal distention, reports 10/10 pain when sitting up.

## 2019-06-30 NOTE — Consult Note (Addendum)
[facility] Consult: 2:38 PM 06/30/2019  LOS: 0 days    Referring Provider: Dr Doristine Bosworth.    Primary Care Physician:  Patient, No Pcp Per Primary Gastroenterologist:  Dr. Havery Moros    Reason for Consultation: Hyponatremia.  Refractory ascites.   HPI: Shawn Martin is a 44 y.o. male.  PMH cirrhosis, ascites diagnosed in 07/2018.  History of heavy alcohol use but autoimmune markers elevated so there is question of AIH.  Celiac disease diagnosed at EGD 01/2019.  Thrombocytopenia.  Hyponatremia with sodium as low as 117 13/5/21. 02/08/2019 EGD: Grade 2 mid/distal esophageal varices.  Portal hypertensive gastropathy, biopsies obtained.  Diffuse mucosal flattening, scalloping and D2, biopsied Pathology showed increased intraepithelial lymphocytes and villous blunting in the duodenum. Gastric biopsy showed mild reactive gastropathy, no H. pylori, metaplasia, dysplasia, malignancy.  Patient intoxicated as recently as January 2021.  Periodically binges on bourbon. Seen during hospitalization about a month ago by nephrology regarding severe hyponatremia.  Treated with hypertonic saline, IV Lasix.  Refractory/recurrent ascites with 4 large volume paracentesis between 2/24 and 3/24 1.6 L, 5 L, 5 L, 10 L removed with post tap albumin infusions.  Has never had SBP. As of last Friday Dr. Havery Moros increase diuretics to Lasix 80 mg bid, Aldactone 400 mg daily. This past Monday Bactrim was added for SBP prophylaxis.  Patient has visit set with Dr Zollie Scale at [facility] hepatology 07/05/2019.  Following last week's paracentesis the patient felt well for 3 or 4 days but on Sunday started to reaccumulate ascites.  30 pound weight gain in the last 5 days, difficulty breathing.  Dr Havery Moros likes to get bmet  prior to paracentesis.  He  came to the ED so that he could get paracentesis today. Na is 121, was 131-week ago.  Creatinine is also gone from 1.0 to 1.5 over last week.  White cells normal.  Hb 12. T bili 2.5, alk phos 99.  AST/ALT 82/43.  Patient's appetite is good although with the increased abdominal bloating he cannot eat as much in one session.  He has daily bowel movements.  There is been no confusion.  Denies excessive/inappropriate bleeding or bruising.  No lower extremity or scrotal edema.  Dyspnea is with exertion.  Compliant with gluten-free diet  Last alcohol consumption was 2 months ago.  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  Family history negative for celiac disease, liver disease.    Past Medical History:  Diagnosis Date  . Celiac disease   . Hepatic cirrhosis ([place])   . Hypertension      Prior to Admission medications   Medication Sig Start Date End Date Taking? Authorizing Provider  diphenhydramine-acetaminophen (TYLENOL PM) 25-500 MG TABS tablet Take 1 tablet by mouth at bedtime as needed (sleep).   Yes [provider]  furosemide (LASIX) 40 MG tablet Take 2 tablets (80 mg total) by mouth 2 (two) times daily. 06/27/19  Yes Armbruster, [name], MD  magnesium gluconate (MAGONATE) 500 MG tablet Take 500 mg by mouth daily as needed (cramps).   Yes [provider]  Multiple Vitamin (MULTIVITAMIN WITH MINERALS) TABS tablet Take 1 tablet by mouth daily.   Yes [provider]  spironolactone (ALDACTONE) 100 MG tablet Take 4 tablets (400 mg total) by mouth daily. 06/27/19  Yes Armbruster, [name], MD  sulfamethoxazole-trimethoprim (BACTRIM DS) 800-160 MG tablet Take 1 tablet by mouth daily. 06/27/19  Yes Armbruster, [name], MD    Scheduled Meds: . furosemide  80 mg Oral BID  . multivitamin with minerals  1 tablet Oral Daily  . sodium zirconium cyclosilicate  5 g Oral Once  . spironolactone  400 mg Oral Daily  . sulfamethoxazole-trimethoprim  1 tablet Oral  Daily   Infusions: . albumin human     PRN Meds: acetaminophen **OR** acetaminophen, magnesium gluconate, ondansetron **OR** ondansetron (ZOFRAN) IV   Allergies as of 06/30/2019 - Review Complete 06/30/2019  Allergen Reaction Noted  . Gluten meal Diarrhea 03/21/2019    Family History  Problem Relation Age of Onset  . Healthy Mother   . Hypertension Father   . Heart attack Father   . Leukemia Brother   . Colon cancer Neg Hx   . Stomach cancer Neg Hx   . Pancreatic cancer Neg Hx     Social History   Socioeconomic History  . Marital status: Married    Spouse name: Not on file  . Number of children: Not on file  . Years of education: Not on file  . Highest education level: Not on file  Occupational History  . Not on file  Tobacco Use  . Smoking status: Never Smoker  . Smokeless tobacco: Former Systems developer    Types: Chew  Substance and Sexual Activity  . Alcohol use: Not Currently    Comment: every day drinker, 6 pack per day  . Drug use: Never  . Sexual activity: Yes    Partners: Female  Other Topics Concern  . Not on file  Social History Narrative  . Not on file   Social Determinants of Health   Financial Resource Strain:   . Difficulty of Paying Living Expenses:   Food Insecurity:   . Worried About Charity fundraiser in the Last Year:   . Arboriculturist in the Last Year:   Transportation Needs:   . Film/video editor (Medical):   Marland Kitchen Lack of Transportation (Non-Medical):   Physical Activity:   . Days of Exercise per Week:   . Minutes of Exercise per Session:   Stress:   . Feeling of Stress :   Social Connections:   . Frequency of Communication with Friends and Family:   . Frequency of Social Gatherings with Friends and Family:   . Attends Religious Services:   . Active Member of Clubs or Organizations:   . Attends Archivist Meetings:   Marland Kitchen Marital Status:   Intimate Partner Violence:   . Fear of Current or Ex-Partner:   . Emotionally  Abused:   Marland Kitchen Physically Abused:   . Sexually Abused:     REVIEW OF SYSTEMS: Constitutional: See HPI.  Some mild malaise and fatigue. ENT:  No nose bleeds Pulm: See HPI. CV:  No palpitations, no LE edema.  No angina GU:  No hematuria, no frequency.  Urinating well.  No dark-colored urine. GI: No nausea or vomiting.  No dysphagia.  No melena. Heme: See HPI. Transfusions: No previous blood transfusions. Neuro:  No headaches, no peripheral tingling or numbness.  No syncope.  No seizures. Derm:  No itching, no  rash or sores.  Endocrine:  No sweats or chills.  No polyuria or dysuria Immunization: Vaccinated in February and March 2021 for hep a, hep B. Travel:  None beyond local counties in last few months.    PHYSICAL EXAM: Vital signs in last 24 hours: Vitals:   06/30/19 1300 06/30/19 1315  BP:  130/78  Pulse: (!) 104   Resp: (!) 25 (!) 21  Temp:    SpO2: 98%    Wt Readings from Last 3 Encounters:  06/22/19 105 kg  06/09/19 104.4 kg  06/03/19 104.3 kg    General: Chronically ill WM.  Despite his large abdominal distention, he looks malnourished and cachectic.  Comfortable and alert. Head: No facial asymmetry or swelling. Eyes: No scleral icterus.  No conjunctival pallor.  EOMI. Ears: Not hard of hearing Nose: No congestion or discharge. Mouth: Mucosa is moist, pink, clear.  Tongue midline.  Excellent dentition. Neck: No JVD, no masses, no thyromegaly. Lungs: Clear bilaterally throughout the lung fields.  No labored breathing.  No cough. Heart: RRR.  No MRG.  S1, S2 present Abdomen:   Markedly distended/protuberant, moderately tense.  Not tender.  Unable to appreciate any organomegaly.  No bruits.  Slight umbilical hernia.  Hypoactive bowel sounds but no tinkling or tympanic bowel sounds. Rectal: Deferred Musc/Skeltl: Sarcopenia.  No joint swelling or gross deformities. Extremities: No lower extremity edema. Neurologic: No tremors, no asterixis.  Moves all 4 limbs.   Strength not tested but no gross weakness or deficits. Skin: A few telangiectasia on the upper chest. Nodes: No cervical adenopathy. Psych: Cooperative, pleasant, calm.  Intake/Output from previous day: No intake/output data recorded. Intake/Output this shift: No intake/output data recorded.  LAB RESULTS: Recent Labs    06/30/19 1151  WBC 5.3  HGB 12.0*  HCT 34.6*  PLT 189   BMET Lab Results  Component Value Date   NA 121 (L) 06/30/2019   NA 130 (L) 06/24/2019   NA 127 (L) 06/20/2019   K 5.2 (H) 06/30/2019   K 4.5 06/24/2019   K 4.7 06/20/2019   CL 88 (L) 06/30/2019   CL 97 06/24/2019   CL 96 06/20/2019   CO2 20 (L) 06/30/2019   CO2 30 06/24/2019   CO2 28 06/20/2019   GLUCOSE 103 (H) 06/30/2019   GLUCOSE 88 06/24/2019   GLUCOSE 128 (H) 06/20/2019   BUN 20 06/30/2019   BUN 15 06/24/2019   BUN 17 06/20/2019   CREATININE 1.52 (H) 06/30/2019   CREATININE 1.01 06/24/2019   CREATININE 1.11 06/20/2019   CALCIUM 8.4 (L) 06/30/2019   CALCIUM 8.1 (L) 06/24/2019   CALCIUM 8.0 (L) 06/20/2019   LFT Recent Labs    06/30/19 1151  PROT 6.6  ALBUMIN 2.1*  AST 82*  ALT 43  ALKPHOS 99  BILITOT 2.5*   PT/INR Lab Results  Component Value Date   INR 1.1 06/22/2019   INR 1.3 (H) 06/13/2019   INR 1.3 (H) 06/03/2019   Hepatitis Panel No results for input(s): HEPBSAG, HCVAB, HEPAIGM, HEPBIGM in the last 72 hours. C-Diff No components found for: CDIFF Lipase     Component Value Date/Time   LIPASE 43 06/30/2019 1151    Drugs of Abuse     Component Value Date/Time   LABOPIA NONE DETECTED 06/03/2019 1700   COCAINSCRNUR NONE DETECTED 06/03/2019 1700   LABBENZ NONE DETECTED 06/03/2019 1700   AMPHETMU NONE DETECTED 06/03/2019 1700   THCU NONE DETECTED 06/03/2019 1700   LABBARB NONE DETECTED 06/03/2019 1700  RADIOLOGY STUDIES: DG Chest Portable 1 View  Result Date: 06/30/2019 CLINICAL DATA:  Shortness of breath EXAM: PORTABLE CHEST 1 VIEW COMPARISON:  None.  FINDINGS: Note shallow degree of inspiration. There is a rather minimal pleural effusion on each side. There is slight bibasilar atelectasis. The lungs elsewhere are clear. Heart size and pulmonary vascularity are normal. No adenopathy. No bone lesions. IMPRESSION: Rather minimal pleural effusions bilaterally with bibasilar atelectasis. Lungs elsewhere clear. Cardiac silhouette normal. Electronically Signed   By: Lowella Grip III M.D.   On: 06/30/2019 13:23     IMPRESSION:   *   Decompensated cirrhosis w massive, recurrent ascites, despite increase to large doses of Lasix, Aldactone less than a week ago.  Has required for large-volume paracentesis over the course of 1 month. 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  *     Acute on chronic hyponatremia.  In setting of new, larger doses of diuretics and 10 L paracentesis 1 week ago.  *    AKI.  Creatinine has climed over the last month from 0.7 to 1.5  *     Celiac disease.  Patient and his wife confirm he has been compliant with gluten-free diet.    PLAN:     *   Probably going to have to down dose his diuretics, hold for now.  .  *   Is pt a TIPS candidate for management of refractory ascites?   *    Repeat paracentesis ordered for today, patient has already received a dose of albumin.  I am going to extend the albumin for 3 more doses.  *    Modified paracentesis order so that they will remove no more than 6 L.  *   Monitor CMET.  uriine Na is pending.    *   No need for this patient to be NPO.  Ordered regular diet.  *    Not sure that he needs the daily Bactrim but Dr. Havery Moros added this earlier this week and will leave in place for now.  *   Hold diuretics for now.     Azucena Freed  06/30/2019, 2:38 PM Phone [phone]    [facility] Attending   I have taken an interval history, reviewed the chart and examined the patient. I agree with the Advanced Practitioner's note, impression and  recommendations.   Worsening ascites, kidney fx and hyponatremia  Could have hepatorenal syndrome.  Await the urine studies - low threshold to get renal help.  Should get an Echocardiogram to check LV function as part of TIPS eval will order  Seems Asx from Covid ? Past infection vs current  Bactrim is for SBP prophylaxis - since it may also worsen creatinine and he has not had SBP will dc  Gatha Mayer, MD, [facility] 06/30/2019 7:13 PM

## 2019-06-30 NOTE — Plan of Care (Signed)

## 2019-07-01 ENCOUNTER — Encounter (HOSPITAL_COMMUNITY): Payer: Self-pay

## 2019-07-01 ENCOUNTER — Inpatient Hospital Stay (HOSPITAL_COMMUNITY): Payer: Self-pay

## 2019-07-01 DIAGNOSIS — M7989 Other specified soft tissue disorders: Secondary | ICD-10-CM

## 2019-07-01 HISTORY — PX: IR PARACENTESIS: IMG2679

## 2019-07-01 LAB — COMPREHENSIVE METABOLIC PANEL
ALT: 34 U/L (ref 0–44)
AST: 61 U/L — ABNORMAL HIGH (ref 15–41)
Albumin: 2.3 g/dL — ABNORMAL LOW (ref 3.5–5.0)
Alkaline Phosphatase: 83 U/L (ref 38–126)
Anion gap: 9 (ref 5–15)
BUN: 21 mg/dL — ABNORMAL HIGH (ref 6–20)
CO2: 23 mmol/L (ref 22–32)
Calcium: 8.4 mg/dL — ABNORMAL LOW (ref 8.9–10.3)
Chloride: 88 mmol/L — ABNORMAL LOW (ref 98–111)
Creatinine, Ser: 1.31 mg/dL — ABNORMAL HIGH (ref 0.61–1.24)
GFR calc Af Amer: >60 mL/min (ref >60)
GFR calc non Af Amer: >60 mL/min (ref >60)
Glucose, Bld: 92 mg/dL (ref 70–99)
Potassium: 5.1 mmol/L (ref 3.5–5.1)
Sodium: 120 mmol/L — ABNORMAL LOW (ref 135–145)
Total Bilirubin: 2.4 mg/dL — ABNORMAL HIGH (ref 0.3–1.2)
Total Protein: 6 g/dL — ABNORMAL LOW (ref 6.5–8.1)

## 2019-07-01 LAB — URINALYSIS, ROUTINE W REFLEX MICROSCOPIC
Bilirubin Urine: NEGATIVE
Glucose, UA: NEGATIVE mg/dL
Hgb urine dipstick: NEGATIVE
Ketones, ur: 5 mg/dL — AB
Leukocytes,Ua: NEGATIVE
Nitrite: NEGATIVE
Protein, ur: NEGATIVE mg/dL
Specific Gravity, Urine: 1.019 (ref 1.005–1.030)
pH: 6 (ref 5.0–8.0)

## 2019-07-01 LAB — C-REACTIVE PROTEIN: CRP: 0.7 mg/dL (ref <1.0)

## 2019-07-01 LAB — URIC ACID: Uric Acid, Serum: 4.6 mg/dL (ref 3.7–8.6)

## 2019-07-01 LAB — CBC
HCT: 30 % — ABNORMAL LOW (ref 39.0–52.0)
Hemoglobin: 10.7 g/dL — ABNORMAL LOW (ref 13.0–17.0)
MCH: 32.4 pg (ref 26.0–34.0)
MCHC: 35.7 g/dL (ref 30.0–36.0)
MCV: 90.9 fL (ref 80.0–100.0)
Platelets: 149 10*3/uL — ABNORMAL LOW (ref 150–400)
RBC: 3.3 MIL/uL — ABNORMAL LOW (ref 4.22–5.81)
RDW: 15.4 % (ref 11.5–15.5)
WBC: 5.2 10*3/uL (ref 4.0–10.5)
nRBC: 0 % (ref 0.0–0.2)

## 2019-07-01 LAB — BODY FLUID CELL COUNT WITH DIFFERENTIAL
Eos, Fluid: 2 %
Lymphs, Fluid: 77 %
Monocyte-Macrophage-Serous Fluid: 20 % — ABNORMAL LOW (ref 50–90)
Neutrophil Count, Fluid: 1 % (ref 0–25)
Total Nucleated Cell Count, Fluid: 61 cu mm (ref 0–1000)

## 2019-07-01 LAB — PROTIME-INR
INR: 1.2 (ref 0.8–1.2)
Prothrombin Time: 15.3 seconds — ABNORMAL HIGH (ref 11.4–15.2)

## 2019-07-01 LAB — OSMOLALITY: Osmolality: 257 mOsm/kg — ABNORMAL LOW (ref 275–295)

## 2019-07-01 LAB — D-DIMER, QUANTITATIVE: D-Dimer, Quant: 6.9 ug/mL-FEU — ABNORMAL HIGH (ref 0.00–0.50)

## 2019-07-01 LAB — CREATININE, URINE, RANDOM: Creatinine, Urine: 137.12 mg/dL

## 2019-07-01 LAB — IRON AND TIBC
Iron: 57 ug/dL (ref 45–182)
Saturation Ratios: 18 % (ref 17.9–39.5)
TIBC: 322 ug/dL (ref 250–450)
UIBC: 265 ug/dL

## 2019-07-01 LAB — OSMOLALITY, URINE: Osmolality, Ur: 603 mOsm/kg (ref 300–900)

## 2019-07-01 LAB — BRAIN NATRIURETIC PEPTIDE: B Natriuretic Peptide: 145.7 pg/mL — ABNORMAL HIGH (ref 0.0–100.0)

## 2019-07-01 LAB — AMMONIA: Ammonia: 20 umol/L (ref 9–35)

## 2019-07-01 LAB — SODIUM, URINE, RANDOM: Sodium, Ur: 74 mmol/L

## 2019-07-01 MED ORDER — MIDODRINE HCL 5 MG PO TABS
10.0000 mg | ORAL_TABLET | Freq: Three times a day (TID) | ORAL | Status: DC
  Administered 2019-07-01 – 2019-07-04 (×11): 10 mg via ORAL
  Filled 2019-07-01 (×11): qty 2

## 2019-07-01 MED ORDER — ALBUMIN HUMAN 25 % IV SOLN: 12.5000 g | Freq: Once | INTRAVENOUS | Status: DC

## 2019-07-01 MED ORDER — LIDOCAINE HCL 1 % IJ SOLN
INTRAMUSCULAR | Status: DC | PRN
  Administered 2019-07-01: 10 mL

## 2019-07-01 MED ORDER — FUROSEMIDE 10 MG/ML IJ SOLN
80.0000 mg | Freq: Two times a day (BID) | INTRAMUSCULAR | Status: DC
  Administered 2019-07-01: 80 mg via INTRAVENOUS
  Filled 2019-07-01: qty 8

## 2019-07-01 MED ORDER — ALBUMIN HUMAN 25 % IV SOLN
50.0000 g | Freq: Once | INTRAVENOUS | Status: AC
  Administered 2019-07-01: 50 g via INTRAVENOUS
  Filled 2019-07-01: qty 200

## 2019-07-01 MED ORDER — TOLVAPTAN 15 MG PO TABS
15.0000 mg | ORAL_TABLET | ORAL | Status: DC
  Administered 2019-07-01: 15 mg via ORAL
  Filled 2019-07-01 (×2): qty 1

## 2019-07-01 MED ORDER — LIDOCAINE HCL 1 % IJ SOLN
INTRAMUSCULAR | Status: AC
  Filled 2019-07-01: qty 20

## 2019-07-01 NOTE — Progress Notes (Signed)
Bilateral lower extremity venous duplex exam completed.  Preliminary results can be found under CV proc under chart review.  07/01/2019 4:20 PM  Deigo Alonso, K., RDMS, RVT

## 2019-07-01 NOTE — Plan of Care (Signed)

## 2019-07-01 NOTE — Consult Note (Signed)
[facility] Renal Consultation Note  Requesting MD:  Indication for Consultation: Acute kidney injury, decompensated liver disease, management and treatment of anemia, maintenance of euvolemia, evaluation and treatment of electrolyte abnormalities.  HPI:   Shawn Martin is a 44 y.o. male.  With a history of alcoholic liver disease.  Quit January 2021.  Also has a history of celiac disease, hepatitis C, hypertension, osteoporosis, portal hypertension and gastropathy presented to the emergency increasing abdominal bloating and shortness of breath.  Was found to have massive accumulation of recurrent ascites.  He is followed by a GI physician Dr. Havery Moros.  He is to undergo a large-volume paracentesis 07/01/2019.  Blood pressure 130/78 pulse 98 temperature 97.7 O2 sats 9 9% room air.  Sodium 120 potassium 5.1 chloride 88 CO2 23 BUN 21 creatinine 1.3 glucose 92 calcium 8.4 albumin 2.3 AST 61 ALT 34 ammonia 20 total bilirubin 2.4 hemoglobin 10.7 platelets 149  Home medications Lasix 80 mg twice daily, magnesium 500 mg as needed multivitamins 1 daily spironolactone 400 mg daily Bactrim 1 tablet daily.  No recorded urine output weight 110.1 kg  Urine osmolality 368 urine sodium 75 urinalysis 0 red blood cells 0 white blood cells negative for protein  Last abdominal ultrasound was performed on 02/07/2019 cirrhosis portal vein with hepatopetal flow ascites no gallstones right pleural effusion  Midodrine 10 mg 3 times daily multivitamins 1 daily Samsca 15 mg     Creatinine, Ser  Date/Time Value Ref Range Status  07/01/2019 05:28 AM 1.31 (H) 0.61 - 1.24 mg/dL Final  06/30/2019 11:51 AM 1.52 (H) 0.61 - 1.24 mg/dL Final  06/24/2019 02:33 PM 1.01 0.40 - 1.50 mg/dL Final  06/20/2019 11:14 AM 1.11 0.40 - 1.50 mg/dL Final  06/13/2019 02:10 PM 0.93 0.40 - 1.50 mg/dL Final  06/09/2019 02:46 PM 0.79 0.40 - 1.50 mg/dL Final  06/06/2019 01:42 AM 0.88 0.61 - 1.24 mg/dL Final   06/05/2019 02:07 AM 0.90 0.61 - 1.24 mg/dL Final  06/04/2019 01:57 PM 0.73 0.61 - 1.24 mg/dL Final  06/04/2019 08:27 AM 0.98 0.61 - 1.24 mg/dL Final  06/04/2019 03:57 AM 0.96 0.61 - 1.24 mg/dL Final  06/04/2019 12:09 AM 0.97 0.61 - 1.24 mg/dL Final  06/03/2019 08:32 PM 0.94 0.61 - 1.24 mg/dL Final  06/03/2019 03:37 PM 1.01 0.61 - 1.24 mg/dL Final  06/03/2019 04:38 AM 1.01 0.61 - 1.24 mg/dL Final  05/24/2019 05:02 PM 0.85 0.40 - 1.50 mg/dL Final  04/24/2019 11:01 PM 0.74 0.61 - 1.24 mg/dL Final  02/04/2019 09:25 AM 0.68 0.40 - 1.50 mg/dL Final  08/07/2018 11:37 AM 0.67 0.61 - 1.24 mg/dL Final     PMHx:   Past Medical History:  Diagnosis Date  . Celiac disease   . Hepatic cirrhosis ([place])   . Hypertension     Past Surgical History:  Procedure Laterality Date  . IR PARACENTESIS  02/07/2019  . IR PARACENTESIS  05/25/2019  . IR PARACENTESIS  06/10/2019  . IR PARACENTESIS  06/15/2019  . IR PARACENTESIS  06/22/2019  . IR TRANSCATHETER BX  06/22/2019  . IR US GUIDE VASC ACCESS RIGHT  06/22/2019  . IR VENOGRAM HEPATIC WO HEMODYNAMIC EVALUATION  06/22/2019  . NOSE SURGERY      Family Hx:  Family History  Problem Relation Age of Onset  . Healthy Mother   . Hypertension Father   . Heart attack Father   . Leukemia Brother   . Colon cancer Neg Hx   . Stomach cancer Neg Hx   .  Pancreatic cancer Neg Hx     Social History:  reports that he has never smoked. He has quit using smokeless tobacco.  His smokeless tobacco use included chew. He reports previous alcohol use. He reports that he does not use drugs.  Allergies:  Allergies  Allergen Reactions  . Gluten Meal Diarrhea    bloating    Medications: Prior to Admission medications   Medication Sig Start Date End Date Taking? Authorizing Provider  diphenhydramine-acetaminophen (TYLENOL PM) 25-500 MG TABS tablet Take 1 tablet by mouth at bedtime as needed (sleep).   Yes [provider]  furosemide (LASIX) 40 MG tablet Take  2 tablets (80 mg total) by mouth 2 (two) times daily. 06/27/19  Yes Armbruster, [name], MD  magnesium gluconate (MAGONATE) 500 MG tablet Take 500 mg by mouth daily as needed (cramps).   Yes [provider]  Multiple Vitamin (MULTIVITAMIN WITH MINERALS) TABS tablet Take 1 tablet by mouth daily.   Yes [provider]  spironolactone (ALDACTONE) 100 MG tablet Take 4 tablets (400 mg total) by mouth daily. 06/27/19  Yes Armbruster, [name], MD  sulfamethoxazole-trimethoprim (BACTRIM DS) 800-160 MG tablet Take 1 tablet by mouth daily. 06/27/19  Yes Armbruster, [name], MD     Labs:  Results for orders placed or performed during the hospital encounter of 06/30/19 (from the past 48 hour(s))  Comprehensive metabolic panel     Status: Abnormal   Collection Time: 06/30/19 11:51 AM  Result Value Ref Range   Sodium 121 (L) 135 - 145 mmol/L   Potassium 5.2 (H) 3.5 - 5.1 mmol/L   Chloride 88 (L) 98 - 111 mmol/L   CO2 20 (L) 22 - 32 mmol/L   Glucose, Bld 103 (H) 70 - 99 mg/dL    Comment: Glucose reference range applies only to samples taken after fasting for at least 8 hours.   BUN 20 6 - 20 mg/dL   Creatinine, Ser 1.52 (H) 0.61 - 1.24 mg/dL   Calcium 8.4 (L) 8.9 - 10.3 mg/dL   Total Protein 6.6 6.5 - 8.1 g/dL   Albumin 2.1 (L) 3.5 - 5.0 g/dL   AST 82 (H) 15 - 41 U/L   ALT 43 0 - 44 U/L   Alkaline Phosphatase 99 38 - 126 U/L   Total Bilirubin 2.5 (H) 0.3 - 1.2 mg/dL   GFR calc non Af Amer 55 (L) >60 mL/min   GFR calc Af Amer >60 >60 mL/min   Anion gap 13 5 - 15    Comment: Performed at [facility] [address], [place], [place] [address]  Lipase, blood     Status: None   Collection Time: 06/30/19 11:51 AM  Result Value Ref Range   Lipase 43 11 - 51 U/L    Comment: Performed at [facility] [address], [place], [place] [address]  CBC with Differential     Status: Abnormal   Collection Time: 06/30/19 11:51 AM  Result Value Ref Range   WBC 5.3 4.0 - 10.5  K/uL   RBC 3.81 (L) 4.22 - 5.81 MIL/uL   Hemoglobin 12.0 (L) 13.0 - 17.0 g/dL   HCT 34.6 (L) 39.0 - 52.0 %   MCV 90.8 80.0 - 100.0 fL   MCH 31.5 26.0 - 34.0 pg   MCHC 34.7 30.0 - 36.0 g/dL   RDW 15.5 11.5 - 15.5 %   Platelets 189 150 - 400 K/uL   nRBC 0.0 0.0 - 0.2 %  Neutrophils Relative % 60 %   Neutro Abs 3.2 1.7 - 7.7 K/uL   Lymphocytes Relative 21 %   Lymphs Abs 1.1 0.7 - 4.0 K/uL   Monocytes Relative 14 %   Monocytes Absolute 0.8 0.1 - 1.0 K/uL   Eosinophils Relative 3 %   Eosinophils Absolute 0.2 0.0 - 0.5 K/uL   Basophils Relative 1 %   Basophils Absolute 0.0 0.0 - 0.1 K/uL   Immature Granulocytes 1 %   Abs Immature Granulocytes 0.04 0.00 - 0.07 K/uL    Comment: Performed at [facility] [address], [place], [place] [address]  Magnesium     Status: None   Collection Time: 06/30/19 11:51 AM  Result Value Ref Range   Magnesium 2.0 1.7 - 2.4 mg/dL    Comment: Performed at [facility] [address] [place], [place] [address]  Respiratory Panel by RT PCR (Flu A&B, Covid) - Nasopharyngeal Swab     Status: Abnormal   Collection Time: 06/30/19  2:10 PM   Specimen: Nasopharyngeal Swab  Result Value Ref Range   SARS Coronavirus 2 by RT PCR POSITIVE (A) NEGATIVE    Comment: RESULT CALLED TO, READ BACK BY AND VERIFIED WITH: Chalmers Cater RN 16:00 06/30/19 ([email or web address]) (NOTE) SARS-CoV-2 target nucleic acids are DETECTED. SARS-CoV-2 RNA is generally detectable in upper respiratory specimens  during the acute phase of infection. Positive results are indicative of the presence of the identified virus, but do not rule out bacterial infection or co-infection with other pathogens not detected by the test. Clinical correlation with patient history and other diagnostic information is necessary to determine patient infection status. The expected result is Negative. Fact Sheet for Patients:  PinkCheek.be Fact Sheet for Healthcare  Providers: GravelBags.it This test is not yet approved or cleared by the [place] FDA and  has been authorized for detection and/or diagnosis of SARS-CoV-2 by FDA under an Emergency Use Authorization (EUA).  This EUA will remain in effect (meaning this test can be used) f or the duration of  the COVID-19 declaration under Section 564(b)(1) of the Act, 21 U.S.C. section 360bbb-3(b)(1), unless the authorization is terminated or revoked sooner.    Influenza A by PCR NEGATIVE NEGATIVE   Influenza B by PCR NEGATIVE NEGATIVE    Comment: (NOTE) The Xpert Xpress SARS-CoV-2/FLU/RSV assay is intended as an aid in  the diagnosis of influenza from Nasopharyngeal swab specimens and  should not be used as a sole basis for treatment. Nasal washings and  aspirates are unacceptable for Xpert Xpress SARS-CoV-2/FLU/RSV  testing. Fact Sheet for Patients: PinkCheek.be Fact Sheet for Healthcare Providers: GravelBags.it This test is not yet approved or cleared by the [place] FDA and  has been authorized for detection and/or diagnosis of SARS-CoV-2 by  FDA under an Emergency Use Authorization (EUA). This EUA will remain  in effect (meaning this test can be used) for the duration of the  Covid-19 declaration under Section 564(b)(1) of the Act, 21  U.S.C. section 360bbb-3(b)(1), unless the authorization is  terminated or revoked. Performed at [facility], [place] [address], [place],  [address]   Urinalysis, Routine w reflex microscopic     Status: None   Collection Time: 06/30/19  2:20 PM  Result Value Ref Range   Color, Urine YELLOW YELLOW   APPearance CLEAR CLEAR   Specific Gravity, Urine 1.009 1.005 - 1.030   pH 6.0 5.0 - 8.0   Glucose, UA NEGATIVE NEGATIVE mg/dL  Hgb urine dipstick NEGATIVE NEGATIVE   Bilirubin Urine NEGATIVE NEGATIVE   Ketones, ur NEGATIVE NEGATIVE mg/dL   Protein, ur  NEGATIVE NEGATIVE mg/dL   Nitrite NEGATIVE NEGATIVE   Leukocytes,Ua NEGATIVE NEGATIVE    Comment: Performed at [facility] [address] [place], [place] [address]  APTT     Status: None   Collection Time: 06/30/19  2:20 PM  Result Value Ref Range   aPTT 34 24 - 36 seconds    Comment: Performed at [facility] [address] [place], [place] [address]  Protime-INR     Status: None   Collection Time: 06/30/19  2:20 PM  Result Value Ref Range   Prothrombin Time 15.2 11.4 - 15.2 seconds   INR 1.2 0.8 - 1.2    Comment: (NOTE) INR goal varies based on device and disease states. Performed at [facility], [place] [address], [place], [place] [address]   Osmolality     Status: Abnormal   Collection Time: 06/30/19  2:20 PM  Result Value Ref Range   Osmolality 256 (L) 275 - 295 mOsm/kg    Comment: Performed at [facility], [place] [address] [place], [place] [address]  Sodium, urine, random     Status: None   Collection Time: 06/30/19  2:20 PM  Result Value Ref Range   Sodium, Ur 75 mmol/L    Comment: Performed at [facility] [address], [place], [place] [address]  Osmolality, urine     Status: None   Collection Time: 06/30/19  2:26 PM  Result Value Ref Range   Osmolality, Ur 368 300 - 900 mOsm/kg    Comment: Performed at [facility] [address] [place], [place] [address]  TSH     Status: None   Collection Time: 06/30/19  2:32 PM  Result Value Ref Range   TSH 3.126 0.350 - 4.500 uIU/mL    Comment: Performed by a 3rd Generation assay with a functional sensitivity of <=0.01 uIU/mL. Performed at [facility], [place] [address] [place], [place] [address]   Comprehensive metabolic panel     Status: Abnormal   Collection Time: 07/01/19  5:28 AM  Result Value Ref Range   Sodium 120 (L) 135 - 145 mmol/L   Potassium 5.1 3.5 - 5.1 mmol/L   Chloride 88 (L) 98 - 111 mmol/L   CO2 23 22 - 32 mmol/L   Glucose, Bld 92 70 - 99 mg/dL     Comment: Glucose reference range applies only to samples taken after fasting for at least 8 hours.   BUN 21 (H) 6 - 20 mg/dL   Creatinine, Ser 1.31 (H) 0.61 - 1.24 mg/dL   Calcium 8.4 (L) 8.9 - 10.3 mg/dL   Total Protein 6.0 (L) 6.5 - 8.1 g/dL   Albumin 2.3 (L) 3.5 - 5.0 g/dL   AST 61 (H) 15 - 41 U/L   ALT 34 0 - 44 U/L   Alkaline Phosphatase 83 38 - 126 U/L   Total Bilirubin 2.4 (H) 0.3 - 1.2 mg/dL   GFR calc non Af Amer >60 >60 mL/min   GFR calc Af Amer >60 >60 mL/min   Anion gap 9 5 - 15    Comment: Performed at [facility] [address], [place], [place] [address]  CBC     Status: Abnormal   Collection Time: 07/01/19  5:28 AM  Result Value Ref Range  WBC 5.2 4.0 - 10.5 K/uL   RBC 3.30 (L) 4.22 - 5.81 MIL/uL   Hemoglobin 10.7 (L) 13.0 - 17.0 g/dL   HCT 30.0 (L) 39.0 - 52.0 %   MCV 90.9 80.0 - 100.0 fL   MCH 32.4 26.0 - 34.0 pg   MCHC 35.7 30.0 - 36.0 g/dL   RDW 15.4 11.5 - 15.5 %   Platelets 149 (L) 150 - 400 K/uL   nRBC 0.0 0.0 - 0.2 %    Comment: Performed at [facility], [place] [address] [place], [place] [address]  Osmolality     Status: Abnormal   Collection Time: 07/01/19  8:18 AM  Result Value Ref Range   Osmolality 257 (L) 275 - 295 mOsm/kg    Comment: Performed at [facility], [place] [address] [place], [place] [address]  Uric acid     Status: None   Collection Time: 07/01/19  8:18 AM  Result Value Ref Range   Uric Acid, Serum 4.6 3.7 - 8.6 mg/dL    Comment: Performed at [facility] [address] [place], [place] [address]  Protime-INR     Status: Abnormal   Collection Time: 07/01/19  8:18 AM  Result Value Ref Range   Prothrombin Time 15.3 (H) 11.4 - 15.2 seconds   INR 1.2 0.8 - 1.2    Comment: (NOTE) INR goal varies based on device and disease states. Performed at [facility], [place] [address], [place], [place] [address]   Ammonia     Status: None   Collection Time: 07/01/19  8:18 AM  Result Value Ref Range   Ammonia  20 9 - 35 umol/L    Comment: Performed at [facility], [place] [address] [place], [place] [address]     ROS: General positive fatigue and positive weak Eyes no visual complaints Ears nose mouth throat no hearing loss epistaxis Cardiovascular no anginal chest pain Respiratory system no cough wheeze hemoptysis Abdominal system increasing abdominal distention and bloating Urogenital no urgency frequency or dysuria Dermatologic no skin lesions Endocrine no diabetes or thyroid disorder Neurologic no stroke no seizures    Physical Exam: Vitals:   07/01/19 0400 07/01/19 0824  BP:  122/78  Pulse: 95 98  Resp: 16 20  Temp:  97.7 F (36.5 C)  SpO2: 93% 96%     General: Nondistressed, communicating well HEENT: Normocephalic atraumatic Eyes: Extraocular movements intact Neck: Supple no JVP no thyromegaly or masses Heart: Regular rate and rhythm no murmurs rubs or gallops Lungs: Clear to auscultation Abdomen: Distended tympanic Extremities: 2+ pitting edema 2+ pedal pulses Skin: No rashes or lesions no ulcers Neuro: Intact judgment nonfocal  Assessment/Plan:  1.Acute kidney injury.  This appears to be in the setting of decompensated liver disease.  Urine sediment is bland renal ultrasound in November did not reveal any evidence of hydronephrosis.  He scheduled for thoracentesis later today.  He has massive tense ascites and is a possibility that this could represent abdominal compartment syndrome.  The treatment of that would be drainage of his ascites ascitic fluid.  I do not think there is any need to measure bladder pressures.  They do not appear to be the administration of any nephrotoxic agents he is at no CT scan with contrast, no use of nonsteroidal anti-inflammatory drugs, no Cox 2 inhibitors, no ACE inhibitor is no ARB's.  He is clearly hypervolemic.  I think it reasonable to obtain another renal ultrasound for  completion.  It is conceivable that this could represent acute  tubular necrosis.  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  This would be a more slowly progressive form of renal failure and ultimate end-stage renal disease than the type I variety.  Urine sodium being greater than 10 but suggest this is not the type I variety.  I am not sure that therapy with octreotide would be needed at this point although patient is receiving midodrine and albumin infusions. 2.  Hyponatremia.  Renal osmolality is high and urine sodium is elevated this would point to inappropriate secretion of antidiuretic hormone.  Samsca would not ordinarily be appropriate in this patient.  The best therapy would be Lasix and will free water restriction.  However I would expect to see a response with Samsca due to its effect on increasing free water excretion. 3.  Cirrhosis and decompensated liver disease.  The question will be whether the patient is needing to be seen by hepatology at [facility].  I will defer this to gastroenterology. 4.  Hypertension/volume appears to be volume overloaded would benefit from IV diuresis will start Lasix 80 mg every 12 hours. 5.  Anemia check iron studies.  And transfuse to maintain hemoglobin above 8 g/dL   Sherril Croon 07/01/2019, 11:30 AM

## 2019-07-01 NOTE — Progress Notes (Addendum)
Daily Rounding Note  07/01/2019, 9:01 AM  LOS: 1 day   SUBJECTIVE:   Chief complaint:  Refractory ascites.  Hyponatremia.  Covid 19 positive.     Other than the abdominal distention,  No complaints.    OBJECTIVE:         Vital signs in last 24 hours:    Temp:  [97.7 F (36.5 C)-98.4 F (36.9 C)] 97.7 F (36.5 C) (04/02 0824) Pulse Rate:  [79-104] 98 (04/02 0824) Resp:  [16-25] 20 (04/02 0824) BP: (102-133)/(52-90) 122/78 (04/02 0824) SpO2:  [93 %-99 %] 96 % (04/02 0824) Weight:  [110.1 kg] 110.1 kg (04/02 0452) Last BM Date: 06/30/19 Filed Weights   07/01/19 0452  Weight: 110.1 kg   Pt was interviewed on the phone.   General: alert, comfortable.    Heart: NSR at 100 per monitor Chest:  No dyspnea w speech.   Abdomen: distended per RN  Neuro/Psych:  Oriented x 3.  Not confused, fluid speech.     Lab Results: Recent Labs    06/30/19 1151 07/01/19 0528  WBC 5.3 5.2  HGB 12.0* 10.7*  HCT 34.6* 30.0*  PLT 189 149*   BMET Recent Labs    06/30/19 1151 07/01/19 0528  NA 121* 120*  K 5.2* 5.1  CL 88* 88*  CO2 20* 23  GLUCOSE 103* 92  BUN 20 21*  CREATININE 1.52* 1.31*  CALCIUM 8.4* 8.4*   LFT Recent Labs    06/30/19 1151 07/01/19 0528  PROT 6.6 6.0*  ALBUMIN 2.1* 2.3*  AST 82* 61*  ALT 43 34  ALKPHOS 99 83  BILITOT 2.5* 2.4*   PT/INR Recent Labs    06/30/19 1420  LABPROT 15.2  INR 1.2   Hepatitis Panel No results for input(s): HEPBSAG, HCVAB, HEPAIGM, HEPBIGM in the last 72 hours.  Studies/Results: DG Chest Portable 1 View  Result Date: 06/30/2019 CLINICAL DATA:  Shortness of breath EXAM: PORTABLE CHEST 1 VIEW COMPARISON:  None. FINDINGS: Note shallow degree of inspiration. There is a rather minimal pleural effusion on each side. There is slight bibasilar atelectasis. The lungs elsewhere are clear. Heart size and pulmonary vascularity are normal. No adenopathy. No bone lesions.  IMPRESSION: Rather minimal pleural effusions bilaterally with bibasilar atelectasis. Lungs elsewhere clear. Cardiac silhouette normal. Electronically Signed   By: Lowella Grip III M.D.   On: 06/30/2019 13:23    ASSESMENT:   *   Decompensated cirrhosis w massive, recurrent ascites, despite increase to large doses of Lasix, Aldactone less than a week ago.  Has required for large-volume paracentesis over the course of 1 month. 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  Says last ETOH was 2 months ago. LFTs improved.  INR wnl.       *     Acute on chronic hyponatremia.  In setting of new, larger doses of diuretics and 10 L paracentesis 1 week ago. Na 120 this AM.   Tolvaptan day 1 of 4, per Dr Candiss Norse.       *   Covid 19 Positive  *    AKI.  Improved.   Received 4 doses IV albumin.    *   Hyperkalemia.     *     Celiac disease.  Compliant with gluten-free diet.   *   Anemia, MCV normal.   Hgb at 10.7 is c/w levels in January.    *  Thrombocytopenia at 149 K.     PLAN   *   Paracentesis portable this PM, w covid IR prefers to to portable when possible.   Give additional dose of albumin after tap.    *  Dr Justin Mend, nephrology has been called and will consult pt    Azucena Freed  07/01/2019, 9:01 AM Phone [phone]  Spoke to Dr. Candiss Norse today - he has an appropriate plan in place for paracentesis, midodrine, albumin and diuresis.  TIPS may be needed - await Echo report  Patient is awaiting [facility] hepatology evaluation - will probably not make that visit He has not been abstinent enough to be listed for a transplant but otherwise reasonable to eval depending upon how this goes but I think he is uninsured so that precludes it at this time  Appreciate Dr. Candiss Norse and Justin Mend - major issue now is fluid/vol management and hyponatremia and will defer to them  Gatha Mayer, MD, [facility] 07/01/2019 7:12 PM

## 2019-07-01 NOTE — Progress Notes (Addendum)
RN called to pt's bedside. Pt complaining of a burning/numbness in his R lateral thigh. Pt reports pain 10/10 in this area with associated nausea from pain. Dr. Candiss Norse came to bedside to assess. He will be ordering a CT scan and 34m morphine for pain. Will carry out orders and continue to monitor.  [id number] Pt reports pain/burning has now subsided. He reports slight numbness but no pain. CT canceled per Dr. SCandiss Norse will hold off on pain medication.

## 2019-07-01 NOTE — Progress Notes (Signed)
IR at bedside to complete paracentesis. RN present during entire procedure. Started at 1130, monitored BP every 5 minutes throughout. Per orders from Dr. Candiss Norse, 50g albumin infusing during procedure. LR bolus readily available if hypotension presents. Total of 10L removed. Pt stable throughout with no dizziness, light-headedness, or drowsiness. All vitals WDL throughout procedure. Completed at 1210. Will continue to monitor pt closely.

## 2019-07-01 NOTE — Progress Notes (Signed)
PROGRESS NOTE                                                                                                                                                                                                             Patient Demographics:    Shawn Martin, is a 44 y.o. male, DOB - 1975/11/15, DEY:[id number]  Admit date - 06/30/2019   Admitting Physician Mckinley Jewel, MD  Outpatient Primary MD for the patient is Patient, No Pcp Per  LOS - 1  Chief Complaint  Patient presents with   Shortness of Breath   Abdominal Pain    abdominal distension       Brief Narrative  Shawn Martin is a 44 y.o. male with medical history significant of alcoholic liver cirrhosis, celiac disease, hepatitis C, hypertension, esophageal varices, portal hypertensive gastropathy presents to emergency department with increasing abdominal bloating and shortness of breath, he was found to have massive recurrent ascites, apparently he has been requiring ultrasound-guided paracentesis for large amounts of fluid removal once or twice a week.  He is being followed by GI physician Dr. Havery Moros.  He was admitted for massive symptomatic ascites.   Subjective:    Shawn Martin today has, No headache, No chest pain, No abdominal pain - No Nausea, No new weakness tingling or numbness, No Cough - SOB.  Have transient right eye pain which resolved almost by itself within a few minutes.   Assessment  & Plan :     1.  Massive recurrent ascites causing shortness of breath and discomfort in a patient with history of alcoholic cirrhosis, some question of autoimmune liver injury as well.  IR has been consulted and he will undergo large-volume therapeutic paracentesis on 07/01/2019, will try and remove 8 to 10 L if his blood pressure permits, have premedicated him with midodrine, albumin will be given IV during the procedure.  If needed small amounts of IV fluids as well.  Diuretics on hold due to hypotension  and worsening creatinine.  Will monitor closely.  2.  Hyponatremia.  Significant due to #1 above.  Fluid removal and monitor.  Will give a dose of [place] on 07/01/2019 as well.  3.  Alcohol abuse.  Quit early January 2021.  Requested to continue to abstain.  4.  History of celiac disease.  Supportive care.  Gluten-free diet here.  5. Sudden and brief episode of excruciating right thigh pain.  Cause unclear, exam unremarkable except for mild swelling, no signs of compartment  syndrome, check venous duplex and monitor.  6. Incidental and Symptom free Covid Infection - monitor.  COVID-19 Labs  No results for input(s): DDIMER, FERRITIN, LDH, CRP in the last 72 hours.  Lab Results  Component Value Date   [place] (A) 06/30/2019   [place] Not Detected 04/11/2019   [place] Not Detected 03/07/2019   SARSCOV2NAA RESULT:  NEGATIVE 02/04/2019    Family Communication  : Called wife Crystal on [phone] - 07/01/19  Code Status :  Full  Disposition Plan  : Stay in the hospital for treatment of massive recurrent ascites due to underlying cirrhosis.  May require TIPS procedure.  Consults  : GI  Procedures  :    Echocardiogram pending.  Volume therapeutic ultrasound-guided paracentesis to be done    DVT Prophylaxis  :   SCDs    Lab Results  Component Value Date   PLT 149 (L) 07/01/2019   Lab Results  Component Value Date   INR 1.2 07/01/2019   INR 1.2 06/30/2019   INR 1.1 06/22/2019    Diet :  Diet Order            Diet gluten free Room service appropriate? Yes; Fluid consistency: Thin  Diet effective now               Inpatient Medications Scheduled Meds:  lidocaine       midodrine  10 mg Oral TID WC   multivitamin with minerals  1 tablet Oral Daily   tolvaptan  15 mg Oral Q24H   Continuous Infusions:  albumin human     PRN Meds:.acetaminophen **OR** [DISCONTINUED] acetaminophen, magnesium gluconate, [DISCONTINUED] ondansetron **OR**  ondansetron (ZOFRAN) IV  Antibiotics  :   Anti-infectives (From admission, onward)   Start     Dose/Rate Route Frequency Ordered Stop   06/30/19 1445  sulfamethoxazole-trimethoprim (BACTRIM DS) 800-160 MG per tablet 1 tablet  Status:  Discontinued     1 tablet Oral Daily 06/30/19 1437 06/30/19 1915          Objective:   Vitals:   07/01/19 0346 07/01/19 0400 07/01/19 0452 07/01/19 0824  BP: 130/76   122/78  Pulse: (!) 103 95  98  Resp: 18 16  20   Temp: 98.4 F (36.9 C)   97.7 F (36.5 C)  TempSrc: Oral   Axillary  SpO2: 95% 93%  96%  Weight:   110.1 kg   Height:   6' (1.829 m)     SpO2: 96 % O2 Flow Rate (L/min): 2 L/min  Wt Readings from Last 3 Encounters:  07/01/19 110.1 kg  06/22/19 105 kg  06/09/19 104.4 kg     Intake/Output Summary (Last 24 hours) at 07/01/2019 1110 Last data filed at 07/01/2019 9678 Gross per 24 hour  Intake 460 ml  Output --  Net 460 ml     Physical Exam  Awake Alert, No new F.N deficits, Normal affect [place].AT,PERRAL Supple Neck,No JVD, No cervical lymphadenopathy appriciated.  Symmetrical Chest wall movement, Good air movement bilaterally, CTAB RRR,No Gallops,Rubs or new Murmurs, No Parasternal Heave +ve B.Sounds, Abd is massively distended with ascites, also has leg edema right more than left but no signs of compartment syndrome, no redness or hematoma      Data Review:    Recent Labs  Lab 06/30/19 1151 07/01/19 0528  WBC 5.3 5.2  HGB 12.0* 10.7*  HCT 34.6* 30.0*  PLT 189 149*  MCV 90.8 90.9  MCH 31.5 32.4  MCHC 34.7 35.7  RDW  15.5 15.4  LYMPHSABS 1.1  --   MONOABS 0.8  --   EOSABS 0.2  --   BASOSABS 0.0  --     Recent Labs  Lab 06/24/19 1433 06/30/19 1151 06/30/19 1420 06/30/19 1432 07/01/19 0528 07/01/19 0818  NA 130* 121*  --   --  120*  --   K 4.5 5.2*  --   --  5.1  --   CL 97 88*  --   --  88*  --   CO2 30 20*  --   --  23  --   GLUCOSE 88 103*  --   --  92  --   BUN 15 20  --   --  21*  --    CREATININE 1.01 1.52*  --   --  1.31*  --   CALCIUM 8.1* 8.4*  --   --  8.4*  --   AST  --  82*  --   --  61*  --   ALT  --  43  --   --  34  --   ALKPHOS  --  99  --   --  83  --   BILITOT  --  2.5*  --   --  2.4*  --   ALBUMIN  --  2.1*  --   --  2.3*  --   MG  --  2.0  --   --   --   --   INR  --   --  1.2  --   --  1.2  TSH  --   --   --  3.126  --   --   AMMONIA  --   --   --   --   --  20    Recent Labs  Lab 06/30/19 1410  SARSCOV2NAA POSITIVE*    ------------------------------------------------------------------------------------------------------------------ No results for input(s): CHOL, HDL, LDLCALC, TRIG, CHOLHDL, LDLDIRECT in the last 72 hours.  No results found for: HGBA1C ------------------------------------------------------------------------------------------------------------------ Recent Labs    06/30/19 1432  TSH 3.126   ------------------------------------------------------------------------------------------------------------------ No results for input(s): VITAMINB12, FOLATE, FERRITIN, TIBC, IRON, RETICCTPCT in the last 72 hours.  Coagulation profile Recent Labs  Lab 06/30/19 1420 07/01/19 0818  INR 1.2 1.2    No results for input(s): DDIMER in the last 72 hours.  Cardiac Enzymes No results for input(s): CKMB, TROPONINI, MYOGLOBIN in the last 168 hours.  Invalid input(s): CK ------------------------------------------------------------------------------------------------------------------ No results found for: BNP  Micro Results Recent Results (from the past 240 hour(s))  Respiratory Panel by RT PCR (Flu A&B, Covid) - Nasopharyngeal Swab     Status: Abnormal   Collection Time: 06/30/19  2:10 PM   Specimen: Nasopharyngeal Swab  Result Value Ref Range Status   SARS Coronavirus 2 by RT PCR POSITIVE (A) NEGATIVE Final    Comment: RESULT CALLED TO, READ BACK BY AND VERIFIED WITH: Chalmers Cater RN 16:00 06/30/19 ([email or web address]) (NOTE) SARS-CoV-2 target  nucleic acids are DETECTED. SARS-CoV-2 RNA is generally detectable in upper respiratory specimens  during the acute phase of infection. Positive results are indicative of the presence of the identified virus, but do not rule out bacterial infection or co-infection with other pathogens not detected by the test. Clinical correlation with patient history and other diagnostic information is necessary to determine patient infection status. The expected result is Negative. Fact Sheet for Patients:  PinkCheek.be Fact Sheet for Healthcare Providers: GravelBags.it This test is not yet approved or cleared by the  [place] Architectural technologist and  has been authorized for detection and/or diagnosis of SARS-CoV-2 by FDA under an Print production planner (EUA).  This EUA will remain in effect (meaning this test can be used) f or the duration of  the COVID-19 declaration under Section 564(b)(1) of the Act, 21 U.S.C. section 360bbb-3(b)(1), unless the authorization is terminated or revoked sooner.    Influenza A by PCR NEGATIVE NEGATIVE Final   Influenza B by PCR NEGATIVE NEGATIVE Final    Comment: (NOTE) The Xpert Xpress SARS-CoV-2/FLU/RSV assay is intended as an aid in  the diagnosis of influenza from Nasopharyngeal swab specimens and  should not be used as a sole basis for treatment. Nasal washings and  aspirates are unacceptable for Xpert Xpress SARS-CoV-2/FLU/RSV  testing. Fact Sheet for Patients: PinkCheek.be Fact Sheet for Healthcare Providers: GravelBags.it This test is not yet approved or cleared by the [place] FDA and  has been authorized for detection and/or diagnosis of SARS-CoV-2 by  FDA under an Emergency Use Authorization (EUA). This EUA will remain  in effect (meaning this test can be used) for the duration of the  Covid-19 declaration under Section 564(b)(1) of the Act,  21  U.S.C. section 360bbb-3(b)(1), unless the authorization is  terminated or revoked. Performed at [facility], [place] [address] [place], [place] [address]     Radiology Reports IR Venogram Hepatic Wo Hemodynamic Evaluation  Result Date: 06/22/2019 CLINICAL DATA:  Presumed cirrhosis, recurrent large volume abdominal ascites. EXAM: HEPATIC VENOGRAM TRANSJUGULAR LIVER BIOPSY FLUOROSCOPY TIME:  7.2 minutes; 2441  uGym2 DAP PROCEDURE: The procedure, risks (including but not limited to bleeding, infection, organ damage ), benefits, and alternatives were explained to the patient. Questions regarding the procedure were encouraged and answered. The patient understands and consents to the procedure. Large volume paracentesis was initiated immediately preceding this procedure, dictated separately. Patency of the right IJ vein was confirmed with ultrasound with image documentation. An appropriate skin site was determined. Skin site was marked, prepped with chlorhexidine, and draped using maximum barrier technique. The region was infiltrated locally with 1% lidocaine. Intravenous Fentanyl 16mg and Versed 270mwere administered as conscious sedation during continuous monitoring of the patient's level of consciousness and physiological / cardiorespiratory status by the radiology RN, with a total moderate sedation time of 20 minutes. Under real-time ultrasound guidance, the right IJ vein was accessed with a 21 gauge micropuncture needle; the needle tip within the vein was confirmed with ultrasound image documentation. . Needle was exchanged over guidewire for transitional dilator, which allowed passage of a J wire into the IVC. Over this, a 5 FrPakistanngiographic catheter was advanced and used to selectively catheterize the right hepatic vein. Confirmatory venography was performed. A 9 French angled sheath was advanced into the right hepatic vein. The catheter was exchanged over the guidewire for the delivery  sheath of the transjugular core biopsy needle. Multiple 18-gauge core liver biopsy samples were obtained. The sheath was removed and hemostasis achieved at the site. Patient tolerated procedure well, with no immediate complication. IMPRESSION: 1. Right hepatic venogram confirms appropriate catheter position 2. Technically successful transjugular core liver biopsy . Electronically Signed   By: D Lucrezia Europe.D.   On: 06/22/2019 17:25   IR Transcatheter BX  Result Date: 06/22/2019 CLINICAL DATA:  Presumed cirrhosis, recurrent large volume abdominal ascites. EXAM: HEPATIC VENOGRAM TRANSJUGULAR LIVER BIOPSY FLUOROSCOPY TIME:  7.2 minutes; 2441  uGym2 DAP PROCEDURE: The procedure, risks (including but not limited to bleeding, infection, organ damage ),  benefits, and alternatives were explained to the patient. Questions regarding the procedure were encouraged and answered. The patient understands and consents to the procedure. Large volume paracentesis was initiated immediately preceding this procedure, dictated separately. Patency of the right IJ vein was confirmed with ultrasound with image documentation. An appropriate skin site was determined. Skin site was marked, prepped with chlorhexidine, and draped using maximum barrier technique. The region was infiltrated locally with 1% lidocaine. Intravenous Fentanyl 11mg and Versed 257mwere administered as conscious sedation during continuous monitoring of the patient's level of consciousness and physiological / cardiorespiratory status by the radiology RN, with a total moderate sedation time of 20 minutes. Under real-time ultrasound guidance, the right IJ vein was accessed with a 21 gauge micropuncture needle; the needle tip within the vein was confirmed with ultrasound image documentation. . Needle was exchanged over guidewire for transitional dilator, which allowed passage of a J wire into the IVC. Over this, a 5 FrPakistanngiographic catheter was advanced and used to  selectively catheterize the right hepatic vein. Confirmatory venography was performed. A 9 French angled sheath was advanced into the right hepatic vein. The catheter was exchanged over the guidewire for the delivery sheath of the transjugular core biopsy needle. Multiple 18-gauge core liver biopsy samples were obtained. The sheath was removed and hemostasis achieved at the site. Patient tolerated procedure well, with no immediate complication. IMPRESSION: 1. Right hepatic venogram confirms appropriate catheter position 2. Technically successful transjugular core liver biopsy . Electronically Signed   By: D Lucrezia Europe.D.   On: 06/22/2019 17:25   US[place]acentesis  Result Date: 06/04/2019 INDICATION: Patient with history of cirrhosis, recurrent ascites. Request is made for diagnostic and therapeutic paracentesis. EXAM: ULTRASOUND GUIDED DIAGNOSTIC AND THERAPEUTIC PARACENTESIS MEDICATIONS: 10 mL 1% lidocaine COMPLICATIONS: None immediate. PROCEDURE: Informed written consent was obtained from the patient after a discussion of the risks, benefits and alternatives to treatment. A timeout was performed prior to the initiation of the procedure. Initial ultrasound scanning demonstrates a large amount of ascites within the left lateral abdomen. The left lateral abdomen was prepped and draped in the usual sterile fashion. 1% lidocaine was used for local anesthesia. Following this, a 19 gauge, 10-cm, Yueh catheter was introduced. An ultrasound image was saved for documentation purposes. The paracentesis was performed. The catheter was removed and a dressing was applied. The patient tolerated the procedure well without immediate post procedural complication. FINDINGS: A total of approximately 5.0 liters of yellow fluid was removed. Samples were sent to the laboratory as requested by the clinical team. IMPRESSION: Successful ultrasound-guided paracentesis yielding 5.0 liters of peritoneal fluid. Read by: KaBrynda GreathouseA-C  Electronically Signed   By: JoSandi Mariscal.D.   On: 06/04/2019 15:07   IR US[place]oreauide Vasc Access Right  Result Date: 06/22/2019 CLINICAL DATA:  Presumed cirrhosis, recurrent large volume abdominal ascites. EXAM: HEPATIC VENOGRAM TRANSJUGULAR LIVER BIOPSY FLUOROSCOPY TIME:  7.2 minutes; 2441  uGym2 DAP PROCEDURE: The procedure, risks (including but not limited to bleeding, infection, organ damage ), benefits, and alternatives were explained to the patient. Questions regarding the procedure were encouraged and answered. The patient understands and consents to the procedure. Large volume paracentesis was initiated immediately preceding this procedure, dictated separately. Patency of the right IJ vein was confirmed with ultrasound with image documentation. An appropriate skin site was determined. Skin site was marked, prepped with chlorhexidine, and draped using maximum barrier technique. The region was infiltrated locally with 1% lidocaine. Intravenous Fentanyl 10022mand Versed  48m were administered as conscious sedation during continuous monitoring of the patient's level of consciousness and physiological / cardiorespiratory status by the radiology RN, with a total moderate sedation time of 20 minutes. Under real-time ultrasound guidance, the right IJ vein was accessed with a 21 gauge micropuncture needle; the needle tip within the vein was confirmed with ultrasound image documentation. . Needle was exchanged over guidewire for transitional dilator, which allowed passage of a J wire into the IVC. Over this, a 5 FPak[place]angiographic catheter was advanced and used to selectively catheterize the right hepatic vein. Confirmatory venography was performed. A 9 French angled sheath was advanced into the right hepatic vein. The catheter was exchanged over the guidewire for the delivery sheath of the transjugular core biopsy needle. Multiple 18-gauge core liver biopsy samples were obtained. The sheath was removed and  hemostasis achieved at the site. Patient tolerated procedure well, with no immediate complication. IMPRESSION: 1. Right hepatic venogram confirms appropriate catheter position 2. Technically successful transjugular core liver biopsy . Electronically Signed   By: DLucrezia EuropeM.D.   On: 06/22/2019 17:25   DG Chest Portable 1 View  Result Date: 06/30/2019 CLINICAL DATA:  Shortness of breath EXAM: PORTABLE CHEST 1 VIEW COMPARISON:  None. FINDINGS: Note shallow degree of inspiration. There is a rather minimal pleural effusion on each side. There is slight bibasilar atelectasis. The lungs elsewhere are clear. Heart size and pulmonary vascularity are normal. No adenopathy. No bone lesions. IMPRESSION: Rather minimal pleural effusions bilaterally with bibasilar atelectasis. Lungs elsewhere clear. Cardiac silhouette normal. Electronically Signed   By: WLowella GripIII M.D.   On: 06/30/2019 13:23   IR Paracentesis  Result Date: 06/23/2019 INDICATION: Recurrent large volume symptomatic abdominal ascites EXAM: ULTRASOUND GUIDED  PARACENTESIS MEDICATIONS: None. COMPLICATIONS: None immediate. PROCEDURE: Informed written consent was obtained from the patient after a discussion of the risks, benefits and alternatives to treatment. A timeout was performed prior to the initiation of the procedure. Initial ultrasound scanning demonstrates a large amount of ascites within the right lower abdominal quadrant. The right lower abdomen was prepped and draped in the usual sterile fashion. 1% lidocaine was used for local anesthesia. Following this, a 6 Fr Safe-T-Centesis catheter was introduced. An ultrasound image was saved for documentation purposes. The paracentesis was performed. The catheter was removed and a dressing was applied. The patient tolerated the procedure well without immediate post procedural complication. Patient received post-procedure intravenous albumin; see nursing notes for details. FINDINGS: A total of  approximately 10 L of   clear yellow fluid was removed. IMPRESSION: Successful ultrasound-guided paracentesis yielding 10 liters of peritoneal fluid. Electronically Signed   By: DLucrezia EuropeM.D.   On: 06/23/2019 08:10   IR Paracentesis  Result Date: 06/15/2019 INDICATION: Patient with history of cirrhosis, recurrent ascites. Request to IR for therapeutic paracentesis with 5 L maximum. EXAM: ULTRASOUND GUIDED THERAPEUTIC PARACENTESIS MEDICATIONS: 10 mL 1% lidocaine COMPLICATIONS: None immediate. PROCEDURE: Informed written consent was obtained from the patient after a discussion of the risks, benefits and alternatives to treatment. A timeout was performed prior to the initiation of the procedure. Initial ultrasound scanning demonstrates a large amount of ascites within the right lower abdominal quadrant. The right lower abdomen was prepped and draped in the usual sterile fashion. 1% lidocaine was used for local anesthesia. Following this, a 19 gauge, 7-cm, Yueh catheter was introduced. An ultrasound image was saved for documentation purposes. The paracentesis was performed. The catheter was removed and a dressing was applied.  The patient tolerated the procedure well without immediate post procedural complication. FINDINGS: A total of approximately 5.0 L of hazy pale yellow fluid was removed. IMPRESSION: Successful ultrasound-guided paracentesis yielding 5.0 liters of peritoneal fluid. Read by Candiss Norse, PA-C Electronically Signed   By: Markus Daft M.D.   On: 06/15/2019 14:56   IR Paracentesis  Result Date: 06/10/2019 INDICATION: Patient with history of cirrhosis, recurrent ascites. Request is made for diagnostic and therapeutic paracentesis up to 5 L maximum. EXAM: ULTRASOUND GUIDED DIAGNOSTIC AND THERAPEUTIC PARACENTESIS MEDICATIONS: 10 mL 1% lidocaine COMPLICATIONS: None immediate. PROCEDURE: Informed written consent was obtained from the patient after a discussion of the risks, benefits and  alternatives to treatment. A timeout was performed prior to the initiation of the procedure. Initial ultrasound scanning demonstrates a large amount of ascites within the left lateral abdomen. The left lateral abdomen was prepped and draped in the usual sterile fashion. 1% lidocaine was used for local anesthesia. Following this, a 19 gauge, 7-cm, Yueh catheter was introduced. An ultrasound image was saved for documentation purposes. The paracentesis was performed. The catheter was removed and a dressing was applied. The patient tolerated the procedure well without immediate post procedural complication. FINDINGS: A total of approximately 5.0 liters of yellow fluid was removed. Samples were sent to the laboratory as requested by the clinical team. IMPRESSION: Successful ultrasound-guided diagnostic and therapeutic paracentesis yielding 5.0 liters of peritoneal fluid. Read by: Brynda Greathouse PA-C Electronically Signed   By: Jerilynn Mages.  Shick M.D.   On: 06/10/2019 12:17    Time Spent in minutes  30   Lala Lund M.D on 07/01/2019 at 11:10 AM  To page go to [email or web address] - password [facility]

## 2019-07-01 NOTE — Procedures (Signed)
PROCEDURE SUMMARY:  Successful image-guided paracentesis from the right lower abdomen.  Yielded 10.0 liters of hazy yellow fluid which was the requested maximum.  No immediate complications.  EBL: zero Patient tolerated well.   Specimen was sent for labs.  Please see imaging section of Epic for full dictation.  Joaquim Nam PA-C 07/01/2019 11:37 AM

## 2019-07-02 LAB — CBC WITH DIFFERENTIAL/PLATELET
Abs Immature Granulocytes: 0.01 10*3/uL (ref 0.00–0.07)
Basophils Absolute: 0 10*3/uL (ref 0.0–0.1)
Basophils Relative: 1 %
Eosinophils Absolute: 0.1 10*3/uL (ref 0.0–0.5)
Eosinophils Relative: 2 %
HCT: 30.2 % — ABNORMAL LOW (ref 39.0–52.0)
Hemoglobin: 10.5 g/dL — ABNORMAL LOW (ref 13.0–17.0)
Immature Granulocytes: 0 %
Lymphocytes Relative: 28 %
Lymphs Abs: 1.2 10*3/uL (ref 0.7–4.0)
MCH: 31.8 pg (ref 26.0–34.0)
MCHC: 34.8 g/dL (ref 30.0–36.0)
MCV: 91.5 fL (ref 80.0–100.0)
Monocytes Absolute: 0.6 10*3/uL (ref 0.1–1.0)
Monocytes Relative: 16 %
Neutro Abs: 2.2 10*3/uL (ref 1.7–7.7)
Neutrophils Relative %: 53 %
Platelets: 134 10*3/uL — ABNORMAL LOW (ref 150–400)
RBC: 3.3 MIL/uL — ABNORMAL LOW (ref 4.22–5.81)
RDW: 15.5 % (ref 11.5–15.5)
WBC: 4.1 10*3/uL (ref 4.0–10.5)
nRBC: 0 % (ref 0.0–0.2)

## 2019-07-02 LAB — COMPREHENSIVE METABOLIC PANEL
ALT: 29 U/L (ref 0–44)
AST: 51 U/L — ABNORMAL HIGH (ref 15–41)
Albumin: 2.7 g/dL — ABNORMAL LOW (ref 3.5–5.0)
Alkaline Phosphatase: 73 U/L (ref 38–126)
Anion gap: 8 (ref 5–15)
BUN: 17 mg/dL (ref 6–20)
CO2: 27 mmol/L (ref 22–32)
Calcium: 8.9 mg/dL (ref 8.9–10.3)
Chloride: 99 mmol/L (ref 98–111)
Creatinine, Ser: 1.24 mg/dL (ref 0.61–1.24)
GFR calc Af Amer: >60 mL/min (ref >60)
GFR calc non Af Amer: >60 mL/min (ref >60)
Glucose, Bld: 101 mg/dL — ABNORMAL HIGH (ref 70–99)
Potassium: 4.7 mmol/L (ref 3.5–5.1)
Sodium: 134 mmol/L — ABNORMAL LOW (ref 135–145)
Total Bilirubin: 1.9 mg/dL — ABNORMAL HIGH (ref 0.3–1.2)
Total Protein: 5.7 g/dL — ABNORMAL LOW (ref 6.5–8.1)

## 2019-07-02 LAB — BASIC METABOLIC PANEL
Anion gap: 9 (ref 5–15)
BUN: 14 mg/dL (ref 6–20)
CO2: 25 mmol/L (ref 22–32)
Calcium: 8.6 mg/dL — ABNORMAL LOW (ref 8.9–10.3)
Chloride: 96 mmol/L — ABNORMAL LOW (ref 98–111)
Creatinine, Ser: 1.07 mg/dL (ref 0.61–1.24)
GFR calc Af Amer: >60 mL/min (ref >60)
GFR calc non Af Amer: >60 mL/min (ref >60)
Glucose, Bld: 107 mg/dL — ABNORMAL HIGH (ref 70–99)
Potassium: 3.9 mmol/L (ref 3.5–5.1)
Sodium: 130 mmol/L — ABNORMAL LOW (ref 135–145)

## 2019-07-02 LAB — D-DIMER, QUANTITATIVE: D-Dimer, Quant: 6.8 ug/mL-FEU — ABNORMAL HIGH (ref 0.00–0.50)

## 2019-07-02 LAB — RENAL FUNCTION PANEL
Albumin: 2.7 g/dL — ABNORMAL LOW (ref 3.5–5.0)
Anion gap: 8 (ref 5–15)
BUN: 17 mg/dL (ref 6–20)
CO2: 27 mmol/L (ref 22–32)
Calcium: 9 mg/dL (ref 8.9–10.3)
Chloride: 99 mmol/L (ref 98–111)
Creatinine, Ser: 1.24 mg/dL (ref 0.61–1.24)
GFR calc Af Amer: >60 mL/min (ref >60)
GFR calc non Af Amer: >60 mL/min (ref >60)
Glucose, Bld: 100 mg/dL — ABNORMAL HIGH (ref 70–99)
Phosphorus: 4.2 mg/dL (ref 2.5–4.6)
Potassium: 4.7 mmol/L (ref 3.5–5.1)
Sodium: 134 mmol/L — ABNORMAL LOW (ref 135–145)

## 2019-07-02 LAB — BRAIN NATRIURETIC PEPTIDE: B Natriuretic Peptide: 467 pg/mL — ABNORMAL HIGH (ref 0.0–100.0)

## 2019-07-02 LAB — PROTIME-INR
INR: 1.2 (ref 0.8–1.2)
Prothrombin Time: 15.1 seconds (ref 11.4–15.2)

## 2019-07-02 LAB — MAGNESIUM: Magnesium: 2.2 mg/dL (ref 1.7–2.4)

## 2019-07-02 LAB — C-REACTIVE PROTEIN: CRP: 0.9 mg/dL (ref <1.0)

## 2019-07-02 LAB — AMMONIA: Ammonia: 9 umol/L (ref 9–35)

## 2019-07-02 MED ORDER — DEXTROSE 5 % IV SOLN
INTRAVENOUS | Status: AC
  Administered 2019-07-02: 125 mL/h via INTRAVENOUS

## 2019-07-02 MED ORDER — PNEUMOCOCCAL VAC POLYVALENT 25 MCG/0.5ML IJ INJ
0.5000 mL | INJECTION | INTRAMUSCULAR | Status: AC
  Administered 2019-07-03: 0.5 mL via INTRAMUSCULAR
  Filled 2019-07-02: qty 0.5

## 2019-07-02 MED ORDER — FUROSEMIDE 10 MG/ML IJ SOLN
80.0000 mg | Freq: Two times a day (BID) | INTRAMUSCULAR | Status: DC
  Filled 2019-07-02: qty 8

## 2019-07-02 MED ORDER — FERROUS SULFATE 325 (65 FE) MG PO TABS
325.0000 mg | ORAL_TABLET | Freq: Every day | ORAL | Status: DC
  Administered 2019-07-02 – 2019-07-04 (×3): 325 mg via ORAL
  Filled 2019-07-02 (×3): qty 1

## 2019-07-02 NOTE — Evaluation (Signed)
Physical Therapy Evaluation Patient Details Name: Shawn Martin MRN: [id number] DOB: 03-28-1976 Today's Date: 07/02/2019   History of Present Illness  Pt adm with massive symptomatic ascites due to alcoholic liver cirrhosis. Pt found to be covid positive. Pt underwent paracentesis with removal of 10L on 4/2. PMH - alcoholic liver disease, celiac disease, hep C, HTN, osteoporosis.   Clinical Impression  Pt doing well with mobility and no further PT needed.  Ready for dc from PT standpoint.      Follow Up Recommendations No PT follow up    Equipment Recommendations  None recommended by PT    Recommendations for Other Services       Precautions / Restrictions Precautions Precautions: None      Mobility  Bed Mobility Overal bed mobility: Independent                Transfers Overall transfer level: Independent                  Ambulation/Gait Ambulation/Gait assistance: Independent Gait Distance (Feet): 500 Feet Assistive device: IV Pole Gait Pattern/deviations: WFL(Within Functional Limits) Gait velocity: normal Gait velocity interpretation: >4.37 ft/sec, indicative of normal walking speed General Gait Details: Steady gait  Stairs            Wheelchair Mobility    Modified Rankin (Stroke Patients Only)       Balance Overall balance assessment: No apparent balance deficits (not formally assessed)                                           Pertinent Vitals/Pain Pain Assessment: No/denies pain    Home Living Family/patient expects to be discharged to:: Private residence Living Arrangements: Spouse/significant other Available Help at Discharge: Family;Available PRN/intermittently         Home Layout: One level Home Equipment: None      Prior Function Level of Independence: Independent               Hand Dominance        Extremity/Trunk Assessment   Upper Extremity Assessment Upper Extremity  Assessment: Overall WFL for tasks assessed    Lower Extremity Assessment Lower Extremity Assessment: Overall WFL for tasks assessed       Communication   Communication: No difficulties  Cognition Arousal/Alertness: Awake/alert Behavior During Therapy: WFL for tasks assessed/performed Overall Cognitive Status: Within Functional Limits for tasks assessed                                        General Comments General comments (skin integrity, edema, etc.): SpO2 99% on RA with activity    Exercises     Assessment/Plan    PT Assessment Patent does not need any further PT services  PT Problem List         PT Treatment Interventions      PT Goals (Current goals can be found in the Care Plan section)  Acute Rehab PT Goals PT Goal Formulation: All assessment and education complete, DC therapy    Frequency     Barriers to discharge        Co-evaluation               AM-PAC PT "6 Clicks" Mobility  Outcome Measure Help needed turning from your back  to your side while in a flat bed without using bedrails?: None Help needed moving from lying on your back to sitting on the side of a flat bed without using bedrails?: None Help needed moving to and from a bed to a chair (including a wheelchair)?: None Help needed standing up from a chair using your arms (e.g., wheelchair or bedside chair)?: None Help needed to walk in hospital room?: None Help needed climbing 3-5 steps with a railing? : None 6 Click Score: 24    End of Session   Activity Tolerance: Patient tolerated treatment well Patient left: in bed;with call bell/phone within reach   PT Visit Diagnosis: Other abnormalities of gait and mobility (R26.89)    Time: 5573-2202 PT Time Calculation (min) (ACUTE ONLY): 16 min   Charges:   PT Evaluation $PT Eval Low Complexity: [place] Pager [phone] Office [place] 07/02/2019, 1:15 PM

## 2019-07-02 NOTE — Progress Notes (Signed)
[facility] ROUNDING NOTE   Subjective:   Shawn Martin is a 44 y.o. male.  With a history of alcoholic liver disease.  Quit January 2021.  Also has a history of celiac disease, hepatitis C, hypertension, osteoporosis, portal hypertension and gastropathy presented to the emergency increasing abdominal bloating and shortness of breath.  Was found to have massive accumulation of recurrent ascites.  He is followed by a GI physician Dr. Havery Moros  He underwent large-volume paracentesis 07/01/2019 with removal of 10 L of hazy yellow fluid  Blood pressure 123/86 pulse 92 temperature 98.1 O2 sats 96% room air.  Urine output 2000 cc  Sodium 134 potassium 4.7 chloride 99 CO2 27 BUN 17 creatinine 1.2 glucose 100 calcium 8.9 phosphorus 4.2 magnesium 2.2 albumin 2.7 AST 51 ALT 29 total bilirubin 1.9  Midodrine 10 mg 3 times daily, multivitamins 1 daily, albumin 25% every 6 hours   Urine osmolality 368 urine sodium 75 urinalysis 0 red blood cells 0 white blood cells negative for protein  Last abdominal ultrasound was performed on 02/07/2019 cirrhosis portal vein with hepatopetal flow ascites no gallstones right pleural effusion   Objective:  Vital signs in last 24 hours:  Temp:  [97.5 F (36.4 C)-98.1 F (36.7 C)] 98.1 F (36.7 C) (04/03 0630) Pulse Rate:  [92-104] 92 (04/03 0630) Resp:  [14-24] 23 (04/03 0630) BP: (98-130)/(60-86) 123/86 (04/03 0630) SpO2:  [94 %-99 %] 96 % (04/03 0630) Weight:  [95 kg] 95 kg (04/03 0500)  Weight change: -15.1 kg Filed Weights   07/01/19 0452 07/02/19 0500  Weight: 110.1 kg 95 kg    Intake/Output: I/O last 3 completed shifts: In: 1860 [P.O.:1560; IV Piggyback:300] Out: 2030 [Urine:2030]   Intake/Output this shift:  No intake/output data recorded.  General: Nondistressed, communicating well HEENT: Normocephalic atraumatic Eyes: Extraocular movements intact Neck: Supple no JVP no thyromegaly or masses Heart: Regular rate  and rhythm no murmurs rubs or gallops Lungs: Clear to auscultation Abdomen: Distended tympanic Extremities: 2+ pitting edema 2+ pedal pulses Skin: No rashes or lesions no ulcers Neuro: Intact judgment nonfocal   Basic Metabolic Panel: Recent Labs  Lab 06/30/19 1151 07/01/19 0528 07/02/19 0314  NA 121* 120* 134*  134*  K 5.2* 5.1 4.7  4.7  CL 88* 88* 99  99  CO2 20* 23 27  27   GLUCOSE 103* 92 100*  101*  BUN 20 21* 17  17  CREATININE 1.52* 1.31* 1.24  1.24  CALCIUM 8.4* 8.4* 9.0  8.9  MG 2.0  --  2.2  PHOS  --   --  4.2    Liver Function Tests: Recent Labs  Lab 06/30/19 1151 07/01/19 0528 07/02/19 0314  AST 82* 61* 51*  ALT 43 34 29  ALKPHOS 99 83 73  BILITOT 2.5* 2.4* 1.9*  PROT 6.6 6.0* 5.7*  ALBUMIN 2.1* 2.3* 2.7*  2.7*   Recent Labs  Lab 06/30/19 1151  LIPASE 43   Recent Labs  Lab 07/01/19 0818 07/02/19 0314  AMMONIA 20 9    CBC: Recent Labs  Lab 06/30/19 1151 07/01/19 0528 07/02/19 0314  WBC 5.3 5.2 4.1  NEUTROABS 3.2  --  2.2  HGB 12.0* 10.7* 10.5*  HCT 34.6* 30.0* 30.2*  MCV 90.8 90.9 91.5  PLT 189 149* 134*    Cardiac Enzymes: No results for input(s): CKTOTAL, CKMB, CKMBINDEX, TROPONINI in the last 168 hours.  BNP: Invalid input(s): POCBNP  CBG: No results for input(s): GLUCAP in the last 168 hours.  Microbiology: Results for orders placed or performed during the hospital encounter of 06/30/19  Respiratory Panel by RT PCR (Flu A&B, Covid) - Nasopharyngeal Swab     Status: Abnormal   Collection Time: 06/30/19  2:10 PM   Specimen: Nasopharyngeal Swab  Result Value Ref Range Status   SARS Coronavirus 2 by RT PCR POSITIVE (A) NEGATIVE Final    Comment: RESULT CALLED TO, READ BACK BY AND VERIFIED WITH: Chalmers Cater RN 16:00 06/30/19 ([email or web address]) (NOTE) SARS-CoV-2 target nucleic acids are DETECTED. SARS-CoV-2 RNA is generally detectable in upper respiratory specimens  during the acute phase of infection. Positive results are  indicative of the presence of the identified virus, but do not rule out bacterial infection or co-infection with other pathogens not detected by the test. Clinical correlation with patient history and other diagnostic information is necessary to determine patient infection status. The expected result is Negative. Fact Sheet for Patients:  PinkCheek.be Fact Sheet for Healthcare Providers: GravelBags.it This test is not yet approved or cleared by the [place] FDA and  has been authorized for detection and/or diagnosis of SARS-CoV-2 by FDA under an Emergency Use Authorization (EUA).  This EUA will remain in effect (meaning this test can be used) f or the duration of  the COVID-19 declaration under Section 564(b)(1) of the Act, 21 U.S.C. section 360bbb-3(b)(1), unless the authorization is terminated or revoked sooner.    Influenza A by PCR NEGATIVE NEGATIVE Final   Influenza B by PCR NEGATIVE NEGATIVE Final    Comment: (NOTE) The Xpert Xpress SARS-CoV-2/FLU/RSV assay is intended as an aid in  the diagnosis of influenza from Nasopharyngeal swab specimens and  should not be used as a sole basis for treatment. Nasal washings and  aspirates are unacceptable for Xpert Xpress SARS-CoV-2/FLU/RSV  testing. Fact Sheet for Patients: PinkCheek.be Fact Sheet for Healthcare Providers: GravelBags.it This test is not yet approved or cleared by the [place] FDA and  has been authorized for detection and/or diagnosis of SARS-CoV-2 by  FDA under an Emergency Use Authorization (EUA). This EUA will remain  in effect (meaning this test can be used) for the duration of the  Covid-19 declaration under Section 564(b)(1) of the Act, 21  U.S.C. section 360bbb-3(b)(1), unless the authorization is  terminated or revoked. Performed at [facility], [place] [address] [place],  [place] [address]     Coagulation Studies: Recent Labs    06/30/19 1420 07/01/19 0818 07/02/19 0314  LABPROT 15.2 15.3* 15.1  INR 1.2 1.2 1.2    Urinalysis: Recent Labs    06/30/19 1420 07/01/19 1224  COLORURINE YELLOW YELLOW  LABSPEC 1.009 1.019  PHURINE 6.0 6.0  GLUCOSEU NEGATIVE NEGATIVE  HGBUR NEGATIVE NEGATIVE  BILIRUBINUR NEGATIVE NEGATIVE  KETONESUR NEGATIVE 5*  PROTEINUR NEGATIVE NEGATIVE  NITRITE NEGATIVE NEGATIVE  LEUKOCYTESUR NEGATIVE NEGATIVE      Imaging: DG Chest Portable 1 View  Result Date: 06/30/2019 CLINICAL DATA:  Shortness of breath EXAM: PORTABLE CHEST 1 VIEW COMPARISON:  None. FINDINGS: Note shallow degree of inspiration. There is a rather minimal pleural effusion on each side. There is slight bibasilar atelectasis. The lungs elsewhere are clear. Heart size and pulmonary vascularity are normal. No adenopathy. No bone lesions. IMPRESSION: Rather minimal pleural effusions bilaterally with bibasilar atelectasis. Lungs elsewhere clear. Cardiac silhouette normal. Electronically Signed   By: Lowella Grip III M.D.   On: 06/30/2019 13:23   VAS [place] LOWER EXTREMITY VENOUS (DVT)  Result Date: 07/01/2019  Lower Venous DVTStudy Indications: Swelling.  Comparison Study: No prior exam. Performing Technologist: Baldwin Crown ARDMS, RVT  Examination Guidelines: A complete evaluation includes B-mode imaging, spectral Doppler, color Doppler, and power Doppler as needed of all accessible portions of each vessel. Bilateral testing is considered an integral part of a complete examination. Limited examinations for reoccurring indications may be performed as noted. The reflux portion of the exam is performed with the patient in reverse Trendelenburg.  +---------+---------------+---------+-----------+----------+--------------+ RIGHT    CompressibilityPhasicitySpontaneityPropertiesThrombus Aging +---------+---------------+---------+-----------+----------+--------------+ CFV       Full           Yes      Yes                                 +---------+---------------+---------+-----------+----------+--------------+ SFJ      Full                                                        +---------+---------------+---------+-----------+----------+--------------+ FV Prox  Full                                                        +---------+---------------+---------+-----------+----------+--------------+ FV Mid   Full                                                        +---------+---------------+---------+-----------+----------+--------------+ FV DistalFull                                                        +---------+---------------+---------+-----------+----------+--------------+ PFV      Full                                                        +---------+---------------+---------+-----------+----------+--------------+ POP      Full           Yes      Yes                                 +---------+---------------+---------+-----------+----------+--------------+ PTV      Full                                                        +---------+---------------+---------+-----------+----------+--------------+ PERO     Full                                                        +---------+---------------+---------+-----------+----------+--------------+   +---------+---------------+---------+-----------+----------+--------------+  LEFT     CompressibilityPhasicitySpontaneityPropertiesThrombus Aging +---------+---------------+---------+-----------+----------+--------------+ CFV      Full           Yes      Yes                                 +---------+---------------+---------+-----------+----------+--------------+ SFJ      Full                                                        +---------+---------------+---------+-----------+----------+--------------+ FV Prox  Full                                                         +---------+---------------+---------+-----------+----------+--------------+ FV Mid   Full                                                        +---------+---------------+---------+-----------+----------+--------------+ FV DistalFull                                                        +---------+---------------+---------+-----------+----------+--------------+ PFV      Full                                                        +---------+---------------+---------+-----------+----------+--------------+ POP      Full           Yes      Yes                                 +---------+---------------+---------+-----------+----------+--------------+ PTV      Full                                                        +---------+---------------+---------+-----------+----------+--------------+ PERO     Full                                                        +---------+---------------+---------+-----------+----------+--------------+     Summary: BILATERAL: - No evidence of deep vein thrombosis seen in the lower extremities, bilaterally.  RIGHT: - No cystic structure found in the popliteal fossa.  LEFT: - No cystic structure found in the popliteal fossa.  *See table(s) above for measurements  and observations.    Preliminary    IR Paracentesis  Result Date: 07/01/2019 INDICATION: Patient with history of cirrhosis, recurrent ascites requiring frequent large volume paracentesis. Request to IR for diagnostic and therapeutic paracentesis with 10 L maximum. EXAM: ULTRASOUND GUIDED DIAGNOSTIC AND THERAPEUTIC PARACENTESIS MEDICATIONS: 10 mL 1% lidocaine COMPLICATIONS: None immediate. PROCEDURE: Informed written consent was obtained from the patient after a discussion of the risks, benefits and alternatives to treatment. A timeout was performed prior to the initiation of the procedure. Initial ultrasound scanning demonstrates a large amount of ascites within the  right lower abdominal quadrant. The right lower abdomen was prepped and draped in the usual sterile fashion. 1% lidocaine was used for local anesthesia. Following this, a 19 gauge, 7-cm, Yueh catheter was introduced. An ultrasound image was saved for documentation purposes. The paracentesis was performed. The catheter was removed and a dressing was applied. The patient tolerated the procedure well without immediate post procedural complication. Patient received post-procedure intravenous albumin; see nursing notes for details. FINDINGS: A total of approximately 10.0 L of hazy yellow fluid was removed. Samples were sent to the laboratory as requested by the clinical team. IMPRESSION: Successful ultrasound-guided paracentesis yielding 10.0 liters of peritoneal fluid. Read by Candiss Norse, PA-C Electronically Signed   By: Jerilynn Mages.  Shick M.D.   On: 07/01/2019 13:27     Medications:   . dextrose     . [START ON 07/03/2019] furosemide  80 mg Intravenous BID  . midodrine  10 mg Oral TID WC  . multivitamin with minerals  1 tablet Oral Daily   acetaminophen **OR** [DISCONTINUED] acetaminophen, lidocaine, magnesium gluconate, [DISCONTINUED] ondansetron **OR** ondansetron (ZOFRAN) IV  Assessment/ Plan:   1.Acute kidney injury.  This appears to be in the setting of decompensated liver disease.  Urine sediment is bland renal ultrasound in November did not reveal any evidence of hydronephrosis.  He scheduled for thoracentesis later today.  He has massive tense ascites and is a possibility that this could represent abdominal compartment syndrome.  The treatment of that would be drainage of his ascites ascitic fluid.  I do not think there is any need to measure bladder pressures.  They do not appear to be the administration of any nephrotoxic agents he is at no CT scan with contrast, no use of nonsteroidal anti-inflammatory drugs, no Cox 2 inhibitors, no ACE inhibitor is no ARB's.  He is clearly hypervolemic.  I  think it reasonable to obtain another renal ultrasound for completion.  It is conceivable that this could represent acute tubular necrosis.    Renal function is improved.  This would diet that this would be compatible with hepatorenal syndrome.  I would expect progression of his renal disease. 2.  Hyponatremia.  Urine osmolality is high and urine sodium is elevated this would point to appropriate secretion of antidiuretic hormone.  Samsca administered with prompt diuretic response and improvement of serum sodium. 3.  Cirrhosis and decompensated liver disease.  The question will be whether the patient is needing to be seen by hepatology at [facility].  I will defer this to gastroenterology. 4.  Hypertension/volume appears to be volume overloaded would benefit from IV diuresis will start Lasix 80 mg every 12 hours.  Brisk diuresis with 2 L of urine output.  Lasix discontinued 5.  Anemia check iron studies.  And transfuse to maintain hemoglobin above 8 g/dL.  T sats 18%.  Will give oral iron supplementation.  It appears that the acute renal failure has resolved.  I  doubt this represents hepatorenal syndrome.  Ascites has been removed with large volume paracentesis.  There could have been an element of compartment syndrome.  Anyhow I am not sure there is much that nephrology can offer.  Sodium has improved with Samsca will sign off patient please reconsult if needed.      LOS: [place] @TODAY @8 :13 AM

## 2019-07-02 NOTE — Progress Notes (Signed)
   Patient Name: Shawn Martin Date of Encounter: 07/02/2019, 6:30 PM    Subjective  "I feel a lot better" 10 L paracentesis yestdreday Na up and creat NL  Objective  BP 114/69 (BP Location: Left Arm)   Pulse 91   Temp 97.8 F (36.6 C) (Oral)   Resp 20   Ht 6' (1.829 m)   Wt 95 kg   SpO2 100%   BMI 28.40 kg/m  Lab Results  Component Value Date   CREATININE 1.07 07/02/2019   BUN 14 07/02/2019   NA 130 (L) 07/02/2019   K 3.9 07/02/2019   CL 96 (L) 07/02/2019   CO2 25 07/02/2019    No SBP on paracentesis  Large 2L diuresisis w/ IV furosemide  Assessment and Plan  Cirrhosis with marked ascites Hyponatremia AKI  Much improved - may have had a compartment synderome causing AKI - makes sense  Possibly home tomorrow   Consider TIPS - Echo results pending BNP is up ? Sig - w/ alb < 1 on fluid this appears to be portal htn and not RHF but need to know Ef ok for possible TIPS  Will have to see what Na is tomorrow - he may not actually be able to tolerate spironolactone with the recurrent hyponatremia issues   Appreciate TRH, Nephrology care  Gatha Mayer, MD, [facility] 07/02/2019 6:35 PM

## 2019-07-03 ENCOUNTER — Inpatient Hospital Stay (HOSPITAL_COMMUNITY): Payer: Self-pay

## 2019-07-03 LAB — CBC WITH DIFFERENTIAL/PLATELET
Abs Immature Granulocytes: 0.02 10*3/uL (ref 0.00–0.07)
Basophils Absolute: 0 10*3/uL (ref 0.0–0.1)
Basophils Relative: 1 %
Eosinophils Absolute: 0.3 10*3/uL (ref 0.0–0.5)
Eosinophils Relative: 7 %
HCT: 32.4 % — ABNORMAL LOW (ref 39.0–52.0)
Hemoglobin: 11 g/dL — ABNORMAL LOW (ref 13.0–17.0)
Immature Granulocytes: 1 %
Lymphocytes Relative: 32 %
Lymphs Abs: 1.4 10*3/uL (ref 0.7–4.0)
MCH: 32.5 pg (ref 26.0–34.0)
MCHC: 34 g/dL (ref 30.0–36.0)
MCV: 95.9 fL (ref 80.0–100.0)
Monocytes Absolute: 0.7 10*3/uL (ref 0.1–1.0)
Monocytes Relative: 17 %
Neutro Abs: 1.8 10*3/uL (ref 1.7–7.7)
Neutrophils Relative %: 42 %
Platelets: 151 10*3/uL (ref 150–400)
RBC: 3.38 MIL/uL — ABNORMAL LOW (ref 4.22–5.81)
RDW: 16.5 % — ABNORMAL HIGH (ref 11.5–15.5)
WBC: 4.3 10*3/uL (ref 4.0–10.5)
nRBC: 0 % (ref 0.0–0.2)

## 2019-07-03 LAB — COMPREHENSIVE METABOLIC PANEL
ALT: 32 U/L (ref 0–44)
AST: 59 U/L — ABNORMAL HIGH (ref 15–41)
Albumin: 2.4 g/dL — ABNORMAL LOW (ref 3.5–5.0)
Alkaline Phosphatase: 81 U/L (ref 38–126)
Anion gap: 7 (ref 5–15)
BUN: 10 mg/dL (ref 6–20)
CO2: 27 mmol/L (ref 22–32)
Calcium: 8.4 mg/dL — ABNORMAL LOW (ref 8.9–10.3)
Chloride: 100 mmol/L (ref 98–111)
Creatinine, Ser: 1.07 mg/dL (ref 0.61–1.24)
GFR calc Af Amer: >60 mL/min (ref >60)
GFR calc non Af Amer: >60 mL/min (ref >60)
Glucose, Bld: 102 mg/dL — ABNORMAL HIGH (ref 70–99)
Potassium: 4.4 mmol/L (ref 3.5–5.1)
Sodium: 134 mmol/L — ABNORMAL LOW (ref 135–145)
Total Bilirubin: 1.4 mg/dL — ABNORMAL HIGH (ref 0.3–1.2)
Total Protein: 5.4 g/dL — ABNORMAL LOW (ref 6.5–8.1)

## 2019-07-03 LAB — PROTIME-INR
INR: 1.3 — ABNORMAL HIGH (ref 0.8–1.2)
Prothrombin Time: 15.9 seconds — ABNORMAL HIGH (ref 11.4–15.2)

## 2019-07-03 LAB — D-DIMER, QUANTITATIVE: D-Dimer, Quant: 9.77 ug/mL-FEU — ABNORMAL HIGH (ref 0.00–0.50)

## 2019-07-03 LAB — C-REACTIVE PROTEIN: CRP: 0.9 mg/dL (ref <1.0)

## 2019-07-03 LAB — MAGNESIUM: Magnesium: 2 mg/dL (ref 1.7–2.4)

## 2019-07-03 LAB — AMMONIA: Ammonia: 14 umol/L (ref 9–35)

## 2019-07-03 LAB — BRAIN NATRIURETIC PEPTIDE: B Natriuretic Peptide: 183.8 pg/mL — ABNORMAL HIGH (ref 0.0–100.0)

## 2019-07-03 MED ORDER — LIDOCAINE HCL (PF) 1 % IJ SOLN
INTRAMUSCULAR | Status: AC
  Filled 2019-07-03: qty 30

## 2019-07-03 MED ORDER — ALBUMIN HUMAN 25 % IV SOLN
50.0000 g | Freq: Once | INTRAVENOUS | Status: AC | PRN
  Administered 2019-07-03: 50 g via INTRAVENOUS
  Filled 2019-07-03: qty 200

## 2019-07-03 MED ORDER — LACTATED RINGERS IV SOLN: INTRAVENOUS | Status: AC | PRN

## 2019-07-03 NOTE — Progress Notes (Signed)
PROGRESS NOTE                                                                                                                                                                                                             Patient Demographics:    Shawn Martin, is a 44 y.o. male, DOB - 1975/11/19, MHD:[id number]  Admit date - 06/30/2019   Admitting Physician Mckinley Jewel, MD  Outpatient Primary MD for the patient is Patient, No Pcp Per  LOS - 3  Chief Complaint  Patient presents with  . Shortness of Breath  . Abdominal Pain    abdominal distension       Brief Narrative  Shawn Martin is a 44 y.o. male with medical history significant of alcoholic liver cirrhosis, celiac disease, hepatitis C, hypertension, esophageal varices, portal hypertensive gastropathy presents to emergency department with increasing abdominal bloating and shortness of breath, he was found to have massive recurrent ascites, apparently he has been requiring ultrasound-guided paracentesis for large amounts of fluid removal once or twice a week.  He is being followed by GI physician Dr. Havery Moros.  He was admitted for massive symptomatic ascites.   Subjective:   Patient in bed, appears comfortable, denies any headache, no fever, no chest pain or pressure, no shortness of breath , no abdominal pain. No focal weakness.    Assessment  & Plan :    Note - he was seen on 07/02/2019, Note missed in error   1.  Massive recurrent ascites causing shortness of breath and discomfort in a patient with history of alcoholic cirrhosis, some question of autoimmune liver injury as well.  IR was consulted and he underwent large-volume therapeutic paracentesis on 07/01/2019, total of 10 L of fluid was removed with IV albumin and midodrine for supportive care.  Pressure has helped nicely however has ascites has returned on 07/19/2019, will request IR to remove another 8 to 12 L of fluid today again with albumin and  midodrine.  Discussed with GI, most likely discharge on 07/04/2019, outpatient TIPS procedure to be considered.  Echo ordered is still pending.  2.  Hyponatremia.  Significant due to #1 above.  Fluid removal and single dose Samsca much improved..  3.  Alcohol abuse.  Quit early January 2021.  Requested to continue to abstain.  4.  History of celiac disease.  Supportive care.  Gluten-free diet here.  5. Sudden and brief episode of excruciating right thigh pain.  Cause unclear, exam unremarkable, negative ultrasound,  symptoms completely resolved within a few minutes.  6. Incidental and Symptom free Covid Infection - monitor.  COVID-19 Labs  Recent Labs    07/01/19 0818 07/02/19 0314 07/03/19 0620  DDIMER 6.90* 6.80* 9.77*  CRP 0.7 0.9 0.9    Lab Results  Component Value Date   SARSCOV2NAA POSITIVE (A) 06/30/2019   [place] Not Detected 04/11/2019   [place] Not Detected 03/07/2019   SARSCOV2NAA RESULT:  NEGATIVE 02/04/2019    Family Communication  : Called wife Crystal on [phone] - 07/01/19, 07/03/19  Code Status :  Full  Disposition Plan  : Stay in the hospital for treatment of massive recurrent ascites due to underlying cirrhosis.  Needs second large-volume paracentesis.  Consults  : GI  Procedures  :    Lower extremity venous duplex.  No DVT.    Echocardiogram pending.  Large Volume therapeutic ultrasound-guided paracentesis first done on 07/01/2019 with the liters removed, second to be done on 07/03/2019 order for 08/18/2019   DVT Prophylaxis  :   SCDs    Lab Results  Component Value Date   PLT 151 07/03/2019   Lab Results  Component Value Date   INR 1.3 (H) 07/03/2019   INR 1.2 07/02/2019   INR 1.2 07/01/2019    Diet :  Diet Order            Diet gluten free Room service appropriate? Yes; Fluid consistency: Thin  Diet effective now               Inpatient Medications Scheduled Meds: . ferrous sulfate  325 mg Oral Q breakfast  . lidocaine  (PF)      . midodrine  10 mg Oral TID WC  . multivitamin with minerals  1 tablet Oral Daily   Continuous Infusions: . albumin human     PRN Meds:.acetaminophen **OR** [DISCONTINUED] acetaminophen, albumin human, lidocaine, magnesium gluconate, [DISCONTINUED] ondansetron **OR** ondansetron (ZOFRAN) IV  Antibiotics  :   Anti-infectives (From admission, onward)   Start     Dose/Rate Route Frequency Ordered Stop   06/30/19 1445  sulfamethoxazole-trimethoprim (BACTRIM DS) 800-160 MG per tablet 1 tablet  Status:  Discontinued     1 tablet Oral Daily 06/30/19 1437 06/30/19 1915          Objective:   Vitals:   07/02/19 0630 07/02/19 1411 07/02/19 2022 07/03/19 0504  BP: 123/86 114/69 114/71 107/64  Pulse: 92 91 90 89  Resp: (!) 23 20 19 19   Temp: 98.1 F (36.7 C) 97.8 F (36.6 C) 98.3 F (36.8 C) 97.8 F (36.6 C)  TempSrc: Oral Oral    SpO2: 96% 100% 100% 99%  Weight:      Height:        SpO2: 99 % O2 Flow Rate (L/min): 2 L/min  Wt Readings from Last 3 Encounters:  07/02/19 95 kg  06/22/19 105 kg  06/09/19 104.4 kg     Intake/Output Summary (Last 24 hours) at 07/03/2019 1102 Last data filed at 07/02/2019 1300 Gross per 24 hour  Intake 210 ml  Output 200 ml  Net 10 ml     Physical Exam  Awake Alert, No new F.N deficits, Normal affect .AT,PERRAL Supple Neck,No JVD, No cervical lymphadenopathy appriciated.  Symmetrical Chest wall movement, Good air movement bilaterally, CTAB RRR,No Gallops, Rubs or new Murmurs, No Parasternal Heave +ve B.Sounds, Abd Soft but distended with soft ascites, No tenderness, No organomegaly appriciated, No rebound - guarding or rigidity. No Cyanosis, Clubbing or edema, No new  Rash or bruise    Data Review:    Recent Labs  Lab 06/30/19 1151 07/01/19 0528 07/02/19 0314 07/03/19 0620  WBC 5.3 5.2 4.1 4.3  HGB 12.0* 10.7* 10.5* 11.0*  HCT 34.6* 30.0* 30.2* 32.4*  PLT 189 149* 134* 151  MCV 90.8 90.9 91.5 95.9  MCH 31.5 32.4  31.8 32.5  MCHC 34.7 35.7 34.8 34.0  RDW 15.5 15.4 15.5 16.5*  LYMPHSABS 1.1  --  1.2 1.4  MONOABS 0.8  --  0.6 0.7  EOSABS 0.2  --  0.1 0.3  BASOSABS 0.0  --  0.0 0.0    Recent Labs  Lab 06/30/19 1151 06/30/19 1420 06/30/19 1432 07/01/19 0528 07/01/19 0818 07/02/19 0314 07/02/19 1340 07/03/19 0620  NA 121*  --   --  120*  --  134*  134* 130* 134*  K 5.2*  --   --  5.1  --  4.7  4.7 3.9 4.4  CL 88*  --   --  88*  --  99  99 96* 100  CO2 20*  --   --  23  --  27  27 25 27   GLUCOSE 103*  --   --  92  --  100*  101* 107* 102*  BUN 20  --   --  21*  --  17  17 14 10   CREATININE 1.52*  --   --  1.31*  --  1.24  1.24 1.07 1.07  CALCIUM 8.4*  --   --  8.4*  --  9.0  8.9 8.6* 8.4*  AST 82*  --   --  61*  --  51*  --  59*  ALT 43  --   --  34  --  29  --  32  ALKPHOS 99  --   --  83  --  73  --  81  BILITOT 2.5*  --   --  2.4*  --  1.9*  --  1.4*  ALBUMIN 2.1*  --   --  2.3*  --  2.7*  2.7*  --  2.4*  MG 2.0  --   --   --   --  2.2  --  2.0  CRP  --   --   --   --  0.7 0.9  --  0.9  DDIMER  --   --   --   --  6.90* 6.80*  --  9.77*  INR  --  1.2  --   --  1.2 1.2  --  1.3*  TSH  --   --  3.126  --   --   --   --   --   AMMONIA  --   --   --   --  20 9  --  14  BNP  --   --   --   --  145.7* 467.0*  --  183.8*    Recent Labs  Lab 06/30/19 1410 07/01/19 0818 07/02/19 0314 07/03/19 0620  CRP  --  0.7 0.9 0.9  DDIMER  --  6.90* 6.80* 9.77*  BNP  --  145.7* 467.0* 183.8*  SARSCOV2NAA POSITIVE*  --   --   --     ------------------------------------------------------------------------------------------------------------------ No results for input(s): CHOL, HDL, LDLCALC, TRIG, CHOLHDL, LDLDIRECT in the last 72 hours.  No results found for: HGBA1C ------------------------------------------------------------------------------------------------------------------ Recent Labs    06/30/19 1432  TSH 3.126    ------------------------------------------------------------------------------------------------------------------ Recent Labs  07/01/19 0818  TIBC 322  IRON 57    Coagulation profile Recent Labs  Lab 06/30/19 1420 07/01/19 0818 07/02/19 0314 07/03/19 0620  INR 1.2 1.2 1.2 1.3*    Recent Labs    07/02/19 0314 07/03/19 0620  DDIMER 6.80* 9.77*    Cardiac Enzymes No results for input(s): CKMB, TROPONINI, MYOGLOBIN in the last 168 hours.  Invalid input(s): CK ------------------------------------------------------------------------------------------------------------------    Component Value Date/Time   BNP 183.8 (H) 07/03/2019 9201    Micro Results Recent Results (from the past 240 hour(s))  Respiratory Panel by RT PCR (Flu A&B, Covid) - Nasopharyngeal Swab     Status: Abnormal   Collection Time: 06/30/19  2:10 PM   Specimen: Nasopharyngeal Swab  Result Value Ref Range Status   SARS Coronavirus 2 by RT PCR POSITIVE (A) NEGATIVE Final    Comment: RESULT CALLED TO, READ BACK BY AND VERIFIED WITH: Chalmers Cater RN 16:00 06/30/19 ([email or web address]) (NOTE) SARS-CoV-2 target nucleic acids are DETECTED. SARS-CoV-2 RNA is generally detectable in upper respiratory specimens  during the acute phase of infection. Positive results are indicative of the presence of the identified virus, but do not rule out bacterial infection or co-infection with other pathogens not detected by the test. Clinical correlation with patient history and other diagnostic information is necessary to determine patient infection status. The expected result is Negative. Fact Sheet for Patients:  PinkCheek.be Fact Sheet for Healthcare Providers: GravelBags.it This test is not yet approved or cleared by the [place] FDA and  has been authorized for detection and/or diagnosis of SARS-CoV-2 by FDA under an Emergency Use Authorization (EUA).  This EUA  will remain in effect (meaning this test can be used) f or the duration of  the COVID-19 declaration under Section 564(b)(1) of the Act, 21 U.S.C. section 360bbb-3(b)(1), unless the authorization is terminated or revoked sooner.    Influenza A by PCR NEGATIVE NEGATIVE Final   Influenza B by PCR NEGATIVE NEGATIVE Final    Comment: (NOTE) The Xpert Xpress SARS-CoV-2/FLU/RSV assay is intended as an aid in  the diagnosis of influenza from Nasopharyngeal swab specimens and  should not be used as a sole basis for treatment. Nasal washings and  aspirates are unacceptable for Xpert Xpress SARS-CoV-2/FLU/RSV  testing. Fact Sheet for Patients: PinkCheek.be Fact Sheet for Healthcare Providers: GravelBags.it This test is not yet approved or cleared by the [place] FDA and  has been authorized for detection and/or diagnosis of SARS-CoV-2 by  FDA under an Emergency Use Authorization (EUA). This EUA will remain  in effect (meaning this test can be used) for the duration of the  Covid-19 declaration under Section 564(b)(1) of the Act, 21  U.S.C. section 360bbb-3(b)(1), unless the authorization is  terminated or revoked. Performed at [facility], [place] [address], [place], [place] [address]     Radiology Reports IR Venogram Hepatic Wo Hemodynamic Evaluation  Result Date: 06/22/2019 CLINICAL DATA:  Presumed cirrhosis, recurrent large volume abdominal ascites. EXAM: HEPATIC VENOGRAM TRANSJUGULAR LIVER BIOPSY FLUOROSCOPY TIME:  7.2 minutes; 2441  uGym2 DAP PROCEDURE: The procedure, risks (including but not limited to bleeding, infection, organ damage ), benefits, and alternatives were explained to the patient. Questions regarding the procedure were encouraged and answered. The patient understands and consents to the procedure. Large volume paracentesis was initiated immediately preceding this procedure, dictated separately. Patency of  the right IJ vein was confirmed with ultrasound with image documentation. An appropriate skin site was determined. Skin site was marked, prepped  with chlorhexidine, and draped using maximum barrier technique. The region was infiltrated locally with 1% lidocaine. Intravenous Fentanyl 137mg and Versed 273mwere administered as conscious sedation during continuous monitoring of the patient's level of consciousness and physiological / cardiorespiratory status by the radiology RN, with a total moderate sedation time of 20 minutes. Under real-time ultrasound guidance, the right IJ vein was accessed with a 21 gauge micropuncture needle; the needle tip within the vein was confirmed with ultrasound image documentation. . Needle was exchanged over guidewire for transitional dilator, which allowed passage of a J wire into the IVC. Over this, a 5 FrPakistanngiographic catheter was advanced and used to selectively catheterize the right hepatic vein. Confirmatory venography was performed. A 9 French angled sheath was advanced into the right hepatic vein. The catheter was exchanged over the guidewire for the delivery sheath of the transjugular core biopsy needle. Multiple 18-gauge core liver biopsy samples were obtained. The sheath was removed and hemostasis achieved at the site. Patient tolerated procedure well, with no immediate complication. IMPRESSION: 1. Right hepatic venogram confirms appropriate catheter position 2. Technically successful transjugular core liver biopsy . Electronically Signed   By: D Lucrezia [place].D.   On: 06/22/2019 17:25   IR Transcatheter BX  Result Date: 06/22/2019 CLINICAL DATA:  Presumed cirrhosis, recurrent large volume abdominal ascites. EXAM: HEPATIC VENOGRAM TRANSJUGULAR LIVER BIOPSY FLUOROSCOPY TIME:  7.2 minutes; 2441  uGym2 DAP PROCEDURE: The procedure, risks (including but not limited to bleeding, infection, organ damage ), benefits, and alternatives were explained to the patient. Questions  regarding the procedure were encouraged and answered. The patient understands and consents to the procedure. Large volume paracentesis was initiated immediately preceding this procedure, dictated separately. Patency of the right IJ vein was confirmed with ultrasound with image documentation. An appropriate skin site was determined. Skin site was marked, prepped with chlorhexidine, and draped using maximum barrier technique. The region was infiltrated locally with 1% lidocaine. Intravenous Fentanyl 10020mand Versed 2mg4mre administered as conscious sedation during continuous monitoring of the patient's level of consciousness and physiological / cardiorespiratory status by the radiology RN, with a total moderate sedation time of 20 minutes. Under real-time ultrasound guidance, the right IJ vein was accessed with a 21 gauge micropuncture needle; the needle tip within the vein was confirmed with ultrasound image documentation. . Needle was exchanged over guidewire for transitional dilator, which allowed passage of a J wire into the IVC. Over this, a 5 FrenPakistaniographic catheter was advanced and used to selectively catheterize the right hepatic vein. Confirmatory venography was performed. A 9 French angled sheath was advanced into the right hepatic vein. The catheter was exchanged over the guidewire for the delivery sheath of the transjugular core biopsy needle. Multiple 18-gauge core liver biopsy samples were obtained. The sheath was removed and hemostasis achieved at the site. Patient tolerated procedure well, with no immediate complication. IMPRESSION: 1. Right hepatic venogram confirms appropriate catheter position 2. Technically successful transjugular core liver biopsy . Electronically Signed   By: D  HLucrezia Europe.   On: 06/22/2019 17:25   US PK[place]acentesis  Result Date: 06/04/2019 INDICATION: Patient with history of cirrhosis, recurrent ascites. Request is made for diagnostic and therapeutic paracentesis.  EXAM: ULTRASOUND GUIDED DIAGNOSTIC AND THERAPEUTIC PARACENTESIS MEDICATIONS: 10 mL 1% lidocaine COMPLICATIONS: None immediate. PROCEDURE: Informed written consent was obtained from the patient after a discussion of the risks, benefits and alternatives to treatment. A timeout was performed prior to the initiation of the procedure. Initial ultrasound scanning  demonstrates a large amount of ascites within the left lateral abdomen. The left lateral abdomen was prepped and draped in the usual sterile fashion. 1% lidocaine was used for local anesthesia. Following this, a 19 gauge, 10-cm, Yueh catheter was introduced. An ultrasound image was saved for documentation purposes. The paracentesis was performed. The catheter was removed and a dressing was applied. The patient tolerated the procedure well without immediate post procedural complication. FINDINGS: A total of approximately 5.0 liters of yellow fluid was removed. Samples were sent to the laboratory as requested by the clinical team. IMPRESSION: Successful ultrasound-guided paracentesis yielding 5.0 liters of peritoneal fluid. Read by: Brynda Greathouse PA-C Electronically Signed   By: Sandi Mariscal M.D.   On: 06/04/2019 15:07   IR US Guide Vasc Access Right  Result Date: 06/22/2019 CLINICAL DATA:  Presumed cirrhosis, recurrent large volume abdominal ascites. EXAM: HEPATIC VENOGRAM TRANSJUGULAR LIVER BIOPSY FLUOROSCOPY TIME:  7.2 minutes; 2441  uGym2 DAP PROCEDURE: The procedure, risks (including but not limited to bleeding, infection, organ damage ), benefits, and alternatives were explained to the patient. Questions regarding the procedure were encouraged and answered. The patient understands and consents to the procedure. Large volume paracentesis was initiated immediately preceding this procedure, dictated separately. Patency of the right IJ vein was confirmed with ultrasound with image documentation. An appropriate skin site was determined. Skin site was marked,  prepped with chlorhexidine, and draped using maximum barrier technique. The region was infiltrated locally with 1% lidocaine. Intravenous Fentanyl 160mg and Versed 250mwere administered as conscious sedation during continuous monitoring of the patient's level of consciousness and physiological / cardiorespiratory status by the radiology RN, with a total moderate sedation time of 20 minutes. Under real-time ultrasound guidance, the right IJ vein was accessed with a 21 gauge micropuncture needle; the needle tip within the vein was confirmed with ultrasound image documentation. . Needle was exchanged over guidewire for transitional dilator, which allowed passage of a J wire into the IVC. Over this, a 5 FrPakistanngiographic catheter was advanced and used to selectively catheterize the right hepatic vein. Confirmatory venography was performed. A 9 French angled sheath was advanced into the right hepatic vein. The catheter was exchanged over the guidewire for the delivery sheath of the transjugular core biopsy needle. Multiple 18-gauge core liver biopsy samples were obtained. The sheath was removed and hemostasis achieved at the site. Patient tolerated procedure well, with no immediate complication. IMPRESSION: 1. Right hepatic venogram confirms appropriate catheter position 2. Technically successful transjugular core liver biopsy . Electronically Signed   By: D Lucrezia [place].D.   On: 06/22/2019 17:25   DG Chest Portable 1 View  Result Date: 06/30/2019 CLINICAL DATA:  Shortness of breath EXAM: PORTABLE CHEST 1 VIEW COMPARISON:  None. FINDINGS: Note shallow degree of inspiration. There is a rather minimal pleural effusion on each side. There is slight bibasilar atelectasis. The lungs elsewhere are clear. Heart size and pulmonary vascularity are normal. No adenopathy. No bone lesions. IMPRESSION: Rather minimal pleural effusions bilaterally with bibasilar atelectasis. Lungs elsewhere clear. Cardiac silhouette normal.  Electronically Signed   By: WiLowella GripII M.D.   On: 06/30/2019 13:23   VAS US[place]OWER EXTREMITY VENOUS (DVT)  Result Date: 07/03/2019  Lower Venous DVTStudy Indications: Swelling.  Comparison Study: No prior exam. Performing Technologist: KrBaldwin CrownRDMS, RVT  Examination Guidelines: A complete evaluation includes B-mode imaging, spectral Doppler, color Doppler, and power Doppler as needed of all accessible portions of each vessel. Bilateral testing is considered an  integral part of a complete examination. Limited examinations for reoccurring indications may be performed as noted. The reflux portion of the exam is performed with the patient in reverse Trendelenburg.  +---------+---------------+---------+-----------+----------+--------------+ RIGHT    CompressibilityPhasicitySpontaneityPropertiesThrombus Aging +---------+---------------+---------+-----------+----------+--------------+ CFV      Full           Yes      Yes                                 +---------+---------------+---------+-----------+----------+--------------+ SFJ      Full                                                        +---------+---------------+---------+-----------+----------+--------------+ FV Prox  Full                                                        +---------+---------------+---------+-----------+----------+--------------+ FV Mid   Full                                                        +---------+---------------+---------+-----------+----------+--------------+ FV DistalFull                                                        +---------+---------------+---------+-----------+----------+--------------+ PFV      Full                                                        +---------+---------------+---------+-----------+----------+--------------+ POP      Full           Yes      Yes                                  +---------+---------------+---------+-----------+----------+--------------+ PTV      Full                                                        +---------+---------------+---------+-----------+----------+--------------+ PERO     Full                                                        +---------+---------------+---------+-----------+----------+--------------+   +---------+---------------+---------+-----------+----------+--------------+ LEFT     CompressibilityPhasicitySpontaneityPropertiesThrombus Aging +---------+---------------+---------+-----------+----------+--------------+ CFV  Full           Yes      Yes                                 +---------+---------------+---------+-----------+----------+--------------+ SFJ      Full                                                        +---------+---------------+---------+-----------+----------+--------------+ FV Prox  Full                                                        +---------+---------------+---------+-----------+----------+--------------+ FV Mid   Full                                                        +---------+---------------+---------+-----------+----------+--------------+ FV DistalFull                                                        +---------+---------------+---------+-----------+----------+--------------+ PFV      Full                                                        +---------+---------------+---------+-----------+----------+--------------+ POP      Full           Yes      Yes                                 +---------+---------------+---------+-----------+----------+--------------+ PTV      Full                                                        +---------+---------------+---------+-----------+----------+--------------+ PERO     Full                                                         +---------+---------------+---------+-----------+----------+--------------+     Summary: BILATERAL: - No evidence of deep vein thrombosis seen in the lower extremities, bilaterally.  RIGHT: - No cystic structure found in the popliteal fossa.  LEFT: - No cystic structure found in the popliteal fossa.  *See table(s) above for measurements and observations. Electronically signed by Curt Jews MD on 07/03/2019 at 8:18:32 AM.  Final    IR Paracentesis  Result Date: 07/01/2019 INDICATION: Patient with history of cirrhosis, recurrent ascites requiring frequent large volume paracentesis. Request to IR for diagnostic and therapeutic paracentesis with 10 L maximum. EXAM: ULTRASOUND GUIDED DIAGNOSTIC AND THERAPEUTIC PARACENTESIS MEDICATIONS: 10 mL 1% lidocaine COMPLICATIONS: None immediate. PROCEDURE: Informed written consent was obtained from the patient after a discussion of the risks, benefits and alternatives to treatment. A timeout was performed prior to the initiation of the procedure. Initial ultrasound scanning demonstrates a large amount of ascites within the right lower abdominal quadrant. The right lower abdomen was prepped and draped in the usual sterile fashion. 1% lidocaine was used for local anesthesia. Following this, a 19 gauge, 7-cm, Yueh catheter was introduced. An ultrasound image was saved for documentation purposes. The paracentesis was performed. The catheter was removed and a dressing was applied. The patient tolerated the procedure well without immediate post procedural complication. Patient received post-procedure intravenous albumin; see nursing notes for details. FINDINGS: A total of approximately 10.0 L of hazy yellow fluid was removed. Samples were sent to the laboratory as requested by the clinical team. IMPRESSION: Successful ultrasound-guided paracentesis yielding 10.0 liters of peritoneal fluid. Read by Candiss Norse, PA-C Electronically Signed   By: Jerilynn Mages.  Shick M.D.   On: 07/01/2019  13:27   IR Paracentesis  Result Date: 06/23/2019 INDICATION: Recurrent large volume symptomatic abdominal ascites EXAM: ULTRASOUND GUIDED  PARACENTESIS MEDICATIONS: None. COMPLICATIONS: None immediate. PROCEDURE: Informed written consent was obtained from the patient after a discussion of the risks, benefits and alternatives to treatment. A timeout was performed prior to the initiation of the procedure. Initial ultrasound scanning demonstrates a large amount of ascites within the right lower abdominal quadrant. The right lower abdomen was prepped and draped in the usual sterile fashion. 1% lidocaine was used for local anesthesia. Following this, a 6 Fr Safe-T-Centesis catheter was introduced. An ultrasound image was saved for documentation purposes. The paracentesis was performed. The catheter was removed and a dressing was applied. The patient tolerated the procedure well without immediate post procedural complication. Patient received post-procedure intravenous albumin; see nursing notes for details. FINDINGS: A total of approximately 10 L of   clear yellow fluid was removed. IMPRESSION: Successful ultrasound-guided paracentesis yielding 10 liters of peritoneal fluid. Electronically Signed   By: Lucrezia Europe M.D.   On: 06/23/2019 08:10   IR Paracentesis  Result Date: 06/15/2019 INDICATION: Patient with history of cirrhosis, recurrent ascites. Request to IR for therapeutic paracentesis with 5 L maximum. EXAM: ULTRASOUND GUIDED THERAPEUTIC PARACENTESIS MEDICATIONS: 10 mL 1% lidocaine COMPLICATIONS: None immediate. PROCEDURE: Informed written consent was obtained from the patient after a discussion of the risks, benefits and alternatives to treatment. A timeout was performed prior to the initiation of the procedure. Initial ultrasound scanning demonstrates a large amount of ascites within the right lower abdominal quadrant. The right lower abdomen was prepped and draped in the usual sterile fashion. 1%  lidocaine was used for local anesthesia. Following this, a 19 gauge, 7-cm, Yueh catheter was introduced. An ultrasound image was saved for documentation purposes. The paracentesis was performed. The catheter was removed and a dressing was applied. The patient tolerated the procedure well without immediate post procedural complication. FINDINGS: A total of approximately 5.0 L of hazy pale yellow fluid was removed. IMPRESSION: Successful ultrasound-guided paracentesis yielding 5.0 liters of peritoneal fluid. Read by Candiss Norse, PA-C Electronically Signed   By: Markus Daft M.D.   On: 06/15/2019 14:56   IR Paracentesis  Result Date: 06/10/2019 INDICATION: Patient with history of cirrhosis, recurrent ascites. Request is made for diagnostic and therapeutic paracentesis up to 5 L maximum. EXAM: ULTRASOUND GUIDED DIAGNOSTIC AND THERAPEUTIC PARACENTESIS MEDICATIONS: 10 mL 1% lidocaine COMPLICATIONS: None immediate. PROCEDURE: Informed written consent was obtained from the patient after a discussion of the risks, benefits and alternatives to treatment. A timeout was performed prior to the initiation of the procedure. Initial ultrasound scanning demonstrates a large amount of ascites within the left lateral abdomen. The left lateral abdomen was prepped and draped in the usual sterile fashion. 1% lidocaine was used for local anesthesia. Following this, a 19 gauge, 7-cm, Yueh catheter was introduced. An ultrasound image was saved for documentation purposes. The paracentesis was performed. The catheter was removed and a dressing was applied. The patient tolerated the procedure well without immediate post procedural complication. FINDINGS: A total of approximately 5.0 liters of yellow fluid was removed. Samples were sent to the laboratory as requested by the clinical team. IMPRESSION: Successful ultrasound-guided diagnostic and therapeutic paracentesis yielding 5.0 liters of peritoneal fluid. Read by: Brynda Greathouse PA-C  Electronically Signed   By: Jerilynn Mages.  Shick M.D.   On: 06/10/2019 12:17    Time Spent in minutes  30   Lala Lund M.D on 07/03/2019 at 11:02 AM  To page go to [email or web address] - password [facility]

## 2019-07-03 NOTE — Progress Notes (Signed)
   Discussion w/ Dr. Candiss Norse  He plans another large vol paracentesis tomorrow given rapid re-accumulation and I agree  Na 134  Since he has Covid how to do f/u paracentesis in short-term and how to do lab f/u in short-term need to be sorted out - I am not sure how that works though ? Outpatient Covid clinic for labs.  We can find that out tomorrow.   Appreciate excellent [facility] care  Gatha Mayer, MD, [facility] 07/03/2019 10:53 AM

## 2019-07-03 NOTE — Procedures (Signed)
PROCEDURE SUMMARY:  Successful US guided paracentesis from left lateral abdomen.  Yielded 8.4 liters of clear yellow fluid.  No immediate complications.  Patient tolerated well.  EBL = trace   Shawn Martin S Reniah Cottingham PA-C 07/03/2019 12:29 PM

## 2019-07-03 NOTE — Plan of Care (Signed)
  Problem: Education: Goal: Knowledge of General Education information will improve Description Including pain rating scale, medication(s)/side effects and non-pharmacologic comfort measures Outcome: Progressing   Problem: Health Behavior/Discharge Planning: Goal: Ability to manage health-related needs will improve Outcome: Progressing   

## 2019-07-04 ENCOUNTER — Inpatient Hospital Stay (HOSPITAL_COMMUNITY): Payer: Self-pay

## 2019-07-04 DIAGNOSIS — I5031 Acute diastolic (congestive) heart failure: Secondary | ICD-10-CM

## 2019-07-04 LAB — CBC WITH DIFFERENTIAL/PLATELET
Abs Immature Granulocytes: 0.01 10*3/uL (ref 0.00–0.07)
Basophils Absolute: 0 10*3/uL (ref 0.0–0.1)
Basophils Relative: 1 %
Eosinophils Absolute: 0.4 10*3/uL (ref 0.0–0.5)
Eosinophils Relative: 11 %
HCT: 30.9 % — ABNORMAL LOW (ref 39.0–52.0)
Hemoglobin: 10.7 g/dL — ABNORMAL LOW (ref 13.0–17.0)
Immature Granulocytes: 0 %
Lymphocytes Relative: 33 %
Lymphs Abs: 1.3 10*3/uL (ref 0.7–4.0)
MCH: 32.3 pg (ref 26.0–34.0)
MCHC: 34.6 g/dL (ref 30.0–36.0)
MCV: 93.4 fL (ref 80.0–100.0)
Monocytes Absolute: 0.6 10*3/uL (ref 0.1–1.0)
Monocytes Relative: 14 %
Neutro Abs: 1.6 10*3/uL — ABNORMAL LOW (ref 1.7–7.7)
Neutrophils Relative %: 41 %
Platelets: 129 10*3/uL — ABNORMAL LOW (ref 150–400)
RBC: 3.31 MIL/uL — ABNORMAL LOW (ref 4.22–5.81)
RDW: 16 % — ABNORMAL HIGH (ref 11.5–15.5)
WBC: 3.9 10*3/uL — ABNORMAL LOW (ref 4.0–10.5)
nRBC: 0 % (ref 0.0–0.2)

## 2019-07-04 LAB — BRAIN NATRIURETIC PEPTIDE: B Natriuretic Peptide: 163.5 pg/mL — ABNORMAL HIGH (ref 0.0–100.0)

## 2019-07-04 LAB — COMPREHENSIVE METABOLIC PANEL
ALT: 29 U/L (ref 0–44)
AST: 50 U/L — ABNORMAL HIGH (ref 15–41)
Albumin: 2.3 g/dL — ABNORMAL LOW (ref 3.5–5.0)
Alkaline Phosphatase: 73 U/L (ref 38–126)
Anion gap: 8 (ref 5–15)
BUN: 10 mg/dL (ref 6–20)
CO2: 23 mmol/L (ref 22–32)
Calcium: 8.1 mg/dL — ABNORMAL LOW (ref 8.9–10.3)
Chloride: 99 mmol/L (ref 98–111)
Creatinine, Ser: 0.84 mg/dL (ref 0.61–1.24)
GFR calc Af Amer: >60 mL/min (ref >60)
GFR calc non Af Amer: >60 mL/min (ref >60)
Glucose, Bld: 96 mg/dL (ref 70–99)
Potassium: 4.3 mmol/L (ref 3.5–5.1)
Sodium: 130 mmol/L — ABNORMAL LOW (ref 135–145)
Total Bilirubin: 1.3 mg/dL — ABNORMAL HIGH (ref 0.3–1.2)
Total Protein: 4.9 g/dL — ABNORMAL LOW (ref 6.5–8.1)

## 2019-07-04 LAB — C-REACTIVE PROTEIN: CRP: 0.8 mg/dL (ref <1.0)

## 2019-07-04 LAB — AMMONIA: Ammonia: 23 umol/L (ref 9–35)

## 2019-07-04 LAB — PROTIME-INR
INR: 1.3 — ABNORMAL HIGH (ref 0.8–1.2)
Prothrombin Time: 16.1 seconds — ABNORMAL HIGH (ref 11.4–15.2)

## 2019-07-04 LAB — ECHOCARDIOGRAM LIMITED
Height: 72 in
Weight: 3350.99 oz

## 2019-07-04 LAB — D-DIMER, QUANTITATIVE: D-Dimer, Quant: 8.63 ug/mL-FEU — ABNORMAL HIGH (ref 0.00–0.50)

## 2019-07-04 LAB — MAGNESIUM: Magnesium: 1.8 mg/dL (ref 1.7–2.4)

## 2019-07-04 LAB — PATHOLOGIST SMEAR REVIEW

## 2019-07-04 MED ORDER — SPIRONOLACTONE 25 MG PO TABS
50.0000 mg | ORAL_TABLET | Freq: Every day | ORAL | Status: DC
  Administered 2019-07-04: 50 mg via ORAL
  Filled 2019-07-04: qty 2

## 2019-07-04 MED ORDER — SPIRONOLACTONE 100 MG PO TABS: 100.0000 mg | ORAL_TABLET | Freq: Two times a day (BID) | ORAL | 0 refills | Status: DC

## 2019-07-04 MED ORDER — SPIRONOLACTONE 25 MG PO TABS
50.0000 mg | ORAL_TABLET | Freq: Once | ORAL | Status: AC
  Administered 2019-07-04: 50 mg via ORAL
  Filled 2019-07-04: qty 2

## 2019-07-04 MED ORDER — FUROSEMIDE 40 MG PO TABS: 40.0000 mg | ORAL_TABLET | Freq: Two times a day (BID) | ORAL | 0 refills | Status: DC

## 2019-07-04 MED ORDER — FUROSEMIDE 40 MG PO TABS
40.0000 mg | ORAL_TABLET | Freq: Once | ORAL | Status: AC
  Administered 2019-07-04: 40 mg via ORAL
  Filled 2019-07-04: qty 1

## 2019-07-04 MED ORDER — FUROSEMIDE 40 MG PO TABS
40.0000 mg | ORAL_TABLET | Freq: Every day | ORAL | Status: DC
  Administered 2019-07-04: 40 mg via ORAL
  Filled 2019-07-04: qty 1

## 2019-07-04 NOTE — Progress Notes (Signed)
Daily Rounding Note  07/04/2019, 11:05 AM  LOS: 4 days   SUBJECTIVE:   Chief complaint: Abdominal distention.  Hyponatremia.    Patient's belly feels much better, distention significantly improved.  Eating well.  Urinating fine.  No nausea, vomiting, abdominal pain.  No weakness or fatigue.  OBJECTIVE:         Vital signs in last 24 hours:    Temp:  [97.6 F (36.4 C)-98.3 F (36.8 C)] 97.6 F (36.4 C) (04/04 1947) Pulse Rate:  [88-89] 89 (04/04 1947) Resp:  [18] 18 (04/04 1947) BP: (105-114)/(66-72) 105/68 (04/04 1452) SpO2:  [99 %-100 %] 99 % (04/04 1947) Last BM Date: 07/01/19 Filed Weights   07/01/19 0452 07/02/19 0500  Weight: 110.1 kg 95 kg   Pt not re-examined.    Intake/Output from previous day: No intake/output data recorded.  Intake/Output this shift: No intake/output data recorded.  Lab Results: Recent Labs    07/02/19 0314 07/03/19 0620 07/04/19 0235  WBC 4.1 4.3 3.9*  HGB 10.5* 11.0* 10.7*  HCT 30.2* 32.4* 30.9*  PLT 134* 151 129*   BMET Recent Labs    07/02/19 1340 07/03/19 0620 07/04/19 0235  NA 130* 134* 130*  K 3.9 4.4 4.3  CL 96* 100 99  CO2 25 27 23   GLUCOSE 107* 102* 96  BUN 14 10 10   CREATININE 1.07 1.07 0.84  CALCIUM 8.6* 8.4* 8.1*   LFT Recent Labs    07/02/19 0314 07/03/19 0620 07/04/19 0235  PROT 5.7* 5.4* 4.9*  ALBUMIN 2.7*  2.7* 2.4* 2.3*  AST 51* 59* 50*  ALT 29 32 29  ALKPHOS 73 81 73  BILITOT 1.9* 1.4* 1.3*   PT/INR Recent Labs    07/03/19 0620 07/04/19 0235  LABPROT 15.9* 16.1*  INR 1.3* 1.3*   Hepatitis Panel No results for input(s): HEPBSAG, HCVAB, HEPAIGM, HEPBIGM in the last 72 hours.  Studies/Results: US Paracentesis  Result Date: 07/03/2019 INDICATION: Recurrent large volume ascites secondary to cirrhosis. Request for therapeutic paracentesis. EXAM: ULTRASOUND GUIDED PARACENTESIS MEDICATIONS: 1% lidocaine 10 mL COMPLICATIONS: None  immediate. PROCEDURE: Informed written consent was obtained from the patient after a discussion of the risks, benefits and alternatives to treatment. A timeout was performed prior to the initiation of the procedure. Initial ultrasound scanning demonstrates a large amount of ascites within the left lateral abdomen. The left lateral abdomen was prepped and draped in the usual sterile fashion. 1% lidocaine was used for local anesthesia. Following this, a 19 gauge, 7-cm, Yueh catheter was introduced. An ultrasound image was saved for documentation purposes. The paracentesis was performed. The catheter was removed and a dressing was applied. The patient tolerated the procedure well without immediate post procedural complication. Patient received post-procedure intravenous albumin; see nursing notes for details. FINDINGS: A total of approximately 8.4 L of clear yellow fluid was removed. IMPRESSION: Successful ultrasound-guided paracentesis yielding 8.4 liters of peritoneal fluid. Read by: Gareth Eagle, PA-C Electronically Signed   By: Jerilynn Mages.  Shick M.D.   On: 07/03/2019 12:29    ASSESMENT:   *   Refractory ascites, has required several large-volume paracentesis since admission. 10 L tap on 4/2 (no SBP), 8.4 L tap on 4/4.   *   Covid 19 Positive.    *   Hyponatremia, 120 >> 134 >> 130.  Better after dose Samsca.    *   etoh cirrhosis, ? Element of AIH?  *  Celiac dz.  On gluten free  diet.    PLAN   *    Per Dr. Havery Moros, patient okay to discharge home today. Important that patient keep his appointment with [facility], Dr. Zollie Scale, tmrw. Seeking his expertise regarding potential for TIPS placement.  Patient has been in contact with that clinic and waiting to hear back on whether or not they can arrange for a virtual visit..    *   At discharge diuretic should be Lasix 40 mg bid,  Aldactone 200 mg/day. Dr. Doyne Keel office staff will arrange for paracentesis as soon as this coming Friday with  removal of up to 10 L per tap and IV albumin afterwards. Future paracentesis will be complicated by his Covid positive status in the near term.  *   Will need fup Na levels, BMET at short interval (ideally would have been met at the end of this week). Res[facility]  Lab at [facility] has capability of drawing labs from Covid patients but need details as to how to set up and order.Azucena Freed  07/04/2019, 11:05 AM Phone [phone]

## 2019-07-04 NOTE — Discharge Summary (Signed)
Shawn Martin ZOX:[id number] DOB: [birth year] DOA: 06/30/2019  PCP: Patient, No Pcp Per  Admit date: 06/30/2019  Discharge date: 07/04/2019  Admitted From: Home   Disposition:  Home   Recommendations for Outpatient Follow-up:   Follow up with PCP in 1-2 weeks  PCP Please obtain BMP/CBC, 2 view CXR in 1week,  (see Discharge instructions)   PCP Please follow up on the following pending results: Check BMP, magnesium in 3 to 4 days, also check final echocardiogram report which was done for possible TIPS in the future   Home Health: None   Equipment/Devices: None  Consultations: GI, IR Discharge Condition: Stable    CODE STATUS: Full    Diet Recommendation: Heart Healthy - Gluten Free with 1.2 Lit fluid restriction/day    Chief Complaint  Patient presents with  . Shortness of Breath  . Abdominal Pain    abdominal distension     Brief history of present illness from the day of admission and additional interim summary    Shawn Martin a 44 y.o.malewith medical history significant ofalcoholic liver cirrhosis, celiac disease, hepatitis C, hypertension, esophageal varices, portal hypertensive gastropathy presents to emergency department with increasing abdominal bloating and shortness of breath, he was found to have massive recurrent ascites, apparently he has been requiring ultrasound-guided paracentesis for large amounts of fluid removal once or twice a week.  He is being followed by GI physician Dr. Havery Moros.  He was admitted for massive symptomatic ascites.                                                                  Hospital Course   1.  Massive recurrent ascites causing shortness of breath and discomfort in a patient with history of alcoholic cirrhosis, some question of autoimmune liver  injury as well.  IR was consulted and he underwent large-volume therapeutic paracentesis on 07/01/2019, total of 10 L of fluid was removed with IV albumin and midodrine for supportive care. He had a second procedure on 07/03/2019 by IR & another 8 L of fluid removed again with albumin and midodrine.  Discussed with GI, most likely discharge on 07/04/2019 & 07/05/19, outpatient TIPS procedure to be considered.  Echo ordered  For TIPS, report pending patient to follow-up with Dr. Havery Moros his GI physician within a week for results.  Patient is also following with hepatologist Dr. Coralyn Pear, next appointment is tomorrow given in writing not to miss his appointment tomorrow.  2.  Hyponatremia.  Significant due to #1 above.  Much improved after fluid removal, is being placed on high doses of diuretics upon discharge PCP and GI physician to monitor his BMP closely.  3.  Alcohol abuse.  Quit early January 2021.  Requested to continue to abstain.  4.  History of celiac disease.  Supportive  care.  Gluten-free diet here.  5. Sudden and brief episode of excruciating right thigh pain.  Cause unclear, exam unremarkable, negative ultrasound, symptoms completely resolved within a few minutes.  6. Incidental and Symptom free Covid Infection - monitor.  No treatment needed.   SpO2: 99 % O2 Flow Rate (L/min): 2 L/min  Recent Labs  Lab 06/30/19 1410 07/01/19 0818 07/02/19 0314 07/03/19 0620 07/04/19 0235  CRP  --  0.7 0.9 0.9 0.8  DDIMER  --  6.90* 6.80* 9.77* 8.63*  BNP  --  145.7* 467.0* 183.8* 163.5*  SARSCOV2NAA POSITIVE*  --   --   --   --     Hepatic Function Latest Ref Rng & Units 07/04/2019 07/03/2019 07/02/2019  Total Protein 6.5 - 8.1 g/dL 4.9(L) 5.4(L) -  Albumin 3.5 - 5.0 g/dL 2.3(L) 2.4(L) 2.7(L)  AST 15 - 41 U/L 50(H) 59(H) -  ALT 0 - 44 U/L 29 32 -  Alk Phosphatase 38 - 126 U/L 73 81 -  Total Bilirubin 0.3 - 1.2 mg/dL 1.3(H) 1.4(H) -  Bilirubin, Direct 0.0 - 0.2 mg/dL - - -     Discharge  diagnosis     Principal Problem:   Alcoholic cirrhosis of liver with ascites (HCC) Active Problems:   Hyponatremia   Alcohol dependence (HCC)   Hyperkalemia   AKI (acute kidney injury) [facility])    Discharge instructions    Discharge Instructions    Discharge instructions   Complete by: As directed    Follow with Primary MD  in 7 days, keep your Hepatology appointment tomorrow with Dr Coralyn Pear  Get CBC, CMP, 2 view Chest X ray -  checked next visit within 1 week by Primary MD     Activity: As tolerated with Full fall precautions use walker/cane & assistance as needed  Disposition Home   Diet: Heart Healthy - 1.2 Lits/day total fluid restriction.   Special Instructions: If you have smoked or chewed Tobacco  in the last 2 yrs please stop smoking, stop any regular Alcohol  and or any Recreational drug use.  On your next visit with your primary care physician please Get Medicines reviewed and adjusted.  Please request your Prim.MD to go over all Hospital Tests and Procedure/Radiological results at the follow up, please get all Hospital records sent to your Prim MD by signing hospital release before you go home.  If you experience worsening of your admission symptoms, develop shortness of breath, life threatening emergency, suicidal or homicidal thoughts you must seek medical attention immediately by calling 911 or calling your MD immediately  if symptoms less severe.  You Must read complete instructions/literature along with all the possible adverse reactions/side effects for all the Medicines you take and that have been prescribed to you. Take any new Medicines after you have completely understood and accpet all the possible adverse reactions/side effects.   Increase activity slowly   Complete by: As directed       Discharge Medications   Allergies as of 07/04/2019      Reactions   Gluten Meal Diarrhea   bloating      Medication List    STOP taking these medications     sulfamethoxazole-trimethoprim 800-160 MG tablet Commonly known as: BACTRIM DS     TAKE these medications   diphenhydramine-acetaminophen 25-500 MG Tabs tablet Commonly known as: TYLENOL PM Take 1 tablet by mouth at bedtime as needed (sleep).   furosemide 40 MG tablet Commonly known as: LASIX Take 1 tablet (40  mg total) by mouth 2 (two) times daily. What changed: how much to take   magnesium gluconate 500 MG tablet Commonly known as: MAGONATE Take 500 mg by mouth daily as needed (cramps).   multivitamin with minerals Tabs tablet Take 1 tablet by mouth daily.   spironolactone 100 MG tablet Commonly known as: Aldactone Take 1 tablet (100 mg total) by mouth 2 (two) times daily. What changed:   how much to take  when to take this       Follow-up Information    Armbruster, [name], MD. Schedule an appointment as soon as possible for a visit in 3 day(s).   Specialty: Gastroenterology Contact information: [place] [address] [phone]        Kelvin Cellar, MD. Schedule an appointment as soon as possible for a visit in 1 day(s).   Specialty: Internal Medicine Contact information: [place] [place] [address] [phone]           Major procedures and Radiology Reports - PLEASE review detailed and final reports thoroughly  -       IR Venogram Hepatic Wo Hemodynamic Evaluation  Result Date: 06/22/2019 CLINICAL DATA:  Presumed cirrhosis, recurrent large volume abdominal ascites. EXAM: HEPATIC VENOGRAM TRANSJUGULAR LIVER BIOPSY FLUOROSCOPY TIME:  7.2 minutes; 2441  uGym2 DAP PROCEDURE: The procedure, risks (including but not limited to bleeding, infection, organ damage ), benefits, and alternatives were explained to the patient. Questions regarding the procedure were encouraged and answered. The patient understands and consents to the procedure. Large volume paracentesis was initiated immediately preceding this  procedure, dictated separately. Patency of the right IJ vein was confirmed with ultrasound with image documentation. An appropriate skin site was determined. Skin site was marked, prepped with chlorhexidine, and draped using maximum barrier technique. The region was infiltrated locally with 1% lidocaine. Intravenous Fentanyl 187mg and Versed 257mwere administered as conscious sedation during continuous monitoring of the patient's level of consciousness and physiological / cardiorespiratory status by the radiology RN, with a total moderate sedation time of 20 minutes. Under real-time ultrasound guidance, the right IJ vein was accessed with a 21 gauge micropuncture needle; the needle tip within the vein was confirmed with ultrasound image documentation. . Needle was exchanged over guidewire for transitional dilator, which allowed passage of a J wire into the IVC. Over this, a 5 FrPakistanngiographic catheter was advanced and used to selectively catheterize the right hepatic vein. Confirmatory venography was performed. A 9 French angled sheath was advanced into the right hepatic vein. The catheter was exchanged over the guidewire for the delivery sheath of the transjugular core biopsy needle. Multiple 18-gauge core liver biopsy samples were obtained. The sheath was removed and hemostasis achieved at the site. Patient tolerated procedure well, with no immediate complication. IMPRESSION: 1. Right hepatic venogram confirms appropriate catheter position 2. Technically successful transjugular core liver biopsy . Electronically Signed   By: D Lucrezia Europe.D.   On: 06/22/2019 17:25   IR Transcatheter BX  Result Date: 06/22/2019 CLINICAL DATA:  Presumed cirrhosis, recurrent large volume abdominal ascites. EXAM: HEPATIC VENOGRAM TRANSJUGULAR LIVER BIOPSY FLUOROSCOPY TIME:  7.2 minutes; 2441  uGym2 DAP PROCEDURE: The procedure, risks (including but not limited to bleeding, infection, organ damage ), benefits, and alternatives  were explained to the patient. Questions regarding the procedure were encouraged and answered. The patient understands and consents to the procedure. Large volume paracentesis was initiated immediately preceding this procedure, dictated separately. Patency of the  right IJ vein was confirmed with ultrasound with image documentation. An appropriate skin site was determined. Skin site was marked, prepped with chlorhexidine, and draped using maximum barrier technique. The region was infiltrated locally with 1% lidocaine. Intravenous Fentanyl 114mg and Versed 237mwere administered as conscious sedation during continuous monitoring of the patient's level of consciousness and physiological / cardiorespiratory status by the radiology RN, with a total moderate sedation time of 20 minutes. Under real-time ultrasound guidance, the right IJ vein was accessed with a 21 gauge micropuncture needle; the needle tip within the vein was confirmed with ultrasound image documentation. . Needle was exchanged over guidewire for transitional dilator, which allowed passage of a J wire into the IVC. Over this, a 5 FrPakistanngiographic catheter was advanced and used to selectively catheterize the right hepatic vein. Confirmatory venography was performed. A 9 French angled sheath was advanced into the right hepatic vein. The catheter was exchanged over the guidewire for the delivery sheath of the transjugular core biopsy needle. Multiple 18-gauge core liver biopsy samples were obtained. The sheath was removed and hemostasis achieved at the site. Patient tolerated procedure well, with no immediate complication. IMPRESSION: 1. Right hepatic venogram confirms appropriate catheter position 2. Technically successful transjugular core liver biopsy . Electronically Signed   By: D Lucrezia Europe.D.   On: 06/22/2019 17:25   [place]acentesis  Result Date: 07/03/2019 INDICATION: Recurrent large volume ascites secondary to cirrhosis. Request for therapeutic  paracentesis. EXAM: ULTRASOUND GUIDED PARACENTESIS MEDICATIONS: 1% lidocaine 10 mL COMPLICATIONS: None immediate. PROCEDURE: Informed written consent was obtained from the patient after a discussion of the risks, benefits and alternatives to treatment. A timeout was performed prior to the initiation of the procedure. Initial ultrasound scanning demonstrates a large amount of ascites within the left lateral abdomen. The left lateral abdomen was prepped and draped in the usual sterile fashion. 1% lidocaine was used for local anesthesia. Following this, a 19 gauge, 7-cm, Yueh catheter was introduced. An ultrasound image was saved for documentation purposes. The paracentesis was performed. The catheter was removed and a dressing was applied. The patient tolerated the procedure well without immediate post procedural complication. Patient received post-procedure intravenous albumin; see nursing notes for details. FINDINGS: A total of approximately 8.4 L of clear yellow fluid was removed. IMPRESSION: Successful ultrasound-guided paracentesis yielding 8.4 liters of peritoneal fluid. Read by: WeGareth EaglePA-C Electronically Signed   By: M.Jerilynn Mages Shick M.D.   On: 07/03/2019 12:29   IR [place] Vasc Access Right  Result Date: 06/22/2019 CLINICAL DATA:  Presumed cirrhosis, recurrent large volume abdominal ascites. EXAM: HEPATIC VENOGRAM TRANSJUGULAR LIVER BIOPSY FLUOROSCOPY TIME:  7.2 minutes; 2441  uGym2 DAP PROCEDURE: The procedure, risks (including but not limited to bleeding, infection, organ damage ), benefits, and alternatives were explained to the patient. Questions regarding the procedure were encouraged and answered. The patient understands and consents to the procedure. Large volume paracentesis was initiated immediately preceding this procedure, dictated separately. Patency of the right IJ vein was confirmed with ultrasound with image documentation. An appropriate skin site was determined. Skin site was marked,  prepped with chlorhexidine, and draped using maximum barrier technique. The region was infiltrated locally with 1% lidocaine. Intravenous Fentanyl 10042mand Versed 2mg45mre administered as conscious sedation during continuous monitoring of the patient's level of consciousness and physiological / cardiorespiratory status by the radiology RN, with a total moderate sedation time of 20 minutes. Under real-time ultrasound guidance, the right IJ vein was accessed with a 21  gauge micropuncture needle; the needle tip within the vein was confirmed with ultrasound image documentation. . Needle was exchanged over guidewire for transitional dilator, which allowed passage of a J wire into the IVC. Over this, a 5 [place] angiographic catheter was advanced and used to selectively catheterize the right hepatic vein. Confirmatory venography was performed. A 9 French angled sheath was advanced into the right hepatic vein. The catheter was exchanged over the guidewire for the delivery sheath of the transjugular core biopsy needle. Multiple 18-gauge core liver biopsy samples were obtained. The sheath was removed and hemostasis achieved at the site. Patient tolerated procedure well, with no immediate complication. IMPRESSION: 1. Right hepatic venogram confirms appropriate catheter position 2. Technically successful transjugular core liver biopsy . Electronically Signed   By: Lucrezia Europe M.D.   On: 06/22/2019 17:25   DG Chest Portable 1 View  Result Date: 06/30/2019 CLINICAL DATA:  Shortness of breath EXAM: PORTABLE CHEST 1 VIEW COMPARISON:  None. FINDINGS: Note shallow degree of inspiration. There is a rather minimal pleural effusion on each side. There is slight bibasilar atelectasis. The lungs elsewhere are clear. Heart size and pulmonary vascularity are normal. No adenopathy. No bone lesions. IMPRESSION: Rather minimal pleural effusions bilaterally with bibasilar atelectasis. Lungs elsewhere clear. Cardiac silhouette normal.  Electronically Signed   By: Lowella Grip III M.D.   On: 06/30/2019 13:23   VAS [place] LOWER EXTREMITY VENOUS (DVT)  Result Date: 07/03/2019  Lower Venous DVTStudy Indications: Swelling.  Comparison Study: No prior exam. Performing Technologist: Baldwin Crown ARDMS, RVT  Examination Guidelines: A complete evaluation includes B-mode imaging, spectral Doppler, color Doppler, and power Doppler as needed of all accessible portions of each vessel. Bilateral testing is considered an integral part of a complete examination. Limited examinations for reoccurring indications may be performed as noted. The reflux portion of the exam is performed with the patient in reverse Trendelenburg.  +---------+---------------+---------+-----------+----------+--------------+ RIGHT    CompressibilityPhasicitySpontaneityPropertiesThrombus Aging +---------+---------------+---------+-----------+----------+--------------+ CFV      Full           Yes      Yes                                 +---------+---------------+---------+-----------+----------+--------------+ SFJ      Full                                                        +---------+---------------+---------+-----------+----------+--------------+ FV Prox  Full                                                        +---------+---------------+---------+-----------+----------+--------------+ FV Mid   Full                                                        +---------+---------------+---------+-----------+----------+--------------+ FV DistalFull                                                        +---------+---------------+---------+-----------+----------+--------------+  PFV      Full                                                        +---------+---------------+---------+-----------+----------+--------------+ POP      Full           Yes      Yes                                  +---------+---------------+---------+-----------+----------+--------------+ PTV      Full                                                        +---------+---------------+---------+-----------+----------+--------------+ PERO     Full                                                        +---------+---------------+---------+-----------+----------+--------------+   +---------+---------------+---------+-----------+----------+--------------+ LEFT     CompressibilityPhasicitySpontaneityPropertiesThrombus Aging +---------+---------------+---------+-----------+----------+--------------+ CFV      Full           Yes      Yes                                 +---------+---------------+---------+-----------+----------+--------------+ SFJ      Full                                                        +---------+---------------+---------+-----------+----------+--------------+ FV Prox  Full                                                        +---------+---------------+---------+-----------+----------+--------------+ FV Mid   Full                                                        +---------+---------------+---------+-----------+----------+--------------+ FV DistalFull                                                        +---------+---------------+---------+-----------+----------+--------------+ PFV      Full                                                        +---------+---------------+---------+-----------+----------+--------------+  POP      Full           Yes      Yes                                 +---------+---------------+---------+-----------+----------+--------------+ PTV      Full                                                        +---------+---------------+---------+-----------+----------+--------------+ PERO     Full                                                         +---------+---------------+---------+-----------+----------+--------------+     Summary: BILATERAL: - No evidence of deep vein thrombosis seen in the lower extremities, bilaterally.  RIGHT: - No cystic structure found in the popliteal fossa.  LEFT: - No cystic structure found in the popliteal fossa.  *See table(s) above for measurements and observations. Electronically signed by Curt Jews MD on 07/03/2019 at 8:18:32 AM.    Final    IR Paracentesis  Result Date: 07/01/2019 INDICATION: Patient with history of cirrhosis, recurrent ascites requiring frequent large volume paracentesis. Request to IR for diagnostic and therapeutic paracentesis with 10 L maximum. EXAM: ULTRASOUND GUIDED DIAGNOSTIC AND THERAPEUTIC PARACENTESIS MEDICATIONS: 10 mL 1% lidocaine COMPLICATIONS: None immediate. PROCEDURE: Informed written consent was obtained from the patient after a discussion of the risks, benefits and alternatives to treatment. A timeout was performed prior to the initiation of the procedure. Initial ultrasound scanning demonstrates a large amount of ascites within the right lower abdominal quadrant. The right lower abdomen was prepped and draped in the usual sterile fashion. 1% lidocaine was used for local anesthesia. Following this, a 19 gauge, 7-cm, Yueh catheter was introduced. An ultrasound image was saved for documentation purposes. The paracentesis was performed. The catheter was removed and a dressing was applied. The patient tolerated the procedure well without immediate post procedural complication. Patient received post-procedure intravenous albumin; see nursing notes for details. FINDINGS: A total of approximately 10.0 L of hazy yellow fluid was removed. Samples were sent to the laboratory as requested by the clinical team. IMPRESSION: Successful ultrasound-guided paracentesis yielding 10.0 liters of peritoneal fluid. Read by Candiss Norse, PA-C Electronically Signed   By: Jerilynn Mages.  Shick M.D.   On: 07/01/2019  13:27   IR Paracentesis  Result Date: 06/23/2019 INDICATION: Recurrent large volume symptomatic abdominal ascites EXAM: ULTRASOUND GUIDED  PARACENTESIS MEDICATIONS: None. COMPLICATIONS: None immediate. PROCEDURE: Informed written consent was obtained from the patient after a discussion of the risks, benefits and alternatives to treatment. A timeout was performed prior to the initiation of the procedure. Initial ultrasound scanning demonstrates a large amount of ascites within the right lower abdominal quadrant. The right lower abdomen was prepped and draped in the usual sterile fashion. 1% lidocaine was used for local anesthesia. Following this, a 6 Fr Safe-T-Centesis catheter was introduced. An ultrasound image was saved for documentation purposes. The paracentesis was performed. The catheter was removed and a dressing was applied. The patient tolerated the procedure well without immediate post procedural  complication. Patient received post-procedure intravenous albumin; see nursing notes for details. FINDINGS: A total of approximately 10 L of   clear yellow fluid was removed. IMPRESSION: Successful ultrasound-guided paracentesis yielding 10 liters of peritoneal fluid. Electronically Signed   By: Lucrezia Europe M.D.   On: 06/23/2019 08:10   IR Paracentesis  Result Date: 06/15/2019 INDICATION: Patient with history of cirrhosis, recurrent ascites. Request to IR for therapeutic paracentesis with 5 L maximum. EXAM: ULTRASOUND GUIDED THERAPEUTIC PARACENTESIS MEDICATIONS: 10 mL 1% lidocaine COMPLICATIONS: None immediate. PROCEDURE: Informed written consent was obtained from the patient after a discussion of the risks, benefits and alternatives to treatment. A timeout was performed prior to the initiation of the procedure. Initial ultrasound scanning demonstrates a large amount of ascites within the right lower abdominal quadrant. The right lower abdomen was prepped and draped in the usual sterile fashion. 1%  lidocaine was used for local anesthesia. Following this, a 19 gauge, 7-cm, Yueh catheter was introduced. An ultrasound image was saved for documentation purposes. The paracentesis was performed. The catheter was removed and a dressing was applied. The patient tolerated the procedure well without immediate post procedural complication. FINDINGS: A total of approximately 5.0 L of hazy pale yellow fluid was removed. IMPRESSION: Successful ultrasound-guided paracentesis yielding 5.0 liters of peritoneal fluid. Read by Candiss Norse, PA-C Electronically Signed   By: Markus Daft M.D.   On: 06/15/2019 14:56   IR Paracentesis  Result Date: 06/10/2019 INDICATION: Patient with history of cirrhosis, recurrent ascites. Request is made for diagnostic and therapeutic paracentesis up to 5 L maximum. EXAM: ULTRASOUND GUIDED DIAGNOSTIC AND THERAPEUTIC PARACENTESIS MEDICATIONS: 10 mL 1% lidocaine COMPLICATIONS: None immediate. PROCEDURE: Informed written consent was obtained from the patient after a discussion of the risks, benefits and alternatives to treatment. A timeout was performed prior to the initiation of the procedure. Initial ultrasound scanning demonstrates a large amount of ascites within the left lateral abdomen. The left lateral abdomen was prepped and draped in the usual sterile fashion. 1% lidocaine was used for local anesthesia. Following this, a 19 gauge, 7-cm, Yueh catheter was introduced. An ultrasound image was saved for documentation purposes. The paracentesis was performed. The catheter was removed and a dressing was applied. The patient tolerated the procedure well without immediate post procedural complication. FINDINGS: A total of approximately 5.0 liters of yellow fluid was removed. Samples were sent to the laboratory as requested by the clinical team. IMPRESSION: Successful ultrasound-guided diagnostic and therapeutic paracentesis yielding 5.0 liters of peritoneal fluid. Read by: Brynda Greathouse PA-C  Electronically Signed   By: Jerilynn Mages.  Shick M.D.   On: 06/10/2019 12:17    Micro Results     Recent Results (from the past 240 hour(s))  Respiratory Panel by RT PCR (Flu A&B, Covid) - Nasopharyngeal Swab     Status: Abnormal   Collection Time: 06/30/19  2:10 PM   Specimen: Nasopharyngeal Swab  Result Value Ref Range Status   SARS Coronavirus 2 by RT PCR POSITIVE (A) NEGATIVE Final    Comment: RESULT CALLED TO, READ BACK BY AND VERIFIED WITH: Chalmers Cater RN 16:00 06/30/19 ([email or web address]) (NOTE) SARS-CoV-2 target nucleic acids are DETECTED. SARS-CoV-2 RNA is generally detectable in upper respiratory specimens  during the acute phase of infection. Positive results are indicative of the presence of the identified virus, but do not rule out bacterial infection or co-infection with other pathogens not detected by the test. Clinical correlation with patient history and other diagnostic information is necessary to determine patient infection status.  The expected result is Negative. Fact Sheet for Patients:  PinkCheek.be Fact Sheet for Healthcare Providers: GravelBags.it This test is not yet approved or cleared by the [place] FDA and  has been authorized for detection and/or diagnosis of SARS-CoV-2 by FDA under an Emergency Use Authorization (EUA).  This EUA will remain in effect (meaning this test can be used) f or the duration of  the COVID-19 declaration under Section 564(b)(1) of the Act, 21 U.S.C. section 360bbb-3(b)(1), unless the authorization is terminated or revoked sooner.    Influenza A by PCR NEGATIVE NEGATIVE Final   Influenza B by PCR NEGATIVE NEGATIVE Final    Comment: (NOTE) The Xpert Xpress SARS-CoV-2/FLU/RSV assay is intended as an aid in  the diagnosis of influenza from Nasopharyngeal swab specimens and  should not be used as a sole basis for treatment. Nasal washings and  aspirates are unacceptable for Xpert Xpress  SARS-CoV-2/FLU/RSV  testing. Fact Sheet for Patients: PinkCheek.be Fact Sheet for Healthcare Providers: GravelBags.it This test is not yet approved or cleared by the [place] FDA and  has been authorized for detection and/or diagnosis of SARS-CoV-2 by  FDA under an Emergency Use Authorization (EUA). This EUA will remain  in effect (meaning this test can be used) for the duration of the  Covid-19 declaration under Section 564(b)(1) of the Act, 21  U.S.C. section 360bbb-3(b)(1), unless the authorization is  terminated or revoked. Performed at [facility], [place] [address] [place], [place] [address]     Today   Subjective    Shawn Martin today has no headache,no chest abdominal pain,no new weakness tingling or numbness, feels much better wants to go home today.     Objective   Blood pressure 105/68, pulse 89, temperature 97.6 F (36.4 C), temperature source Oral, resp. rate 18, height 6' (1.829 m), weight 95 kg, SpO2 99 %.   Intake/Output Summary (Last 24 hours) at 07/04/2019 1350 Last data filed at 07/04/2019 0848 Gross per 24 hour  Intake 240 ml  Output --  Net 240 ml    Exam Awake Alert, Oriented x 3, No new F.N deficits, Normal affect [place].AT,PERRAL Supple Neck,No JVD, No cervical lymphadenopathy appriciated.  Symmetrical Chest wall movement, Good air movement bilaterally, CTAB RRR,No Gallops,Rubs or new Murmurs, No Parasternal Heave +ve B.Sounds, Abd Soft with mild ascites, Non tender, No organomegaly appriciated, No rebound -guarding or rigidity. No Cyanosis, Clubbing or edema, No new Rash or bruise   Data Review   CBC w Diff:  Lab Results  Component Value Date   WBC 3.9 (L) 07/04/2019   HGB 10.7 (L) 07/04/2019   HCT 30.9 (L) 07/04/2019   PLT 129 (L) 07/04/2019   LYMPHOPCT 33 07/04/2019   MONOPCT 14 07/04/2019   EOSPCT 11 07/04/2019   BASOPCT 1 07/04/2019    CMP:  Lab Results   Component Value Date   NA 130 (L) 07/04/2019   K 4.3 07/04/2019   CL 99 07/04/2019   CO2 23 07/04/2019   BUN 10 07/04/2019   CREATININE 0.84 07/04/2019   PROT 4.9 (L) 07/04/2019   ALBUMIN 2.3 (L) 07/04/2019   BILITOT 1.3 (H) 07/04/2019   ALKPHOS 73 07/04/2019   AST 50 (H) 07/04/2019   ALT 29 07/04/2019  .   Total Time in preparing paper work, data evaluation and todays exam - 7 minutes  Lala Lund M.D on 07/04/2019 at 1:50 PM  [facility]   Office  [phone]

## 2019-07-04 NOTE — Progress Notes (Signed)
  Echocardiogram 2D Echocardiogram has been performed.  Jennette Dubin 07/04/2019, 1:25 PM

## 2019-07-04 NOTE — Care Management (Addendum)
Pt deemed stable for discharge home today. Px has extensive liver hx including weekly paracentesis performed as outpt here at [facility].   Pt confirms he is independent from home with his wife.  Pt denied all NC360 barriers.   Pt also confirms he doesn't have Insurance nor PCP - pt is interested in [facility] - see AVS (Clinic informed pt is covid positive). Pt informed that clinic appt will be tele-visit and not an in person appt. Pt informed CM that he pays for medications out of pocket - CM educated pt on good rx.   High Risk Admission assessment complete.   Discharge order will discharge time of 11 am -  CM reviewed chart for TOC needs/orders/consults - none determined.  CM signing off

## 2019-07-04 NOTE — Discharge Instructions (Signed)
Follow with Primary MD  in 7 days, keep your Hepatology appointment tomorrow with Dr Coralyn Pear  Get CBC, CMP, 2 view Chest X ray -  checked next visit within 1 week by Primary MD     Activity: As tolerated with Full fall precautions use walker/cane & assistance as needed  Disposition Home   Diet: Heart Healthy - 1.2 Lits/day total fluid restriction.   Special Instructions: If you have smoked or chewed Tobacco  in the last 2 yrs please stop smoking, stop any regular Alcohol  and or any Recreational drug use.  On your next visit with your primary care physician please Get Medicines reviewed and adjusted.  Please request your Prim.MD to go over all Hospital Tests and Procedure/Radiological results at the follow up, please get all Hospital records sent to your Prim MD by signing hospital release before you go home.  If you experience worsening of your admission symptoms, develop shortness of breath, life threatening emergency, suicidal or homicidal thoughts you must seek medical attention immediately by calling 911 or calling your MD immediately  if symptoms less severe.  You Must read complete instructions/literature along with all the possible adverse reactions/side effects for all the Medicines you take and that have been prescribed to you. Take any new Medicines after you have completely understood and accpet all the possible adverse reactions/side effects.        Person Under Monitoring Name: Shawn Martin  Location: [place] [address]   Infection Prevention Recommendations for Individuals Confirmed to have, or Being Evaluated for, 2019 Novel Coronavirus (COVID-19) Infection Who Receive Care at Home  Individuals who are confirmed to have, or are being evaluated for, COVID-19 should follow the prevention steps below until a healthcare provider or local or state health department says they can return to normal activities.  Stay home except to get medical  care You should restrict activities outside your home, except for getting medical care. Do not go to work, school, or public areas, and do not use public transportation or taxis.  Call ahead before visiting your doctor Before your medical appointment, call the healthcare provider and tell them that you have, or are being evaluated for, COVID-19 infection. This will help the healthcare providers office take steps to keep other people from getting infected. Ask your healthcare provider to call the local or state health department.  Monitor your symptoms Seek prompt medical attention if your illness is worsening (e.g., difficulty breathing). Before going to your medical appointment, call the healthcare provider and tell them that you have, or are being evaluated for, COVID-19 infection. Ask your healthcare provider to call the local or state health department.  Wear a facemask You should wear a facemask that covers your nose and mouth when you are in the same room with other people and when you visit a healthcare provider. People who live with or visit you should also wear a facemask while they are in the same room with you.  Separate yourself from other people in your home As much as possible, you should stay in a different room from other people in your home. Also, you should use a separate bathroom, if available.  Avoid sharing household items You should not share dishes, drinking glasses, cups, eating utensils, towels, bedding, or other items with other people in your home. After using these items, you should wash them thoroughly with soap and water.  Cover your coughs and sneezes Cover your mouth and nose  with a tissue when you cough or sneeze, or you can cough or sneeze into your sleeve. Throw used tissues in a lined trash can, and immediately wash your hands with soap and water for at least 20 seconds or use an alcohol-based hand rub.  Wash your [facility] your hands often and  thoroughly with soap and water for at least 20 seconds. You can use an alcohol-based hand sanitizer if soap and water are not available and if your hands are not visibly dirty. Avoid touching your eyes, nose, and mouth with unwashed hands.   Prevention Steps for Caregivers and Household Members of Individuals Confirmed to have, or Being Evaluated for, COVID-19 Infection Being Cared for in the Home  If you live with, or provide care at home for, a person confirmed to have, or being evaluated for, COVID-19 infection please follow these guidelines to prevent infection:  Follow healthcare providers instructions Make sure that you understand and can help the patient follow any healthcare provider instructions for all care.  Provide for the patients basic needs You should help the patient with basic needs in the home and provide support for getting groceries, prescriptions, and other personal needs.  Monitor the patients symptoms If they are getting sicker, call his or her medical provider and tell them that the patient has, or is being evaluated for, COVID-19 infection. This will help the healthcare providers office take steps to keep other people from getting infected. Ask the healthcare provider to call the local or state health department.  Limit the number of people who have contact with the patient  If possible, have only one caregiver for the patient.  Other household members should stay in another home or place of residence. If this is not possible, they should stay  in another room, or be separated from the patient as much as possible. Use a separate bathroom, if available.  Restrict visitors who do not have an essential need to be in the home.  Keep older adults, very young children, and other sick people away from the patient Keep older adults, very young children, and those who have compromised immune systems or chronic health conditions away from the patient. This  includes people with chronic heart, lung, or kidney conditions, diabetes, and cancer.  Ensure good ventilation Make sure that shared spaces in the home have good air flow, such as from an air conditioner or an opened window, weather permitting.  Wash your hands often  Wash your hands often and thoroughly with soap and water for at least 20 seconds. You can use an alcohol based hand sanitizer if soap and water are not available and if your hands are not visibly dirty.  Avoid touching your eyes, nose, and mouth with unwashed hands.  Use disposable paper towels to dry your hands. If not available, use dedicated cloth towels and replace them when they become wet.  Wear a facemask and gloves  Wear a disposable facemask at all times in the room and gloves when you touch or have contact with the patients blood, body fluids, and/or secretions or excretions, such as sweat, saliva, sputum, nasal mucus, vomit, urine, or feces.  Ensure the mask fits over your nose and mouth tightly, and do not touch it during use.  Throw out disposable facemasks and gloves after using them. Do not reuse.  Wash your hands immediately after removing your facemask and gloves.  If your personal clothing becomes contaminated, carefully remove clothing and launder. Wash your hands after  handling contaminated clothing.  Place all used disposable facemasks, gloves, and other waste in a lined container before disposing them with other household waste.  Remove gloves and wash your hands immediately after handling these items.  Do not share dishes, glasses, or other household items with the patient  Avoid sharing household items. You should not share dishes, drinking glasses, cups, eating utensils, towels, bedding, or other items with a patient who is confirmed to have, or being evaluated for, COVID-19 infection.  After the person uses these items, you should wash them thoroughly with soap and water.  Wash laundry  thoroughly  Immediately remove and wash clothes or bedding that have blood, body fluids, and/or secretions or excretions, such as sweat, saliva, sputum, nasal mucus, vomit, urine, or feces, on them.  Wear gloves when handling laundry from the patient.  Read and follow directions on labels of laundry or clothing items and detergent. In general, wash and dry with the warmest temperatures recommended on the label.  Clean all areas the individual has used often  Clean all touchable surfaces, such as counters, tabletops, doorknobs, bathroom fixtures, toilets, phones, keyboards, tablets, and bedside tables, every day. Also, clean any surfaces that may have blood, body fluids, and/or secretions or excretions on them.  Wear gloves when cleaning surfaces the patient has come in contact with.  Use a diluted bleach solution (e.g., dilute bleach with 1 part bleach and 10 parts water) or a household disinfectant with a label that says EPA-registered for coronaviruses. To make a bleach solution at home, add 1 tablespoon of bleach to 1 quart (4 cups) of water. For a larger supply, add  cup of bleach to 1 gallon (16 cups) of water.  Read labels of cleaning products and follow recommendations provided on product labels. Labels contain instructions for safe and effective use of the cleaning product including precautions you should take when applying the product, such as wearing gloves or eye protection and making sure you have good ventilation during use of the product.  Remove gloves and wash hands immediately after cleaning.  Monitor yourself for signs and symptoms of illness Caregivers and household members are considered close contacts, should monitor their health, and will be asked to limit movement outside of the home to the extent possible. Follow the monitoring steps for close contacts listed on the symptom monitoring form.   ? If you have additional questions, contact your local health department  or call the epidemiologist on call at [phone] (available 24/7). ? This guidance is subject to change. For the most up-to-date guidance from [facility], please refer to their website: YouBlogs.pl

## 2019-07-05 ENCOUNTER — Telehealth: Payer: Self-pay

## 2019-07-05 DIAGNOSIS — K7031 Alcoholic cirrhosis of liver with ascites: Secondary | ICD-10-CM

## 2019-07-05 NOTE — Telephone Encounter (Signed)
Thanks very much Sheri I appreciate it, this case is very complex.  If Hepatology is in agreement and recommends TIPS, then I would proceed with TIPS consult with IR and cancel the EGD. If they do not recommend TIPS I would recommend he proceed with the EGD. I can't see [facility] note in Epic, but if you have it in the office and they recommend TIPS then I would proceed with that, if you can help clarify. Thank you

## 2019-07-05 NOTE — Telephone Encounter (Signed)
Large vol paracentesis is scheduled for 07/12/19 at [facility].  Patient is advised he will need to arrive at 2:40 and check in through the ED.  He will remain in the ED waiting room until IR is ready for him.  He is advised he needs to come alone and his driver must remain in the car.  He is reminded he will need to have his mask on at all times.    He saw Roosevelt Locks today at [facility].  She is sending a note to Dr, Havery Moros about future plans for a TIPS consult.  She questions if the EGD with banding is necessary next week if the TIPS is performed.  He is scheduled for EGD with Dr. Bryan Lemma on Monday at [facility].  I attempted to inquire if they will perform the procedure with him being COVID positive, but they are gone for the day.  His quarantine period is over on Sunday according to what the health dept instructed him.  Dr. Havery Moros should he proceed with EGD/ banding?  I am not sure if the TIPS consult is ordered how long that would take to get him in and the TIPS performed.  He is instructed he does not need to go for the COVID screen on Thursday as he is already positive.  He will come for labs on Thursday 4/8 to the post- [place] clinic at [facility] for [facility].  Please advise on TIPS consult, EGD with banding for 4/12

## 2019-07-06 NOTE — Progress Notes (Signed)
Pt informed not show up for covid testing on 4/8, due to pt testing + for covid on 06/30/19. Based on the guidelines the pt is in the 90 day window to not retest. The pt is still expected to quarantine until their procedure. Therefore, the pt can still have the scheduled procedure. These are the guidelines as follows:  Guidance: Patient previously tested + COVID; now past 90 day window seeking elective surgery (asymptomatic)  Retest patient If negative, proceed with surgery If positive, postpone surgery for 10 days from positive test Patient to quarantine for the (10 days) Do not retest again prior to surgery (even if scheduled a couple of weeks out) Use standard precautions for surgery

## 2019-07-06 NOTE — Addendum Note (Signed)
Addended by: Marlon Pel on: 07/06/2019 04:37 PM   Modules accepted: Orders

## 2019-07-06 NOTE — Telephone Encounter (Signed)
Per Roosevelt Locks, RNP and patient is a good candidate for TIPS.  She feels safe to cancel EGD on Monday and proceed with IR consult for TIPS.  Discussed with Dr. Havery Moros who agrees with the plan.  EGD cancelled for Monday.  Patient notified of new plans and that he will be contacted directly by [facility] to schedule consult. Patient verbalized understanding of new plans.  Referral placed for IR consult for TIPS

## 2019-07-07 ENCOUNTER — Other Ambulatory Visit: Payer: Self-pay | Admitting: Nurse Practitioner

## 2019-07-07 ENCOUNTER — Ambulatory Visit: Payer: Self-pay

## 2019-07-07 ENCOUNTER — Other Ambulatory Visit: Payer: Self-pay | Admitting: Interventional Radiology

## 2019-07-07 ENCOUNTER — Other Ambulatory Visit: Payer: Self-pay

## 2019-07-07 ENCOUNTER — Ambulatory Visit
Admission: RE | Admit: 2019-07-07 | Discharge: 2019-07-07 | Disposition: A | Discharge status: Home / Self Care | Payer: Self-pay | Source: Ambulatory Visit | Attending: Gastroenterology | Admitting: Gastroenterology

## 2019-07-07 ENCOUNTER — Inpatient Hospital Stay (HOSPITAL_COMMUNITY)
Admission: RE | Admit: 2019-07-07 | Discharge: 2019-07-07 | Disposition: A | Discharge status: Home / Self Care | Payer: Self-pay | Source: Ambulatory Visit

## 2019-07-07 DIAGNOSIS — K7031 Alcoholic cirrhosis of liver with ascites: Secondary | ICD-10-CM

## 2019-07-07 HISTORY — PX: IR RADIOLOGIST EVAL & MGMT: IMG5224

## 2019-07-07 NOTE — Progress Notes (Signed)
Patient walked in stating that Dr.Armbruster sent him for GI labs. Patient was recently discharged from hospital for Alcoholic cirrhosis of the liver and tested positive for COVID while admitted. Lab work was completed per GI office request.

## 2019-07-07 NOTE — Progress Notes (Unsigned)
Patient walked in stating that Dr.Armbruster sent him for GI labs. Patient was recently discharged from hospital for Alcoholic cirrhosis of the liver and tested positive for COVID while admitted. Lab work was completed per GI office request.

## 2019-07-07 NOTE — Consult Note (Signed)
Chief Complaint: Patient was consulted remotely today (TeleHealth) for TIPS at the request of Armbruster,Steven P.    Referring Physician(s): Armbruster,Steven P  History of Present Illness: Shawn Martin is a 44 y.o. male with a history of alcoholic cirrhosis, celiac disease, esophageal varices, portal hypertensive gastropathy and refractory ascites. He is status post multiple large volume paracentesis procedures since last November and has had several procedures in March and one on 07/03/19. He has a history of heavy alcohol use but states he has not had any alcohol for about 2-3 months. Labs showed positive ANA, ASMA and elevated IgG. Hepatitis panel was negative.  A transjugular liver biopsy on 06/22/19 showed cirrhosis (stage 4 of 4). He is followed by Dr. Havery Moros and Roosevelt Locks, NP at [facility]. 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 He has not had a documented variceal bleed in the past. Liver function has improved after discontinuation of alcohol. He has never had any signs or symptoms of encephalopathy.  Due to current low MELD-Na score of 10 and <6 month alcohol abstinence, he does not currently qualify for liver transplantation and is also currently uninsured. He has not tolerated increasing diuretic doses well due to hyponatremia and AKI. He has been evaluated by [facility] and deemed a good candidate for TIPS currently to treat his refractory ascites.  Past Medical History:  Diagnosis Date  . Celiac disease   . Hepatic cirrhosis ([place])   . Hypertension     Past Surgical History:  Procedure Laterality Date  . IR PARACENTESIS  02/07/2019  . IR PARACENTESIS  05/25/2019  . IR PARACENTESIS  06/10/2019  . IR PARACENTESIS  06/15/2019  . IR PARACENTESIS  06/22/2019  . IR PARACENTESIS  07/01/2019  . IR TRANSCATHETER BX  06/22/2019  . IR US  GUIDE VASC ACCESS RIGHT  06/22/2019  . IR VENOGRAM HEPATIC WO HEMODYNAMIC EVALUATION  06/22/2019  . NOSE SURGERY      Allergies: Gluten meal  Medications: Prior to Admission medications   Medication Sig Start Date End Date Taking? Authorizing Provider  diphenhydramine-acetaminophen (TYLENOL PM) 25-500 MG TABS tablet Take 1 tablet by mouth at bedtime as needed (sleep).    [provider]  furosemide (LASIX) 40 MG tablet Take 1 tablet (40 mg total) by mouth 2 (two) times daily. 07/04/19   Thurnell Lose, MD  magnesium gluconate (MAGONATE) 500 MG tablet Take 500 mg by mouth daily as needed (cramps).    [provider]  Multiple Vitamin (MULTIVITAMIN WITH MINERALS) TABS tablet Take 1 tablet by mouth daily.    [provider]  spironolactone (ALDACTONE) 100 MG tablet Take 1 tablet (100 mg total) by mouth 2 (two) times daily. 07/04/19   Thurnell Lose, MD     Family History  Problem Relation Age of Onset  . Healthy Mother   . Hypertension Father   . Heart attack Father   . Leukemia Brother   . Colon cancer Neg Hx   . Stomach cancer Neg Hx   . Pancreatic cancer Neg Hx     Social History   Socioeconomic History  . Marital status: Married    Spouse name: Not on file  . Number of children: Not on file  . Years of education: Not on file  . Highest education level: Not on file  Occupational History  . Not on file  Tobacco Use  . Smoking status: Never Smoker  . Smokeless tobacco: Former Systems developer    Types: Chew  Substance and Sexual Activity  . Alcohol use: Not Currently    Comment: every day drinker, 6 pack per day  . Drug use: Never  . Sexual activity: Yes    Partners: Female  Other Topics Concern  . Not on file  Social History Narrative  . Not on file   Social Determinants of Health   Financial Resource Strain:   . Difficulty of Paying Living Expenses:   Food Insecurity:   . Worried About Charity fundraiser in the Last Year:   . Arboriculturist  in the Last Year:   Transportation Needs:   . Film/video editor (Medical):   Marland Kitchen Lack of Transportation (Non-Medical):   Physical Activity:   . Days of Exercise per Week:   . Minutes of Exercise per Session:   Stress:   . Feeling of Stress :   Social Connections:   . Frequency of Communication with Friends and Family:   . Frequency of Social Gatherings with Friends and Family:   . Attends Religious Services:   . Active Member of Clubs or Organizations:   . Attends Archivist Meetings:   Marland Kitchen Marital Status:      Review of Systems  Constitutional: Positive for activity change.  Respiratory: Negative.   Cardiovascular: Negative.   Gastrointestinal: Positive for abdominal distention. Negative for blood in stool, diarrhea, nausea and vomiting.  Genitourinary: Negative.   Musculoskeletal: Negative.   Neurological: Negative.   Hematological: Negative.     Review of Systems: A 12 point ROS discussed and pertinent positives are indicated in the HPI above.  All other systems are negative.  Physical Exam No direct physical exam was performed (except for noted visual exam findings with Video Visits).   Vital Signs: There were no vitals taken for this visit.  Imaging: IR Venogram Hepatic Wo Hemodynamic Evaluation  Result Date: 06/22/2019 CLINICAL DATA:  Presumed cirrhosis, recurrent large volume abdominal ascites. EXAM: HEPATIC VENOGRAM TRANSJUGULAR LIVER BIOPSY FLUOROSCOPY TIME:  7.2 minutes; 2441  uGym2 DAP PROCEDURE: The procedure, risks (including but not limited to bleeding, infection, organ damage ), benefits, and alternatives were explained to the patient. Questions regarding the procedure were encouraged and answered. The patient understands and consents to the procedure. Large volume paracentesis was initiated immediately preceding this procedure, dictated separately. Patency of the right IJ vein was confirmed with ultrasound with image documentation. An appropriate  skin site was determined. Skin site was marked, prepped with chlorhexidine, and draped using maximum barrier technique. The region was infiltrated locally with 1% lidocaine. Intravenous Fentanyl 17mg and Versed 258mwere administered as conscious sedation during continuous monitoring of the patient's level of consciousness and physiological / cardiorespiratory status by the radiology RN, with a total moderate sedation time of 20 minutes. Under real-time ultrasound guidance, the right IJ vein was accessed with a 21 gauge micropuncture needle; the needle tip within the vein was confirmed with ultrasound image documentation. . Needle was exchanged over guidewire for transitional dilator, which allowed passage of a J wire into the IVC. Over this, a 5 FrPakistanngiographic catheter was advanced and used to selectively catheterize the right hepatic vein. Confirmatory venography was performed. A 9 French angled sheath was advanced into the right hepatic vein. The catheter was exchanged over the guidewire for the delivery sheath of the transjugular core biopsy needle. Multiple 18-gauge core liver biopsy  samples were obtained. The sheath was removed and hemostasis achieved at the site. Patient tolerated procedure well, with no immediate complication. IMPRESSION: 1. Right hepatic venogram confirms appropriate catheter position 2. Technically successful transjugular core liver biopsy . Electronically Signed   By: Lucrezia Europe M.D.   On: 06/22/2019 17:25   IR Transcatheter BX  Result Date: 06/22/2019 CLINICAL DATA:  Presumed cirrhosis, recurrent large volume abdominal ascites. EXAM: HEPATIC VENOGRAM TRANSJUGULAR LIVER BIOPSY FLUOROSCOPY TIME:  7.2 minutes; 2441  uGym2 DAP PROCEDURE: The procedure, risks (including but not limited to bleeding, infection, organ damage ), benefits, and alternatives were explained to the patient. Questions regarding the procedure were encouraged and answered. The patient understands and consents  to the procedure. Large volume paracentesis was initiated immediately preceding this procedure, dictated separately. Patency of the right IJ vein was confirmed with ultrasound with image documentation. An appropriate skin site was determined. Skin site was marked, prepped with chlorhexidine, and draped using maximum barrier technique. The region was infiltrated locally with 1% lidocaine. Intravenous Fentanyl 170mg and Versed 26mwere administered as conscious sedation during continuous monitoring of the patient's level of consciousness and physiological / cardiorespiratory status by the radiology RN, with a total moderate sedation time of 20 minutes. Under real-time ultrasound guidance, the right IJ vein was accessed with a 21 gauge micropuncture needle; the needle tip within the vein was confirmed with ultrasound image documentation. . Needle was exchanged over guidewire for transitional dilator, which allowed passage of a J wire into the IVC. Over this, a 5 FrPakistanngiographic catheter was advanced and used to selectively catheterize the right hepatic vein. Confirmatory venography was performed. A 9 French angled sheath was advanced into the right hepatic vein. The catheter was exchanged over the guidewire for the delivery sheath of the transjugular core biopsy needle. Multiple 18-gauge core liver biopsy samples were obtained. The sheath was removed and hemostasis achieved at the site. Patient tolerated procedure well, with no immediate complication. IMPRESSION: 1. Right hepatic venogram confirms appropriate catheter position 2. Technically successful transjugular core liver biopsy . Electronically Signed   By: D Lucrezia Europe.D.   On: 06/22/2019 17:25   [place]acentesis  Result Date: 07/03/2019 INDICATION: Recurrent large volume ascites secondary to cirrhosis. Request for therapeutic paracentesis. EXAM: ULTRASOUND GUIDED PARACENTESIS MEDICATIONS: 1% lidocaine 10 mL COMPLICATIONS: None immediate. PROCEDURE:  Informed written consent was obtained from the patient after a discussion of the risks, benefits and alternatives to treatment. A timeout was performed prior to the initiation of the procedure. Initial ultrasound scanning demonstrates a large amount of ascites within the left lateral abdomen. The left lateral abdomen was prepped and draped in the usual sterile fashion. 1% lidocaine was used for local anesthesia. Following this, a 19 gauge, 7-cm, Yueh catheter was introduced. An ultrasound image was saved for documentation purposes. The paracentesis was performed. The catheter was removed and a dressing was applied. The patient tolerated the procedure well without immediate post procedural complication. Patient received post-procedure intravenous albumin; see nursing notes for details. FINDINGS: A total of approximately 8.4 L of clear yellow fluid was removed. IMPRESSION: Successful ultrasound-guided paracentesis yielding 8.4 liters of peritoneal fluid. Read by: WeGareth EaglePA-C Electronically Signed   By: M.Jerilynn Mages Shick M.D.   On: 07/03/2019 12:29   IR US[place]ore[place]ide Vasc Access Right  Result Date: 06/22/2019 CLINICAL DATA:  Presumed cirrhosis, recurrent large volume abdominal ascites. EXAM: HEPATIC VENOGRAM TRANSJUGULAR LIVER BIOPSY FLUOROSCOPY TIME:  7.2 minutes; 2441  uGym2 DAP PROCEDURE: The  procedure, risks (including but not limited to bleeding, infection, organ damage ), benefits, and alternatives were explained to the patient. Questions regarding the procedure were encouraged and answered. The patient understands and consents to the procedure. Large volume paracentesis was initiated immediately preceding this procedure, dictated separately. Patency of the right IJ vein was confirmed with ultrasound with image documentation. An appropriate skin site was determined. Skin site was marked, prepped with chlorhexidine, and draped using maximum barrier technique. The region was infiltrated locally with 1% lidocaine.  Intravenous Fentanyl 122mg and Versed 253mwere administered as conscious sedation during continuous monitoring of the patient's level of consciousness and physiological / cardiorespiratory status by the radiology RN, with a total moderate sedation time of 20 minutes. Under real-time ultrasound guidance, the right IJ vein was accessed with a 21 gauge micropuncture needle; the needle tip within the vein was confirmed with ultrasound image documentation. . Needle was exchanged over guidewire for transitional dilator, which allowed passage of a J wire into the IVC. Over this, a 5 FrPakistanngiographic catheter was advanced and used to selectively catheterize the right hepatic vein. Confirmatory venography was performed. A 9 French angled sheath was advanced into the right hepatic vein. The catheter was exchanged over the guidewire for the delivery sheath of the transjugular core biopsy needle. Multiple 18-gauge core liver biopsy samples were obtained. The sheath was removed and hemostasis achieved at the site. Patient tolerated procedure well, with no immediate complication. IMPRESSION: 1. Right hepatic venogram confirms appropriate catheter position 2. Technically successful transjugular core liver biopsy . Electronically Signed   By: D Lucrezia Europe.D.   On: 06/22/2019 17:25   DG Chest Portable 1 View  Result Date: 06/30/2019 CLINICAL DATA:  Shortness of breath EXAM: PORTABLE CHEST 1 VIEW COMPARISON:  None. FINDINGS: Note shallow degree of inspiration. There is a rather minimal pleural effusion on each side. There is slight bibasilar atelectasis. The lungs elsewhere are clear. Heart size and pulmonary vascularity are normal. No adenopathy. No bone lesions. IMPRESSION: Rather minimal pleural effusions bilaterally with bibasilar atelectasis. Lungs elsewhere clear. Cardiac silhouette normal. Electronically Signed   By: WiLowella GripII M.D.   On: 06/30/2019 13:23   VAS US[place]OWER EXTREMITY VENOUS (DVT)  Result  Date: 07/03/2019  Lower Venous DVTStudy Indications: Swelling.  Comparison Study: No prior exam. Performing Technologist: KrBaldwin CrownRDMS, RVT  Examination Guidelines: A complete evaluation includes B-mode imaging, spectral Doppler, color Doppler, and power Doppler as needed of all accessible portions of each vessel. Bilateral testing is considered an integral part of a complete examination. Limited examinations for reoccurring indications may be performed as noted. The reflux portion of the exam is performed with the patient in reverse Trendelenburg.  +---------+---------------+---------+-----------+----------+--------------+ RIGHT    CompressibilityPhasicitySpontaneityPropertiesThrombus Aging +---------+---------------+---------+-----------+----------+--------------+ CFV      Full           Yes      Yes                                 +---------+---------------+---------+-----------+----------+--------------+ SFJ      Full                                                        +---------+---------------+---------+-----------+----------+--------------+ FV Prox  Full                                                        +---------+---------------+---------+-----------+----------+--------------+  FV Mid   Full                                                        +---------+---------------+---------+-----------+----------+--------------+ FV DistalFull                                                        +---------+---------------+---------+-----------+----------+--------------+ PFV      Full                                                        +---------+---------------+---------+-----------+----------+--------------+ POP      Full           Yes      Yes                                 +---------+---------------+---------+-----------+----------+--------------+ PTV      Full                                                         +---------+---------------+---------+-----------+----------+--------------+ PERO     Full                                                        +---------+---------------+---------+-----------+----------+--------------+   +---------+---------------+---------+-----------+----------+--------------+ LEFT     CompressibilityPhasicitySpontaneityPropertiesThrombus Aging +---------+---------------+---------+-----------+----------+--------------+ CFV      Full           Yes      Yes                                 +---------+---------------+---------+-----------+----------+--------------+ SFJ      Full                                                        +---------+---------------+---------+-----------+----------+--------------+ FV Prox  Full                                                        +---------+---------------+---------+-----------+----------+--------------+ FV Mid   Full                                                        +---------+---------------+---------+-----------+----------+--------------+  FV DistalFull                                                        +---------+---------------+---------+-----------+----------+--------------+ PFV      Full                                                        +---------+---------------+---------+-----------+----------+--------------+ POP      Full           Yes      Yes                                 +---------+---------------+---------+-----------+----------+--------------+ PTV      Full                                                        +---------+---------------+---------+-----------+----------+--------------+ PERO     Full                                                        +---------+---------------+---------+-----------+----------+--------------+     Summary: BILATERAL: - No evidence of deep vein thrombosis seen in the lower extremities, bilaterally.  RIGHT: - No  cystic structure found in the popliteal fossa.  LEFT: - No cystic structure found in the popliteal fossa.  *See table(s) above for measurements and observations. Electronically signed by Curt Jews MD on 07/03/2019 at 8:18:32 AM.    Final    ECHOCARDIOGRAM LIMITED  Result Date: 07/04/2019    ECHOCARDIOGRAM LIMITED REPORT   Patient Name:   Issaih FAIN Martin Date of Exam: 07/04/2019 Medical Rec #:  [id number]            Height:       72.0 in Accession #:    [id number]           Weight:       209.4 lb Date of Birth:  December 15, 1975             BSA:          2.173 m Patient Age:    37 years             BP:           105/68 mmHg Patient Gender: M                    HR:           83 bpm. Exam Location:  Inpatient Procedure: Limited Echo, Limited Color Doppler and Cardiac Doppler Indications:    CHF-Acute Diastolic N17.00  History:        Patient has no prior history of Echocardiogram examinations.                 Hepatic cirrhosis; Risk Factors:Hypertension. COVID-19  Positive.  Sonographer:    Mikki Santee RDCS (AE) Referring Phys: [id number]intosh [place]  1. The LV is hyperdynamic with LVEF 70-75%. There is an intracavitary gradient of 12 mmHG. There is chordal SAM. There is no asymmetric LVH to suggest hypertrophic cardiomyopathy. These findings are related to the hyperdynamic LV. There is pseudodyskinesis of the inferior wall, likely related to increased intraabdominal pressure in this patient with liver disease (normal finding). Left ventricular ejection fraction, by estimation, is 70 to 75%. The left ventricle has hyperdynamic function.  The left ventricle has no regional wall motion abnormalities. Left ventricular diastolic parameters were normal.  2. Right ventricular systolic function is normal. The right ventricular size is mildly enlarged. There is normal pulmonary artery systolic pressure. The estimated right ventricular systolic pressure is 58.0 mmHg.  3. The mitral valve is grossly normal. Mild  mitral valve regurgitation. No evidence of mitral stenosis.  4. The aortic valve is tricuspid. Aortic valve regurgitation is not visualized. No aortic stenosis is present.  5. The inferior vena cava is normal in size with <50% respiratory variability, suggesting right atrial pressure of 8 mmHg. FINDINGS  Left Ventricle: The LV is hyperdynamic with LVEF 70-75%. There is an intracavitary gradient of 12 mmHG. There is chordal SAM. There is no asymmetric LVH to suggest hypertrophic cardiomyopathy. These findings are related to the hyperdynamic LV. There is pseudodyskinesis of the inferior wall, likely related to increased intraabdominal pressure in this patient with liver disease (normal finding). Left ventricular ejection fraction, by estimation, is 70 to 75%. The left ventricle has hyperdynamic function.  The left ventricle has no regional wall motion abnormalities. The left ventricular internal cavity size was normal in size. There is no left ventricular hypertrophy. Normal left ventricular filling pressure. Right Ventricle: The right ventricular size is mildly enlarged. No increase in right ventricular wall thickness. Right ventricular systolic function is normal. There is normal pulmonary artery systolic pressure. The tricuspid regurgitant velocity is 2.05  m/s, and with an assumed right atrial pressure of 8 mmHg, the estimated right ventricular systolic pressure is 99.8 mmHg. Left Atrium: Left atrial size was normal in size. Right Atrium: Right atrial size was normal in size. Pericardium: There is no evidence of pericardial effusion. Mitral Valve: The mitral valve is grossly normal. Mild mitral valve regurgitation. No evidence of mitral valve stenosis. Tricuspid Valve: The tricuspid valve is grossly normal. Tricuspid valve regurgitation is trivial. No evidence of tricuspid stenosis. Aortic Valve: The aortic valve is tricuspid. Aortic valve regurgitation is not visualized. No aortic stenosis is present. Pulmonic  Valve: The pulmonic valve was grossly normal. Pulmonic valve regurgitation is not visualized. Venous: The inferior vena cava is normal in size with less than 50% respiratory variability, suggesting right atrial pressure of 8 mmHg. Additional Comments: There is a small pleural effusion in the left lateral region.  LEFT VENTRICLE PLAX 2D LVIDd:         4.20 cm  Diastology LVIDs:         2.60 cm  LV e' lateral:   13.10 cm/s LV PW:         1.00 cm  LV E/e' lateral: 5.6 LV IVS:        1.00 cm  LV e' medial:    8.92 cm/s LVOT diam:     2.40 cm  LV E/e' medial:  8.3 LV SV:         161 LV SV Index:   74 LVOT Area:  4.52 cm  LEFT ATRIUM             Index       RIGHT ATRIUM           Index LA diam:        3.90 cm 1.79 cm/m  RA Area:     15.30 cm LA Vol (A2C):   36.0 ml 16.57 ml/m RA Volume:   32.30 ml  14.87 ml/m LA Vol (A4C):   44.1 ml 20.30 ml/m LA Biplane Vol: 43.9 ml 20.21 ml/m  AORTIC VALVE LVOT Vmax:   155.00 cm/s LVOT Vmean:  104.000 cm/s LVOT VTI:    0.356 m  AORTA Ao Root diam: 3.20 cm MITRAL VALVE               TRICUSPID VALVE MV Area (PHT): 2.20 cm    TR Peak grad:   16.8 mmHg MV Decel Time: 345 msec    TR Vmax:        205.00 cm/s MR Peak grad: 112.4 mmHg MR Vmax:      530.00 cm/s  SHUNTS MV E velocity: 73.70 cm/s  Systemic VTI:  0.36 m MV A velocity: 62.60 cm/s  Systemic Diam: 2.40 cm MV E/A ratio:  1.18 Eleonore Chiquito MD Electronically signed by Eleonore Chiquito MD Signature Date/Time: 07/04/2019/3:46:56 PM    Final    IR Paracentesis  Result Date: 07/01/2019 INDICATION: Patient with history of cirrhosis, recurrent ascites requiring frequent large volume paracentesis. Request to IR for diagnostic and therapeutic paracentesis with 10 L maximum. EXAM: ULTRASOUND GUIDED DIAGNOSTIC AND THERAPEUTIC PARACENTESIS MEDICATIONS: 10 mL 1% lidocaine COMPLICATIONS: None immediate. PROCEDURE: Informed written consent was obtained from the patient after a discussion of the risks, benefits and alternatives to treatment.  A timeout was performed prior to the initiation of the procedure. Initial ultrasound scanning demonstrates a large amount of ascites within the right lower abdominal quadrant. The right lower abdomen was prepped and draped in the usual sterile fashion. 1% lidocaine was used for local anesthesia. Following this, a 19 gauge, 7-cm, Yueh catheter was introduced. An ultrasound image was saved for documentation purposes. The paracentesis was performed. The catheter was removed and a dressing was applied. The patient tolerated the procedure well without immediate post procedural complication. Patient received post-procedure intravenous albumin; see nursing notes for details. FINDINGS: A total of approximately 10.0 L of hazy yellow fluid was removed. Samples were sent to the laboratory as requested by the clinical team. IMPRESSION: Successful ultrasound-guided paracentesis yielding 10.0 liters of peritoneal fluid. Read by Candiss Norse, PA-C Electronically Signed   By: Jerilynn Mages.  Shick M.D.   On: 07/01/2019 13:27   IR Paracentesis  Result Date: 06/23/2019 INDICATION: Recurrent large volume symptomatic abdominal ascites EXAM: ULTRASOUND GUIDED  PARACENTESIS MEDICATIONS: None. COMPLICATIONS: None immediate. PROCEDURE: Informed written consent was obtained from the patient after a discussion of the risks, benefits and alternatives to treatment. A timeout was performed prior to the initiation of the procedure. Initial ultrasound scanning demonstrates a large amount of ascites within the right lower abdominal quadrant. The right lower abdomen was prepped and draped in the usual sterile fashion. 1% lidocaine was used for local anesthesia. Following this, a 6 Fr Safe-T-Centesis catheter was introduced. An ultrasound image was saved for documentation purposes. The paracentesis was performed. The catheter was removed and a dressing was applied. The patient tolerated the procedure well without immediate post procedural complication.  Patient received post-procedure intravenous albumin; see nursing notes for details. FINDINGS: A total  of approximately 10 L of   clear yellow fluid was removed. IMPRESSION: Successful ultrasound-guided paracentesis yielding 10 liters of peritoneal fluid. Electronically Signed   By: Lucrezia Europe M.D.   On: 06/23/2019 08:10   IR Paracentesis  Result Date: 06/15/2019 INDICATION: Patient with history of cirrhosis, recurrent ascites. Request to IR for therapeutic paracentesis with 5 L maximum. EXAM: ULTRASOUND GUIDED THERAPEUTIC PARACENTESIS MEDICATIONS: 10 mL 1% lidocaine COMPLICATIONS: None immediate. PROCEDURE: Informed written consent was obtained from the patient after a discussion of the risks, benefits and alternatives to treatment. A timeout was performed prior to the initiation of the procedure. Initial ultrasound scanning demonstrates a large amount of ascites within the right lower abdominal quadrant. The right lower abdomen was prepped and draped in the usual sterile fashion. 1% lidocaine was used for local anesthesia. Following this, a 19 gauge, 7-cm, Yueh catheter was introduced. An ultrasound image was saved for documentation purposes. The paracentesis was performed. The catheter was removed and a dressing was applied. The patient tolerated the procedure well without immediate post procedural complication. FINDINGS: A total of approximately 5.0 L of hazy pale yellow fluid was removed. IMPRESSION: Successful ultrasound-guided paracentesis yielding 5.0 liters of peritoneal fluid. Read by Candiss Norse, PA-C Electronically Signed   By: Markus Daft M.D.   On: 06/15/2019 14:56   IR Paracentesis  Result Date: 06/10/2019 INDICATION: Patient with history of cirrhosis, recurrent ascites. Request is made for diagnostic and therapeutic paracentesis up to 5 L maximum. EXAM: ULTRASOUND GUIDED DIAGNOSTIC AND THERAPEUTIC PARACENTESIS MEDICATIONS: 10 mL 1% lidocaine COMPLICATIONS: None immediate. PROCEDURE:  Informed written consent was obtained from the patient after a discussion of the risks, benefits and alternatives to treatment. A timeout was performed prior to the initiation of the procedure. Initial ultrasound scanning demonstrates a large amount of ascites within the left lateral abdomen. The left lateral abdomen was prepped and draped in the usual sterile fashion. 1% lidocaine was used for local anesthesia. Following this, a 19 gauge, 7-cm, Yueh catheter was introduced. An ultrasound image was saved for documentation purposes. The paracentesis was performed. The catheter was removed and a dressing was applied. The patient tolerated the procedure well without immediate post procedural complication. FINDINGS: A total of approximately 5.0 liters of yellow fluid was removed. Samples were sent to the laboratory as requested by the clinical team. IMPRESSION: Successful ultrasound-guided diagnostic and therapeutic paracentesis yielding 5.0 liters of peritoneal fluid. Read by: Brynda Greathouse PA-C Electronically Signed   By: Jerilynn Mages.  Shick M.D.   On: 06/10/2019 12:17    Labs:  CBC: Recent Labs    07/01/19 0528 07/02/19 0314 07/03/19 0620 07/04/19 0235  WBC 5.2 4.1 4.3 3.9*  HGB 10.7* 10.5* 11.0* 10.7*  HCT 30.0* 30.2* 32.4* 30.9*  PLT 149* 134* 151 129*    COAGS: Recent Labs    06/30/19 1420 06/30/19 1420 07/01/19 0818 07/02/19 0314 07/03/19 0620 07/04/19 0235  INR 1.2   < > 1.2 1.2 1.3* 1.3*  APTT 34  --   --   --   --   --    < > = values in this interval not displayed.    BMP: Recent Labs    07/02/19 0314 07/02/19 1340 07/03/19 0620 07/04/19 0235  NA 134*  134* 130* 134* 130*  K 4.7  4.7 3.9 4.4 4.3  CL 99  99 96* 100 99  CO2 27  27 25 27 23   GLUCOSE 100*  101* 107* 102* 96  BUN 17  17  14 10 10   CALCIUM 9.0  8.9 8.6* 8.4* 8.1*  CREATININE 1.24  1.24 1.07 1.07 0.84  GFRNONAA >60  >60 >60 >60 >60  GFRAA >60  >60 >60 >60 >60    LIVER FUNCTION TESTS: Recent Labs     07/01/19 0528 07/02/19 0314 07/03/19 0620 07/04/19 0235  BILITOT 2.4* 1.9* 1.4* 1.3*  AST 61* 51* 59* 50*  ALT 34 29 32 29  ALKPHOS 83 73 81 73  PROT 6.0* 5.7* 5.4* 4.9*  ALBUMIN 2.3* 2.7*  2.7* 2.4* 2.3*    TUMOR MARKERS: Recent Labs    01/28/19 1558  AFPTM 4.3    Assessment and Plan:  I spoke with [name] and discussed possible TIPS with him. His wife was also on the phone call. We discussed details of TIPS, risks of the procedure, and possible outcomes. He currently has a MELD-Na score of 10 but has had some more significant lab abnormalities in the past that appear to be improving. He is very eager to improve his ascites as it affects his daily activities, ability to breath and appetite. 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 I reviewed prior ultrasound studies in 2020 that appear to show flow in the portal vein, but the studies are not very good and the portal vein appears potentially small with slow flow. He will need a good CT evaluation of the abdomen with multi-phase imaging to evaluate his anatomy prior to TIPS. We will schedule a CT. Should his CT demonstrate amenable anatomy and pre-op labs show stable MELD score, I told [name] that we should be able to schedule a TIPS procedure in the next few weeks. He may need one or more interval paracentesis procedures in the meantime. He would like to proceed.  Thank you for this interesting consult.  I greatly enjoyed meeting Ranald Fain Martin and look forward to participating in their care.  A copy of this report was sent to the requesting provider on this date.  Electronically Signed: Azzie Roup 07/07/2019, 3:47 PM     I spent a total of 40 Minutes  in remote  clinical consultation, greater than 50% of which was counseling/coordinating care for possible TIPS placement.    Visit type: Audio only (telephone).  Audio (no video) only due to patient's lack of internet/smartphone capability. Alternative for in-person consultation at [facility], [address] [place], [place], [place]. This visit type was conducted due to national recommendations for restrictions regarding the COVID-19 Pandemic (e.g. social distancing).  This format is felt to be most appropriate for this patient at this time.  All issues noted in this document were discussed and addressed.

## 2019-07-08 ENCOUNTER — Other Ambulatory Visit: Payer: Self-pay

## 2019-07-08 DIAGNOSIS — K7031 Alcoholic cirrhosis of liver with ascites: Secondary | ICD-10-CM

## 2019-07-08 LAB — BASIC METABOLIC PANEL
BUN/Creatinine Ratio: 18 (ref 9–20)
BUN: 18 mg/dL (ref 6–24)
CO2: 24 mmol/L (ref 20–29)
Calcium: 8.4 mg/dL — ABNORMAL LOW (ref 8.7–10.2)
Chloride: 98 mmol/L (ref 96–106)
Creatinine, Ser: 1.01 mg/dL (ref 0.76–1.27)
GFR calc Af Amer: 103 mL/min/{1.73_m2} (ref >59)
GFR calc non Af Amer: 89 mL/min/{1.73_m2} (ref >59)
Glucose: 93 mg/dL (ref 65–99)
Potassium: 5.4 mmol/L — ABNORMAL HIGH (ref 3.5–5.2)
Sodium: 133 mmol/L — ABNORMAL LOW (ref 134–144)

## 2019-07-08 LAB — MAGNESIUM: Magnesium: 2.2 mg/dL (ref 1.6–2.3)

## 2019-07-08 MED ORDER — SPIRONOLACTONE 100 MG PO TABS: 100.0000 mg | ORAL_TABLET | Freq: Every day | ORAL | 0 refills | Status: DC

## 2019-07-11 ENCOUNTER — Ambulatory Visit (HOSPITAL_COMMUNITY): Admit: 2019-07-11 | Payer: Self-pay | Admitting: Gastroenterology

## 2019-07-11 ENCOUNTER — Telehealth: Payer: Self-pay | Admitting: Nurse Practitioner

## 2019-07-11 SURGERY — ESOPHAGOGASTRODUODENOSCOPY (EGD) WITH PROPOFOL
Anesthesia: Monitor Anesthesia Care

## 2019-07-11 NOTE — Telephone Encounter (Signed)
Patient called requesting that we let the radiology dept know to please empty all fluids has an appt for tomorrow.

## 2019-07-11 NOTE — Telephone Encounter (Signed)
I think the patient is going for a placement of TIPS. This is done in the hospital by IR.  The provider there will give the patient instructions on using it to release fluid.  Patient last spoke to [place], [place] when this was scheduled.

## 2019-07-12 ENCOUNTER — Ambulatory Visit (HOSPITAL_COMMUNITY)
Admission: RE | Admit: 2019-07-12 | Discharge: 2019-07-12 | Disposition: A | Discharge status: Home / Self Care | Payer: Self-pay | Source: Ambulatory Visit | Attending: Gastroenterology | Admitting: Gastroenterology

## 2019-07-12 ENCOUNTER — Other Ambulatory Visit (HOSPITAL_COMMUNITY): Payer: Self-pay | Admitting: Interventional Radiology

## 2019-07-12 ENCOUNTER — Other Ambulatory Visit (HOSPITAL_COMMUNITY): Payer: Self-pay

## 2019-07-12 ENCOUNTER — Emergency Department (HOSPITAL_COMMUNITY)
Admission: EM | Admit: 2019-07-12 | Discharge: 2019-07-13 | Disposition: A | Discharge status: Home / Self Care | Payer: Self-pay

## 2019-07-12 DIAGNOSIS — R188 Other ascites: Secondary | ICD-10-CM | POA: Insufficient documentation

## 2019-07-12 DIAGNOSIS — K7031 Alcoholic cirrhosis of liver with ascites: Secondary | ICD-10-CM

## 2019-07-12 DIAGNOSIS — K7460 Unspecified cirrhosis of liver: Secondary | ICD-10-CM | POA: Insufficient documentation

## 2019-07-12 HISTORY — PX: IR PARACENTESIS: IMG2679

## 2019-07-12 MED ORDER — LIDOCAINE HCL 1 % IJ SOLN
INTRAMUSCULAR | Status: AC
  Filled 2019-07-12: qty 20

## 2019-07-12 MED ORDER — ALBUMIN HUMAN 25 % IV SOLN
INTRAVENOUS | Status: AC
  Filled 2019-07-12: qty 300

## 2019-07-12 NOTE — Procedures (Signed)
Ultrasound-guided  therapeutic paracentesis performed yielding 13 liters of straw  colored fluid.  EBL is none.

## 2019-07-15 ENCOUNTER — Encounter (HOSPITAL_COMMUNITY): Payer: Self-pay | Admitting: Emergency Medicine

## 2019-07-15 ENCOUNTER — Emergency Department (HOSPITAL_COMMUNITY): Payer: Self-pay

## 2019-07-15 ENCOUNTER — Other Ambulatory Visit: Payer: Self-pay

## 2019-07-15 ENCOUNTER — Emergency Department (HOSPITAL_COMMUNITY)
Admission: EM | Admit: 2019-07-15 | Discharge: 2019-07-15 | Disposition: A | Discharge status: Home / Self Care | Payer: Self-pay | Attending: Emergency Medicine | Admitting: Emergency Medicine

## 2019-07-15 ENCOUNTER — Ambulatory Visit: Payer: Self-pay | Admitting: Family Medicine

## 2019-07-15 DIAGNOSIS — R2241 Localized swelling, mass and lump, right lower limb: Secondary | ICD-10-CM | POA: Insufficient documentation

## 2019-07-15 DIAGNOSIS — B1920 Unspecified viral hepatitis C without hepatic coma: Secondary | ICD-10-CM | POA: Insufficient documentation

## 2019-07-15 DIAGNOSIS — K900 Celiac disease: Secondary | ICD-10-CM | POA: Insufficient documentation

## 2019-07-15 DIAGNOSIS — K766 Portal hypertension: Secondary | ICD-10-CM | POA: Insufficient documentation

## 2019-07-15 DIAGNOSIS — Z79899 Other long term (current) drug therapy: Secondary | ICD-10-CM | POA: Insufficient documentation

## 2019-07-15 DIAGNOSIS — K7031 Alcoholic cirrhosis of liver with ascites: Secondary | ICD-10-CM | POA: Insufficient documentation

## 2019-07-15 DIAGNOSIS — J90 Pleural effusion, not elsewhere classified: Secondary | ICD-10-CM | POA: Insufficient documentation

## 2019-07-15 DIAGNOSIS — Z8616 Personal history of COVID-19: Secondary | ICD-10-CM | POA: Insufficient documentation

## 2019-07-15 DIAGNOSIS — Z9889 Other specified postprocedural states: Secondary | ICD-10-CM

## 2019-07-15 LAB — CBC
HCT: 35.3 % — ABNORMAL LOW (ref 39.0–52.0)
Hemoglobin: 12.3 g/dL — ABNORMAL LOW (ref 13.0–17.0)
MCH: 33.2 pg (ref 26.0–34.0)
MCHC: 34.8 g/dL (ref 30.0–36.0)
MCV: 95.1 fL (ref 80.0–100.0)
Platelets: 175 10*3/uL (ref 150–400)
RBC: 3.71 MIL/uL — ABNORMAL LOW (ref 4.22–5.81)
RDW: 18.3 % — ABNORMAL HIGH (ref 11.5–15.5)
WBC: 5.4 10*3/uL (ref 4.0–10.5)
nRBC: 0 % (ref 0.0–0.2)

## 2019-07-15 LAB — BASIC METABOLIC PANEL
Anion gap: 8 (ref 5–15)
BUN: 14 mg/dL (ref 6–20)
CO2: 22 mmol/L (ref 22–32)
Calcium: 8.3 mg/dL — ABNORMAL LOW (ref 8.9–10.3)
Chloride: 96 mmol/L — ABNORMAL LOW (ref 98–111)
Creatinine, Ser: 0.97 mg/dL (ref 0.61–1.24)
GFR calc Af Amer: >60 mL/min (ref >60)
GFR calc non Af Amer: >60 mL/min (ref >60)
Glucose, Bld: 110 mg/dL — ABNORMAL HIGH (ref 70–99)
Potassium: 4.9 mmol/L (ref 3.5–5.1)
Sodium: 126 mmol/L — ABNORMAL LOW (ref 135–145)

## 2019-07-15 MED ORDER — LIDOCAINE-EPINEPHRINE (PF) 2 %-1:200000 IJ SOLN
20.0000 mL | Freq: Once | INTRAMUSCULAR | Status: AC
  Administered 2019-07-15: 20 mL via INTRADERMAL
  Filled 2019-07-15: qty 20

## 2019-07-15 NOTE — ED Notes (Signed)
Pt transported to xray 

## 2019-07-15 NOTE — ED Notes (Signed)
Thoracentesis supplies at bedside. Written consent for procedure obtained from patient.

## 2019-07-15 NOTE — Discharge Instructions (Addendum)
Return to the emergency department if you have worsening chest pain or shortness of breath.  Please follow-up thoracentesis care instructions.

## 2019-07-15 NOTE — ED Triage Notes (Signed)
Patient c/o shortness of breath, cough and fluid overload in abdomen. Scheduled to have abdomen drained on 27th. Reports no sleep over past 2 nights due to coughing and pain.

## 2019-07-15 NOTE — ED Provider Notes (Addendum)
[facility] DEPARTMENT Provider Note   CSN: [id number] Arrival date & time: 07/15/19  9323     History Chief Complaint  Patient presents with  . Shortness of Breath    Covid +  . Cirrhosis    Shawn Martin is a 44 y.o. male.  HPI    44 year old male history of alcoholic cirrhosis, hepatitis C, hypertension, esophageal varices and portal hypertension abdominal ascites, presents today with increasing dyspnea.  States that he became more short of breath yesterday.  He has had a recent diagnosis of Covid.  He he states that he was asymptomatic with this and that his quarantine was up last Sunday.  Review of records from last hospitalization revealed positive Covid test on April 1 and that he was asymptomatic while in the hospital. Past Medical History:  Diagnosis Date  . Celiac disease   . Hepatic cirrhosis ([place])   . Hypertension     Patient Active Problem List   Diagnosis Date Noted  . Hyperkalemia 06/30/2019  . AKI (acute kidney injury) ([place]) 06/30/2019  . Hyponatremia 06/03/2019  . Alcohol dependence ([place]) 06/03/2019  . Symptomatic anemia 06/03/2019  . Ascites due to alcoholic cirrhosis ([place]) 55/73/2202  . Alcoholic cirrhosis of liver with ascites ([place]) 01/28/2019  . Screening for viral disease 01/28/2019    Past Surgical History:  Procedure Laterality Date  . IR PARACENTESIS  02/07/2019  . IR PARACENTESIS  05/25/2019  . IR PARACENTESIS  06/10/2019  . IR PARACENTESIS  06/15/2019  . IR PARACENTESIS  06/22/2019  . IR PARACENTESIS  07/01/2019  . IR PARACENTESIS  07/12/2019  . IR RADIOLOGIST EVAL & MGMT  07/07/2019  . IR TRANSCATHETER BX  06/22/2019  . IR US GUIDE VASC ACCESS RIGHT  06/22/2019  . IR VENOGRAM HEPATIC WO HEMODYNAMIC EVALUATION  06/22/2019  . NOSE SURGERY         Family History  Problem Relation Age of Onset  . Healthy Mother   . Hypertension Father   . Heart attack Father   . Leukemia Brother   . Colon cancer Neg Hx   .  Stomach cancer Neg Hx   . Pancreatic cancer Neg Hx     Social History   Tobacco Use  . Smoking status: Never Smoker  . Smokeless tobacco: Former Systems developer    Types: Chew  Substance Use Topics  . Alcohol use: Not Currently    Comment: every day drinker, 6 pack per day  . Drug use: Never    Home Medications Prior to Admission medications   Medication Sig Start Date End Date Taking? Authorizing Provider  diphenhydramine-acetaminophen (TYLENOL PM) 25-500 MG TABS tablet Take 1 tablet by mouth at bedtime as needed (sleep).    [provider]  furosemide (LASIX) 40 MG tablet Take 1 tablet (40 mg total) by mouth 2 (two) times daily. 07/04/19   Thurnell Lose, MD  magnesium gluconate (MAGONATE) 500 MG tablet Take 500 mg by mouth daily as needed (cramps).    [provider]  Multiple Vitamin (MULTIVITAMIN WITH MINERALS) TABS tablet Take 1 tablet by mouth daily.    [provider]  spironolactone (ALDACTONE) 100 MG tablet Take 1 tablet (100 mg total) by mouth daily. 07/08/19   Armbruster, [name], MD    Allergies    Gluten meal  Review of Systems   Review of Systems  Constitutional: Positive for activity change and appetite change.  Respiratory: Positive for shortness of breath.   Cardiovascular: Positive  for leg swelling. Negative for chest pain and palpitations.  Gastrointestinal: Positive for abdominal distention.  Genitourinary: Negative.   Musculoskeletal: Negative.   Skin: Negative.   Neurological: Negative.   Hematological: Negative.   Psychiatric/Behavioral: Negative.   All other systems reviewed and are negative. Patient reports that on his last admission he had a right leg pain.  He reports that he had ultrasounds of both of his legs at that time.  Review of records shows bilateral Dopplers from 4 2 that were negative for DVT Physical Exam Updated Vital Signs BP 119/81 (BP Location: Right Arm)   Pulse (!) 112   Temp 98.2 F (36.8 C) (Oral)   Resp  16   Ht 1.829 m (6')   Wt 95 kg   SpO2 97%   BMI 28.40 kg/m   Physical Exam Vitals and nursing note reviewed.  Constitutional:      Appearance: He is well-developed and normal weight.  HENT:     Head: Normocephalic and atraumatic.     Mouth/Throat:     Mouth: Mucous membranes are moist.  Eyes:     Pupils: Pupils are equal, round, and reactive to light.  Cardiovascular:     Rate and Rhythm: Normal rate and regular rhythm.  Pulmonary:     Effort: Tachypnea present.     Breath sounds: Examination of the right-upper field reveals decreased breath sounds. Examination of the right-middle field reveals decreased breath sounds. Examination of the right-lower field reveals decreased breath sounds. Decreased breath sounds present. No wheezing, rhonchi or rales.  Chest:     Chest wall: No mass or deformity.  Abdominal:     General: Bowel sounds are normal.     Palpations: Abdomen is soft.     Comments: Ascites noted the abdomen is soft and nontense  Musculoskeletal:        General: Normal range of motion.     Cervical back: Normal range of motion.     Comments: Right lower leg is swollen relative to the left lower extremity without tenderness or cords noted  Skin:    General: Skin is warm and dry.     Capillary Refill: Capillary refill takes less than 2 seconds.  Neurological:     General: No focal deficit present.     Mental Status: He is alert.  Psychiatric:        Mood and Affect: Mood normal.     ED Results / Procedures / Treatments   Labs (all labs ordered are listed, but only abnormal results are displayed) Labs Reviewed  BASIC METABOLIC PANEL - Abnormal; Notable for the following components:      Result Value   Sodium 126 (*)    Chloride 96 (*)    Glucose, Bld 110 (*)    Calcium 8.3 (*)    All other components within normal limits  CBC - Abnormal; Notable for the following components:   RBC 3.71 (*)    Hemoglobin 12.3 (*)    HCT 35.3 (*)    RDW 18.3 (*)    All  other components within normal limits    EKG None  Radiology DG Chest 2 View  Result Date: 07/15/2019 CLINICAL DATA:  Post right thoracentesis EXAM: CHEST - 2 VIEW COMPARISON:  11/14/2019 FINDINGS: Marked improvement in right pleural effusion. Small residual right effusion remains with right lower lobe atelectasis. No pneumothorax Left lung is clear. No effusion on the left. No heart failure or edema IMPRESSION: Marked improvement in right pleural  effusion.  No pneumothorax. Electronically Signed   By: Franchot Gallo M.D.   On: 07/15/2019 13:14   DG Chest 2 View  Result Date: 07/15/2019 CLINICAL DATA:  Shortness of breath EXAM: CHEST - 2 VIEW COMPARISON:  06/30/2019 FINDINGS: Large right pleural effusion with near-complete opacification of the right hemithorax. Bandlike opacity in the left lung base. No left-sided pleural effusion. Cardiac silhouette is largely obscured. No pneumothorax. IMPRESSION: 1. Large right pleural effusion. 2. Bandlike opacity in the left lung base, atelectasis versus consolidation. Electronically Signed   By: Davina Poke D.O.   On: 07/15/2019 09:24   Initial chest x-Kenneth Lax reviewed by myself agree with radiology interpretation Repeat x-Reylene Stauder after thoracentesis reviewed by myself with decreased effusion, lung reexpansion, and no evidence of pneumothorax.  Awaiting radiology interpretation Procedures THORACENTESIS BEDSIDE  Date/Time: 07/15/2019 12:26 PM Performed by: Pattricia Boss, MD Authorized by: Pattricia Boss, MD   Consent:    Consent obtained:  Written   Consent given by:  Patient   Risks discussed:  Bleeding, infection, pain, pneumothorax and incomplete drainage   Alternatives discussed:  Alternative treatment Anesthesia (see MAR for exact dosages):    Anesthesia method:  Local infiltration   Local anesthetic:  Lidocaine 1% WITH epi Procedure details:    Preparation: Patient was prepped and draped in usual sterile fashion     Patient position:   Sitting   Location:  R midscapular line   Intercostal space:  5th   Puncture method:  Over-the-needle catheter   Ultrasound guidance: yes     Needle gauge:  18   Catheter size:  14 G   Number of attempts:  1   Drainage characteristics:  Serosanguinous Post-procedure details:    Chest x-Shan Valdes performed: yes     Chest x-Solomiya Pascale findings:  Pleural effusion improved   Patient tolerance of procedure:  Tolerated well, no immediate complications Comments:     5 liters fluid removed with stable hemodynamic status and improved respiratory status .Critical Care Performed by: Pattricia Boss, MD Authorized by: Pattricia Boss, MD   Critical care provider statement:    Critical care time (minutes):  45   Critical care end time:  07/15/2019 1:10 PM   Critical care was necessary to treat or prevent imminent or life-threatening deterioration of the following conditions:  Respiratory failure   Critical care was time spent personally by me on the following activities:  Discussions with consultants, evaluation of patient's response to treatment, examination of patient, ordering and performing treatments and interventions, ordering and review of laboratory studies, ordering and review of radiographic studies, pulse oximetry, re-evaluation of patient's condition, obtaining history from patient or surrogate and review of old charts   (including critical care time)  Medications Ordered in ED Medications - No data to display  ED Course  I have reviewed the triage vital signs and the nursing notes.  Pertinent labs & imaging results that were available during my care of the patient were reviewed by me and considered in my medical decision making (see chart for details).    MDM Rules/Calculators/A&P                      1- dyspnea- likely secondary to pulmonary effusion.  Patient with known ascites and has tolerated his recent multiple paracentesis as well. Thoracentesis performed-patient did not experience any  chest pain except at site during procedure.  Postprocedure no evidence of pneumothorax or pulmonary edema noted. Discussed with pulmonary, regarding whether or not fluid  need to be sent for any labs.  Given that patient is not having any fever, and has a history of ascites, do not like patient needs any further laboratory evaluation of his pulmonary effusion fluid.  Discussed with IR and they are aware that he will likely need future thoracentesis, but states they do not do them on the same day as the paracentesis. Repeat chest x-Christeen Lai reviewed by myself and good reexpansion of the lung with no evidence of pneumothorax. 2 right leg swelling plan Doppler right lower extremity 3 hyponatremia patient sodium is 126 review reveals it has been significantly the redness elbow was up to 133 prior to last discharge, felt this is likely where he is staying in his acceptable for him at this time 4 patient with recent positive Covid diagnosis-patient was asymptomatic and is outside of his quarantine at this time Final Clinical Impression(s) / ED Diagnoses Final diagnoses:  Pleural effusion  S/P thoracentesis    Rx / DC Orders ED Discharge Orders    None       Pattricia Boss, MD 07/15/19 [place]    Pattricia Boss, MD 07/15/19 1547

## 2019-07-18 ENCOUNTER — Other Ambulatory Visit: Payer: Self-pay | Admitting: Physician Assistant

## 2019-07-18 MED ORDER — DEXTROSE 5 % IV SOLN: 2.0000 g | Freq: Once | INTRAVENOUS | Status: DC

## 2019-07-18 NOTE — Progress Notes (Signed)
[facility], [place] [address] [place] [address] Phone: [phone] Fax: [phone]  [facility] [place], [place] - [address] [place] [address] Phone: [phone] Fax: [phone]    Your procedure is scheduled on Tuesday, April 27th.  Report to [facility] Main Entrance "A" at 6:00 A.M., and check in at the Admitting office.  Call this number if you have problems the morning of surgery:  [phone]  Call [phone] if you have any questions prior to your surgery date Monday-Friday 8am-4pm   Remember:  Do not eat or drink after midnight the night before your surgery    Take these medicines the morning of surgery with A SIP OF WATER: NONE   As of today, STOP taking any Aspirin (unless otherwise instructed by your surgeon) and Aspirin containing products, Aleve, Naproxen, Ibuprofen, Motrin, Advil, Goody's, BC's, all herbal medications, fish oil, and all vitamins.             Do not wear jewelry.            Do not wear lotions, powders, colognes, or deodorant.            Men may shave face and neck.            Do not bring valuables to the hospital.            [facility] is not responsible for any belongings or valuables.  Do NOT Smoke (Tobacco/Vapping) or drink Alcohol 24 hours prior to your procedure If you use a CPAP at night, you may bring all equipment for your overnight stay.   Contacts, glasses, dentures or bridgework may not be worn into surgery.      For patients admitted to the hospital, discharge time will be determined by your treatment team.   Patients discharged the day of surgery will not be allowed to drive home, and someone needs to stay with them for 24 hours.  Special instructions:   [place]- Preparing For Surgery  Before surgery, you can play an important role. Because skin is not sterile, your skin needs to be as free of germs as  possible. You can reduce the number of germs on your skin by washing with CHG (chlorahexidine gluconate) Soap before surgery.  CHG is an antiseptic cleaner which kills germs and bonds with the skin to continue killing germs even after washing.    Oral Hygiene is also important to reduce your risk of infection.  Remember - BRUSH YOUR TEETH THE MORNING OF SURGERY WITH YOUR REGULAR TOOTHPASTE  Please do not use if you have an allergy to CHG or antibacterial soaps. If your skin becomes reddened/irritated stop using the CHG.  Do not shave (including legs and underarms) for at least 48 hours prior to first CHG shower. It is OK to shave your face.  Please follow these instructions carefully.   1. Shower the NIGHT BEFORE SURGERY and the MORNING OF SURGERY with CHG Soap.   2. If you chose to wash your hair, wash your hair first as usual with your normal shampoo.  3. After you shampoo, rinse your hair and body thoroughly to remove the shampoo.  4. Use CHG as you would any other liquid soap. You can apply CHG directly to the skin and wash gently with a scrungie or a clean washcloth.   5. Apply the CHG Soap to your body ONLY  FROM THE NECK DOWN.  Do not use on open wounds or open sores. Avoid contact with your eyes, ears, mouth and genitals (private parts). Wash Face and genitals (private parts)  with your normal soap.   6. Wash thoroughly, paying special attention to the area where your surgery will be performed.  7. Thoroughly rinse your body with warm water from the neck down.  8. DO NOT shower/wash with your normal soap after using and rinsing off the CHG Soap.  9. Pat yourself dry with a CLEAN TOWEL.  10. Wear CLEAN PAJAMAS to bed the night before surgery, wear comfortable clothes the morning of surgery  11. Place CLEAN SHEETS on your bed the night of your first shower and DO NOT SLEEP WITH PETS.  Day of Surgery:  Do not apply any deodorants/lotions.  Please wear clean clothes to the  hospital/surgery center.   Remember to brush your teeth WITH YOUR REGULAR TOOTHPASTE.   Please read over the following fact sheets that you were given.

## 2019-07-19 ENCOUNTER — Encounter (HOSPITAL_COMMUNITY): Payer: Self-pay

## 2019-07-19 ENCOUNTER — Ambulatory Visit (HOSPITAL_COMMUNITY)
Admission: RE | Admit: 2019-07-19 | Discharge: 2019-07-19 | Disposition: A | Discharge status: Home / Self Care | Payer: Self-pay | Source: Ambulatory Visit | Attending: Gastroenterology | Admitting: Gastroenterology

## 2019-07-19 ENCOUNTER — Encounter (HOSPITAL_COMMUNITY)
Admission: RE | Admit: 2019-07-19 | Discharge: 2019-07-19 | Disposition: A | Discharge status: Home / Self Care | Payer: Self-pay | Source: Ambulatory Visit | Attending: Interventional Radiology | Admitting: Interventional Radiology

## 2019-07-19 ENCOUNTER — Other Ambulatory Visit: Payer: Self-pay

## 2019-07-19 DIAGNOSIS — Z01812 Encounter for preprocedural laboratory examination: Secondary | ICD-10-CM | POA: Insufficient documentation

## 2019-07-19 DIAGNOSIS — K7031 Alcoholic cirrhosis of liver with ascites: Secondary | ICD-10-CM | POA: Insufficient documentation

## 2019-07-19 HISTORY — PX: IR PARACENTESIS: IMG2679

## 2019-07-19 LAB — CBC
HCT: 37.4 % — ABNORMAL LOW (ref 39.0–52.0)
Hemoglobin: 12.9 g/dL — ABNORMAL LOW (ref 13.0–17.0)
MCH: 32.5 pg (ref 26.0–34.0)
MCHC: 34.5 g/dL (ref 30.0–36.0)
MCV: 94.2 fL (ref 80.0–100.0)
Platelets: 178 10*3/uL (ref 150–400)
RBC: 3.97 MIL/uL — ABNORMAL LOW (ref 4.22–5.81)
RDW: 18.6 % — ABNORMAL HIGH (ref 11.5–15.5)
WBC: 3.9 10*3/uL — ABNORMAL LOW (ref 4.0–10.5)
nRBC: 0 % (ref 0.0–0.2)

## 2019-07-19 LAB — COMPREHENSIVE METABOLIC PANEL
ALT: 42 U/L (ref 0–44)
AST: 75 U/L — ABNORMAL HIGH (ref 15–41)
Albumin: 2.6 g/dL — ABNORMAL LOW (ref 3.5–5.0)
Alkaline Phosphatase: 117 U/L (ref 38–126)
Anion gap: 7 (ref 5–15)
BUN: 10 mg/dL (ref 6–20)
CO2: 25 mmol/L (ref 22–32)
Calcium: 8.5 mg/dL — ABNORMAL LOW (ref 8.9–10.3)
Chloride: 99 mmol/L (ref 98–111)
Creatinine, Ser: 1.14 mg/dL (ref 0.61–1.24)
GFR calc Af Amer: >60 mL/min (ref >60)
GFR calc non Af Amer: >60 mL/min (ref >60)
Glucose, Bld: 113 mg/dL — ABNORMAL HIGH (ref 70–99)
Potassium: 4.6 mmol/L (ref 3.5–5.1)
Sodium: 131 mmol/L — ABNORMAL LOW (ref 135–145)
Total Bilirubin: 1 mg/dL (ref 0.3–1.2)
Total Protein: 6.2 g/dL — ABNORMAL LOW (ref 6.5–8.1)

## 2019-07-19 LAB — AMMONIA: Ammonia: 17 umol/L (ref 9–35)

## 2019-07-19 LAB — PROTIME-INR
INR: 1.1 (ref 0.8–1.2)
Prothrombin Time: 13.8 seconds (ref 11.4–15.2)

## 2019-07-19 MED ORDER — LIDOCAINE HCL 1 % IJ SOLN
INTRAMUSCULAR | Status: DC | PRN
  Administered 2019-07-19: 5 mL

## 2019-07-19 MED ORDER — ALBUMIN HUMAN 25 % IV SOLN
INTRAVENOUS | Status: AC
  Filled 2019-07-19: qty 200

## 2019-07-19 MED ORDER — ALBUMIN HUMAN 25 % IV SOLN
50.0000 g | Freq: Once | INTRAVENOUS | Status: AC
  Administered 2019-07-19: 50 g via INTRAVENOUS
  Filled 2019-07-19: qty 200

## 2019-07-19 MED ORDER — LIDOCAINE HCL 1 % IJ SOLN
INTRAMUSCULAR | Status: AC
  Filled 2019-07-19: qty 20

## 2019-07-19 NOTE — Progress Notes (Signed)
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  History includes never smoker, HTN, hepatic cirrhosis, celiac disease. Asymptomatic COIVD-19 06/30/19. He required right thoracentesis 07/15/19 and has required multiple paracenteses (currently getting ~ weekly sine 05/2019), last scheduled for 07/19/19.   First evaluated for abdominal distention in May 2020 and was referred to GI after US showed cirrhosis with ascites. He did not follow-up with GI until 12/2018 and underwent first paracentesis 02/07/19 and EGD on 02/08/19. Labs and EGD findings consistent with celiac disease and gluten free diet recommended. Non-reactive results for hepatitis A, B, C. Labs suggestive of autoimmune hepatitis, so this celiac disease, and alcohol history thought to be contributing to cirrhosis. Liver biopsy recommended, but due to financial constraints was not done until 06/22/19 with pathology findings consistent with cirrhosis of uncertain etiology. Admitted 3/521-06/06/19 with hyponatremia and decompensated cirrhosis with ascites and 06/30/19-07/04/19 for massive recurrent ascites s/p paracentesis x2. Echo done as part of TIPS work-up. Also diagnosed with asymptomatic COVID-19. 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 He was scheduled for EGD/variceal banding, but following hepatology evaluation on 07/05/19, thought to be a good candidate for TIPS instead. MELD score 10 then and too early to be considered for transplant evaluation.    I evaluated Mr. Shawn Martin at his PAT visit given his history and recent ED visit. He denied chest pain, shortness of breath, syncope. No known CAD history.  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 He says this was a very physical job requiring lifting, moving, etc and denied chest pain or SOB with this activity. He has a history of RLE injury and get mild RLE edema (negative for DVT 07/01/19) which he feels is chronic, stable. He has had an intermittent dry cough following right thoracentesis, worse when he lies on left side, but otherwise is not having any of the acute respiratory symptoms that he was having on 07/15/19 which prompted him to go to the ED. Says his abdominal distention has not been as bad since his last paracentesis. His hyponatremia has also been more stable following adjustments to his diuretic therapy.   Candiss Norse, PA-C with IR had ordered repeat EKG and CXR; however, given these were done at 07/15/19 ED visit, request to repeat on the day of surgery instead of 07/19/19 PAT visit.   Reviewed available information with anesthesiologist Renold Don, MD. Anesthesia team to evaluate on the day of surgery.     VS: BP 121/78   Pulse 86   Temp 36.8 C (Oral)   Resp 18   Ht 6' (1.829 m)   Wt 90.8 kg   SpO2 100%   BMI 27.15 kg/m  Heart RRR, no murmur noted. Lungs diminished right base, but overall clear. Trace right pre-tibial edema. He denied limitations in neck or mouth movement, denied known anesthesia complications.   PROVIDERS: Patient, No Pcp Per. He is trying to get established with a PCP--will likely be seeing Kathe Becton, NP, but not appointment  - [place] Cellar, MD is GI, but liver disease is followed  by Roosevelt Locks, NP/Zamor, Loni Muse, MD at [place].    LABS: Labs reviewed: Acceptable for surgery. (all labs ordered are listed, but only abnormal results are displayed)  Labs Reviewed  CBC - Abnormal; Notable for the following components:      Result Value   WBC 3.9 (*)    RBC 3.97 (*)    Hemoglobin 12.9 (*)    HCT 37.4 (*)    RDW 18.6 (*)    All  other components within normal limits  COMPREHENSIVE METABOLIC PANEL - Abnormal; Notable for the following components:   Sodium 131 (*)    Glucose, Bld 113 (*)    Calcium 8.5 (*)    Total Protein 6.2 (*)    Albumin 2.6 (*)    AST 75 (*)    All other components within normal limits  AMMONIA  PROTIME-INR  TYPE AND SCREEN    EGD 02/08/19: - Esophagogastric landmarks identified. - Grade II esophageal varices. - Portal hypertensive gastropathy. - Flattened / scalloped mucosa was found in the duodenum. Biopsied. - Biopsies were taken with a cold forceps for Helicobacter pylori testing. (Negative for H. Pylori and biopsies negative for dysplasia or malignancy.)   IMAGES: Per IR--repeat CXR on the day of procedure.  1V CXR (post right thoracentesis) 07/15/19: FINDINGS: - Marked improvement in right pleural effusion. Small residual right effusion remains with right lower lobe atelectasis. No pneumothorax - Left lung is clear. No effusion on the left. No heart failure or edema IMPRESSION: Marked improvement in right pleural effusion.  No pneumothorax.   EKG:  EKG 07/15/19: Sinus tachycardia at 113 bpm Rightward axis Possible Inferior infarct , age undetermined Abnormal ECG Confirmed by Davonna Belling [phone]) on 07/16/2019 7:47:36 PM   CV: Echo 07/04/19: IMPRESSIONS  1. The LV is hyperdynamic with LVEF 70-75%. There is an intracavitary  gradient of 12 mmHG. There is chordal SAM. There is no asymmetric LVH to  suggest hypertrophic cardiomyopathy. These findings are related to the  hyperdynamic LV. There is  pseudodyskinesis of the inferior wall, likely related to increased  intraabdominal pressure in this patient with liver disease (normal  finding). Left ventricular ejection fraction, by estimation, is 70 to 75%.  The left ventricle has hyperdynamic function.  The left ventricle has no regional wall motion abnormalities. Left  ventricular diastolic parameters were  normal.  2. Right ventricular systolic function is normal. The right ventricular  size is mildly enlarged. There is normal pulmonary artery systolic  pressure. The estimated right ventricular systolic pressure is 74.0 mmHg.  3. The mitral valve is grossly normal. Mild mitral valve regurgitation.  No evidence of mitral stenosis.  4. The aortic valve is tricuspid. Aortic valve regurgitation is not  visualized. No aortic stenosis is present.  5. The inferior vena cava is normal in size with <50% respiratory  variability, suggesting right atrial pressure of 8 mmHg.    BLE venous US 07/01/19: Summary:  BILATERAL:  - No evidence of deep vein thrombosis seen in the lower extremities,  bilaterally.  RIGHT:  - No cystic structure found in the popliteal fossa.  LEFT:  - No cystic structure found in the popliteal fossa.    Past Medical History:  Diagnosis Date  . Celiac disease   . Hepatic cirrhosis ([place])   . Hypertension     Past Surgical History:  Procedure Laterality Date  . IR PARACENTESIS  02/07/2019  . IR PARACENTESIS  05/25/2019  . IR PARACENTESIS  06/10/2019  . IR  PARACENTESIS  06/15/2019  . IR PARACENTESIS  06/22/2019  . IR PARACENTESIS  07/01/2019  . IR PARACENTESIS  07/12/2019  . IR RADIOLOGIST EVAL & MGMT  07/07/2019  . IR TRANSCATHETER BX  06/22/2019  . IR US GUIDE VASC ACCESS RIGHT  06/22/2019  . IR VENOGRAM HEPATIC WO HEMODYNAMIC EVALUATION  06/22/2019  . NOSE SURGERY      MEDICATIONS: . diphenhydramine-acetaminophen (TYLENOL PM) 25-500 MG TABS tablet  . furosemide (LASIX) 40 MG tablet  . magnesium gluconate (MAGONATE) 500 MG tablet  . Multiple Vitamin (MULTIVITAMIN WITH MINERALS) TABS tablet  . spironolactone (ALDACTONE) 100 MG tablet   No current facility-administered medications for this encounter.   Marland Kitchen 0.9 %  sodium chloride infusion    Myra Gianotti, PA-C Surgical Short Stay/Anesthesiology [facility] Phone [phone] [facility] Phone [phone] 07/19/2019 6:04  PM

## 2019-07-19 NOTE — Progress Notes (Signed)
PCP - denies Cardiologist - denies   PPM/ICD - denies  Chest x-ray - 07/15/2019   EKG - 07/15/2019 Spoke with Larene Beach PA-C, EKG and Chest XRAY to be done day of surgery.  Stress Test - denies ECHO - denies Cardiac Cath - denies  Sleep Study - denies CPAP - N/A  Blood Thinner Instructions: N/A Aspirin Instructions: N/A  ERAS Protcol - No   COVID TEST-  N/A. Positive results on 06/30/2019. Per protocol patient not to be tested within 90 days.  Anesthesia review: YES, per MD order  Patient denies shortness of breath, fever, cough and chest pain at PAT appointment  All instructions explained to the patient, with a verbal understanding of the material. Patient agrees to go over the instructions while at home for a better understanding. Patient also instructed to self quarantine after being tested for COVID-19. The opportunity to ask questions was provided.

## 2019-07-19 NOTE — Anesthesia Preprocedure Evaluation (Addendum)
Anesthesia Evaluation  Patient identified by MRN, date of birth, ID band Patient awake    Reviewed: Allergy & Precautions, NPO status , Patient's Chart, lab work & pertinent test results  Airway Mallampati: II  TM Distance: >3 FB Neck ROM: Full    Dental no notable dental hx. (+) Teeth Intact, Dental Advisory Given, Caps   Pulmonary neg pulmonary ROS,  Covid + 06/30/19- asymptomatic   Pulmonary exam normal breath sounds clear to auscultation       Cardiovascular hypertension, Pt. on medications Normal cardiovascular exam Rhythm:Regular Rate:Normal     Neuro/Psych PSYCHIATRIC DISORDERS Anxiety negative neurological ROS     GI/Hepatic negative GI ROS, (+) Cirrhosis   ascites  substance abuse  alcohol use, Quit drinking ETOH 2-3 mos. ago   Endo/Other  negative endocrine ROS  Renal/GU Renal InsufficiencyRenal disease  negative genitourinary   Musculoskeletal negative musculoskeletal ROS (+)   Abdominal   Peds  Hematology  (+) anemia ,   Anesthesia Other Findings   Reproductive/Obstetrics                         Anesthesia Physical Anesthesia Plan  ASA: III  Anesthesia Plan: General   Post-op Pain Management:    Induction: Intravenous  PONV Risk Score and Plan: 3 and Midazolam, Ondansetron, Dexamethasone and Treatment may vary due to age or medical condition  Airway Management Planned: Oral ETT  Additional Equipment: Arterial line  Intra-op Plan:   Post-operative Plan:   Informed Consent: I have reviewed the patients History and Physical, chart, labs and discussed the procedure including the risks, benefits and alternatives for the proposed anesthesia with the patient or authorized representative who has indicated his/her understanding and acceptance.     Dental advisory given  Plan Discussed with: CRNA and Surgeon  Anesthesia Plan Comments: (See PAT note written 07/19/2019 by  Myra Gianotti, PA-C. Asymptomatic COVID-19 06/30/19, so will not have a pre-procedure COVID-19 test. )      Anesthesia Quick Evaluation

## 2019-07-21 ENCOUNTER — Ambulatory Visit: Payer: Self-pay

## 2019-07-22 ENCOUNTER — Ambulatory Visit (HOSPITAL_COMMUNITY): Payer: Self-pay

## 2019-07-23 ENCOUNTER — Other Ambulatory Visit (HOSPITAL_COMMUNITY): Payer: Self-pay

## 2019-07-25 ENCOUNTER — Ambulatory Visit (HOSPITAL_COMMUNITY)
Admission: RE | Admit: 2019-07-25 | Discharge: 2019-07-25 | Disposition: A | Discharge status: Home / Self Care | Payer: Self-pay | Source: Ambulatory Visit | Attending: Interventional Radiology | Admitting: Interventional Radiology

## 2019-07-25 ENCOUNTER — Other Ambulatory Visit: Payer: Self-pay

## 2019-07-25 ENCOUNTER — Other Ambulatory Visit: Payer: Self-pay | Admitting: Radiology

## 2019-07-25 ENCOUNTER — Encounter (HOSPITAL_COMMUNITY): Payer: Self-pay | Admitting: Interventional Radiology

## 2019-07-25 DIAGNOSIS — K7031 Alcoholic cirrhosis of liver with ascites: Secondary | ICD-10-CM | POA: Insufficient documentation

## 2019-07-25 DIAGNOSIS — J90 Pleural effusion, not elsewhere classified: Secondary | ICD-10-CM | POA: Insufficient documentation

## 2019-07-25 MED ORDER — IOHEXOL 350 MG/ML SOLN
100.0000 mL | Freq: Once | INTRAVENOUS | Status: AC | PRN
  Administered 2019-07-25: 18:00:00 100 mL via INTRAVENOUS

## 2019-07-26 ENCOUNTER — Inpatient Hospital Stay (HOSPITAL_COMMUNITY)
Admission: RE | Admit: 2019-07-26 | Discharge: 2019-07-29 | DRG: 405 | Disposition: A | Discharge status: Home / Self Care | Payer: Self-pay | Source: Ambulatory Visit | Attending: Internal Medicine | Admitting: Internal Medicine

## 2019-07-26 ENCOUNTER — Other Ambulatory Visit: Payer: Self-pay

## 2019-07-26 ENCOUNTER — Inpatient Hospital Stay (HOSPITAL_COMMUNITY): Payer: Self-pay | Admitting: Anesthesiology

## 2019-07-26 ENCOUNTER — Inpatient Hospital Stay (HOSPITAL_COMMUNITY): Payer: Self-pay

## 2019-07-26 ENCOUNTER — Ambulatory Visit (HOSPITAL_COMMUNITY)
Admission: RE | Admit: 2019-07-26 | Discharge: 2019-07-26 | Disposition: A | Discharge status: Home / Self Care | Payer: Self-pay | Source: Ambulatory Visit | Attending: Interventional Radiology | Admitting: Interventional Radiology

## 2019-07-26 ENCOUNTER — Encounter (HOSPITAL_COMMUNITY)
Admission: RE | Disposition: A | Discharge status: Home / Self Care | Payer: Self-pay | Source: Ambulatory Visit | Attending: Internal Medicine

## 2019-07-26 ENCOUNTER — Encounter (HOSPITAL_COMMUNITY): Payer: Self-pay | Admitting: Interventional Radiology

## 2019-07-26 ENCOUNTER — Inpatient Hospital Stay (HOSPITAL_COMMUNITY): Payer: Self-pay | Admitting: Vascular Surgery

## 2019-07-26 DIAGNOSIS — Z8616 Personal history of COVID-19: Secondary | ICD-10-CM

## 2019-07-26 DIAGNOSIS — I8510 Secondary esophageal varices without bleeding: Secondary | ICD-10-CM | POA: Diagnosis present

## 2019-07-26 DIAGNOSIS — K3189 Other diseases of stomach and duodenum: Secondary | ICD-10-CM | POA: Diagnosis present

## 2019-07-26 DIAGNOSIS — Z806 Family history of leukemia: Secondary | ICD-10-CM

## 2019-07-26 DIAGNOSIS — K7031 Alcoholic cirrhosis of liver with ascites: Secondary | ICD-10-CM

## 2019-07-26 DIAGNOSIS — Z8249 Family history of ischemic heart disease and other diseases of the circulatory system: Secondary | ICD-10-CM

## 2019-07-26 DIAGNOSIS — J9601 Acute respiratory failure with hypoxia: Secondary | ICD-10-CM | POA: Diagnosis present

## 2019-07-26 DIAGNOSIS — K7460 Unspecified cirrhosis of liver: Secondary | ICD-10-CM | POA: Diagnosis present

## 2019-07-26 DIAGNOSIS — K900 Celiac disease: Secondary | ICD-10-CM | POA: Diagnosis present

## 2019-07-26 DIAGNOSIS — K766 Portal hypertension: Secondary | ICD-10-CM | POA: Diagnosis present

## 2019-07-26 DIAGNOSIS — J9600 Acute respiratory failure, unspecified whether with hypoxia or hypercapnia: Secondary | ICD-10-CM

## 2019-07-26 DIAGNOSIS — E871 Hypo-osmolality and hyponatremia: Secondary | ICD-10-CM | POA: Diagnosis present

## 2019-07-26 DIAGNOSIS — R0602 Shortness of breath: Secondary | ICD-10-CM

## 2019-07-26 DIAGNOSIS — Z91018 Allergy to other foods: Secondary | ICD-10-CM

## 2019-07-26 DIAGNOSIS — J811 Chronic pulmonary edema: Secondary | ICD-10-CM

## 2019-07-26 HISTORY — PX: IR TIPS: IMG2295

## 2019-07-26 HISTORY — PX: RADIOLOGY WITH ANESTHESIA: SHX6223

## 2019-07-26 HISTORY — PX: IR PARACENTESIS: IMG2679

## 2019-07-26 HISTORY — PX: IR THORACENTESIS ASP PLEURAL SPACE W/IMG GUIDE: IMG5380

## 2019-07-26 LAB — TYPE AND SCREEN
ABO/RH(D): A POS
Antibody Screen: POSITIVE

## 2019-07-26 LAB — CBC WITH DIFFERENTIAL/PLATELET
Abs Immature Granulocytes: 0.04 10*3/uL (ref 0.00–0.07)
Basophils Absolute: 0 10*3/uL (ref 0.0–0.1)
Basophils Relative: 0 %
Eosinophils Absolute: 0 10*3/uL (ref 0.0–0.5)
Eosinophils Relative: 0 %
HCT: 38.8 % — ABNORMAL LOW (ref 39.0–52.0)
Hemoglobin: 13.2 g/dL (ref 13.0–17.0)
Immature Granulocytes: 0 %
Lymphocytes Relative: 4 %
Lymphs Abs: 0.4 10*3/uL — ABNORMAL LOW (ref 0.7–4.0)
MCH: 32.2 pg (ref 26.0–34.0)
MCHC: 34 g/dL (ref 30.0–36.0)
MCV: 94.6 fL (ref 80.0–100.0)
Monocytes Absolute: 0.4 10*3/uL (ref 0.1–1.0)
Monocytes Relative: 4 %
Neutro Abs: 9.6 10*3/uL — ABNORMAL HIGH (ref 1.7–7.7)
Neutrophils Relative %: 92 %
Platelets: 153 10*3/uL (ref 150–400)
RBC: 4.1 MIL/uL — ABNORMAL LOW (ref 4.22–5.81)
RDW: 18.6 % — ABNORMAL HIGH (ref 11.5–15.5)
WBC: 10.5 10*3/uL (ref 4.0–10.5)
nRBC: 0 % (ref 0.0–0.2)

## 2019-07-26 LAB — BASIC METABOLIC PANEL
Anion gap: 10 (ref 5–15)
BUN: 14 mg/dL (ref 6–20)
CO2: 22 mmol/L (ref 22–32)
Calcium: 8.5 mg/dL — ABNORMAL LOW (ref 8.9–10.3)
Chloride: 100 mmol/L (ref 98–111)
Creatinine, Ser: 1.06 mg/dL (ref 0.61–1.24)
GFR calc Af Amer: >60 mL/min (ref >60)
GFR calc non Af Amer: >60 mL/min (ref >60)
Glucose, Bld: 99 mg/dL (ref 70–99)
Potassium: 4 mmol/L (ref 3.5–5.1)
Sodium: 132 mmol/L — ABNORMAL LOW (ref 135–145)

## 2019-07-26 LAB — POCT I-STAT 7, (LYTES, BLD GAS, ICA,H+H)
Acid-base deficit: 5 mmol/L — ABNORMAL HIGH (ref 0.0–2.0)
Bicarbonate: 21.5 mmol/L (ref 20.0–28.0)
Calcium, Ion: 1.13 mmol/L — ABNORMAL LOW (ref 1.15–1.40)
HCT: 38 % — ABNORMAL LOW (ref 39.0–52.0)
Hemoglobin: 12.9 g/dL — ABNORMAL LOW (ref 13.0–17.0)
O2 Saturation: 94 %
Potassium: 3.8 mmol/L (ref 3.5–5.1)
Sodium: 134 mmol/L — ABNORMAL LOW (ref 135–145)
TCO2: 23 mmol/L (ref 22–32)
pCO2 arterial: 42.8 mmHg (ref 32.0–48.0)
pH, Arterial: 7.309 — ABNORMAL LOW (ref 7.350–7.450)
pO2, Arterial: 78 mmHg — ABNORMAL LOW (ref 83.0–108.0)

## 2019-07-26 LAB — GLUCOSE, CAPILLARY: Glucose-Capillary: 131 mg/dL — ABNORMAL HIGH (ref 70–99)

## 2019-07-26 LAB — MRSA PCR SCREENING: MRSA by PCR: NEGATIVE

## 2019-07-26 LAB — PROTIME-INR
INR: 1.1 (ref 0.8–1.2)
Prothrombin Time: 13.9 seconds (ref 11.4–15.2)

## 2019-07-26 SURGERY — IR WITH ANESTHESIA
Anesthesia: General

## 2019-07-26 MED ORDER — ONDANSETRON HCL 4 MG/2ML IJ SOLN
INTRAMUSCULAR | Status: DC | PRN
  Administered 2019-07-26 (×2): 4 mg via INTRAVENOUS

## 2019-07-26 MED ORDER — MIDAZOLAM HCL 5 MG/5ML IJ SOLN
INTRAMUSCULAR | Status: DC | PRN
  Administered 2019-07-26 (×2): 1 mg via INTRAVENOUS

## 2019-07-26 MED ORDER — DOCUSATE SODIUM 100 MG PO CAPS: 100.0000 mg | ORAL_CAPSULE | Freq: Two times a day (BID) | ORAL | Status: DC | PRN

## 2019-07-26 MED ORDER — ROCURONIUM BROMIDE 50 MG/5ML IV SOSY
PREFILLED_SYRINGE | INTRAVENOUS | Status: DC | PRN
  Administered 2019-07-26: 20 mg via INTRAVENOUS
  Administered 2019-07-26: 50 mg via INTRAVENOUS
  Administered 2019-07-26 (×2): 20 mg via INTRAVENOUS

## 2019-07-26 MED ORDER — PHENYLEPHRINE 40 MCG/ML (10ML) SYRINGE FOR IV PUSH (FOR BLOOD PRESSURE SUPPORT)
PREFILLED_SYRINGE | INTRAVENOUS | Status: DC | PRN
  Administered 2019-07-26 (×3): 80 ug via INTRAVENOUS
  Administered 2019-07-26: 40 ug via INTRAVENOUS
  Administered 2019-07-26 (×2): 80 ug via INTRAVENOUS

## 2019-07-26 MED ORDER — OXYCODONE HCL 5 MG PO TABS: 5.0000 mg | ORAL_TABLET | Freq: Once | ORAL | Status: DC | PRN

## 2019-07-26 MED ORDER — CEFAZOLIN SODIUM-DEXTROSE 2-4 GM/100ML-% IV SOLN
2.0000 g | INTRAVENOUS | Status: AC
  Administered 2019-07-26: 2 g via INTRAVENOUS
  Filled 2019-07-26: qty 100

## 2019-07-26 MED ORDER — CHLORHEXIDINE GLUCONATE CLOTH 2 % EX PADS
6.0000 | MEDICATED_PAD | Freq: Every day | CUTANEOUS | Status: DC
  Administered 2019-07-26 – 2019-07-27 (×2): 6 via TOPICAL

## 2019-07-26 MED ORDER — LIDOCAINE 2% (20 MG/ML) 5 ML SYRINGE
INTRAMUSCULAR | Status: DC | PRN
  Administered 2019-07-26: 70 mg via INTRAVENOUS

## 2019-07-26 MED ORDER — SODIUM CHLORIDE 0.9 % IV SOLN: INTRAVENOUS | Status: DC

## 2019-07-26 MED ORDER — PROMETHAZINE HCL 25 MG/ML IJ SOLN
12.5000 mg | Freq: Once | INTRAMUSCULAR | Status: AC
  Administered 2019-07-26: 18:00:00 12.5 mg via INTRAVENOUS
  Filled 2019-07-26: qty 1

## 2019-07-26 MED ORDER — LACTATED RINGERS IV SOLN: INTRAVENOUS | Status: DC | PRN

## 2019-07-26 MED ORDER — FUROSEMIDE 10 MG/ML IJ SOLN
INTRAMUSCULAR | Status: AC
  Filled 2019-07-26: qty 4

## 2019-07-26 MED ORDER — ONDANSETRON HCL 4 MG/2ML IJ SOLN
4.0000 mg | Freq: Four times a day (QID) | INTRAMUSCULAR | Status: DC | PRN
  Administered 2019-07-26: 17:00:00 4 mg via INTRAVENOUS
  Filled 2019-07-26: qty 2

## 2019-07-26 MED ORDER — EPHEDRINE SULFATE-NACL 50-0.9 MG/10ML-% IV SOSY
PREFILLED_SYRINGE | INTRAVENOUS | Status: DC | PRN
  Administered 2019-07-26 (×2): 10 mg via INTRAVENOUS

## 2019-07-26 MED ORDER — ALBUMIN HUMAN 25 % IV SOLN
25.0000 g | INTRAVENOUS | Status: AC
  Administered 2019-07-26: 16:00:00 25 g via INTRAVENOUS
  Filled 2019-07-26: qty 100

## 2019-07-26 MED ORDER — GELATIN ABSORBABLE 12-7 MM EX MISC
CUTANEOUS | Status: AC
  Filled 2019-07-26: qty 1

## 2019-07-26 MED ORDER — PHENYLEPHRINE HCL-NACL 10-0.9 MG/250ML-% IV SOLN
INTRAVENOUS | Status: DC | PRN
  Administered 2019-07-26: 60 ug/min via INTRAVENOUS

## 2019-07-26 MED ORDER — PROPOFOL 10 MG/ML IV BOLUS
INTRAVENOUS | Status: DC | PRN
  Administered 2019-07-26: 160 mg via INTRAVENOUS

## 2019-07-26 MED ORDER — IOHEXOL 300 MG/ML  SOLN
100.0000 mL | Freq: Once | INTRAMUSCULAR | Status: AC | PRN
  Administered 2019-07-26: 13:00:00 100 mL via INTRAVENOUS

## 2019-07-26 MED ORDER — POLYETHYLENE GLYCOL 3350 17 G PO PACK: 17.0000 g | PACK | Freq: Every day | ORAL | Status: DC | PRN

## 2019-07-26 MED ORDER — FENTANYL CITRATE (PF) 100 MCG/2ML IJ SOLN: 25.0000 ug | INTRAMUSCULAR | Status: DC | PRN

## 2019-07-26 MED ORDER — IOHEXOL 300 MG/ML  SOLN
100.0000 mL | Freq: Once | INTRAMUSCULAR | Status: AC | PRN
  Administered 2019-07-26: 60 mL via INTRA_ARTERIAL

## 2019-07-26 MED ORDER — DEXAMETHASONE SODIUM PHOSPHATE 10 MG/ML IJ SOLN
INTRAMUSCULAR | Status: DC | PRN
  Administered 2019-07-26: 8 mg via INTRAVENOUS

## 2019-07-26 MED ORDER — LACTATED RINGERS IV SOLN: INTRAVENOUS | Status: DC

## 2019-07-26 MED ORDER — ALBUMIN HUMAN 25 % IV SOLN
25.0000 g | Freq: Four times a day (QID) | INTRAVENOUS | Status: AC
  Administered 2019-07-26 – 2019-07-27 (×4): 25 g via INTRAVENOUS
  Filled 2019-07-26 (×4): qty 100

## 2019-07-26 MED ORDER — PANTOPRAZOLE SODIUM 40 MG IV SOLR
40.0000 mg | Freq: Every day | INTRAVENOUS | Status: DC
  Administered 2019-07-26 – 2019-07-28 (×3): 40 mg via INTRAVENOUS
  Filled 2019-07-26 (×3): qty 40

## 2019-07-26 MED ORDER — ALBUMIN HUMAN 5 % IV SOLN
INTRAVENOUS | Status: DC | PRN
Start: 2019-07-26 — End: 2019-07-26

## 2019-07-26 MED ORDER — METOCLOPRAMIDE HCL 5 MG/ML IJ SOLN
10.0000 mg | Freq: Four times a day (QID) | INTRAMUSCULAR | Status: AC
  Administered 2019-07-26 – 2019-07-27 (×4): 10 mg via INTRAVENOUS
  Filled 2019-07-26 (×5): qty 2

## 2019-07-26 MED ORDER — FUROSEMIDE 10 MG/ML IJ SOLN
40.0000 mg | Freq: Once | INTRAMUSCULAR | Status: AC
  Administered 2019-07-26: 40 mg via INTRAVENOUS

## 2019-07-26 MED ORDER — FENTANYL CITRATE (PF) 100 MCG/2ML IJ SOLN
INTRAMUSCULAR | Status: DC | PRN
  Administered 2019-07-26 (×3): 50 ug via INTRAVENOUS

## 2019-07-26 MED ORDER — FUROSEMIDE 10 MG/ML IJ SOLN
40.0000 mg | Freq: Once | INTRAMUSCULAR | Status: AC
  Administered 2019-07-26: 17:00:00 40 mg via INTRAVENOUS
  Filled 2019-07-26: qty 4

## 2019-07-26 MED ORDER — IOHEXOL 300 MG/ML  SOLN
100.0000 mL | Freq: Once | INTRAMUSCULAR | Status: AC | PRN
  Administered 2019-07-26: 100 mL via INTRAVENOUS

## 2019-07-26 MED ORDER — ONDANSETRON HCL 4 MG/2ML IJ SOLN: 4.0000 mg | Freq: Once | INTRAMUSCULAR | Status: DC | PRN

## 2019-07-26 MED ORDER — OXYCODONE HCL 5 MG/5ML PO SOLN: 5.0000 mg | Freq: Once | ORAL | Status: DC | PRN

## 2019-07-26 NOTE — Progress Notes (Signed)
Interventional Radiology Progress Note  Patient seen in MICU. Wife at bedside. O2 sat 100% on high flow O2. Denies pain except pain related to Foley catheter. Has had some N/V since procedure of some yellowish fluid. No evidence currently of acute bleeding. No evidence of encephalopathy after TIPS.  Appreciate PCCM help in caring for Shawn Martin after TIPS/thora/para today.  [name]. Kathlene Cote, M.D Pager:  [phone]

## 2019-07-26 NOTE — Procedures (Signed)
Interventional Radiology Procedure Note  Procedure: TIPS, paracentesis, thoracentesis  Anesthesia: General   Complications: None  Estimated Blood Loss: 50 mL  Findings: Paracentesis performed yielding about 2.7 L ascites. Additional right thoracentesis yielded about 4.6 L fluid.  TIPS performed after percutaneous portal access and right IJ access. During procedure, additional "gun-sight" maneuver also performed to gain portal access.  10 mm Viatorr covered stent measuring 8 cm in total length with 6 cm covered segment successfully deployed and post dilated to 8 mm. Good flow through TIPS on completion.  During procedure, noted that right lung has consolidated appearance by fluoro with no significant reaccumulation of pleural fluid by [place]. Likely combination of atelectatis and post-expansion edema of right lung.   During TIPS, one maneuver did result in hepatic arterial puncture near right portal vein, likely intrahepatic. Hemodynamically stable and no signs of overt hemoperitoneum clinically. Will watch for any signs of bleeding post-op. Did not feel necessary to immediately perform hepatic arteriography via femoral access.  Plan: PACU recovery followed by admission. Given complexity of medical condition and potential difficulty with extubation given status of right lung, will ask [facility] for assistance with admission.  [name]. Kathlene Cote, M.D Pager:  [phone]

## 2019-07-26 NOTE — Progress Notes (Signed)
Updated wife by phone.  Erskine Emery MD PCCM

## 2019-07-26 NOTE — Anesthesia Procedure Notes (Signed)
Procedure Name: Intubation Date/Time: 07/26/2019 8:37 AM Performed by: Orlie Dakin, CRNA Pre-anesthesia Checklist: Emergency Drugs available, Patient identified, Suction available and Patient being monitored Patient Re-evaluated:Patient Re-evaluated prior to induction Oxygen Delivery Method: Circle system utilized Preoxygenation: Pre-oxygenation with 100% oxygen Induction Type: IV induction Ventilation: Mask ventilation without difficulty Laryngoscope Size: Miller and 3 Grade View: Grade I Tube type: Oral Tube size: 7.5 mm Number of attempts: 1 Airway Equipment and Method: Stylet Placement Confirmation: ETT inserted through vocal cords under direct vision,  positive ETCO2 and breath sounds checked- equal and bilateral Secured at: 22 cm Tube secured with: Tape Dental Injury: Teeth and Oropharynx as per pre-operative assessment  Comments: 4x4s bite block used.

## 2019-07-26 NOTE — Anesthesia Procedure Notes (Addendum)
Arterial Line Insertion Start/End4/27/2021 7:35 AM, 07/26/2019 7:55 AM Performed by: Verdie Drown, CRNA, CRNA  Patient location: Pre-op. Preanesthetic checklist: patient identified, IV checked, risks and benefits discussed, surgical consent, monitors and equipment checked, pre-op evaluation and timeout performed Lidocaine 1% used for infiltration and patient sedated Left, radial was placed Catheter size: 20 G Hand hygiene performed  and maximum sterile barriers used   Attempts: 4 Procedure performed without using ultrasound guided technique. Following insertion, dressing applied and Biopatch. Post procedure assessment: normal  Patient tolerated the procedure well with no immediate complications. Additional procedure comments: Multiple attempts at art line placement. Ultrasound attempt per Dr Royce Macadamia, unsuccessful placement on left.Marland Kitchen

## 2019-07-26 NOTE — Transfer of Care (Signed)
Immediate Anesthesia Transfer of Care Note  Patient: Shawn Martin  Procedure(s) Performed: TIPS (N/A )  Patient Location: PACU  Anesthesia Type:General  Level of Consciousness: awake, oriented and patient cooperative  Airway & Oxygen Therapy: Patient Spontanous Breathing and Patient connected to face mask oxygen  Post-op Assessment: Report given to RN, Post -op Vital signs reviewed and stable and Patient moving all extremities X 4  Post vital signs: Reviewed and stable  Last Vitals:  Vitals Value Taken Time  BP 93/67 07/26/19 1325  Temp    Pulse 104 07/26/19 1327  Resp 35 07/26/19 1327  SpO2 87 % 07/26/19 1327  Vitals shown include unvalidated device data.  Last Pain:  Vitals:   07/26/19 0647  TempSrc:   PainSc: 0-No pain      Patients Stated Pain Goal: 3 (01/74/94 4967)  Complications: No apparent anesthesia complications

## 2019-07-26 NOTE — Procedures (Signed)
Thoracentesis done in Interventional Radiology, right lower lobe.  5.2 L clear yellow fluid; patient tolerated well. Patient intubated/sedated for TIPS with Dr. Kathlene Cote.  No labs sent.  Roselyn Reef Elyas Villamor,NP

## 2019-07-26 NOTE — Progress Notes (Signed)
Patient was transported to 35M06 without any complications. Report was given to 35M RT.

## 2019-07-26 NOTE — H&P (Signed)
NAME:  Shawn Martin, MRN:  [id number], DOB:  1975/10/22, LOS: 0 ADMISSION DATE:  07/26/2019, CONSULTATION DATE:  07/26/2019 REFERRING MD:  Aletta Edouard, CHIEF COMPLAINT:  SOB   Brief History   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 PCCM consulted for admission due to worsening respiratory status.  History of present illness   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 Patient is s/p paracentesis yielding 2.4L, Thoracentesis yielding ~5L, and TIPS procedure. On fluoroscopy, during procedure, patient was noted to have consolidation of his RLL. Upon extubation patient noted to be tachypnic with increasing supplemental O2 requirements to 15L by NRB  Past Medical History  Cirrhosis Celiac  Significant Hospital Events   4/27: Admit 4/27: Thoracentesis (-2.4L), Paracentesis (-~5L), TIPS procedure  Consults:  IR  Procedures:  4/27: Thoracentesis (-2.4L) 4/27: Paracentesis (-~5L) 4/27: TIPS procedure  Significant Diagnostic Tests:  4/27 CXR: Pending  Micro Data:  4/1: Positive Asymptomatic Covid >> Will not retest  Antimicrobials:  Cefazolin 4/27 (Periprocedural)  Interim history/subjective:  Tachypnic when seen, able to speak in short sentences. POCUS showing moderate residual R pleural effusion without loculation nor signs of hemothorax.  Objective   Blood pressure 94/66, pulse (!) 103, temperature 97.6 F (36.4 C), resp. rate (!) 27, height 6' (1.829 m), weight 93 kg, SpO2 (!) 84 %.        Intake/Output Summary  (Last 24 hours) at 07/26/2019 1514 Last data filed at 07/26/2019 1328 Gross per 24 hour  Intake 3700 ml  Output 8150 ml  Net -4450 ml   Filed Weights   07/26/19 0626  Weight: 93 kg    Examination: General: Middle aged male in mild-mod distress HENT: NRB in place, anicteric Lungs: Clear on Left, reduced on right, absent right base, mild distress Cardiovascular: RRR, no r/m/g Abdomen: Soft, Nontender Extremities: No edema, warm, dry Neuro: Alert, conversational  Resolved Hospital Problem list     Assessment & Plan:   S/P Thoracentesis Acute hypoxic Respiratory Failure: In the setting of recent high volume thoracentesis of ~5L. Suspect re-expansion pulmonary edema. Aspiration is another possible consideration but unlikely. POCUS showed moderate residual R pleural effusion without loculation and no indications of hemothorax - Admit to ICU - Place on BiPAP, if fails to improve may requiring intubation - IV 25% Albumin 25g - PRN Zofran - CXR  Cirrhosis: History of recurrent Ascites requiring weekly paracentesis, presented initially for TIPS with IR. Today s/p thoracentesis (above), paracentesis yielding 2.4L today and TIPS procedure with 8m diameter, 8cm long stent. Of note, there was a puncture of an intrahepatic artery during the TIPS procedure, no periprocedural hemoperitoneum noted.  - Holding home Lasix and Spironolactone given soft BP and 40 mg IV lasix already ordered - SCDs for prophylaxis for now, follow Hgb  Best practice:  Diet: NPO Pain/Anxiety/Delirium protocol (if indicated): N/A VAP protocol (if indicated): N/A DVT prophylaxis: SCDs GI prophylaxis: PPI Glucose control: N/A Mobility: BR Code Status: Full Family Communication: Will attempt to call family Disposition: ICU  Labs   CBC: Recent Labs  Lab 07/26/19 1111  HGB  12.9*  HCT 38.0*    Basic Metabolic Panel: Recent Labs  Lab 07/26/19 0632 07/26/19 1111  NA 132* 134*  K 4.0 3.8  CL 100  --    CO2 22  --   GLUCOSE 99  --   BUN 14  --   CREATININE 1.06  --   CALCIUM 8.5*  --    GFR: Estimated Creatinine Clearance: 96.6 mL/min (by C-G formula based on SCr of 1.06 mg/dL). No results for input(s): PROCALCITON, WBC, LATICACIDVEN in the last 168 hours.  Liver Function Tests: No results for input(s): AST, ALT, ALKPHOS, BILITOT, PROT, ALBUMIN in the last 168 hours. No results for input(s): LIPASE, AMYLASE in the last 168 hours. No results for input(s): AMMONIA in the last 168 hours.  ABG    Component Value Date/Time   PHART 7.309 (L) 07/26/2019 1111   PCO2ART 42.8 07/26/2019 1111   PO2ART 78 (L) 07/26/2019 1111   HCO3 21.5 07/26/2019 1111   TCO2 23 07/26/2019 1111   ACIDBASEDEF 5.0 (H) 07/26/2019 1111   O2SAT 94.0 07/26/2019 1111     Coagulation Profile: Recent Labs  Lab 07/26/19 0632  INR 1.1    Cardiac Enzymes: No results for input(s): CKTOTAL, CKMB, CKMBINDEX, TROPONINI in the last 168 hours.  HbA1C: No results found for: HGBA1C  CBG: No results for input(s): GLUCAP in the last 168 hours.  Review of Systems:   Negative except as per HPI.  Past Medical History  He,  has a past medical history of Celiac disease, Hepatic cirrhosis ([place]), and Hypertension.   Surgical History    Past Surgical History:  Procedure Laterality Date  . IR PARACENTESIS  02/07/2019  . IR PARACENTESIS  05/25/2019  . IR PARACENTESIS  06/10/2019  . IR PARACENTESIS  06/15/2019  . IR PARACENTESIS  06/22/2019  . IR PARACENTESIS  07/01/2019  . IR PARACENTESIS  07/12/2019  . IR PARACENTESIS  07/19/2019  . IR PARACENTESIS  07/26/2019  . IR RADIOLOGIST EVAL & MGMT  07/07/2019  . IR THORACENTESIS ASP PLEURAL SPACE W/IMG GUIDE  07/26/2019  . IR TIPS  07/26/2019  . IR TRANSCATHETER BX  06/22/2019  . IR US GUIDE VASC ACCESS RIGHT  06/22/2019  . IR VENOGRAM HEPATIC WO HEMODYNAMIC EVALUATION  06/22/2019  . NOSE SURGERY       Social History   reports that he has never smoked. He quit smokeless  tobacco use about 2 years ago.  His smokeless tobacco use included chew. He reports previous alcohol use. He reports that he does not use drugs.   Family History   His family history includes Healthy in his mother; Heart attack in his father; Hypertension in his father; Leukemia in his brother. There is no history of Colon cancer, Stomach cancer, or Pancreatic cancer.   Allergies Allergies  Allergen Reactions  . Gluten Meal Diarrhea    bloating     Home Medications  Prior to Admission medications   Medication Sig Start Date End Date Taking? Authorizing Provider  cetirizine (ZYRTEC) 10 MG tablet Take 10 mg by mouth daily.   Yes [provider]  ferrous sulfate 325 (65 FE) MG tablet Take 325 mg by mouth daily.   Yes [provider]  furosemide (LASIX) 40 MG tablet Take 1 tablet (40 mg total) by mouth 2 (two) times daily. 07/04/19  Yes Thurnell Lose, MD  magnesium gluconate (MAGONATE) 500 MG tablet Take 500 mg by mouth daily as needed (cramps).    [provider]  Multiple Vitamin (MULTIVITAMIN WITH MINERALS) TABS tablet Take 1 tablet by mouth daily.    [provider]  spironolactone (ALDACTONE) 100 MG tablet Take 1 tablet (100 mg total) by mouth daily. 07/08/19   Armbruster, [name], MD     Critical care time:   Pearson Grippe, DO IM PGY-3

## 2019-07-26 NOTE — Anesthesia Postprocedure Evaluation (Signed)
Anesthesia Post Note  Patient: Shawn Martin  Procedure(s) Performed: TIPS (N/A )     Patient location during evaluation: PACU Anesthesia Type: General Level of consciousness: awake and alert and oriented Pain management: pain level controlled Vital Signs Assessment: post-procedure vital signs reviewed and stable Respiratory status: spontaneous breathing, nonlabored ventilation, respiratory function stable, non-rebreather facemask and respiratory function unstable Cardiovascular status: tachycardic and unstable Postop Assessment: no apparent nausea or vomiting Anesthetic complications: no Comments: Patient seen by CCM. Transferred to ICU.    Last Vitals:  Vitals:   07/26/19 1508 07/26/19 1520  BP: 94/66 (!) 100/51  Pulse: (!) 103 98  Resp: (!) 36 (!) 35  Temp:    SpO2: 92% 99%    Last Pain:  Vitals:   07/26/19 1450  TempSrc:   PainSc: 0-No pain                 [name]

## 2019-07-26 NOTE — Procedures (Signed)
IR paracentesis, right lower quadrant.  2.4L clear yellow fluid removed.   Labs not sent. Patient tolerated well; he was intubated/sedated for TIPS by Dr. Kathlene Cote.  Soyla Dryer, NP

## 2019-07-26 NOTE — H&P (Addendum)
Chief Complaint: Patient was seen in consultation today for Transjugular intrahepatic portal system shunt placement at the request of Dr Doyne Keel   Supervising Physician: Aletta Edouard  Patient Status: [facility] - Out-pt  History of Present Illness: Shawn Martin is a 44 y.o. male   Alcohol cirrhosis; Celiac disease; esophageal varices Portal hypertensive gastropathy Refractory ascites Several large volume paracentesis-- most recent 4/20: 5 L;  4/13: 13 L Has stopped drinking alcohol - now 3 mo  TJ Liver bx 06/22/19: A. LIVER, RIGHT, NEEDLE CORE BIOPSY:  - Cirrhosis (stage 4 of 4) EGD on 02/08/2019 showed grade II non-bleeding varices in middle and lower third of esophagus and portal hypertensive gastropathy in fundus and body of stomach. Not candidate for liver transplant: less 6 mo alcohol free   Referred to Dr Kathlene Cote from Dr Doyne Keel and D Drazek NP- for consideration of TIPs procedure Was seen in consultation 07/07/19 I spoke with [name] and discussed possible TIPS with him. His wife was also on the phone call. We discussed details of TIPS, risks of the procedure, and possible outcomes. He currently has a MELD-Na score of 10 but has had some more significant lab abnormalities in the past that appear to be improving. He is very eager to improve his ascites as it affects his daily activities, ability to breath and appetite. 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 I reviewed prior ultrasound studies in 2020 that appear to show flow in the portal vein, but the studies are not very good and the portal vein appears potentially small with slow flow. He will need a good CT evaluation of the abdomen with multi-phase imaging to evaluate his anatomy prior to TIPS. We will schedule a CT. Should his CT demonstrate amenable anatomy and pre-op labs show stable MELD score, I told  [name] that we should be able to schedule a TIPS procedure in the next few weeks. He may need one or more interval paracentesis procedures in the meantime. He would like to proceed.  Scheduled now for TIPs procedure Hx Covid + 06/30/19    Past Medical History:  Diagnosis Date  . Celiac disease   . Hepatic cirrhosis ([place])   . Hypertension     Past Surgical History:  Procedure Laterality Date  . IR PARACENTESIS  02/07/2019  . IR PARACENTESIS  05/25/2019  . IR PARACENTESIS  06/10/2019  . IR PARACENTESIS  06/15/2019  . IR PARACENTESIS  06/22/2019  . IR PARACENTESIS  07/01/2019  . IR PARACENTESIS  07/12/2019  . IR PARACENTESIS  07/19/2019  . IR RADIOLOGIST EVAL & MGMT  07/07/2019  . IR TRANSCATHETER BX  06/22/2019  . IR US GUIDE VASC ACCESS RIGHT  06/22/2019  . IR VENOGRAM HEPATIC WO HEMODYNAMIC EVALUATION  06/22/2019  . NOSE SURGERY      Allergies: Gluten meal  Medications: Prior to Admission medications   Medication Sig Start Date End Date Taking? Authorizing Provider  cetirizine (ZYRTEC) 10 MG tablet Take 10 mg by mouth daily.   Yes [provider]  ferrous sulfate 325 (65 FE) MG tablet Take 325 mg by mouth daily.   Yes [provider]  furosemide (LASIX) 40 MG tablet Take 1 tablet (40 mg total) by mouth 2 (two) times daily. 07/04/19  Yes Thurnell Lose, MD  magnesium gluconate (MAGONATE) 500 MG tablet Take 500 mg by mouth  daily as needed (cramps).    [provider]  Multiple Vitamin (MULTIVITAMIN WITH MINERALS) TABS tablet Take 1 tablet by mouth daily.    [provider]  spironolactone (ALDACTONE) 100 MG tablet Take 1 tablet (100 mg total) by mouth daily. 07/08/19   Armbruster, [name], MD     Family History  Problem Relation Age of Onset  . Healthy Mother   . Hypertension Father   . Heart attack Father   . Leukemia Brother   . Colon cancer Neg Hx   . Stomach cancer Neg Hx   . Pancreatic cancer Neg Hx     Social History    Socioeconomic History  . Marital status: Married    Spouse name: Not on file  . Number of children: Not on file  . Years of education: Not on file  . Highest education level: Not on file  Occupational History  . Not on file  Tobacco Use  . Smoking status: Never Smoker  . Smokeless tobacco: Former Systems developer    Types: Chew  Substance and Sexual Activity  . Alcohol use: Not Currently    Comment: every day drinker, 6 pack per day  . Drug use: Never  . Sexual activity: Yes    Partners: Female  Other Topics Concern  . Not on file  Social History Narrative  . Not on file   Social Determinants of Health   Financial Resource Strain:   . Difficulty of Paying Living Expenses:   Food Insecurity:   . Worried About Charity fundraiser in the Last Year:   . Arboriculturist in the Last Year:   Transportation Needs:   . Film/video editor (Medical):   Marland Kitchen Lack of Transportation (Non-Medical):   Physical Activity:   . Days of Exercise per Week:   . Minutes of Exercise per Session:   Stress:   . Feeling of Stress :   Social Connections:   . Frequency of Communication with Friends and Family:   . Frequency of Social Gatherings with Friends and Family:   . Attends Religious Services:   . Active Member of Clubs or Organizations:   . Attends Archivist Meetings:   Marland Kitchen Marital Status:     Review of Systems: A 12 point ROS discussed and pertinent positives are indicated in the HPI above.  All other systems are negative.  Review of Systems  Constitutional: Positive for activity change, appetite change and fatigue. Negative for fever.  HENT: Negative for trouble swallowing.   Eyes: Negative for visual disturbance.  Respiratory: Positive for shortness of breath. Negative for cough.   Cardiovascular: Negative for chest pain.  Gastrointestinal: Positive for abdominal distention. Negative for abdominal pain and nausea.  Musculoskeletal: Negative for gait problem.  Neurological:  Negative for weakness.  Psychiatric/Behavioral: Negative for behavioral problems and confusion.    Vital Signs: BP 133/90   Pulse 90   Temp 98.6 F (37 C) (Oral)   Resp 18   Ht 6' (1.829 m)   Wt 205 lb (93 kg)   SpO2 100%   BMI 27.80 kg/m   Physical Exam Vitals reviewed.  Cardiovascular:     Rate and Rhythm: Normal rate and regular rhythm.     Heart sounds: Normal heart sounds.  Pulmonary:     Effort: Pulmonary effort is normal.     Breath sounds: Normal breath sounds.  Abdominal:     General: There is distension.     Palpations: Abdomen  is soft.     Tenderness: There is no abdominal tenderness.  Musculoskeletal:        General: Normal range of motion.  Skin:    General: Skin is warm and dry.  Neurological:     Mental Status: He is alert and oriented to person, place, and time.  Psychiatric:        Mood and Affect: Mood normal.        Behavior: Behavior normal.        Thought Content: Thought content normal.        Judgment: Judgment normal.     Imaging: DG Chest 2 View  Result Date: 07/15/2019 CLINICAL DATA:  Post right thoracentesis EXAM: CHEST - 2 VIEW COMPARISON:  11/14/2019 FINDINGS: Marked improvement in right pleural effusion. Small residual right effusion remains with right lower lobe atelectasis. No pneumothorax Left lung is clear. No effusion on the left. No heart failure or edema IMPRESSION: Marked improvement in right pleural effusion.  No pneumothorax. Electronically Signed   By: Franchot Gallo M.D.   On: 07/15/2019 13:14   DG Chest 2 View  Result Date: 07/15/2019 CLINICAL DATA:  Shortness of breath EXAM: CHEST - 2 VIEW COMPARISON:  06/30/2019 FINDINGS: Large right pleural effusion with near-complete opacification of the right hemithorax. Bandlike opacity in the left lung base. No left-sided pleural effusion. Cardiac silhouette is largely obscured. No pneumothorax. IMPRESSION: 1. Large right pleural effusion. 2. Bandlike opacity in the left lung base,  atelectasis versus consolidation. Electronically Signed   By: Davina Poke D.O.   On: 07/15/2019 09:24   US Paracentesis  Result Date: 07/03/2019 INDICATION: Recurrent large volume ascites secondary to cirrhosis. Request for therapeutic paracentesis. EXAM: ULTRASOUND GUIDED PARACENTESIS MEDICATIONS: 1% lidocaine 10 mL COMPLICATIONS: None immediate. PROCEDURE: Informed written consent was obtained from the patient after a discussion of the risks, benefits and alternatives to treatment. A timeout was performed prior to the initiation of the procedure. Initial ultrasound scanning demonstrates a large amount of ascites within the left lateral abdomen. The left lateral abdomen was prepped and draped in the usual sterile fashion. 1% lidocaine was used for local anesthesia. Following this, a 19 gauge, 7-cm, Yueh catheter was introduced. An ultrasound image was saved for documentation purposes. The paracentesis was performed. The catheter was removed and a dressing was applied. The patient tolerated the procedure well without immediate post procedural complication. Patient received post-procedure intravenous albumin; see nursing notes for details. FINDINGS: A total of approximately 8.4 L of clear yellow fluid was removed. IMPRESSION: Successful ultrasound-guided paracentesis yielding 8.4 liters of peritoneal fluid. Read by: Gareth Eagle, PA-C Electronically Signed   By: Jerilynn Mages.  Shick M.D.   On: 07/03/2019 12:29   DG Chest Portable 1 View  Result Date: 06/30/2019 CLINICAL DATA:  Shortness of breath EXAM: PORTABLE CHEST 1 VIEW COMPARISON:  None. FINDINGS: Note shallow degree of inspiration. There is a rather minimal pleural effusion on each side. There is slight bibasilar atelectasis. The lungs elsewhere are clear. Heart size and pulmonary vascularity are normal. No adenopathy. No bone lesions. IMPRESSION: Rather minimal pleural effusions bilaterally with bibasilar atelectasis. Lungs elsewhere clear. Cardiac silhouette  normal. Electronically Signed   By: Lowella Grip III M.D.   On: 06/30/2019 13:23   VAS [place] LOWER EXTREMITY VENOUS (DVT)  Result Date: 07/03/2019  Lower Venous DVTStudy Indications: Swelling.  Comparison Study: No prior exam. Performing Technologist: Baldwin Crown ARDMS, RVT  Examination Guidelines: A complete evaluation includes B-mode imaging, spectral Doppler, color Doppler, and  power Doppler as needed of all accessible portions of each vessel. Bilateral testing is considered an integral part of a complete examination. Limited examinations for reoccurring indications may be performed as noted. The reflux portion of the exam is performed with the patient in reverse Trendelenburg.  +---------+---------------+---------+-----------+----------+--------------+ RIGHT    CompressibilityPhasicitySpontaneityPropertiesThrombus Aging +---------+---------------+---------+-----------+----------+--------------+ CFV      Full           Yes      Yes                                 +---------+---------------+---------+-----------+----------+--------------+ SFJ      Full                                                        +---------+---------------+---------+-----------+----------+--------------+ FV Prox  Full                                                        +---------+---------------+---------+-----------+----------+--------------+ FV Mid   Full                                                        +---------+---------------+---------+-----------+----------+--------------+ FV DistalFull                                                        +---------+---------------+---------+-----------+----------+--------------+ PFV      Full                                                        +---------+---------------+---------+-----------+----------+--------------+ POP      Full           Yes      Yes                                  +---------+---------------+---------+-----------+----------+--------------+ PTV      Full                                                        +---------+---------------+---------+-----------+----------+--------------+ PERO     Full                                                        +---------+---------------+---------+-----------+----------+--------------+   +---------+---------------+---------+-----------+----------+--------------+  LEFT     CompressibilityPhasicitySpontaneityPropertiesThrombus Aging +---------+---------------+---------+-----------+----------+--------------+ CFV      Full           Yes      Yes                                 +---------+---------------+---------+-----------+----------+--------------+ SFJ      Full                                                        +---------+---------------+---------+-----------+----------+--------------+ FV Prox  Full                                                        +---------+---------------+---------+-----------+----------+--------------+ FV Mid   Full                                                        +---------+---------------+---------+-----------+----------+--------------+ FV DistalFull                                                        +---------+---------------+---------+-----------+----------+--------------+ PFV      Full                                                        +---------+---------------+---------+-----------+----------+--------------+ POP      Full           Yes      Yes                                 +---------+---------------+---------+-----------+----------+--------------+ PTV      Full                                                        +---------+---------------+---------+-----------+----------+--------------+ PERO     Full                                                         +---------+---------------+---------+-----------+----------+--------------+     Summary: BILATERAL: - No evidence of deep vein thrombosis seen in the lower extremities, bilaterally.  RIGHT: - No cystic structure found in the popliteal fossa.  LEFT: - No cystic structure found in the popliteal fossa.  *See table(s) above for measurements  and observations. Electronically signed by Curt Jews MD on 07/03/2019 at 8:18:32 AM.    Final    ECHOCARDIOGRAM LIMITED  Result Date: 07/04/2019    ECHOCARDIOGRAM LIMITED REPORT   Patient Name:   Juventino FAIN Martin Date of Exam: 07/04/2019 Medical Rec #:  [id number]            Height:       72.0 in Accession #:    [id number]           Weight:       209.4 lb Date of Birth:  1975/08/05             BSA:          2.173 m Patient Age:    89 years             BP:           105/68 mmHg Patient Gender: M                    HR:           83 bpm. Exam Location:  Inpatient Procedure: Limited Echo, Limited Color Doppler and Cardiac Doppler Indications:    CHF-Acute Diastolic D14.97  History:        Patient has no prior history of Echocardiogram examinations.                 Hepatic cirrhosis; Risk Factors:Hypertension. COVID-19 Positive.  Sonographer:    Mikki Santee RDCS (AE) Referring Phys: [id number] Margaree Mackintosh [place]  1. The LV is hyperdynamic with LVEF 70-75%. There is an intracavitary gradient of 12 mmHG. There is chordal SAM. There is no asymmetric LVH to suggest hypertrophic cardiomyopathy. These findings are related to the hyperdynamic LV. There is pseudodyskinesis of the inferior wall, likely related to increased intraabdominal pressure in this patient with liver disease (normal finding). Left ventricular ejection fraction, by estimation, is 70 to 75%. The left ventricle has hyperdynamic function.  The left ventricle has no regional wall motion abnormalities. Left ventricular diastolic parameters were normal.  2. Right ventricular systolic function is normal. The right  ventricular size is mildly enlarged. There is normal pulmonary artery systolic pressure. The estimated right ventricular systolic pressure is 02.6 mmHg.  3. The mitral valve is grossly normal. Mild mitral valve regurgitation. No evidence of mitral stenosis.  4. The aortic valve is tricuspid. Aortic valve regurgitation is not visualized. No aortic stenosis is present.  5. The inferior vena cava is normal in size with <50% respiratory variability, suggesting right atrial pressure of 8 mmHg. FINDINGS  Left Ventricle: The LV is hyperdynamic with LVEF 70-75%. There is an intracavitary gradient of 12 mmHG. There is chordal SAM. There is no asymmetric LVH to suggest hypertrophic cardiomyopathy. These findings are related to the hyperdynamic LV. There is pseudodyskinesis of the inferior wall, likely related to increased intraabdominal pressure in this patient with liver disease (normal finding). Left ventricular ejection fraction, by estimation, is 70 to 75%. The left ventricle has hyperdynamic function.  The left ventricle has no regional wall motion abnormalities. The left ventricular internal cavity size was normal in size. There is no left ventricular hypertrophy. Normal left ventricular filling pressure. Right Ventricle: The right ventricular size is mildly enlarged. No increase in right ventricular wall thickness. Right ventricular systolic function is normal. There is normal pulmonary artery systolic pressure. The tricuspid regurgitant velocity is 2.05  m/s, and with an assumed right atrial pressure of 8 mmHg,  the estimated right ventricular systolic pressure is 11.1 mmHg. Left Atrium: Left atrial size was normal in size. Right Atrium: Right atrial size was normal in size. Pericardium: There is no evidence of pericardial effusion. Mitral Valve: The mitral valve is grossly normal. Mild mitral valve regurgitation. No evidence of mitral valve stenosis. Tricuspid Valve: The tricuspid valve is grossly normal. Tricuspid  valve regurgitation is trivial. No evidence of tricuspid stenosis. Aortic Valve: The aortic valve is tricuspid. Aortic valve regurgitation is not visualized. No aortic stenosis is present. Pulmonic Valve: The pulmonic valve was grossly normal. Pulmonic valve regurgitation is not visualized. Venous: The inferior vena cava is normal in size with less than 50% respiratory variability, suggesting right atrial pressure of 8 mmHg. Additional Comments: There is a small pleural effusion in the left lateral region.  LEFT VENTRICLE PLAX 2D LVIDd:         4.20 cm  Diastology LVIDs:         2.60 cm  LV e' lateral:   13.10 cm/s LV PW:         1.00 cm  LV E/e' lateral: 5.6 LV IVS:        1.00 cm  LV e' medial:    8.92 cm/s LVOT diam:     2.40 cm  LV E/e' medial:  8.3 LV SV:         161 LV SV Index:   74 LVOT Area:     4.52 cm  LEFT ATRIUM             Index       RIGHT ATRIUM           Index LA diam:        3.90 cm 1.79 cm/m  RA Area:     15.30 cm LA Vol (A2C):   36.0 ml 16.57 ml/m RA Volume:   32.30 ml  14.87 ml/m LA Vol (A4C):   44.1 ml 20.30 ml/m LA Biplane Vol: 43.9 ml 20.21 ml/m  AORTIC VALVE LVOT Vmax:   155.00 cm/s LVOT Vmean:  104.000 cm/s LVOT VTI:    0.356 m  AORTA Ao Root diam: 3.20 cm MITRAL VALVE               TRICUSPID VALVE MV Area (PHT): 2.20 cm    TR Peak grad:   16.8 mmHg MV Decel Time: 345 msec    TR Vmax:        205.00 cm/s MR Peak grad: 112.4 mmHg MR Vmax:      530.00 cm/s  SHUNTS MV E velocity: 73.70 cm/s  Systemic VTI:  0.36 m MV A velocity: 62.60 cm/s  Systemic Diam: 2.40 cm MV E/A ratio:  1.18 Eleonore Chiquito MD Electronically signed by Eleonore Chiquito MD Signature Date/Time: 07/04/2019/3:46:56 PM    Final    IR Radiologist Eval & Mgmt  Result Date: 07/07/2019 Please refer to notes tab for details about interventional procedure. (Op Note)  IR Paracentesis  Result Date: 07/19/2019 INDICATION: Recurrent ascites.  Request for therapeutic paracentesis. EXAM: ULTRASOUND GUIDED RIGHT LOWER QUADRANT  PARACENTESIS MEDICATIONS: None. COMPLICATIONS: None immediate. PROCEDURE: Informed written consent was obtained from the patient after a discussion of the risks, benefits and alternatives to treatment. A timeout was performed prior to the initiation of the procedure. Initial ultrasound scanning demonstrates a large amount of ascites within the right lower abdominal quadrant. The right lower abdomen was prepped and draped in the usual sterile fashion. 1% lidocaine was used for local anesthesia.  Following this, a 19 gauge, 7-cm, Yueh catheter was introduced. An ultrasound image was saved for documentation purposes. The paracentesis was performed. The catheter was removed and a dressing was applied. The patient tolerated the procedure well without immediate post procedural complication. Patient received post-procedure intravenous albumin; see nursing notes for details. FINDINGS: A total of approximately 5 L of clear yellow fluid was removed. IMPRESSION: Successful ultrasound-guided paracentesis yielding 5 liters of peritoneal fluid. Read by: Ascencion Dike PA-C Electronically Signed   By: Lucrezia Europe M.D.   On: 07/19/2019 15:41   IR Paracentesis  Result Date: 07/12/2019 INDICATION: Patient history of alcoholic cirrhosis with recurrent ascites presents for therapeutic paracentesis. Maximum of 13 L. EXAM: ULTRASOUND GUIDED THERAPEUTIC PARACENTESIS MEDICATIONS: Lidocaine 1% 10 mL COMPLICATIONS: None immediate. PROCEDURE: Informed written consent was obtained from the patient after a discussion of the risks, benefits and alternatives to treatment. A timeout was performed prior to the initiation of the procedure. Initial ultrasound scanning demonstrates a large amount of ascites within the right lower abdominal quadrant. The right lower abdomen was prepped and draped in the usual sterile fashion. 1% lidocaine was used for local anesthesia. Following this, a 6 Fr Safe-T-Centesis catheter was introduced. An ultrasound image  was saved for documentation purposes. The paracentesis was performed. The catheter was removed and a dressing was applied. The patient tolerated the procedure well without immediate post procedural complication. Patient received post-procedure intravenous albumin; see nursing notes for details. FINDINGS: A total of approximately 13 L of straw-colored fluid was removed. IMPRESSION: Successful ultrasound-guided therapeutic paracentesis yielding 13 liters of peritoneal fluid. Read by Rushie Nyhan NP Electronically Signed   By: Markus Daft M.D.   On: 07/12/2019 16:50   IR Paracentesis  Result Date: 07/01/2019 INDICATION: Patient with history of cirrhosis, recurrent ascites requiring frequent large volume paracentesis. Request to IR for diagnostic and therapeutic paracentesis with 10 L maximum. EXAM: ULTRASOUND GUIDED DIAGNOSTIC AND THERAPEUTIC PARACENTESIS MEDICATIONS: 10 mL 1% lidocaine COMPLICATIONS: None immediate. PROCEDURE: Informed written consent was obtained from the patient after a discussion of the risks, benefits and alternatives to treatment. A timeout was performed prior to the initiation of the procedure. Initial ultrasound scanning demonstrates a large amount of ascites within the right lower abdominal quadrant. The right lower abdomen was prepped and draped in the usual sterile fashion. 1% lidocaine was used for local anesthesia. Following this, a 19 gauge, 7-cm, Yueh catheter was introduced. An ultrasound image was saved for documentation purposes. The paracentesis was performed. The catheter was removed and a dressing was applied. The patient tolerated the procedure well without immediate post procedural complication. Patient received post-procedure intravenous albumin; see nursing notes for details. FINDINGS: A total of approximately 10.0 L of hazy yellow fluid was removed. Samples were sent to the laboratory as requested by the clinical team. IMPRESSION: Successful ultrasound-guided  paracentesis yielding 10.0 liters of peritoneal fluid. Read by Candiss Norse, PA-C Electronically Signed   By: Jerilynn Mages.  Shick M.D.   On: 07/01/2019 13:27    Labs:  CBC: Recent Labs    07/03/19 0620 07/04/19 0235 07/15/19 0832 07/19/19 1411  WBC 4.3 3.9* 5.4 3.9*  HGB 11.0* 10.7* 12.3* 12.9*  HCT 32.4* 30.9* 35.3* 37.4*  PLT 151 129* 175 178    COAGS: Recent Labs    06/30/19 1420 07/01/19 0818 07/02/19 0314 07/03/19 0620 07/04/19 0235 07/19/19 1411  INR 1.2   < > 1.2 1.3* 1.3* 1.1  APTT 34  --   --   --   --   --    < > =  values in this interval not displayed.    BMP: Recent Labs    07/04/19 0235 07/07/19 1356 07/15/19 0832 07/19/19 1411  NA 130* 133* 126* 131*  K 4.3 5.4* 4.9 4.6  CL 99 98 96* 99  CO2 23 24 22 25   GLUCOSE 96 93 110* 113*  BUN 10 18 14 10   CALCIUM 8.1* 8.4* 8.3* 8.5*  CREATININE 0.84 1.01 0.97 1.14  GFRNONAA >60 89 >60 >60  GFRAA >60 103 >60 >60    LIVER FUNCTION TESTS: Recent Labs    07/02/19 0314 07/03/19 0620 07/04/19 0235 07/19/19 1411  BILITOT 1.9* 1.4* 1.3* 1.0  AST 51* 59* 50* 75*  ALT 29 32 29 42  ALKPHOS 73 81 73 117  PROT 5.7* 5.4* 4.9* 6.2*  ALBUMIN 2.7*  2.7* 2.4* 2.3* 2.6*    TUMOR MARKERS: Recent Labs    01/28/19 1558  AFPTM 4.3    Assessment and Plan:  ETOH ascites; multiple large volume paracentesis Refractory ascites Has been seen in consult with Dr Kathlene Cote regarding Transjugular Intrahepatic Portal System Shunt placement Now scheduled for same Risks and benefits of TIPS, BRTO and/or additional variceal embolization were discussed with the patient and/or the patient's family including, but not limited to, infection, bleeding, damage to adjacent structures, worsening hepatic and/or cardiac function, worsening and/or the development of altered mental status/encephalopathy, non-target embolization and death.   This interventional procedure involves the use of X-rays and because of the nature of the planned  procedure, it is possible that we will have prolonged use of X-ray fluoroscopy.  Potential radiation risks to you include (but are not limited to) the following: - A slightly elevated risk for cancer  several years later in life. This risk is typically less than 0.5% percent. This risk is low in comparison to the normal incidence of human cancer, which is 33% for women and 50% for men according to the [place]. - Radiation induced injury can include skin redness, resembling a rash, tissue breakdown / ulcers and hair loss (which can be temporary or permanent).   The likelihood of either of these occurring depends on the difficulty of the procedure and whether you are sensitive to radiation due to previous procedures, disease, or genetic conditions.   IF your procedure requires a prolonged use of radiation, you will be notified and given written instructions for further action.  It is your responsibility to monitor the irradiated area for the 2 weeks following the procedure and to notify your physician if you are concerned that you have suffered a radiation induced injury.    All of the patient's questions were answered, patient is agreeable to proceed.  Consent signed and in chart. Pt is aware he weill be admitted after intervention overnight for observation.  Plan for discharge in am He is agreeable.  Thank you for this interesting consult.  I greatly enjoyed meeting Ladon Fain Martin and look forward to participating in their care.  A copy of this report was sent to the requesting provider on this date.  Electronically Signed: Lavonia Drafts, PA-C 07/26/2019, 6:57 AM   I spent a total of    25 Minutes in face to face in clinical consultation, greater than 50% of which was counseling/coordinating care for TIPs

## 2019-07-27 ENCOUNTER — Inpatient Hospital Stay (HOSPITAL_COMMUNITY): Payer: Self-pay

## 2019-07-27 ENCOUNTER — Other Ambulatory Visit: Payer: Self-pay | Admitting: Physician Assistant

## 2019-07-27 DIAGNOSIS — K7031 Alcoholic cirrhosis of liver with ascites: Secondary | ICD-10-CM

## 2019-07-27 LAB — CBC
HCT: 29.3 % — ABNORMAL LOW (ref 39.0–52.0)
HCT: 29.3 % — ABNORMAL LOW (ref 39.0–52.0)
Hemoglobin: 10 g/dL — ABNORMAL LOW (ref 13.0–17.0)
Hemoglobin: 10 g/dL — ABNORMAL LOW (ref 13.0–17.0)
MCH: 31.4 pg (ref 26.0–34.0)
MCH: 32.1 pg (ref 26.0–34.0)
MCHC: 34.1 g/dL (ref 30.0–36.0)
MCHC: 34.1 g/dL (ref 30.0–36.0)
MCV: 92.1 fL (ref 80.0–100.0)
MCV: 93.9 fL (ref 80.0–100.0)
Platelets: 115 10*3/uL — ABNORMAL LOW (ref 150–400)
Platelets: 105 10*3/uL — ABNORMAL LOW (ref 150–400)
RBC: 3.18 MIL/uL — ABNORMAL LOW (ref 4.22–5.81)
RBC: 3.12 MIL/uL — ABNORMAL LOW (ref 4.22–5.81)
RDW: 18.2 % — ABNORMAL HIGH (ref 11.5–15.5)
RDW: 18.6 % — ABNORMAL HIGH (ref 11.5–15.5)
WBC: 8.9 10*3/uL (ref 4.0–10.5)
WBC: 8.9 10*3/uL (ref 4.0–10.5)
nRBC: 0 % (ref 0.0–0.2)
nRBC: 0 % (ref 0.0–0.2)

## 2019-07-27 LAB — COMPREHENSIVE METABOLIC PANEL
ALT: 31 U/L (ref 0–44)
AST: 66 U/L — ABNORMAL HIGH (ref 15–41)
Albumin: 2.8 g/dL — ABNORMAL LOW (ref 3.5–5.0)
Alkaline Phosphatase: 57 U/L (ref 38–126)
Anion gap: 12 (ref 5–15)
BUN: 15 mg/dL (ref 6–20)
CO2: 19 mmol/L — ABNORMAL LOW (ref 22–32)
Calcium: 8.5 mg/dL — ABNORMAL LOW (ref 8.9–10.3)
Chloride: 106 mmol/L (ref 98–111)
Creatinine, Ser: 1.05 mg/dL (ref 0.61–1.24)
GFR calc Af Amer: >60 mL/min (ref >60)
GFR calc non Af Amer: >60 mL/min (ref >60)
Glucose, Bld: 118 mg/dL — ABNORMAL HIGH (ref 70–99)
Potassium: 4.3 mmol/L (ref 3.5–5.1)
Sodium: 137 mmol/L (ref 135–145)
Total Bilirubin: 1.8 mg/dL — ABNORMAL HIGH (ref 0.3–1.2)
Total Protein: 4.6 g/dL — ABNORMAL LOW (ref 6.5–8.1)

## 2019-07-27 LAB — MAGNESIUM: Magnesium: 1.7 mg/dL (ref 1.7–2.4)

## 2019-07-27 NOTE — Progress Notes (Signed)
   07/27/19 1731  Vitals  Temp 97.9 F (36.6 C)  Temp Source Oral  BP 113/72  MAP (mmHg) 84  BP Location Right Arm  BP Method Automatic  Patient Position (if appropriate) Sitting  ECG Heart Rate 99  Resp 20  Oxygen Therapy  SpO2 96 %  O2 Device Room Air    Patient arrived to 3W29, alert and oriented, states no pain. Telemetry verified.

## 2019-07-27 NOTE — Progress Notes (Signed)
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 male with a history of alcoholic cirrhosis.  He is status post multiple large volume paracentesis procedures since last November and has had several procedures in March and one on 07/03/19.   A transjugular liver biopsy on 06/22/19 showed cirrhosis (stage 4 of 4).   He is followed by Dr. Havery Moros and Roosevelt Locks, NP at [facility].   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   He has not had a documented variceal bleed in the past. Liver function has improved after discontinuation of alcohol. He has never had any signs or symptoms of encephalopathy.  Due to current low MELD-Na score of 10 and <6 month alcohol abstinence, he does not currently qualify for liver transplantation.  He was evaluated by Dr. Kathlene Cote on 07/07/19 and was deemed a good candidate for TIPS to treat his refractory ascites.  He underwent successful TIPS yesterday complicated by intra hepatic arterial puncture near the right portal vein.  He was transferred to ICU for close monitoring post-procedure.  He also had a paracentesis and right thoracentesis yesterday.  Subjective:  He is doing great this morning. He says he feels 1000 times better than yesterday.  He has no signs of encephalopathy. He is not having any pain.  Allergies: Gluten meal  Medications: Prior to Admission medications   Medication Sig Start Date End Date Taking? Authorizing Provider  cetirizine (ZYRTEC) 10 MG tablet Take 10 mg by mouth daily.   Yes [provider]  ferrous sulfate 325 (65 FE) MG tablet Take 325 mg by mouth daily.   Yes [provider]    furosemide (LASIX) 40 MG tablet Take 1 tablet (40 mg total) by mouth 2 (two) times daily. 07/04/19  Yes Thurnell Lose, MD  magnesium gluconate (MAGONATE) 500 MG tablet Take 500 mg by mouth daily as needed (cramps).    [provider]  Multiple Vitamin (MULTIVITAMIN WITH MINERALS) TABS tablet Take 1 tablet by mouth daily.    [provider]  spironolactone (ALDACTONE) 100 MG tablet Take 1 tablet (100 mg total) by mouth daily. 07/08/19   [name], MD     Vital Signs: BP 100/82   Pulse 95   Temp 98.1 F (36.7 C) (Oral)   Resp (!) 21   Ht 6' (1.829 m)   Wt 82.3 kg   SpO2 100%   BMI 24.61 kg/m   Physical Exam Vitals reviewed.  Constitutional:      Appearance: Normal appearance.  HENT:     Head: Normocephalic and atraumatic.  Neck:     Comments: Right IJ stick site looks good, no bleeding. Cardiovascular:     Rate and Rhythm: Normal rate.  Pulmonary:     Effort: Pulmonary effort is normal. No respiratory distress.  Abdominal:     Palpations: Abdomen is soft.     Tenderness: There is no abdominal tenderness.     Comments: RUQ stick sites looks good, no bleeding.  Skin:    General: Skin is warm and dry.  Neurological:     General: No focal deficit present.     Mental Status: He is alert and oriented to person, place, and  time.  Psychiatric:        Mood and Affect: Mood normal.        Behavior: Behavior normal.        Thought Content: Thought content normal.        Judgment: Judgment normal.     Imaging: CT ABDOMEN PELVIS W WO CONTRAST  Result Date: 07/26/2019 CLINICAL DATA:  Cirrhosis, refractory ascites and recent right thoracentesis in Emergency Department on 07/15/2019 with removal 5 L of fluid. EXAM: CT ABDOMEN AND PELVIS WITHOUT AND WITH CONTRAST TECHNIQUE: Multidetector CT imaging of the abdomen and pelvis was performed following the standard protocol before and following the bolus administration of intravenous contrast. CONTRAST:   160m OMNIPAQUE IOHEXOL 350 MG/ML SOLN COMPARISON:  No prior CT studies. FINDINGS: Lower chest: Large right pleural effusion causes complete compressive atelectasis of the right middle lobe and right lower lobe. Fluid appears simple in density and measures the same density as ascites in the peritoneal cavity. 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 Hepatobiliary: Evidence of cirrhosis with shrunken and nodular liver. No masses or abnormal enhancement on arterial or venous phases of imaging. Pancreas: Unremarkable. No pancreatic ductal dilatation or surrounding inflammatory changes. Spleen: Normal in size without focal abnormality. Adrenals/Urinary Tract: Adrenal glands are unremarkable. Kidneys are normal, without renal calculi, focal lesion, or hydronephrosis. Bladder is unremarkable. Stomach/Bowel: Bowel shows no evidence of obstruction, ileus or inflammation. No free air identified. No focal lesions identified by CT. Vascular/Lymphatic: Arterial structures showed no significant abnormalities. There is minimal calcified plaque in the abdominal aorta without aneurysm. Venous evaluation demonstrates normal patency of the portal vein without evidence of thrombus. Mesenteric veins and the splenic vein are patent. IVC, hepatic veins, renal veins and iliac veins demonstrate normal patency. No significant varices are identified by CT with probable very small varices present at the GE junction and level of distal esophagus. No enlarged lymph nodes are identified in the abdomen or pelvis. Reproductive: Prostate is unremarkable. Other: Small to moderate volume intraperitoneal ascites. Musculoskeletal: No acute or significant osseous findings. IMPRESSION: 1. Evidence of cirrhosis with shrunken and nodular liver. No evidence of hepatocellular carcinoma by CT. 2. Large right pleural effusion causing complete compressive atelectasis  of the right middle lobe and right lower lobe. Fluid appears similar in density to ascites in the peritoneal cavity. 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 Volume of ascites is small to moderate in the peritoneal cavity. 3. Probable very small varices at the GE junction and level of the distal esophagus. 4. The portal vein is open without evidence of thrombus and there is no contraindication anatomically to proceeding with a TIPS procedure. Electronically Signed   By: GAletta EdouardM.D.   On: 07/26/2019 08:39   IR Tips  Result Date: 07/26/2019 CLINICAL DATA:  Cirrhosis, ascites and right-sided hepatic hydrothorax. The patient presents for a TIPS procedure. EXAM: 1. ULTRASOUND GUIDANCE FOR VASCULAR ACCESS OF THE PORTAL VEIN 2. TRANSJUGULAR INTRAHEPATIC PORTOSYSTEMIC SHUNT PLACEMENT MEDICATIONS: As antibiotic prophylaxis, 2 g IV Ancef was ordered pre-procedure and administered intravenously within one hour of incision. One unit of platelets was also administered IV during the procedure. ANESTHESIA/SEDATION: General anesthesia was administered by the Anesthesia Department. CONTRAST:  100 mL Omnipaque 300 FLUOROSCOPY TIME:  Fluoroscopy Time: 75 minutes.  1,891 mGy. COMPLICATIONS: None immediate. PROCEDURE: Informed written consent was obtained from the patient  after a thorough discussion of the procedural risks, benefits and alternatives. All questions were addressed. Maximal Sterile Barrier Technique was utilized including caps, mask, sterile gowns, sterile gloves, sterile drape, hand hygiene and skin antiseptic. A timeout was performed prior to the initiation of the procedure. Transhepatic portal vein access was performed under ultrasound guidance. Patency of the portal veins in the right lobe of the liver was confirmed by ultrasound. Under direct ultrasound guidance, a 21 gauge needle from an Grannis set was  advanced into a right portal vein branch. Ultrasound image documentation was performed. Contrast injection was performed through the needle after return of blood. A guidewire was advanced through the needle. The Accustick coaxial dilator was then advanced over the wire. A hemostatic adapter was applied to the outer 6 [place] dilator which was then utilized during the procedure for portal venography and as a target for portal vein access. Ultrasound was used to confirm patency of the right internal jugular vein. Under direct ultrasound guidance, access of the vein was performed with a micropuncture needle and micropuncture set. Ultrasound image documentation was performed. After access of the vein, venous access was dilated and a 10 French angled sheath advanced into the inferior vena cava under fluoroscopy over a guidewire. Portal venography was performed through the transhepatic portal venous access. Hepatic venography was performed through 5 French catheters advanced into right and middle hepatic veins. Several attempts were made to gain access into the portal venous system via both right and middle hepatic veins with a Colapinto needle. Ultimately, a "gun-sight" technique was utilized by advancing a 10 mm snare out of the percutaneous transhepatic portal vein catheter into the right portal vein and additional snare via the 10 Fr TIPS sheath into the right hepatic vein. A 21 gauge, 15 cm Chiba needle was then advanced through the abdominal wall near the midline and obliquely through both snares under fluoroscopy. This allowed advancement of a 0.018 inch guidewire through the [place] needle which was then snared by the hepatic vein snare and pulled back out of the right jugular venous sheath. A coaxial catheter system consisting of an outer 0.035 inch diameter catheter and inter 0.014 inch diameter catheter was then advanced over the guidewire through the right jugular TIPS sheath. The inner catheter was removed. The  outer catheter was then slowly retracted and contrast injected under fluoroscopy. Angiography was also performed through this catheter. A parallel guidewire was then advanced through the catheter and into the portal vein to establish portal vein access for TIPS placement. A 5 French marking pigtail catheter was advanced over a stiff guidewire into the portal vein and venography performed to assess appropriate length of covered stent for TIPS creation. Tract between the hepatic vein and right portal vein was dilated with a 6 mm x 4 cm Conquest balloon. The 10 French sheath was then advanced into the main portal vein. A 10 mm diameter Viatorr covered stent prosthesis with 6 cm covered stent length and 2 cm uncovered length was advanced through the sheath. The uncovered segment was positioned at the level of portal venous access. The prosthesis was then fully deployed. The covered stent was then post dilated with an 8 mm x 4 cm length balloon. Additional venography was then performed via the pigtail catheter readvanced into the portal vein. The pigtail catheter was removed. A slurry of Gel-Foam pledgets was made mixed with sterile saline. This was injected after retraction of the percutaneous portal access catheter out of the portal venous system. The  jugular sheath was removed and hemostasis obtained with manual compression. FINDINGS: Both paracentesis and thoracentesis procedures were performed just prior to TIPS and are dictated separately. After obtaining jugular access, there was ability to catheterize both transversely oriented right and either additional right or middle hepatic veins. Access of the portal venous system could not be initially obtained with a standard intrahepatic Colapinto needle puncture technique due to far anterior location of the right portal vein relative to the hepatic veins. A "gun-site" maneuver with snares was successful in obtaining portal access and allowing TIPS placement. During this  maneuver, there was partial opacification hepatic arteries in the liver with injection the outer coaxial catheter. It did appear that this hepatic arterial puncture was most likely intrahepatic and not extrahepatic. After placement of the covered stent, there is excellent flow via the covered stent from the main portal vein into the attic vein and right atrium. No significant intrahepatic portal flow is identified. During the procedure, it was noted that despite near complete evacuation of right pleural fluid, there remained poor aeration of the right lung without evidence of pneumothorax. This is felt to be most likely a combination atelectasis and potentially post expansion pulmonary edema in the right lung. IMPRESSION: 1. Successful TIPS procedure with placement of a 10 mm Viatorr covered stent prosthesis (2 cm uncovered portal vein segment, 6 cm covered intrahepatic segment) extending from the main portal vein into the right hepatic vein with excellent flow present after stent placement. 2. Right thoracentesis and paracentesis were performed just prior to initiating the TIPS procedure to decrease the risk of intraperitoneal bleeding and also to try to improve ventilation under general anesthesia. As above, there appear to be poor aeration of the right lung despite near complete evacuation of right pleural fluid felt to be most likely a combination of atelectasis and potentially post expansion pulmonary edema. Patient will be admitted to the hospitalist service for observation. Outpatient follow-up with a TIPS ultrasound in 1 month. Electronically Signed   By: Aletta Edouard M.D.   On: 07/26/2019 15:09   DG CHEST PORT 1 VIEW  Result Date: 07/26/2019 CLINICAL DATA:  Acute respiratory failure, history cirrhosis, celiac disease, hypertension EXAM: PORTABLE CHEST 1 VIEW COMPARISON:  Portable exam 1451 hours compared to 07/15/2019 FINDINGS: Normal heart size mediastinal contours. Extensive BILATERAL airspace  infiltrates RIGHT greater than LEFT with air bronchograms in the mid to lower RIGHT lung. No pleural effusion or pneumothorax. Osseous structures unremarkable. Gaseous distention of stomach. IMPRESSION: Extensive airspace infiltrates in both lungs RIGHT much greater than LEFT, may represent multifocal pneumonia, asymmetric edema or ARDS. Electronically Signed   By: Lavonia Dana M.D.   On: 07/26/2019 16:20   IR Paracentesis  Result Date: 07/26/2019 INDICATION: History of alcoholic cirrhosis with refractory ascites; request made for therapeutic paracentesis EXAM: ULTRASOUND GUIDED PARACENTESIS MEDICATIONS: None COMPLICATIONS: None immediate. PROCEDURE: Informed written consent was obtained from the patient after a discussion of the risks, benefits and alternatives to treatment. A timeout was performed prior to the initiation of the procedure. Initial ultrasound scanning demonstrates a large amount of ascites within the right lower abdominal quadrant. The right lower abdomen was prepped and draped in the usual sterile fashion. Patient was intubated and sedated for TIPS procedure; no local anesthetic used. Following this, a 19 gauge, 7-cm, Yueh catheter was introduced. An ultrasound image was saved for documentation purposes. The paracentesis was performed. The catheter was removed and a dressing was applied. The patient tolerated the procedure well without immediate  post procedural complication. FINDINGS: A total of approximately 2.4L of clear yellow fluid was removed. IMPRESSION: Successful ultrasound-guided paracentesis yielding 2.4 liters of peritoneal fluid. This was performed at the time of a TIPS procedure. Read by: Soyla Dryer, NP Electronically Signed   By: Aletta Edouard M.D.   On: 07/26/2019 13:35   IR THORACENTESIS ASP PLEURAL SPACE W/IMG GUIDE  Result Date: 07/26/2019 INDICATION: Patient with a history of alcoholic cirrhosis; request for therapeutic thoracentesis for hepatic hydrothorax EXAM:  ULTRASOUND GUIDED RIGHT THORACENTESIS MEDICATIONS: None. COMPLICATIONS: None immediate. PROCEDURE: An ultrasound guided thoracentesis was thoroughly discussed with the patient and questions answered. The benefits, risks, alternatives and complications were also discussed. The patient understands and wishes to proceed with the procedure. Written consent was obtained. Ultrasound was performed to localize and mark an adequate pocket of fluid in the right chest. The area was then prepped and draped in the normal sterile fashion. Under ultrasound guidance a 6 Fr Safe-T-Centesis catheter was introduced. Thoracentesis was performed. The catheter was removed and a dressing applied. Patient was intubated and sedated for a TIPS procedure, no local anesthetic used. FINDINGS: A total of approximately 4.6 L of clear yellow fluid was removed. IMPRESSION: Successful ultrasound guided right thoracentesis yielding 4.6L of pleural fluid. The procedure immediately preceded a TIPS procedure. Read by: Soyla Dryer, NP Electronically Signed   By: Aletta Edouard M.D.   On: 07/26/2019 13:44    Labs:  CBC: Recent Labs    07/15/19 0832 07/15/19 0832 07/19/19 1411 07/26/19 1111 07/26/19 1638 07/27/19 0354  WBC 5.4  --  3.9*  --  10.5 8.9  HGB 12.3*   < > 12.9* 12.9* 13.2 10.0*  HCT 35.3*   < > 37.4* 38.0* 38.8* 29.3*  PLT 175  --  178  --  153 115*   < > = values in this interval not displayed.    COAGS: Recent Labs    06/30/19 1420 07/01/19 0818 07/03/19 0620 07/04/19 0235 07/19/19 1411 07/26/19 0632  INR 1.2   < > 1.3* 1.3* 1.1 1.1  APTT 34  --   --   --   --   --    < > = values in this interval not displayed.    BMP: Recent Labs    07/15/19 0832 07/15/19 0832 07/19/19 1411 07/26/19 0632 07/26/19 1111 07/27/19 0354  NA 126*   < > 131* 132* 134* 137  K 4.9   < > 4.6 4.0 3.8 4.3  CL 96*  --  99 100  --  106  CO2 22  --  25 22  --  19*  GLUCOSE 110*  --  113* 99  --  118*  BUN 14  --  10 14   --  15  CALCIUM 8.3*  --  8.5* 8.5*  --  8.5*  CREATININE 0.97  --  1.14 1.06  --  1.05  GFRNONAA >60  --  >60 >60  --  >60  GFRAA >60  --  >60 >60  --  >60   < > = values in this interval not displayed.    LIVER FUNCTION TESTS: Recent Labs    07/03/19 0620 07/04/19 0235 07/19/19 1411 07/27/19 0354  BILITOT 1.4* 1.3* 1.0 1.8*  AST 59* 50* 75* 66*  ALT 32 29 42 31  ALKPHOS 81 73 117 57  PROT 5.4* 4.9* 6.2* 4.6*  ALBUMIN 2.4* 2.3* 2.6* 2.8*    Assessment and Plan:  Ascites and hepatic hydrothorax  secondary to alcoholic cirrhosis.  S/p para, thora, and TIPS by Dr.Yamagata yesterday.  Doing well today.   Recommend lactulose to avoid encephalopathy.   F/U with Dr. Havery Moros Roosevelt Locks for management of diuretics.  IR outpatient clinic will call patient with appointment instructions   Electronically Signed: Murrell Redden, PA-C 07/27/2019, 11:01 AM    I spent a total of 15 Minutes at the the patient's bedside AND on the patient's hospital floor or unit, greater than 50% of which was counseling/coordinating care for f/u TIPS.

## 2019-07-27 NOTE — Progress Notes (Signed)
Attending note: I have seen and examined the patient. History, labs and imaging reviewed.  Interval History:  4/28: doing well this am on RA at time of my exam. No encephalopathy post TIPS.   Blood pressure 100/82, pulse 95, temperature 98.1 F (36.7 C), temperature source Oral, resp. rate (!) 21, height 6' (1.829 m), weight 82.3 kg, SpO2 100 %. Gen:      No acute distress, reclining comfortably in bed HEENT:  NCAT, EOMI, sclera anicteric Neck:     No masses; no thyromegaly Lungs:    Clear to auscultation on L, diminished on R; normal respiratory effort CV:         Regular rate and rhythm; no murmurs Abd:      + bowel sounds; protuberant, non-tender; no palpable masses, + distension Ext:    + edema; adequate peripheral perfusion Skin:      Warm and dry; no rash Neuro: alert and oriented x 3 Psych: normal mood and affect   Labs reviewed, significant for hgb 10, [id number]  Imaging: cxr with still R opacification , personally reviewed by me.  Assessment/plan: Negative pressure pulmonary edema:  -remains with R opacification -thankfully pt avoided intubation and prolonged hypoxia at this time.  -on RA and sating well.  -cont pulm hygiene with IS  Acute on Chronic normocytic anemia:  -transfuse <7 -no acute indication at this time and no overt bleeding noted -repeat later today   Alcoholic cirrhosis s/p tips:  -per GI and IR -no encephalopathy noted this am.  Stable for transfer from ICU at this time. CCM will sign off and have asked teaching service to reassume care as of 4/29.     care time 35 mins. This represents my time independent of the NP's/PA's/med student/residents time taking care of the pt. This is excluding procedures   [facility] 07/27/2019, 11:22 AM      NAME:  Shawn Martin, MRN:  [id number], DOB:  [birth year], LOS: 1 ADMISSION DATE:  07/26/2019, CONSULTATION DATE:  07/26/19 REFERRING MD:  Dr. Kathlene Cote, CHIEF  COMPLAINT:  Acute respiratory failure s/p TIPS procedure  Brief History   [name] is a 44 yo male with h/o alcoholic cirrhosis (abstinent since Jan 2021), chronic hyponatremia, and recurrent ascites requiring 1-2 paracenteses per week presenting for acute respiratory failure after paracentesis, thoracentesis, and TIPS procedure. He usually receives weekly paracenteses, yielding 5-13L in March and April this year. He presented yesterday for paracentesis and thoracentesis given a large R pleural effusion. Thoracentesis yielded 4.6L, and paracentesis yielded 2.4L, both without complication. TIPS was complicated by hepatic arterial puncture near the R portal vein, however he remained hemodynamically stable. Upon extubation, he had severe respiratory distress and required 15L on NRB. Chest X-ray showed severe pulmonary edema, worse on the R; pt was placed on BiPAP and admitted to the ICU.   Past Medical History  alcoholic cirrhosis (abstinent since Jan 2021), chronic hyponatremia, and recurrent ascites requiring 1-2 paracenteses per week EGD 01/2019: Grade II non bleeding varices  Liver Bx 06/22/19: Stage 4 cirrhosis  Significant Hospital Events   Thoracentesis, paracentesis, TIPS: 07/26/19 Acute hypoxic respiratory failure: 07/26/19  Consults:  n/a  Procedures:  Thoracentesis, paracentesis, TIPS: 07/26/19  Significant Diagnostic Tests:  CXR 07/26/19 showed severe pulmonary edema, worse on R. Complete whiteout of R lung.   Micro Data:  n/a  Antimicrobials:  none  Interim history/subjective:  NAE. Since admission to the ICU, he has been progressing well. He was  taken off BiPAP 2/2 vomiting and adequate oxygenation; placed on 6LNC, satting 98%. Bps have continued to be soft at 90s/50s-60s despite held Lasix and spiro; no dizziness, syncope, or other sx. He was further weaned to room air this morning. He reports he feels back to baseline and requested a diet so that he may eat and drink. AROBF.  Good UOP of 850 mL; Foley removed. Hgb dropped to 10 from 13.2, WBC 8.9, Cr 1.05. AST, ALT, TBili all at baseline.   Objective   Blood pressure (!) 93/54, pulse 98, temperature 98.1 F (36.7 C), temperature source Oral, resp. rate 17, height 6' (1.829 m), weight 82.3 kg, SpO2 96 %.    FiO2 (%):  [100 %] 100 %   Intake/Output Summary (Last 24 hours) at 07/27/2019 0951 Last data filed at 07/27/2019 0600 Gross per 24 hour  Intake 2680.45 ml  Output 2025 ml  Net 655.45 ml   Filed Weights   07/26/19 0626 07/26/19 1555 07/27/19 0500  Weight: 93 kg 92 kg 82.3 kg    Examination: General: A&Ox3. Well appearing, in NAD. Answers questions appropriately. HENT: normocephalic, atraumatic. No palpable LAD. PERRLA. Pharynx is without erythema.  Lungs: Distant breath sounds bilaterally, worse at R base. Normal work of breathing. No wheezes or rales.  Cardiovascular: RRR no m/r/g. No chest wall tenderness.  Abdomen: Soft, NTND. No rebound/guarding. No HSM.  Extremities: 2+ pulses in all 4 extremities. No LE Edema or cyanosis.  Neuro: CN II-XII grossly intact. No asterixis.   Resolved Hospital Problem list   -  Assessment & Plan:  44 yo with h/o alcoholic cirrhosis here for acute hypoxic respiratory failure after thoracentesis, paracentesis, and TIPS procedure.   Acute hypoxemic respiratory failure: given pt had large 4.6L thoracentesis on the R, this is most likely re-expansion edema. He has done well since admission, weaning from BiPAP to [place] to now sats >95 on room air. Had good response to Lasix yesterday (>1.5L) however holding since given soft pressures of 90s/50s. At this point, his respiratory status is to baseline and stable on room air.  - [place] if needed  - Normal diet  - f/u CXR pending   Alcoholic Cirrhosis: Pt has long h/o alcoholic cirrhosis, stage 4 by biopsy in 05/2019. Also has 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 Given large volume thoracentesis and paracentesis, we will  continue to give albumin to expand intravascularly. Fluid has not yet been run however expect to be transudative, low suspicion for SBP. No signs of hepatic encephalopathy s/p TIPS procedure. At this point, he is overall doing well. We will continue to hold spiro and Lasix until his pressures increase, however would like to restart as soon as we can.  - f/u fluid analysis  - hold Lasix / spiro for BP <90/60. Will restart when possible  - continue albumin 25 g q6h for next 24 hours  - regular diet  S/p TIPS procedure: Pt had TIPS procedure on 2/54, complicated by hepatic arterial puncture near R portal vein. He was hemodynamically stable. Hgb has since dropped to 10.0 from 13.3. No signs of hepatic encephalopathy.  - f/u CBC today at 1200 - AROBF s/p TIPS  - cirrhosis mgmt as above   Best practice:  Diet: Normal diet Pain/Anxiety/Delirium protocol (if indicated): not indicated DVT prophylaxis: SCDs GI prophylaxis: not indicated Glucose control: not indicated  Mobility: falls risk Code Status: FULL Family Communication: Updated wife 4/27 evening  Disposition: Floor if pressures normalize  Labs  CBC: Recent Labs  Lab 07/26/19 1111 07/26/19 1638 07/27/19 0354  WBC  --  10.5 8.9  NEUTROABS  --  9.6*  --   HGB 12.9* 13.2 10.0*  HCT 38.0* 38.8* 29.3*  MCV  --  94.6 92.1  PLT  --  153 115*    Basic Metabolic Panel: Recent Labs  Lab 07/26/19 0632 07/26/19 1111 07/27/19 0354  NA 132* 134* 137  K 4.0 3.8 4.3  CL 100  --  106  CO2 22  --  19*  GLUCOSE 99  --  118*  BUN 14  --  15  CREATININE 1.06  --  1.05  CALCIUM 8.5*  --  8.5*  MG  --   --  1.7   GFR: Estimated Creatinine Clearance: 97.5 mL/min (by C-G formula based on SCr of 1.05 mg/dL). Recent Labs  Lab 07/26/19 1638 07/27/19 0354  WBC 10.5 8.9    Liver Function Tests: Recent Labs  Lab 07/27/19 0354  AST 66*  ALT 31  ALKPHOS 57  BILITOT 1.8*  PROT 4.6*  ALBUMIN 2.8*   No results for input(s): LIPASE,  AMYLASE in the last 168 hours. No results for input(s): AMMONIA in the last 168 hours.  ABG    Component Value Date/Time   PHART 7.309 (L) 07/26/2019 1111   PCO2ART 42.8 07/26/2019 1111   PO2ART 78 (L) 07/26/2019 1111   HCO3 21.5 07/26/2019 1111   TCO2 23 07/26/2019 1111   ACIDBASEDEF 5.0 (H) 07/26/2019 1111   O2SAT 94.0 07/26/2019 1111     Coagulation Profile: Recent Labs  Lab 07/26/19 0632  INR 1.1    Cardiac Enzymes: No results for input(s): CKTOTAL, CKMB, CKMBINDEX, TROPONINI in the last 168 hours.  HbA1C: No results found for: HGBA1C  CBG: Recent Labs  Lab 07/26/19 1540  GLUCAP 131*    Review of Systems:   Constitutional: Denies changes in weight.  Psych: Denies changes in mood or sleep Neuro: Denies headaches, ataxia, or falls HEENT: Denies changes in vision, hearing, smell, or taste. Denies rhinorrhea, congestion. Denies difficulty swallowing.  Cardiovascular: Denies chest pain, palpitations, or irregular heart beat.  Respiratory: Denies shortness of breath, cough, wheezing Gastrointestinal: Vomiting per hpi, denies diarrhea, constipation, blood in stool, or change in stool quality Genitourinary: endorses soreness of penis and slight dysuria 2/2 Foley, now improving. Denies hematuria  Musculoskeletal: Denies joint pain, swelling, or stiffness.  Skin: Denies new rashes or lesions  Endocrine: Denies heat/cold intolerance, changes in appetite   Past Medical History  He,  has a past medical history of Celiac disease, Hepatic cirrhosis ([place]), and Hypertension.   Surgical History    Past Surgical History:  Procedure Laterality Date  . IR PARACENTESIS  02/07/2019  . IR PARACENTESIS  05/25/2019  . IR PARACENTESIS  06/10/2019  . IR PARACENTESIS  06/15/2019  . IR PARACENTESIS  06/22/2019  . IR PARACENTESIS  07/01/2019  . IR PARACENTESIS  07/12/2019  . IR PARACENTESIS  07/19/2019  . IR PARACENTESIS  07/26/2019  . IR RADIOLOGIST EVAL & MGMT  07/07/2019  . IR  THORACENTESIS ASP PLEURAL SPACE W/IMG GUIDE  07/26/2019  . IR TIPS  07/26/2019  . IR TRANSCATHETER BX  06/22/2019  . IR US GUIDE VASC ACCESS RIGHT  06/22/2019  . IR VENOGRAM HEPATIC WO HEMODYNAMIC EVALUATION  06/22/2019  . NOSE SURGERY       Social History   reports that he has never smoked. He quit smokeless tobacco use about 2 years ago.  His smokeless tobacco use included chew. He reports previous alcohol use. He reports that he does not use drugs.   Family History   His family history includes Healthy in his mother; Heart attack in his father; Hypertension in his father; Leukemia in his brother. There is no history of Colon cancer, Stomach cancer, or Pancreatic cancer.   Allergies Allergies  Allergen Reactions  . Gluten Meal Diarrhea    bloating     Home Medications  Prior to Admission medications   Medication Sig Start Date End Date Taking? Authorizing Provider  cetirizine (ZYRTEC) 10 MG tablet Take 10 mg by mouth daily.   Yes [provider]  ferrous sulfate 325 (65 FE) MG tablet Take 325 mg by mouth daily.   Yes [provider]  furosemide (LASIX) 40 MG tablet Take 1 tablet (40 mg total) by mouth 2 (two) times daily. 07/04/19  Yes Thurnell Lose, MD  magnesium gluconate (MAGONATE) 500 MG tablet Take 500 mg by mouth daily as needed (cramps).    [provider]  Multiple Vitamin (MULTIVITAMIN WITH MINERALS) TABS tablet Take 1 tablet by mouth daily.    [provider]  spironolactone (ALDACTONE) 100 MG tablet Take 1 tablet (100 mg total) by mouth daily. 07/08/19   Armbruster, [name], MD     Critical care time: [address] mins     Ivin Poot, MS4

## 2019-07-28 DIAGNOSIS — D649 Anemia, unspecified: Secondary | ICD-10-CM

## 2019-07-28 DIAGNOSIS — J948 Other specified pleural conditions: Secondary | ICD-10-CM

## 2019-07-28 DIAGNOSIS — D696 Thrombocytopenia, unspecified: Secondary | ICD-10-CM

## 2019-07-28 MED ORDER — SPIRONOLACTONE 25 MG PO TABS
100.0000 mg | ORAL_TABLET | Freq: Every day | ORAL | Status: DC
  Administered 2019-07-29: 09:00:00 100 mg via ORAL
  Filled 2019-07-28: qty 4

## 2019-07-28 MED ORDER — LACTULOSE 10 GM/15ML PO SOLN: 10.0000 g | Freq: Every day | ORAL | Status: DC | PRN

## 2019-07-28 MED ORDER — FUROSEMIDE 40 MG PO TABS
40.0000 mg | ORAL_TABLET | Freq: Every day | ORAL | Status: DC
  Administered 2019-07-29: 40 mg via ORAL
  Filled 2019-07-28: qty 1

## 2019-07-28 NOTE — Progress Notes (Signed)
CPAP refused

## 2019-07-28 NOTE — TOC Initial Note (Signed)
Transition of Care [facility]) - Initial/Assessment Note    Patient Details  Name: Shawn Martin MRN: [id number] Date of Birth: Feb 06, 1976  Transition of [facility]) CM/SW Contact:    Pollie Friar, RN Phone Number: 07/28/2019, 2:33 PM  Clinical Narrative:                 Pt without insurance as he hasn't been at his job long enough to have insurance start.  Pt states he had been set up with [facility] prior but missed the appt being in the hospital. He is interested in continuing with [facility] and having a new appt.  Pt can then also use [facility] pharmacy to assist with the cost of his meds.  Pt denies issues with transportation.  Awaiting PT eval and ambulatory sat eval to see if he will need home oxygen.  TOC following.  Expected Discharge Plan: Home/Self Care Barriers to Discharge: Inadequate or no insurance, Continued Medical Work up   Patient Goals and CMS Choice        Expected Discharge Plan and Services Expected Discharge Plan: Home/Self Care   Discharge Planning Services: CM Consult   Living arrangements for the past 2 months: Single Family Home                                      Prior Living Arrangements/Services Living arrangements for the past 2 months: Single Family Home Lives with:: Spouse Patient language and need for interpreter reviewed:: Yes Do you feel safe going back to the place where you live?: Yes      Need for Family Participation in Patient Care: Yes (Comment) Care giver support system in place?: Yes (comment)(spouse)   Criminal Activity/Legal Involvement Pertinent to Current Situation/Hospitalization: No - Comment as needed  Activities of Daily Living Home Assistive Devices/Equipment: Scales, Blood pressure cuff, Hand-held shower hose ADL Screening (condition at time of admission) Patient's cognitive ability adequate to safely complete daily activities?: Yes Is the patient deaf or have difficulty hearing?: No Does the patient have difficulty  seeing, even when wearing glasses/contacts?: Yes(Contacts and glassess) Does the patient have difficulty concentrating, remembering, or making decisions?: No Patient able to express need for assistance with ADLs?: Yes Does the patient have difficulty dressing or bathing?: No Independently performs ADLs?: Yes (appropriate for developmental age) Does the patient have difficulty walking or climbing stairs?: No Weakness of Legs: None Weakness of Arms/Hands: None  Permission Sought/Granted                  Emotional Assessment Appearance:: Appears stated age Attitude/Demeanor/Rapport: Engaged Affect (typically observed): Accepting Orientation: : Oriented to Self, Oriented to Place, Oriented to  Time, Oriented to Situation   Psych Involvement: No (comment)  Admission diagnosis:  Acute respiratory failure with hypoxia ([place]) [J96.01] Patient Active Problem List   Diagnosis Date Noted  . Cirrhosis ([place]) 07/26/2019  . Acute respiratory failure with hypoxia ([place]) 07/26/2019  . Hyperkalemia 06/30/2019  . AKI (acute kidney injury) ([place]) 06/30/2019  . Hyponatremia 06/03/2019  . Alcohol dependence ([place]) 06/03/2019  . Symptomatic anemia 06/03/2019  . Ascites due to alcoholic cirrhosis ([place]) 40/98/1191  . Alcoholic cirrhosis of liver with ascites ([place]) 01/28/2019  . Screening for viral disease 01/28/2019   PCP:  Patient, No Pcp Per Pharmacy:   [facility], [place] [address] [place] [address] Phone: [phone]  Fax: [phone]  [facility] [place], [place] - [address] [place] [address] Phone: [phone] Fax: [phone]     Social Determinants of Health ([place]) Interventions    Readmission Risk Interventions Readmission Risk Prevention Plan 07/04/2019  Transportation Screening Complete  PCP or Specialist Appt within 3-5 Days Complete  HRI or [employer] (No  Data)  Medication Review (RN Transport planner) (No Data)

## 2019-07-28 NOTE — Progress Notes (Signed)
NT went to check routine vital signs and patients oxygen was low, 85 on room air; 2Liters of oxygen via [place] was applied to patient; patients oxygen returned to 95%.  Turned SpO2 sensor on telemetry box for a second set of eyes to monitor oxygen rate.

## 2019-07-28 NOTE — Progress Notes (Signed)
PROGRESS NOTE  Shawn Martin OIZ:[id number] DOB: January 08, 1976 DOA: 07/26/2019 PCP: Patient, No Pcp Per  Brief summary: [name] is a 44 yo M with Hx of Cirrhosis 2/2 Alcohol use (Quit Jan 2021), with chronic hyponatremia and recurrent ascites requiring 1-2 paracentesis per week, he underwent TIPS procedure with thoracentesis, paracentesis on April 27, he developed acute respiratory failure postprocedure requiring BiPAP and ICU admission  HPI/Recap of past 24 hours:  Feeling better, denies pain, no nausea no vomiting, no confusion, no fever However remained hypoxic requiring oxygen supplement, oxygen dropped to 85 on room air last night, still has intermittent cough Wife at bedside  Assessment/Plan: Active Problems:   Acute respiratory failure with hypoxia (HCC)  Acute hypoxic respiratory failure -Likely due to post expansion pulmonary edema, but does has right lung consolidation on chest x-ray, also has risk of recurrent right hydrothorax -Wean oxygen -Repeat chest x-ray   Alcoholic Cirrhosis with recurrent ascites/hydrothorax, 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 -Pt has long h/o alcoholic cirrhosis, stage 4 by biopsy in 05/2019. -Status post parenteral on April 27, home Lasix spironolactone has been on hold due to borderline blood pressure in the last few days -Blood pressure now has improved, will resume Lasix spironolactone  S/p TIPS procedure: Pt had TIPS procedure on 3/38, complicated by hepatic arterial puncture near R portal vein.   hemodynamically stable. Hgb 13-10-10. No signs of hepatic encephalopathy.  He report nightly-bowel movement 3 times a day, he is reluctant to start lactulose.  No asterixis  on exam  Normocytic anemia, creatinine Anemia of chronic disease due to cirrhosis And baseline fluctuate between 10-13  Thrombocytopenia -Likely due to cirrhosis -Platelet has remained above 100 -Monitor  Positive Asymptomatic Covid , tested positive  April to first, not tested again this admission   DVT Prophylaxis: SCDs  Code Status: Full  Family Communication: Wife at bedside  Disposition Plan:    Patient came from:          Home                                                                                                 Anticipated d/c place:  Home  Barriers to d/c OR conditions which need to be met to effect a safe d/c:  Wean oxygen, repeat chest x-ray  Consultants:  Critical care admission, transfer to hospitalist service on April 29  IR  Procedures: 4/27: Thoracentesis (-2.4L) 4/27: Paracentesis (-~5L) 4/27: TIPS procedure  Antibiotics:  Cefazolin4/27 (Periprocedural)   Objective: BP 107/74 (BP Location: Left Arm)   Pulse 94   Temp 97.9 F (36.6 C) (Oral)   Resp 20   Ht 6' (1.829 m)   Wt 82.5 kg   SpO2 96%   BMI 24.67 kg/m  No intake or output data in the 24 hours ending 07/28/19 1731 Filed Weights   07/26/19 1555 07/27/19 0500 07/28/19 0325  Weight: 92 kg 82.3 kg 82.5 kg    Exam: Patient is examined daily including today on 07/28/2019, exams remain the same as of yesterday except that has changed    General:  NAD  Cardiovascular: RRR  Respiratory: Diminished on the right base, no wheezing ,no rales, left lung clear  Abdomen: Soft/ND/NT, positive BS  Musculoskeletal: No Edema  Neuro: alert, oriented   Data Reviewed: Basic Metabolic Panel: Recent Labs  Lab 07/26/19 0632 07/26/19 1111 07/27/19 0354  NA 132* 134* 137  K 4.0 3.8 4.3  CL 100  --  106  CO2 22  --  19*  GLUCOSE 99  --  118*  BUN 14  --  15  CREATININE 1.06  --  1.05  CALCIUM 8.5*  --  8.5*  MG  --   --  1.7   Liver Function Tests: Recent Labs  Lab 07/27/19 0354  AST 66*  ALT 31  ALKPHOS 57  BILITOT 1.8*  PROT 4.6*  ALBUMIN 2.8*   No results for input(s): LIPASE, AMYLASE in the last 168 hours. No results for input(s): AMMONIA in the last 168 hours. CBC: Recent Labs  Lab 07/26/19 1111  07/26/19 1638 07/27/19 0354 07/27/19 1506  WBC  --  10.5 8.9 8.9  NEUTROABS  --  9.6*  --   --   HGB 12.9* 13.2 10.0* 10.0*  HCT 38.0* 38.8* 29.3* 29.3*  MCV  --  94.6 92.1 93.9  PLT  --  153 115* 105*   Cardiac Enzymes:   No results for input(s): CKTOTAL, CKMB, CKMBINDEX, TROPONINI in the last 168 hours. BNP (last 3 results) Recent Labs    07/02/19 0314 07/03/19 0620 07/04/19 0235  BNP 467.0* 183.8* 163.5*    ProBNP (last 3 results) No results for input(s): PROBNP in the last 8760 hours.  CBG: Recent Labs  Lab 07/26/19 1540  GLUCAP 131*    Recent Results (from the past 240 hour(s))  MRSA PCR Screening     Status: None   Collection Time: 07/26/19  4:27 PM   Specimen: Nasal Mucosa; Nasopharyngeal  Result Value Ref Range Status   MRSA by PCR NEGATIVE NEGATIVE Final    Comment:        The GeneXpert MRSA Assay (FDA approved for NASAL specimens only), is one component of a comprehensive MRSA colonization surveillance program. It is not intended to diagnose MRSA infection nor to guide or monitor treatment for MRSA infections. Performed at [facility], [place] [address] [place], [place] [address]      Studies: No results found.  Scheduled Meds: . Chlorhexidine Gluconate Cloth  6 each Topical Daily  . pantoprazole (PROTONIX) IV  40 mg Intravenous QHS    Continuous Infusions: . sodium chloride Stopped (07/27/19 0821)  . lactated ringers       Time spent: 35 mins I have personally reviewed and interpreted on  07/28/2019 daily labs, tele strips, imagings as discussed above under date review session and assessment and plans.  I reviewed all nursing notes, pharmacy notes, consultant notes,  vitals, pertinent old records  I have discussed plan of care as described above with RN , patient and family on 07/28/2019   Florencia Reasons MD, PhD, FACP  [facility]  Available via Epic secure chat 7am-7pm for nonurgent issues Please page for urgent issues,  pager number available through [email or web address] .   07/28/2019, 5:31 PM  LOS: 2 days

## 2019-07-28 NOTE — Progress Notes (Signed)
Referring Physician(s): Dr. 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 Kathlene Cote. He is feeling much better. He is eating and drinking well, ambulating to the bathroom, and is eager to get home. His wife is at the bedside this afternoon.   Allergies: Gluten meal  Medications: Prior to Admission medications   Medication Sig Start Date End Date Taking? Authorizing Provider  cetirizine (ZYRTEC) 10 MG tablet Take 10 mg by mouth daily.   Yes [provider]  ferrous sulfate 325 (65 FE) MG tablet Take 325 mg by mouth daily.   Yes [provider]  furosemide (LASIX) 40 MG tablet Take 1 tablet (40 mg total) by mouth 2 (two) times daily. 07/04/19  Yes Thurnell Lose, MD  magnesium gluconate (MAGONATE) 500 MG tablet Take 500 mg by mouth daily as needed (cramps).    [provider]  Multiple Vitamin (MULTIVITAMIN WITH MINERALS) TABS tablet Take 1 tablet by mouth daily.    [provider]  spironolactone (ALDACTONE) 100 MG tablet Take 1 tablet (100 mg total) by mouth daily. 07/08/19   Armbruster, [name], MD     Vital Signs: BP 114/74 (BP Location: Left Arm)   Pulse 94   Temp 97.9 F (36.6 C) (Oral)   Resp 20   Ht 6' (1.829 m)   Wt 181 lb 14.1 oz (82.5 kg)   SpO2 98%   BMI 24.67 kg/m   Physical Exam Constitutional:      General: He is not in acute distress.    Appearance: Normal appearance.  Cardiovascular:     Rate and Rhythm: Normal rate and regular rhythm.     Heart sounds: Normal heart sounds.  Pulmonary:     Effort: Pulmonary effort is normal.     Comments: Some dyspnea on exertion, patient is on 2L O2 via nasal cannula. Diffuse crackles auscultated throughout entire right lung field, some also in the left lower lobe. Left upper lobe with clear breath sounds.    Abdominal:     Palpations: Abdomen is soft.     Tenderness: There is abdominal tenderness.     Comments: Minimal tenderness at puncture sites to right abdomen.   Skin:    General: Skin is warm and dry.  Neurological:     Mental Status: He is alert and oriented to person, place, and time.  Psychiatric:        Mood and Affect: Mood normal.        Behavior: Behavior normal.        Thought Content: Thought content normal.        Judgment: Judgment normal.     Imaging: CT ABDOMEN PELVIS W WO CONTRAST  Result Date: 07/26/2019 CLINICAL DATA:  Cirrhosis, refractory ascites and recent right thoracentesis in Emergency Department on 07/15/2019 with removal 5 L of fluid. EXAM: CT ABDOMEN AND PELVIS WITHOUT AND WITH CONTRAST TECHNIQUE: Multidetector CT imaging of the abdomen and pelvis was performed following the standard protocol before and following the bolus administration of intravenous contrast. CONTRAST:  174m OMNIPAQUE IOHEXOL 350 MG/ML SOLN COMPARISON:  No prior CT studies. FINDINGS: Lower chest: Large right pleural effusion causes complete compressive atelectasis of the right middle lobe and right lower lobe. Fluid appears simple in density and measures the same density as ascites in the peritoneal cavity. Findings are likely reflective of hepatic  hydrothorax with transit of abdominal ascites into the right hemithorax through the diaphragm. Hepatobiliary: Evidence of cirrhosis with shrunken and nodular liver. No masses or abnormal enhancement on arterial or venous phases of imaging. Pancreas: Unremarkable. No pancreatic ductal dilatation or surrounding inflammatory changes. Spleen: Normal in size without focal abnormality. Adrenals/Urinary Tract: Adrenal glands are unremarkable. Kidneys are normal, without renal calculi, focal lesion, or hydronephrosis. Bladder is unremarkable. Stomach/Bowel: Bowel shows no evidence of obstruction, ileus or inflammation. No free air identified. No focal lesions  identified by CT. Vascular/Lymphatic: Arterial structures showed no significant abnormalities. There is minimal calcified plaque in the abdominal aorta without aneurysm. Venous evaluation demonstrates normal patency of the portal vein without evidence of thrombus. Mesenteric veins and the splenic vein are patent. IVC, hepatic veins, renal veins and iliac veins demonstrate normal patency. No significant varices are identified by CT with probable very small varices present at the GE junction and level of distal esophagus. No enlarged lymph nodes are identified in the abdomen or pelvis. Reproductive: Prostate is unremarkable. Other: Small to moderate volume intraperitoneal ascites. Musculoskeletal: No acute or significant osseous findings. IMPRESSION: 1. Evidence of cirrhosis with shrunken and nodular liver. No evidence of hepatocellular carcinoma by CT. 2. Large right pleural effusion causing complete compressive atelectasis of the right middle lobe and right lower lobe. Fluid appears similar in density to ascites in the peritoneal cavity. 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 Volume of ascites is small to moderate in the peritoneal cavity. 3. Probable very small varices at the GE junction and level of the distal esophagus. 4. The portal vein is open without evidence of thrombus and there is no contraindication anatomically to proceeding with a TIPS procedure. Electronically Signed   By: Aletta Edouard M.D.   On: 07/26/2019 08:39   IR Tips  Result Date: 07/26/2019 CLINICAL DATA:  Cirrhosis, ascites and right-sided hepatic hydrothorax. The patient presents for a TIPS procedure. EXAM: 1. ULTRASOUND GUIDANCE FOR VASCULAR ACCESS OF THE PORTAL VEIN 2. TRANSJUGULAR INTRAHEPATIC PORTOSYSTEMIC SHUNT PLACEMENT MEDICATIONS: As antibiotic prophylaxis, 2 g IV Ancef was ordered pre-procedure and administered intravenously within  one hour of incision. One unit of platelets was also administered IV during the procedure. ANESTHESIA/SEDATION: General anesthesia was administered by the Anesthesia Department. CONTRAST:  100 mL Omnipaque 300 FLUOROSCOPY TIME:  Fluoroscopy Time: 75 minutes.  1,891 mGy. COMPLICATIONS: None immediate. PROCEDURE: Informed written consent was obtained from the patient after a thorough discussion of the procedural risks, benefits and alternatives. All questions were addressed. Maximal Sterile Barrier Technique was utilized including caps, mask, sterile gowns, sterile gloves, sterile drape, hand hygiene and skin antiseptic. A timeout was performed prior to the initiation of the procedure. Transhepatic portal vein access was performed under ultrasound guidance. Patency of the portal veins in the right lobe of the liver was confirmed by ultrasound. Under direct ultrasound guidance, a 21 gauge needle from an [place] set was advanced into a right portal vein branch. Ultrasound image documentation was performed. Contrast injection was performed through the needle after return of blood. A guidewire was advanced through the needle. The Accustick coaxial dilator was then advanced over the wire. A hemostatic adapter was applied to the outer 6 [place] dilator which was then utilized during the procedure for portal venography and as a target for portal vein access. Ultrasound was used to confirm patency of the right internal jugular vein. Under direct ultrasound guidance, access of the  vein was performed with a micropuncture needle and micropuncture set. Ultrasound image documentation was performed. After access of the vein, venous access was dilated and a 10 French angled sheath advanced into the inferior vena cava under fluoroscopy over a guidewire. Portal venography was performed through the transhepatic portal venous access. Hepatic venography was performed through 5 French catheters advanced into right and middle hepatic  veins. Several attempts were made to gain access into the portal venous system via both right and middle hepatic veins with a Colapinto needle. Ultimately, a "gun-sight" technique was utilized by advancing a 10 mm snare out of the percutaneous transhepatic portal vein catheter into the right portal vein and additional snare via the 10 Fr TIPS sheath into the right hepatic vein. A 21 gauge, 15 cm Chiba needle was then advanced through the abdominal wall near the midline and obliquely through both snares under fluoroscopy. This allowed advancement of a 0.018 inch guidewire through the Humphreys needle which was then snared by the hepatic vein snare and pulled back out of the right jugular venous sheath. A coaxial catheter system consisting of an outer 0.035 inch diameter catheter and inter 0.014 inch diameter catheter was then advanced over the guidewire through the right jugular TIPS sheath. The inner catheter was removed. The outer catheter was then slowly retracted and contrast injected under fluoroscopy. Angiography was also performed through this catheter. A parallel guidewire was then advanced through the catheter and into the portal vein to establish portal vein access for TIPS placement. A 5 French marking pigtail catheter was advanced over a stiff guidewire into the portal vein and venography performed to assess appropriate length of covered stent for TIPS creation. Tract between the hepatic vein and right portal vein was dilated with a 6 mm x 4 cm Conquest balloon. The 10 French sheath was then advanced into the main portal vein. A 10 mm diameter Viatorr covered stent prosthesis with 6 cm covered stent length and 2 cm uncovered length was advanced through the sheath. The uncovered segment was positioned at the level of portal venous access. The prosthesis was then fully deployed. The covered stent was then post dilated with an 8 mm x 4 cm length balloon. Additional venography was then performed via the pigtail  catheter readvanced into the portal vein. The pigtail catheter was removed. A slurry of Gel-Foam pledgets was made mixed with sterile saline. This was injected after retraction of the percutaneous portal access catheter out of the portal venous system. The jugular sheath was removed and hemostasis obtained with manual compression. FINDINGS: Both paracentesis and thoracentesis procedures were performed just prior to TIPS and are dictated separately. After obtaining jugular access, there was ability to catheterize both transversely oriented right and either additional right or middle hepatic veins. Access of the portal venous system could not be initially obtained with a standard intrahepatic Colapinto needle puncture technique due to far anterior location of the right portal vein relative to the hepatic veins. A "gun-site" maneuver with snares was successful in obtaining portal access and allowing TIPS placement. During this maneuver, there was partial opacification hepatic arteries in the liver with injection the outer coaxial catheter. It did appear that this hepatic arterial puncture was most likely intrahepatic and not extrahepatic. After placement of the covered stent, there is excellent flow via the covered stent from the main portal vein into the attic vein and right atrium. No significant intrahepatic portal flow is identified. During the procedure, it was noted that despite near complete  evacuation of right pleural fluid, there remained poor aeration of the right lung without evidence of pneumothorax. This is felt to be most likely a combination atelectasis and potentially post expansion pulmonary edema in the right lung. IMPRESSION: 1. Successful TIPS procedure with placement of a 10 mm Viatorr covered stent prosthesis (2 cm uncovered portal vein segment, 6 cm covered intrahepatic segment) extending from the main portal vein into the right hepatic vein with excellent flow present after stent placement. 2.  Right thoracentesis and paracentesis were performed just prior to initiating the TIPS procedure to decrease the risk of intraperitoneal bleeding and also to try to improve ventilation under general anesthesia. As above, there appear to be poor aeration of the right lung despite near complete evacuation of right pleural fluid felt to be most likely a combination of atelectasis and potentially post expansion pulmonary edema. Patient will be admitted to the hospitalist service for observation. Outpatient follow-up with a TIPS ultrasound in 1 month. Electronically Signed   By: Aletta Edouard M.D.   On: 07/26/2019 15:09   DG Chest Port 1 View  Result Date: 07/27/2019 CLINICAL DATA:  Shortness of breath. Pneumonia. EXAM: PORTABLE CHEST 1 VIEW COMPARISON:  07/26/2019 FINDINGS: Increased right lung consolidation volume loss is seen, with air bronchograms. Probable layering right pleural effusion which is increased in size. Left lung is clear. Heart size is within normal limits. IMPRESSION: 1. Increased right lung consolidation and volume lossa. 2. Probable increased size of layering right pleural effusion. Electronically Signed   By: Marlaine Hind M.D.   On: 07/27/2019 10:07   DG CHEST PORT 1 VIEW  Result Date: 07/26/2019 CLINICAL DATA:  Acute respiratory failure, history cirrhosis, celiac disease, hypertension EXAM: PORTABLE CHEST 1 VIEW COMPARISON:  Portable exam 1451 hours compared to 07/15/2019 FINDINGS: Normal heart size mediastinal contours. Extensive BILATERAL airspace infiltrates RIGHT greater than LEFT with air bronchograms in the mid to lower RIGHT lung. No pleural effusion or pneumothorax. Osseous structures unremarkable. Gaseous distention of stomach. IMPRESSION: Extensive airspace infiltrates in both lungs RIGHT much greater than LEFT, may represent multifocal pneumonia, asymmetric edema or ARDS. Electronically Signed   By: Lavonia Dana M.D.   On: 07/26/2019 16:20   IR Paracentesis  Result Date:  07/26/2019 INDICATION: History of alcoholic cirrhosis with refractory ascites; request made for therapeutic paracentesis EXAM: ULTRASOUND GUIDED PARACENTESIS MEDICATIONS: None COMPLICATIONS: None immediate. PROCEDURE: Informed written consent was obtained from the patient after a discussion of the risks, benefits and alternatives to treatment. A timeout was performed prior to the initiation of the procedure. Initial ultrasound scanning demonstrates a large amount of ascites within the right lower abdominal quadrant. The right lower abdomen was prepped and draped in the usual sterile fashion. Patient was intubated and sedated for TIPS procedure; no local anesthetic used. Following this, a 19 gauge, 7-cm, Yueh catheter was introduced. An ultrasound image was saved for documentation purposes. The paracentesis was performed. The catheter was removed and a dressing was applied. The patient tolerated the procedure well without immediate post procedural complication. FINDINGS: A total of approximately 2.4L of clear yellow fluid was removed. IMPRESSION: Successful ultrasound-guided paracentesis yielding 2.4 liters of peritoneal fluid. This was performed at the time of a TIPS procedure. Read by: Soyla Dryer, NP Electronically Signed   By: Aletta Edouard M.D.   On: 07/26/2019 13:35   IR THORACENTESIS ASP PLEURAL SPACE W/IMG GUIDE  Result Date: 07/26/2019 INDICATION: Patient with a history of alcoholic cirrhosis; request for therapeutic thoracentesis for hepatic hydrothorax  EXAM: ULTRASOUND GUIDED RIGHT THORACENTESIS MEDICATIONS: None. COMPLICATIONS: None immediate. PROCEDURE: An ultrasound guided thoracentesis was thoroughly discussed with the patient and questions answered. The benefits, risks, alternatives and complications were also discussed. The patient understands and wishes to proceed with the procedure. Written consent was obtained. Ultrasound was performed to localize and mark an adequate pocket of fluid in  the right chest. The area was then prepped and draped in the normal sterile fashion. Under ultrasound guidance a 6 Fr Safe-T-Centesis catheter was introduced. Thoracentesis was performed. The catheter was removed and a dressing applied. Patient was intubated and sedated for a TIPS procedure, no local anesthetic used. FINDINGS: A total of approximately 4.6 L of clear yellow fluid was removed. IMPRESSION: Successful ultrasound guided right thoracentesis yielding 4.6L of pleural fluid. The procedure immediately preceded a TIPS procedure. Read by: Soyla Dryer, NP Electronically Signed   By: Aletta Edouard M.D.   On: 07/26/2019 13:44    Labs:  CBC: Recent Labs    07/19/19 1411 07/19/19 1411 07/26/19 1111 07/26/19 1638 07/27/19 0354 07/27/19 1506  WBC 3.9*  --   --  10.5 8.9 8.9  HGB 12.9*   < > 12.9* 13.2 10.0* 10.0*  HCT 37.4*   < > 38.0* 38.8* 29.3* 29.3*  PLT 178  --   --  153 115* 105*   < > = values in this interval not displayed.    COAGS: Recent Labs    06/30/19 1420 07/01/19 0818 07/03/19 0620 07/04/19 0235 07/19/19 1411 07/26/19 0632  INR 1.2   < > 1.3* 1.3* 1.1 1.1  APTT 34  --   --   --   --   --    < > = values in this interval not displayed.    BMP: Recent Labs    07/15/19 0832 07/15/19 0832 07/19/19 1411 07/26/19 0632 07/26/19 1111 07/27/19 0354  NA 126*   < > 131* 132* 134* 137  K 4.9   < > 4.6 4.0 3.8 4.3  CL 96*  --  99 100  --  106  CO2 22  --  25 22  --  19*  GLUCOSE 110*  --  113* 99  --  118*  BUN 14  --  10 14  --  15  CALCIUM 8.3*  --  8.5* 8.5*  --  8.5*  CREATININE 0.97  --  1.14 1.06  --  1.05  GFRNONAA >60  --  >60 >60  --  >60  GFRAA >60  --  >60 >60  --  >60   < > = values in this interval not displayed.    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  S/p para, thora, and TIPS by [facility] 07/26/19.  Patient had some oxygen desaturation last night and required supplemental oxygen. Patient is still on 2L nasal cannula and per the bedside RN, patient may go home with oxygen. I gave the patient an incentive spirometer and encouraged its use regularly throughout the day to improve oxygenation.  Recommend lactulose to avoid encephalopathy.   F/U with Dr. Havery Moros [place] for management of diuretics.  IR outpatient  clinic will call patient with appointment instructions   Electronically Signed: Theresa Duty, NP 07/28/2019, 3:38 PM   I spent a total of 25 Minutes at the the patient's bedside AND on the patient's hospital floor or unit, greater than 50% of which was counseling/coordinating care for follow up TIPS.

## 2019-07-28 NOTE — Progress Notes (Signed)
Pt ambulated in hall ~150 ft, on RA. Oximeter in place. Throughout walk sats remained above 93%. One episode of tachycardia noted. Returned to room in no acute distress. Left on RA in bedroom.

## 2019-07-29 ENCOUNTER — Inpatient Hospital Stay (HOSPITAL_COMMUNITY): Payer: Self-pay

## 2019-07-29 LAB — CBC WITH DIFFERENTIAL/PLATELET
Abs Immature Granulocytes: 0.02 10*3/uL (ref 0.00–0.07)
Basophils Absolute: 0 10*3/uL (ref 0.0–0.1)
Basophils Relative: 1 %
Eosinophils Absolute: 0.3 10*3/uL (ref 0.0–0.5)
Eosinophils Relative: 4 %
HCT: 30.2 % — ABNORMAL LOW (ref 39.0–52.0)
Hemoglobin: 10.4 g/dL — ABNORMAL LOW (ref 13.0–17.0)
Immature Granulocytes: 0 %
Lymphocytes Relative: 30 %
Lymphs Abs: 1.8 10*3/uL (ref 0.7–4.0)
MCH: 32.2 pg (ref 26.0–34.0)
MCHC: 34.4 g/dL (ref 30.0–36.0)
MCV: 93.5 fL (ref 80.0–100.0)
Monocytes Absolute: 0.8 10*3/uL (ref 0.1–1.0)
Monocytes Relative: 14 %
Neutro Abs: 3 10*3/uL (ref 1.7–7.7)
Neutrophils Relative %: 51 %
Platelets: 102 10*3/uL — ABNORMAL LOW (ref 150–400)
RBC: 3.23 MIL/uL — ABNORMAL LOW (ref 4.22–5.81)
RDW: 18.7 % — ABNORMAL HIGH (ref 11.5–15.5)
WBC: 5.8 10*3/uL (ref 4.0–10.5)
nRBC: 0 % (ref 0.0–0.2)

## 2019-07-29 LAB — COMPREHENSIVE METABOLIC PANEL
ALT: 42 U/L (ref 0–44)
AST: 76 U/L — ABNORMAL HIGH (ref 15–41)
Albumin: 2.3 g/dL — ABNORMAL LOW (ref 3.5–5.0)
Alkaline Phosphatase: 64 U/L (ref 38–126)
Anion gap: 5 (ref 5–15)
BUN: 11 mg/dL (ref 6–20)
CO2: 23 mmol/L (ref 22–32)
Calcium: 7.8 mg/dL — ABNORMAL LOW (ref 8.9–10.3)
Chloride: 105 mmol/L (ref 98–111)
Creatinine, Ser: 0.67 mg/dL (ref 0.61–1.24)
GFR calc Af Amer: >60 mL/min (ref >60)
GFR calc non Af Amer: >60 mL/min (ref >60)
Glucose, Bld: 107 mg/dL — ABNORMAL HIGH (ref 70–99)
Potassium: 3.7 mmol/L (ref 3.5–5.1)
Sodium: 133 mmol/L — ABNORMAL LOW (ref 135–145)
Total Bilirubin: 1.6 mg/dL — ABNORMAL HIGH (ref 0.3–1.2)
Total Protein: 4.2 g/dL — ABNORMAL LOW (ref 6.5–8.1)

## 2019-07-29 LAB — AMMONIA: Ammonia: 47 umol/L — ABNORMAL HIGH (ref 9–35)

## 2019-07-29 LAB — MAGNESIUM: Magnesium: 1.5 mg/dL — ABNORMAL LOW (ref 1.7–2.4)

## 2019-07-29 MED ORDER — POTASSIUM CHLORIDE CRYS ER 20 MEQ PO TBCR
40.0000 meq | EXTENDED_RELEASE_TABLET | Freq: Once | ORAL | Status: AC
  Administered 2019-07-29: 12:00:00 40 meq via ORAL
  Filled 2019-07-29: qty 2

## 2019-07-29 MED ORDER — SUGAMMADEX SODIUM 200 MG/2ML IV SOLN
INTRAVENOUS | Status: DC | PRN
  Administered 2019-07-26: 300 mg via INTRAVENOUS

## 2019-07-29 MED ORDER — LACTULOSE 10 GM/15ML PO SOLN: ORAL | 0 refills | Status: DC

## 2019-07-29 MED ORDER — MAGNESIUM SULFATE 2 GM/50ML IV SOLN
2.0000 g | Freq: Once | INTRAVENOUS | Status: AC
  Administered 2019-07-29: 12:00:00 2 g via INTRAVENOUS
  Filled 2019-07-29: qty 50

## 2019-07-29 MED FILL — LACTULOSE 10 GM/15 ML SOLN: 10 | 5 days supply | Qty: 236 | Fill #0

## 2019-07-29 NOTE — Progress Notes (Signed)
Pt discharge home with spouse after completion of ordered Magnesium. Pt prescription delivered to pt at bedside by pharmacy. Pt IV removed and discharge education and instructions completed with pt and spouse at bedside by RN Court[place]a. Pt discharge home with spouse to transport him home. Pt transported off unit via wheelchair with belongings to the side by volunteer. Delia Heady RN

## 2019-07-29 NOTE — Discharge Summary (Signed)
Discharge Summary  Shawn Martin KGY:[id number] DOB: 25-Feb-1976  PCP: Azzie Glatter, FNP  Admit date: 07/26/2019 Discharge date: 07/29/2019  Time spent: 73mns,  Recommendations for Outpatient Follow-up:  1. F/u with PCP within a week  for hospital discharge follow up, repeat cbc/bmp at follow up. 2. 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 Follow-up with interventional radiology Shawn Martin Discharge Diagnoses:  Active Hospital Problems   Diagnosis Date Noted  . Acute respiratory failure with hypoxia ([facility] 07/26/2019    Resolved Hospital Problems  No resolved problems to display.    Discharge Condition: stable  Diet recommendation: Low-sodium diet  Filed Weights   07/27/19 0500 07/28/19 0325 07/29/19 0500  Weight: 82.3 kg 82.5 kg 88.8 kg    History of present illness:  Mr. 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 -Pt has long h/o alcoholic cirrhosis, stage 4 by biopsy in 05/2019. -Status post parenteral on April 27, home Lasix spironolactone has been on hold due to borderline blood pressure in the last few days -Blood pressure now has improved, Lasix /spironolactone resumed  S/p TIPS procedure:  Pt had TIPS procedure on 40/26 complicated by hepatic arterial puncture near R portal vein.   hemodynamically stable. Hgb 13-10-10. No signs of hepatic encephalopathy.  He reports bowel movement 3 times a day without taking lactulose.  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 And baseline fluctuate between 10-13  Thrombocytopenia -Likely due to cirrhosis -Platelet has remained above 100 -Monitor   Hypokalemia/hypomagnesemia Replaced  Mild Hyponatremia , likely due to cirrhosis Outpatient monitor  Positive Asymptomatic Covid , tested positive April to first, not tested again this admission   DVT Prophylaxis: SCDs  Code Status: Full  Family Communication: Wife at bedside on 4/29  Disposition Plan:    Patient came from: Home  Anticipated d/c place: Home   Consultants:  Critical care admission, transfer to hospitalist service on April 29  IR  Procedures: 4/27: Thoracentesis (-2.4L) 4/27: Paracentesis (-~5L) 4/27: TIPS procedure  Antibiotics:  Cefazolin4/27 (Periprocedural)  Discharge Exam: BP 108/69 (BP Location: Left Arm)   Pulse 95   Temp 98.3 F (36.8 C) (Oral)   Resp 20   Ht 6' (1.829 m)   Wt 88.8 kg   SpO2 95%   BMI 26.55 kg/m    General:  NAD  Cardiovascular: RRR  Respiratory: Diminished on the right base, no wheezing ,no rales, left lung clear  Abdomen: Soft/ND/NT, positive BS  Musculoskeletal: No Edema  Neuro: alert, oriented    Discharge Instructions You were cared for by a hospitalist during your hospital stay. If you have any questions about your discharge medications or the care you  received while you were in the hospital after you are discharged, you can call the unit and asked to speak with the  hospitalist on call if the hospitalist that took care of you is not available. Once you are discharged, your primary care physician will handle any further medical issues. Please note that NO REFILLS for any discharge medications will be authorized once you are discharged, as it is imperative that you return to your primary care physician (or establish a relationship with a primary care physician if you do not have one) for your aftercare needs so that they can reassess your need for medications and monitor your lab values.  Discharge Instructions    Diet - low sodium heart healthy   Complete by: As directed    Increase activity slowly   Complete by: As directed      Allergies as of 07/29/2019      Reactions   Gluten Meal Diarrhea   bloating      Medication List    TAKE these medications   cetirizine 10 MG tablet Commonly known as: ZYRTEC Take 10 mg by mouth daily.   ferrous sulfate 325 (65 FE) MG tablet Take 325 mg by mouth daily.   furosemide 40 MG tablet Commonly known as: LASIX Take 1 tablet (40 mg total) by mouth 2 (two) times daily.   lactulose 10 GM/15ML solution Commonly known as: CHRONULAC Take 10g daily to up to three times a day to keep BM at 3-4 times a day, please call your GI doctor if you start to feel confused.   magnesium gluconate 500 MG tablet Commonly known as: MAGONATE Take 500 mg by mouth daily as needed (cramps).   multivitamin with minerals Tabs tablet Take 1 tablet by mouth daily.   spironolactone 100 MG tablet Commonly known as: Aldactone Take 1 tablet (100 mg total) by mouth daily.      Allergies  Allergen Reactions  . Gluten Meal Diarrhea    bloating   Follow-up Information    Aletta Edouard, MD Follow up.   Specialties: Interventional Radiology, Radiology Why: Follow up with [facility]/Dr. Kathlene Martin in one month. A scheduler will call you to set up this appointment. Please call the office with any questions or concerns prior  to your appointment.  Contact information: [address] [place] [address] [phone]        follow up with pcp Follow up.   Why: for hospital discharge follow up           The results of significant diagnostics from this hospitalization (including imaging, microbiology, ancillary and laboratory) are listed below for reference.    Significant Diagnostic Studies: CT ABDOMEN PELVIS W WO CONTRAST  Result Date: 07/26/2019 CLINICAL DATA:  Cirrhosis, refractory ascites and recent right thoracentesis in Emergency Department on 07/15/2019 with removal 5 L of fluid. EXAM: CT ABDOMEN AND PELVIS WITHOUT AND WITH CONTRAST TECHNIQUE: Multidetector CT imaging of the abdomen and pelvis was performed following the standard protocol before and following the bolus administration of intravenous contrast. CONTRAST:  174m OMNIPAQUE IOHEXOL 350 MG/ML SOLN COMPARISON:  No prior CT studies. FINDINGS: Lower chest: Large right pleural effusion causes complete compressive atelectasis of the right middle lobe and right lower lobe. Fluid appears simple in density and measures the same density as ascites in the peritoneal cavity. 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 Hepatobiliary: Evidence of cirrhosis with shrunken  and nodular liver. No masses or abnormal enhancement on arterial or venous phases of imaging. Pancreas: Unremarkable. No pancreatic ductal dilatation or surrounding inflammatory changes. Spleen: Normal in size without focal abnormality. Adrenals/Urinary Tract: Adrenal glands are unremarkable. Kidneys are normal, without renal calculi, focal lesion, or hydronephrosis. Bladder is unremarkable. Stomach/Bowel: Bowel shows no evidence of obstruction, ileus or inflammation. No free air identified. No focal lesions identified by CT. Vascular/Lymphatic: Arterial structures showed no significant  abnormalities. There is minimal calcified plaque in the abdominal aorta without aneurysm. Venous evaluation demonstrates normal patency of the portal vein without evidence of thrombus. Mesenteric veins and the splenic vein are patent. IVC, hepatic veins, renal veins and iliac veins demonstrate normal patency. No significant varices are identified by CT with probable very small varices present at the GE junction and level of distal esophagus. No enlarged lymph nodes are identified in the abdomen or pelvis. Reproductive: Prostate is unremarkable. Other: Small to moderate volume intraperitoneal ascites. Musculoskeletal: No acute or significant osseous findings. IMPRESSION: 1. Evidence of cirrhosis with shrunken and nodular liver. No evidence of hepatocellular carcinoma by CT. 2. Large right pleural effusion causing complete compressive atelectasis of the right middle lobe and right lower lobe. Fluid appears similar in density to ascites in the peritoneal cavity. 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 Volume of ascites is small to moderate in the peritoneal cavity. 3. Probable very small varices at the GE junction and level of the distal esophagus. 4. The portal vein is open without evidence of thrombus and there is no contraindication anatomically to proceeding with a TIPS procedure. Electronically Signed   By: Aletta Edouard M.D.   On: 07/26/2019 08:39   DG Chest 2 View  Result Date: 07/29/2019 CLINICAL DATA:  History of cirrhosis, chronic hyponatremia and recurrent ascites. Status post tips procedure with thoracentesis and paracentesis on April 27. EXAM: CHEST - 2 VIEW COMPARISON:  Chest x-ray dated 07/27/2019. FINDINGS: Persistent large opacity at the RIGHT lung base, but improved aeration at the RIGHT lung apex. Persistent RIGHT-sided pleural effusion, small to moderate in size. LEFT lung is clear. Heart size and  mediastinal contours appear stable. IMPRESSION: 1. Persistent large airspace opacity at the RIGHT lung base, presumably combination of consolidation and pleural effusion, but improved aeration of the RIGHT upper lung. 2. Persistent RIGHT-sided pleural effusion, small to moderate in size. 3. LEFT lung is clear. Electronically Signed   By: Franki Cabot M.D.   On: 07/29/2019 10:06   DG Chest 2 View  Result Date: 07/15/2019 CLINICAL DATA:  Post right thoracentesis EXAM: CHEST - 2 VIEW COMPARISON:  11/14/2019 FINDINGS: Marked improvement in right pleural effusion. Small residual right effusion remains with right lower lobe atelectasis. No pneumothorax Left lung is clear. No effusion on the left. No heart failure or edema IMPRESSION: Marked improvement in right pleural effusion.  No pneumothorax. Electronically Signed   By: Franchot Gallo M.D.   On: 07/15/2019 13:14   DG Chest 2 View  Result Date: 07/15/2019 CLINICAL DATA:  Shortness of breath EXAM: CHEST - 2 VIEW COMPARISON:  06/30/2019 FINDINGS: Large right pleural effusion with near-complete opacification of the right hemithorax. Bandlike opacity in the left lung base. No left-sided pleural effusion. Cardiac silhouette is largely obscured. No pneumothorax. IMPRESSION: 1. Large right pleural effusion. 2. Bandlike opacity in the left lung base, atelectasis versus consolidation. Electronically Signed   By: Davina Poke D.O.   On:  07/15/2019 09:24   US Paracentesis  Result Date: 07/03/2019 INDICATION: Recurrent large volume ascites secondary to cirrhosis. Request for therapeutic paracentesis. EXAM: ULTRASOUND GUIDED PARACENTESIS MEDICATIONS: 1% lidocaine 10 mL COMPLICATIONS: None immediate. PROCEDURE: Informed written consent was obtained from the patient after a discussion of the risks, benefits and alternatives to treatment. A timeout was performed prior to the initiation of the procedure. Initial ultrasound scanning demonstrates a large amount of ascites  within the left lateral abdomen. The left lateral abdomen was prepped and draped in the usual sterile fashion. 1% lidocaine was used for local anesthesia. Following this, a 19 gauge, 7-cm, Yueh catheter was introduced. An ultrasound image was saved for documentation purposes. The paracentesis was performed. The catheter was removed and a dressing was applied. The patient tolerated the procedure well without immediate post procedural complication. Patient received post-procedure intravenous albumin; see nursing notes for details. FINDINGS: A total of approximately 8.4 L of clear yellow fluid was removed. IMPRESSION: Successful ultrasound-guided paracentesis yielding 8.4 liters of peritoneal fluid. Read by: Gareth Eagle, PA-C Electronically Signed   By: Jerilynn Mages.  Shick M.D.   On: 07/03/2019 12:29   IR Tips  Result Date: 07/26/2019 CLINICAL DATA:  Cirrhosis, ascites and right-sided hepatic hydrothorax. The patient presents for a TIPS procedure. EXAM: 1. ULTRASOUND GUIDANCE FOR VASCULAR ACCESS OF THE PORTAL VEIN 2. TRANSJUGULAR INTRAHEPATIC PORTOSYSTEMIC SHUNT PLACEMENT MEDICATIONS: As antibiotic prophylaxis, 2 g IV Ancef was ordered pre-procedure and administered intravenously within one hour of incision. One unit of platelets was also administered IV during the procedure. ANESTHESIA/SEDATION: General anesthesia was administered by the Anesthesia Department. CONTRAST:  100 mL Omnipaque 300 FLUOROSCOPY TIME:  Fluoroscopy Time: 75 minutes.  1,891 mGy. COMPLICATIONS: None immediate. PROCEDURE: Informed written consent was obtained from the patient after a thorough discussion of the procedural risks, benefits and alternatives. All questions were addressed. Maximal Sterile Barrier Technique was utilized including caps, mask, sterile gowns, sterile gloves, sterile drape, hand hygiene and skin antiseptic. A timeout was performed prior to the initiation of the procedure. Transhepatic portal vein access was performed under  ultrasound guidance. Patency of the portal veins in the right lobe of the liver was confirmed by ultrasound. Under direct ultrasound guidance, a 21 gauge needle from an [place] set was advanced into a right portal vein branch. Ultrasound image documentation was performed. Contrast injection was performed through the needle after return of blood. A guidewire was advanced through the needle. The Accustick coaxial dilator was then advanced over the wire. A hemostatic adapter was applied to the outer 6 [place] dilator which was then utilized during the procedure for portal venography and as a target for portal vein access. Ultrasound was used to confirm patency of the right internal jugular vein. Under direct ultrasound guidance, access of the vein was performed with a micropuncture needle and micropuncture set. Ultrasound image documentation was performed. After access of the vein, venous access was dilated and a 10 French angled sheath advanced into the inferior vena cava under fluoroscopy over a guidewire. Portal venography was performed through the transhepatic portal venous access. Hepatic venography was performed through 5 French catheters advanced into right and middle hepatic veins. Several attempts were made to gain access into the portal venous system via both right and middle hepatic veins with a [place] needle. Ultimately, a "gun-sight" technique was utilized by advancing a 10 mm snare out of the percutaneous transhepatic portal vein catheter into the right portal vein and additional snare via the 10 Fr TIPS sheath into the  right hepatic vein. A 21 gauge, 15 cm [place] needle was then advanced through the abdominal wall near the midline and obliquely through both snares under fluoroscopy. This allowed advancement of a 0.018 inch guidewire through the [place] needle which was then snared by the hepatic vein snare and pulled back out of the right jugular venous sheath. A coaxial catheter system consisting of an  outer 0.035 inch diameter catheter and inter 0.014 inch diameter catheter was then advanced over the guidewire through the right jugular TIPS sheath. The inner catheter was removed. The outer catheter was then slowly retracted and contrast injected under fluoroscopy. Angiography was also performed through this catheter. A parallel guidewire was then advanced through the catheter and into the portal vein to establish portal vein access for TIPS placement. A 5 French marking pigtail catheter was advanced over a stiff guidewire into the portal vein and venography performed to assess appropriate length of covered stent for TIPS creation. Tract between the hepatic vein and right portal vein was dilated with a 6 mm x 4 cm Conquest balloon. The 10 French sheath was then advanced into the main portal vein. A 10 mm diameter Viatorr covered stent prosthesis with 6 cm covered stent length and 2 cm uncovered length was advanced through the sheath. The uncovered segment was positioned at the level of portal venous access. The prosthesis was then fully deployed. The covered stent was then post dilated with an 8 mm x 4 cm length balloon. Additional venography was then performed via the pigtail catheter readvanced into the portal vein. The pigtail catheter was removed. A slurry of Gel-Foam pledgets was made mixed with sterile saline. This was injected after retraction of the percutaneous portal access catheter out of the portal venous system. The jugular sheath was removed and hemostasis obtained with manual compression. FINDINGS: Both paracentesis and thoracentesis procedures were performed just prior to TIPS and are dictated separately. After obtaining jugular access, there was ability to catheterize both transversely oriented right and either additional right or middle hepatic veins. Access of the portal venous system could not be initially obtained with a standard intrahepatic Colapinto needle puncture technique due to far  anterior location of the right portal vein relative to the hepatic veins. A "gun-site" maneuver with snares was successful in obtaining portal access and allowing TIPS placement. During this maneuver, there was partial opacification hepatic arteries in the liver with injection the outer coaxial catheter. It did appear that this hepatic arterial puncture was most likely intrahepatic and not extrahepatic. After placement of the covered stent, there is excellent flow via the covered stent from the main portal vein into the attic vein and right atrium. No significant intrahepatic portal flow is identified. During the procedure, it was noted that despite near complete evacuation of right pleural fluid, there remained poor aeration of the right lung without evidence of pneumothorax. This is felt to be most likely a combination atelectasis and potentially post expansion pulmonary edema in the right lung. IMPRESSION: 1. Successful TIPS procedure with placement of a 10 mm Viatorr covered stent prosthesis (2 cm uncovered portal vein segment, 6 cm covered intrahepatic segment) extending from the main portal vein into the right hepatic vein with excellent flow present after stent placement. 2. Right thoracentesis and paracentesis were performed just prior to initiating the TIPS procedure to decrease the risk of intraperitoneal bleeding and also to try to improve ventilation under general anesthesia. As above, there appear to be poor aeration of the right lung despite  near complete evacuation of right pleural fluid felt to be most likely a combination of atelectasis and potentially post expansion pulmonary edema. Patient will be admitted to the hospitalist service for observation. Outpatient follow-up with a TIPS ultrasound in 1 month. Electronically Signed   By: Aletta Edouard M.D.   On: 07/26/2019 15:09   DG Chest Port 1 View  Result Date: 07/27/2019 CLINICAL DATA:  Shortness of breath. Pneumonia. EXAM: PORTABLE CHEST 1  VIEW COMPARISON:  07/26/2019 FINDINGS: Increased right lung consolidation volume loss is seen, with air bronchograms. Probable layering right pleural effusion which is increased in size. Left lung is clear. Heart size is within normal limits. IMPRESSION: 1. Increased right lung consolidation and volume lossa. 2. Probable increased size of layering right pleural effusion. Electronically Signed   By: Marlaine Hind M.D.   On: 07/27/2019 10:07   DG CHEST PORT 1 VIEW  Result Date: 07/26/2019 CLINICAL DATA:  Acute respiratory failure, history cirrhosis, celiac disease, hypertension EXAM: PORTABLE CHEST 1 VIEW COMPARISON:  Portable exam 1451 hours compared to 07/15/2019 FINDINGS: Normal heart size mediastinal contours. Extensive BILATERAL airspace infiltrates RIGHT greater than LEFT with air bronchograms in the mid to lower RIGHT lung. No pleural effusion or pneumothorax. Osseous structures unremarkable. Gaseous distention of stomach. IMPRESSION: Extensive airspace infiltrates in both lungs RIGHT much greater than LEFT, may represent multifocal pneumonia, asymmetric edema or ARDS. Electronically Signed   By: Lavonia Dana M.D.   On: 07/26/2019 16:20   DG Chest Portable 1 View  Result Date: 06/30/2019 CLINICAL DATA:  Shortness of breath EXAM: PORTABLE CHEST 1 VIEW COMPARISON:  None. FINDINGS: Note shallow degree of inspiration. There is a rather minimal pleural effusion on each side. There is slight bibasilar atelectasis. The lungs elsewhere are clear. Heart size and pulmonary vascularity are normal. No adenopathy. No bone lesions. IMPRESSION: Rather minimal pleural effusions bilaterally with bibasilar atelectasis. Lungs elsewhere clear. Cardiac silhouette normal. Electronically Signed   By: Lowella Grip III M.D.   On: 06/30/2019 13:23   VAS [place] LOWER EXTREMITY VENOUS (DVT)  Result Date: 07/03/2019  Lower Venous DVTStudy Indications: Swelling.  Comparison Study: No prior exam. Performing Technologist: Baldwin Crown ARDMS, RVT  Examination Guidelines: A complete evaluation includes B-mode imaging, spectral Doppler, color Doppler, and power Doppler as needed of all accessible portions of each vessel. Bilateral testing is considered an integral part of a complete examination. Limited examinations for reoccurring indications may be performed as noted. The reflux portion of the exam is performed with the patient in reverse Trendelenburg.  +---------+---------------+---------+-----------+----------+--------------+ RIGHT    CompressibilityPhasicitySpontaneityPropertiesThrombus Aging +---------+---------------+---------+-----------+----------+--------------+ CFV      Full           Yes      Yes                                 +---------+---------------+---------+-----------+----------+--------------+ SFJ      Full                                                        +---------+---------------+---------+-----------+----------+--------------+ FV Prox  Full                                                        +---------+---------------+---------+-----------+----------+--------------+  FV Mid   Full                                                        +---------+---------------+---------+-----------+----------+--------------+ FV DistalFull                                                        +---------+---------------+---------+-----------+----------+--------------+ PFV      Full                                                        +---------+---------------+---------+-----------+----------+--------------+ POP      Full           Yes      Yes                                 +---------+---------------+---------+-----------+----------+--------------+ PTV      Full                                                        +---------+---------------+---------+-----------+----------+--------------+ PERO     Full                                                         +---------+---------------+---------+-----------+----------+--------------+   +---------+---------------+---------+-----------+----------+--------------+ LEFT     CompressibilityPhasicitySpontaneityPropertiesThrombus Aging +---------+---------------+---------+-----------+----------+--------------+ CFV      Full           Yes      Yes                                 +---------+---------------+---------+-----------+----------+--------------+ SFJ      Full                                                        +---------+---------------+---------+-----------+----------+--------------+ FV Prox  Full                                                        +---------+---------------+---------+-----------+----------+--------------+ FV Mid   Full                                                        +---------+---------------+---------+-----------+----------+--------------+  FV DistalFull                                                        +---------+---------------+---------+-----------+----------+--------------+ PFV      Full                                                        +---------+---------------+---------+-----------+----------+--------------+ POP      Full           Yes      Yes                                 +---------+---------------+---------+-----------+----------+--------------+ PTV      Full                                                        +---------+---------------+---------+-----------+----------+--------------+ PERO     Full                                                        +---------+---------------+---------+-----------+----------+--------------+     Summary: BILATERAL: - No evidence of deep vein thrombosis seen in the lower extremities, bilaterally.  RIGHT: - No cystic structure found in the popliteal fossa.  LEFT: - No cystic structure found in the popliteal fossa.  *See table(s) above for measurements and  observations. Electronically signed by Curt Jews MD on 07/03/2019 at 8:18:32 AM.    Final    ECHOCARDIOGRAM LIMITED  Result Date: 07/04/2019    ECHOCARDIOGRAM LIMITED REPORT   Patient Name:   Shawn Martin Date of Exam: 07/04/2019 Medical Rec #:  [id number]            Height:       72.0 in Accession #:    [id number]           Weight:       209.4 lb Date of Birth:  07/19/1975             BSA:          2.173 m Patient Age:    75 years             BP:           105/68 mmHg Patient Gender: M                    HR:           83 bpm. Exam Location:  Inpatient Procedure: Limited Echo, Limited Color Doppler and Cardiac Doppler Indications:    CHF-Acute Diastolic N23.55  History:        Patient has no prior history of Echocardiogram examinations.                 Hepatic cirrhosis; Risk Factors:Hypertension. COVID-19  Positive.  Sonographer:    Mikki Santee RDCS (AE) Referring Phys: [id number] Margaree Mackintosh Ashley  1. The LV is hyperdynamic with LVEF 70-75%. There is an intracavitary gradient of 12 mmHG. There is chordal SAM. There is no asymmetric LVH to suggest hypertrophic cardiomyopathy. These findings are related to the hyperdynamic LV. There is pseudodyskinesis of the inferior wall, likely related to increased intraabdominal pressure in this patient with liver disease (normal finding). Left ventricular ejection fraction, by estimation, is 70 to 75%. The left ventricle has hyperdynamic function.  The left ventricle has no regional wall motion abnormalities. Left ventricular diastolic parameters were normal.  2. Right ventricular systolic function is normal. The right ventricular size is mildly enlarged. There is normal pulmonary artery systolic pressure. The estimated right ventricular systolic pressure is 67.3 mmHg.  3. The mitral valve is grossly normal. Mild mitral valve regurgitation. No evidence of mitral stenosis.  4. The aortic valve is tricuspid. Aortic valve regurgitation is not visualized. No aortic  stenosis is present.  5. The inferior vena cava is normal in size with <50% respiratory variability, suggesting right atrial pressure of 8 mmHg. FINDINGS  Left Ventricle: The LV is hyperdynamic with LVEF 70-75%. There is an intracavitary gradient of 12 mmHG. There is chordal SAM. There is no asymmetric LVH to suggest hypertrophic cardiomyopathy. These findings are related to the hyperdynamic LV. There is pseudodyskinesis of the inferior wall, likely related to increased intraabdominal pressure in this patient with liver disease (normal finding). Left ventricular ejection fraction, by estimation, is 70 to 75%. The left ventricle has hyperdynamic function.  The left ventricle has no regional wall motion abnormalities. The left ventricular internal cavity size was normal in size. There is no left ventricular hypertrophy. Normal left ventricular filling pressure. Right Ventricle: The right ventricular size is mildly enlarged. No increase in right ventricular wall thickness. Right ventricular systolic function is normal. There is normal pulmonary artery systolic pressure. The tricuspid regurgitant velocity is 2.05  m/s, and with an assumed right atrial pressure of 8 mmHg, the estimated right ventricular systolic pressure is 41.9 mmHg. Left Atrium: Left atrial size was normal in size. Right Atrium: Right atrial size was normal in size. Pericardium: There is no evidence of pericardial effusion. Mitral Valve: The mitral valve is grossly normal. Mild mitral valve regurgitation. No evidence of mitral valve stenosis. Tricuspid Valve: The tricuspid valve is grossly normal. Tricuspid valve regurgitation is trivial. No evidence of tricuspid stenosis. Aortic Valve: The aortic valve is tricuspid. Aortic valve regurgitation is not visualized. No aortic stenosis is present. Pulmonic Valve: The pulmonic valve was grossly normal. Pulmonic valve regurgitation is not visualized. Venous: The inferior vena cava is normal in size with less  than 50% respiratory variability, suggesting right atrial pressure of 8 mmHg. Additional Comments: There is a small pleural effusion in the left lateral region.  LEFT VENTRICLE PLAX 2D LVIDd:         4.20 cm  Diastology LVIDs:         2.60 cm  LV e' lateral:   13.10 cm/s LV PW:         1.00 cm  LV E/e' lateral: 5.6 LV IVS:        1.00 cm  LV e' medial:    8.92 cm/s LVOT diam:     2.40 cm  LV E/e' medial:  8.3 LV SV:         161 LV SV Index:   74 LVOT Area:  4.52 cm  LEFT ATRIUM             Index       RIGHT ATRIUM           Index LA diam:        3.90 cm 1.79 cm/m  RA Area:     15.30 cm LA Vol (A2C):   36.0 ml 16.57 ml/m RA Volume:   32.30 ml  14.87 ml/m LA Vol (A4C):   44.1 ml 20.30 ml/m LA Biplane Vol: 43.9 ml 20.21 ml/m  AORTIC VALVE LVOT Vmax:   155.00 cm/s LVOT Vmean:  104.000 cm/s LVOT VTI:    0.356 m  AORTA Ao Root diam: 3.20 cm MITRAL VALVE               TRICUSPID VALVE MV Area (PHT): 2.20 cm    TR Peak grad:   16.8 mmHg MV Decel Time: 345 msec    TR Vmax:        205.00 cm/s MR Peak grad: 112.4 mmHg MR Vmax:      530.00 cm/s  SHUNTS MV E velocity: 73.70 cm/s  Systemic VTI:  0.36 m MV A velocity: 62.60 cm/s  Systemic Diam: 2.40 cm MV E/A ratio:  1.18 Eleonore Chiquito MD Electronically signed by Eleonore Chiquito MD Signature Date/Time: 07/04/2019/3:46:56 PM    Final    IR Radiologist Eval & Mgmt  Result Date: 07/07/2019 Please refer to notes tab for details about interventional procedure. (Op Note)  IR Paracentesis  Result Date: 07/26/2019 INDICATION: History of alcoholic cirrhosis with refractory ascites; request made for therapeutic paracentesis EXAM: ULTRASOUND GUIDED PARACENTESIS MEDICATIONS: None COMPLICATIONS: None immediate. PROCEDURE: Informed written consent was obtained from the patient after a discussion of the risks, benefits and alternatives to treatment. A timeout was performed prior to the initiation of the procedure. Initial ultrasound scanning demonstrates a large amount of ascites  within the right lower abdominal quadrant. The right lower abdomen was prepped and draped in the usual sterile fashion. Patient was intubated and sedated for TIPS procedure; no local anesthetic used. Following this, a 19 gauge, 7-cm, Yueh catheter was introduced. An ultrasound image was saved for documentation purposes. The paracentesis was performed. The catheter was removed and a dressing was applied. The patient tolerated the procedure well without immediate post procedural complication. FINDINGS: A total of approximately 2.4L of clear yellow fluid was removed. IMPRESSION: Successful ultrasound-guided paracentesis yielding 2.4 liters of peritoneal fluid. This was performed at the time of a TIPS procedure. Read by: Soyla Dryer, NP Electronically Signed   By: Aletta Edouard M.D.   On: 07/26/2019 13:35   IR Paracentesis  Result Date: 07/19/2019 INDICATION: Recurrent ascites.  Request for therapeutic paracentesis. EXAM: ULTRASOUND GUIDED RIGHT LOWER QUADRANT PARACENTESIS MEDICATIONS: None. COMPLICATIONS: None immediate. PROCEDURE: Informed written consent was obtained from the patient after a discussion of the risks, benefits and alternatives to treatment. A timeout was performed prior to the initiation of the procedure. Initial ultrasound scanning demonstrates a large amount of ascites within the right lower abdominal quadrant. The right lower abdomen was prepped and draped in the usual sterile fashion. 1% lidocaine was used for local anesthesia. Following this, a 19 gauge, 7-cm, Yueh catheter was introduced. An ultrasound image was saved for documentation purposes. The paracentesis was performed. The catheter was removed and a dressing was applied. The patient tolerated the procedure well without immediate post procedural complication. Patient received post-procedure intravenous albumin; see nursing notes for details. FINDINGS: A total of  approximately 5 L of clear yellow fluid was removed. IMPRESSION:  Successful ultrasound-guided paracentesis yielding 5 liters of peritoneal fluid. Read by: Ascencion Dike PA-C Electronically Signed   By: Lucrezia Europe M.D.   On: 07/19/2019 15:41   IR Paracentesis  Result Date: 07/12/2019 INDICATION: Patient history of alcoholic cirrhosis with recurrent ascites presents for therapeutic paracentesis. Maximum of 13 L. EXAM: ULTRASOUND GUIDED THERAPEUTIC PARACENTESIS MEDICATIONS: Lidocaine 1% 10 mL COMPLICATIONS: None immediate. PROCEDURE: Informed written consent was obtained from the patient after a discussion of the risks, benefits and alternatives to treatment. A timeout was performed prior to the initiation of the procedure. Initial ultrasound scanning demonstrates a large amount of ascites within the right lower abdominal quadrant. The right lower abdomen was prepped and draped in the usual sterile fashion. 1% lidocaine was used for local anesthesia. Following this, a 6 Fr Safe-T-Centesis catheter was introduced. An ultrasound image was saved for documentation purposes. The paracentesis was performed. The catheter was removed and a dressing was applied. The patient tolerated the procedure well without immediate post procedural complication. Patient received post-procedure intravenous albumin; see nursing notes for details. FINDINGS: A total of approximately 13 L of straw-colored fluid was removed. IMPRESSION: Successful ultrasound-guided therapeutic paracentesis yielding 13 liters of peritoneal fluid. Read by Rushie Nyhan NP Electronically Signed   By: Markus Daft M.D.   On: 07/12/2019 16:50   IR Paracentesis  Result Date: 07/01/2019 INDICATION: Patient with history of cirrhosis, recurrent ascites requiring frequent large volume paracentesis. Request to IR for diagnostic and therapeutic paracentesis with 10 L maximum. EXAM: ULTRASOUND GUIDED DIAGNOSTIC AND THERAPEUTIC PARACENTESIS MEDICATIONS: 10 mL 1% lidocaine COMPLICATIONS: None immediate. PROCEDURE: Informed written  consent was obtained from the patient after a discussion of the risks, benefits and alternatives to treatment. A timeout was performed prior to the initiation of the procedure. Initial ultrasound scanning demonstrates a large amount of ascites within the right lower abdominal quadrant. The right lower abdomen was prepped and draped in the usual sterile fashion. 1% lidocaine was used for local anesthesia. Following this, a 19 gauge, 7-cm, Yueh catheter was introduced. An ultrasound image was saved for documentation purposes. The paracentesis was performed. The catheter was removed and a dressing was applied. The patient tolerated the procedure well without immediate post procedural complication. Patient received post-procedure intravenous albumin; see nursing notes for details. FINDINGS: A total of approximately 10.0 L of hazy yellow fluid was removed. Samples were sent to the laboratory as requested by the clinical team. IMPRESSION: Successful ultrasound-guided paracentesis yielding 10.0 liters of peritoneal fluid. Read by Candiss Norse, PA-C Electronically Signed   By: Jerilynn Mages.  Shick M.D.   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 COMPLICATIONS: None immediate. PROCEDURE: An ultrasound guided thoracentesis was thoroughly discussed with the patient and questions answered. The benefits, risks, alternatives and complications were also discussed. The patient understands and wishes to proceed with the procedure. Written consent was obtained. Ultrasound was performed to localize and mark an adequate pocket of fluid in the right chest. The area was then prepped and draped in the normal sterile fashion. Under ultrasound guidance a 6 Fr Safe-T-Centesis catheter was introduced.  Thoracentesis was performed. The catheter was removed and a dressing applied. Patient was intubated and sedated for a TIPS procedure, no local anesthetic used. FINDINGS: A total of approximately  4.6 L of clear yellow fluid was removed. IMPRESSION: Successful ultrasound guided right thoracentesis yielding 4.6L of pleural fluid. The procedure immediately preceded a TIPS procedure. Read by: Soyla Dryer, NP Electronically Signed   By: Aletta Edouard M.D.   On: 07/26/2019 13:44    Microbiology: Recent Results (from the past 240 hour(s))  MRSA PCR Screening     Status: None   Collection Time: 07/26/19  4:27 PM   Specimen: Nasal Mucosa; Nasopharyngeal  Result Value Ref Range Status   MRSA by PCR NEGATIVE NEGATIVE Final    Comment:        The GeneXpert MRSA Assay (FDA approved for NASAL specimens only), is one component of a comprehensive MRSA colonization surveillance program. It is not intended to diagnose MRSA infection nor to guide or monitor treatment for MRSA infections. Performed at [facility], [place] [address], [place], [place] [address]      Labs: Basic Metabolic Panel: Recent Labs  Lab 07/26/19 6045 07/26/19 1111 07/27/19 0354 07/29/19 0359  NA 132* 134* 137 133*  K 4.0 3.8 4.3 3.7  CL 100  --  106 105  CO2 22  --  19* 23  GLUCOSE 99  --  118* 107*  BUN 14  --  15 11  CREATININE 1.06  --  1.05 0.67  CALCIUM 8.5*  --  8.5* 7.8*  MG  --   --  1.7 1.5*   Liver Function Tests: Recent Labs  Lab 07/27/19 0354 07/29/19 0359  AST 66* 76*  ALT 31 42  ALKPHOS 57 64  BILITOT 1.8* 1.6*  PROT 4.6* 4.2*  ALBUMIN 2.8* 2.3*   No results for input(s): LIPASE, AMYLASE in the last 168 hours. Recent Labs  Lab 07/29/19 0359  AMMONIA 47*   CBC: Recent Labs  Lab 07/26/19 1111 07/26/19 1638 07/27/19 0354 07/27/19 1506 07/29/19 0359  WBC  --  10.5 8.9 8.9 5.8  NEUTROABS  --  9.6*  --   --  3.0  HGB 12.9* 13.2 10.0* 10.0* 10.4*  HCT 38.0* 38.8* 29.3* 29.3*  30.2*  MCV  --  94.6 92.1 93.9 93.5  PLT  --  153 115* 105* 102*   Cardiac Enzymes: No results for input(s): CKTOTAL, CKMB, CKMBINDEX, TROPONINI in the last 168 hours. BNP: BNP (last 3 results) Recent Labs    07/02/19 0314 07/03/19 0620 07/04/19 0235  BNP 467.0* 183.8* 163.5*    ProBNP (last 3 results) No results for input(s): PROBNP in the last 8760 hours.  CBG: Recent Labs  Lab 07/26/19 1540  GLUCAP 131*       Signed:  Florencia Reasons MD, PhD, FACP  [facility] 07/29/2019, 11:13 AM

## 2019-07-29 NOTE — TOC Transition Note (Signed)
Transition of Care [facility]) - CM/SW Discharge Note   Patient Details  Name: Shawn Martin MRN: [id number] Date of Birth: 03/26/1976  [facility] CM/SW Contact:  Pollie Friar, RN Phone Number: 07/29/2019, 12:08 PM   Clinical Narrative:    Pt is discharging home with spouse. CM was able to get him an appt at [facility]. Information on the AVS. Information for [facility] pharmacy also added to the AVS for pt to get medication assistance.  Discharge medication to be delivered to the room per [place]. His cost is $5 and he can afford this.  CM has emailed [facility] to follow patient as he has no insurance.  Wife to provide transport home.    Final next level of care: Home/Self Care Barriers to Discharge: Inadequate or no insurance, Barriers Unresolved (comment)   Patient Goals and CMS Choice        Discharge Placement                       Discharge Plan and Services   Discharge Planning Services: CM Consult                                 Social Determinants of Health (SDOH) Interventions     Readmission Risk Interventions Readmission Risk Prevention Plan 07/04/2019  Transportation Screening Complete  PCP or Specialist Appt within 3-5 Days Complete  HRI or [place] (No Data)  Medication Review [employer]) (No Data)

## 2019-07-29 NOTE — Plan of Care (Signed)
Patient stable, discussed POC with patient, agreeable with plan, denies question/concerns at this time.   Problem: Education: Goal: Knowledge of General Education information will improve Description: Including pain rating scale, medication(s)/side effects and non-pharmacologic comfort measures Outcome: Adequate for Discharge   Problem: Health Behavior/Discharge Planning: Goal: Ability to manage health-related needs will improve Outcome: Adequate for Discharge   Problem: Clinical Measurements: Goal: Ability to maintain clinical measurements within normal limits will improve Outcome: Adequate for Discharge Goal: Will remain Reginal Wojcicki from infection Outcome: Adequate for Discharge Goal: Diagnostic test results will improve Outcome: Adequate for Discharge Goal: Respiratory complications will improve Outcome: Adequate for Discharge Goal: Cardiovascular complication will be avoided Outcome: Adequate for Discharge   Problem: Activity: Goal: Risk for activity intolerance will decrease Outcome: Adequate for Discharge   Problem: Nutrition: Goal: Adequate nutrition will be maintained Outcome: Adequate for Discharge   Problem: Coping: Goal: Level of anxiety will decrease Outcome: Adequate for Discharge   Problem: Elimination: Goal: Will not experience complications related to bowel motility Outcome: Adequate for Discharge Goal: Will not experience complications related to urinary retention Outcome: Adequate for Discharge   Problem: Pain Managment: Goal: General experience of comfort will improve Outcome: Adequate for Discharge   Problem: Safety: Goal: Ability to remain Lin Glazier from injury will improve Outcome: Adequate for Discharge   Problem: Skin Integrity: Goal: Risk for impaired skin integrity will decrease Outcome: Adequate for Discharge

## 2019-07-29 NOTE — Progress Notes (Signed)
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 Kathlene Cote. He is feeling much better. He is eating and drinking well, ambulating to the bathroom and in the hall, and is eager to get home. He is tolerating room air at rest and during ambulation.   Allergies: Gluten meal  Medications: Prior to Admission medications   Medication Sig Start Date End Date Taking? Authorizing Provider  cetirizine (ZYRTEC) 10 MG tablet Take 10 mg by mouth daily.   Yes [provider]  ferrous sulfate 325 (65 FE) MG tablet Take 325 mg by mouth daily.   Yes [provider]  furosemide (LASIX) 40 MG tablet Take 1 tablet (40 mg total) by mouth 2 (two) times daily. 07/04/19  Yes Thurnell Lose, MD  lactulose (CHRONULAC) 10 GM/15ML solution Take 10g daily to up to three times a day to keep BM at 3-4 times a day, please call your GI doctor if you start to feel confused. 07/29/19   Florencia Reasons, MD  magnesium gluconate (MAGONATE) 500 MG tablet Take 500 mg by mouth daily as needed (cramps).    [provider]  Multiple Vitamin (MULTIVITAMIN WITH MINERALS) TABS tablet Take 1 tablet by mouth daily.    [provider]  spironolactone (ALDACTONE) 100 MG tablet Take 1 tablet (100 mg total) by mouth daily. 07/08/19   [name], MD     Vital Signs: BP 108/69 (BP Location: Left Arm)   Pulse 95   Temp 98.3 F (36.8 C) (Oral)   Resp 20   Ht 6' (1.829 m)   Wt 195 lb 12.3 oz (88.8 kg)   SpO2 95%   BMI 26.55 kg/m   Physical Exam Constitutional:      Appearance: Normal appearance.  Cardiovascular:     Rate and Rhythm: Normal rate and regular rhythm.     Pulses: Normal pulses.     Heart sounds: Normal heart sounds.  Pulmonary:     Effort:  Pulmonary effort is normal.     Comments: Fine crackles throughout the right side; much improved over yesterday's assessment Abdominal:     General: Bowel sounds are normal.     Palpations: Abdomen is soft.     Tenderness: There is no abdominal tenderness.  Musculoskeletal:        General: Normal range of motion.  Skin:    General: Skin is warm and dry.  Neurological:     Mental Status: He is alert and oriented to person, place, and time.     Comments: Negative for asterixis.  Psychiatric:        Mood and Affect: Mood normal.        Behavior: Behavior normal.        Thought Content: Thought content normal.        Judgment: Judgment normal.     Imaging: CT ABDOMEN PELVIS W WO CONTRAST  Result Date: 07/26/2019 CLINICAL DATA:  Cirrhosis, refractory ascites and recent right thoracentesis in Emergency Department on 07/15/2019 with removal 5 L of fluid. EXAM: CT ABDOMEN AND PELVIS WITHOUT AND WITH CONTRAST TECHNIQUE: Multidetector CT imaging of the abdomen and pelvis was performed following the standard protocol before and following the bolus administration of intravenous contrast. CONTRAST:  128m OMNIPAQUE IOHEXOL 350 MG/ML SOLN COMPARISON:  No prior  CT studies. FINDINGS: Lower chest: Large right pleural effusion causes complete compressive atelectasis of the right middle lobe and right lower lobe. Fluid appears simple in density and measures the same density as ascites in the peritoneal cavity. 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 Hepatobiliary: Evidence of cirrhosis with shrunken and nodular liver. No masses or abnormal enhancement on arterial or venous phases of imaging. Pancreas: Unremarkable. No pancreatic ductal dilatation or surrounding inflammatory changes. Spleen: Normal in size without focal abnormality. Adrenals/Urinary Tract: Adrenal glands are unremarkable. Kidneys are normal, without  renal calculi, focal lesion, or hydronephrosis. Bladder is unremarkable. Stomach/Bowel: Bowel shows no evidence of obstruction, ileus or inflammation. No free air identified. No focal lesions identified by CT. Vascular/Lymphatic: Arterial structures showed no significant abnormalities. There is minimal calcified plaque in the abdominal aorta without aneurysm. Venous evaluation demonstrates normal patency of the portal vein without evidence of thrombus. Mesenteric veins and the splenic vein are patent. IVC, hepatic veins, renal veins and iliac veins demonstrate normal patency. No significant varices are identified by CT with probable very small varices present at the GE junction and level of distal esophagus. No enlarged lymph nodes are identified in the abdomen or pelvis. Reproductive: Prostate is unremarkable. Other: Small to moderate volume intraperitoneal ascites. Musculoskeletal: No acute or significant osseous findings. IMPRESSION: 1. Evidence of cirrhosis with shrunken and nodular liver. No evidence of hepatocellular carcinoma by CT. 2. Large right pleural effusion causing complete compressive atelectasis of the right middle lobe and right lower lobe. Fluid appears similar in density to ascites in the peritoneal cavity. 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 Volume of ascites is small to moderate in the peritoneal cavity. 3. Probable very small varices at the GE junction and level of the distal esophagus. 4. The portal vein is open without evidence of thrombus and there is no contraindication anatomically to proceeding with a TIPS procedure. Electronically Signed   By: Aletta Edouard M.D.   On: 07/26/2019 08:39   DG Chest 2 View  Result Date: 07/29/2019 CLINICAL DATA:  History of cirrhosis, chronic hyponatremia and recurrent ascites. Status post tips procedure with thoracentesis and paracentesis on April 27.  EXAM: CHEST - 2 VIEW COMPARISON:  Chest x-ray dated 07/27/2019. FINDINGS: Persistent large opacity at the RIGHT lung base, but improved aeration at the RIGHT lung apex. Persistent RIGHT-sided pleural effusion, small to moderate in size. LEFT lung is clear. Heart size and mediastinal contours appear stable. IMPRESSION: 1. Persistent large airspace opacity at the RIGHT lung base, presumably combination of consolidation and pleural effusion, but improved aeration of the RIGHT upper lung. 2. Persistent RIGHT-sided pleural effusion, small to moderate in size. 3. LEFT lung is clear. Electronically Signed   By: Franki Cabot M.D.   On: 07/29/2019 10:06   IR Tips  Result Date: 07/26/2019 CLINICAL DATA:  Cirrhosis, ascites and right-sided hepatic hydrothorax. The patient presents for a TIPS procedure. EXAM: 1. ULTRASOUND GUIDANCE FOR VASCULAR ACCESS OF THE PORTAL VEIN 2. TRANSJUGULAR INTRAHEPATIC PORTOSYSTEMIC SHUNT PLACEMENT MEDICATIONS: As antibiotic prophylaxis, 2 g IV Ancef was ordered pre-procedure and administered intravenously within one hour of incision. One unit of platelets was also administered IV during the procedure. ANESTHESIA/SEDATION: General anesthesia was administered by the Anesthesia Department. CONTRAST:  100 mL Omnipaque 300 FLUOROSCOPY TIME:  Fluoroscopy Time: 75 minutes.  1,891 mGy. COMPLICATIONS: None immediate. PROCEDURE: Informed  written consent was obtained from the patient after a thorough discussion of the procedural risks, benefits and alternatives. All questions were addressed. Maximal Sterile Barrier Technique was utilized including caps, mask, sterile gowns, sterile gloves, sterile drape, hand hygiene and skin antiseptic. A timeout was performed prior to the initiation of the procedure. Transhepatic portal vein access was performed under ultrasound guidance. Patency of the portal veins in the right lobe of the liver was confirmed by ultrasound. Under direct ultrasound guidance, a 21  gauge needle from an [place] set was advanced into a right portal vein branch. Ultrasound image documentation was performed. Contrast injection was performed through the needle after return of blood. A guidewire was advanced through the needle. The Accustick coaxial dilator was then advanced over the wire. A hemostatic adapter was applied to the outer 6 [place] dilator which was then utilized during the procedure for portal venography and as a target for portal vein access. Ultrasound was used to confirm patency of the right internal jugular vein. Under direct ultrasound guidance, access of the vein was performed with a micropuncture needle and micropuncture set. Ultrasound image documentation was performed. After access of the vein, venous access was dilated and a 10 French angled sheath advanced into the inferior vena cava under fluoroscopy over a guidewire. Portal venography was performed through the transhepatic portal venous access. Hepatic venography was performed through 5 French catheters advanced into right and middle hepatic veins. Several attempts were made to gain access into the portal venous system via both right and middle hepatic veins with a Colapinto needle. Ultimately, a "gun-sight" technique was utilized by advancing a 10 mm snare out of the percutaneous transhepatic portal vein catheter into the right portal vein and additional snare via the 10 Fr TIPS sheath into the right hepatic vein. A 21 gauge, 15 cm Chiba needle was then advanced through the abdominal wall near the midline and obliquely through both snares under fluoroscopy. This allowed advancement of a 0.018 inch guidewire through the [place] needle which was then snared by the hepatic vein snare and pulled back out of the right jugular venous sheath. A coaxial catheter system consisting of an outer 0.035 inch diameter catheter and inter 0.014 inch diameter catheter was then advanced over the guidewire through the right jugular TIPS  sheath. The inner catheter was removed. The outer catheter was then slowly retracted and contrast injected under fluoroscopy. Angiography was also performed through this catheter. A parallel guidewire was then advanced through the catheter and into the portal vein to establish portal vein access for TIPS placement. A 5 French marking pigtail catheter was advanced over a stiff guidewire into the portal vein and venography performed to assess appropriate length of covered stent for TIPS creation. Tract between the hepatic vein and right portal vein was dilated with a 6 mm x 4 cm Conquest balloon. The 10 French sheath was then advanced into the main portal vein. A 10 mm diameter Viatorr covered stent prosthesis with 6 cm covered stent length and 2 cm uncovered length was advanced through the sheath. The uncovered segment was positioned at the level of portal venous access. The prosthesis was then fully deployed. The covered stent was then post dilated with an 8 mm x 4 cm length balloon. Additional venography was then performed via the pigtail catheter readvanced into the portal vein. The pigtail catheter was removed. A slurry of Gel-Foam pledgets was made mixed with sterile saline. This was injected after retraction of the percutaneous portal access catheter  out of the portal venous system. The jugular sheath was removed and hemostasis obtained with manual compression. FINDINGS: Both paracentesis and thoracentesis procedures were performed just prior to TIPS and are dictated separately. After obtaining jugular access, there was ability to catheterize both transversely oriented right and either additional right or middle hepatic veins. Access of the portal venous system could not be initially obtained with a standard intrahepatic Colapinto needle puncture technique due to far anterior location of the right portal vein relative to the hepatic veins. A "gun-site" maneuver with snares was successful in obtaining portal  access and allowing TIPS placement. During this maneuver, there was partial opacification hepatic arteries in the liver with injection the outer coaxial catheter. It did appear that this hepatic arterial puncture was most likely intrahepatic and not extrahepatic. After placement of the covered stent, there is excellent flow via the covered stent from the main portal vein into the attic vein and right atrium. No significant intrahepatic portal flow is identified. During the procedure, it was noted that despite near complete evacuation of right pleural fluid, there remained poor aeration of the right lung without evidence of pneumothorax. This is felt to be most likely a combination atelectasis and potentially post expansion pulmonary edema in the right lung. IMPRESSION: 1. Successful TIPS procedure with placement of a 10 mm Viatorr covered stent prosthesis (2 cm uncovered portal vein segment, 6 cm covered intrahepatic segment) extending from the main portal vein into the right hepatic vein with excellent flow present after stent placement. 2. Right thoracentesis and paracentesis were performed just prior to initiating the TIPS procedure to decrease the risk of intraperitoneal bleeding and also to try to improve ventilation under general anesthesia. As above, there appear to be poor aeration of the right lung despite near complete evacuation of right pleural fluid felt to be most likely a combination of atelectasis and potentially post expansion pulmonary edema. Patient will be admitted to the hospitalist service for observation. Outpatient follow-up with a TIPS ultrasound in 1 month. Electronically Signed   By: Aletta Edouard M.D.   On: 07/26/2019 15:09   DG Chest Port 1 View  Result Date: 07/27/2019 CLINICAL DATA:  Shortness of breath. Pneumonia. EXAM: PORTABLE CHEST 1 VIEW COMPARISON:  07/26/2019 FINDINGS: Increased right lung consolidation volume loss is seen, with air bronchograms. Probable layering right  pleural effusion which is increased in size. Left lung is clear. Heart size is within normal limits. IMPRESSION: 1. Increased right lung consolidation and volume lossa. 2. Probable increased size of layering right pleural effusion. Electronically Signed   By: Marlaine Hind M.D.   On: 07/27/2019 10:07   DG CHEST PORT 1 VIEW  Result Date: 07/26/2019 CLINICAL DATA:  Acute respiratory failure, history cirrhosis, celiac disease, hypertension EXAM: PORTABLE CHEST 1 VIEW COMPARISON:  Portable exam 1451 hours compared to 07/15/2019 FINDINGS: Normal heart size mediastinal contours. Extensive BILATERAL airspace infiltrates RIGHT greater than LEFT with air bronchograms in the mid to lower RIGHT lung. No pleural effusion or pneumothorax. Osseous structures unremarkable. Gaseous distention of stomach. IMPRESSION: Extensive airspace infiltrates in both lungs RIGHT much greater than LEFT, may represent multifocal pneumonia, asymmetric edema or ARDS. Electronically Signed   By: Lavonia Dana M.D.   On: 07/26/2019 16:20   IR Paracentesis  Result Date: 07/26/2019 INDICATION: History of alcoholic cirrhosis with refractory ascites; request made for therapeutic paracentesis EXAM: ULTRASOUND GUIDED PARACENTESIS MEDICATIONS: None COMPLICATIONS: None immediate. PROCEDURE: Informed written consent was obtained from the patient after a discussion of  the risks, benefits and alternatives to treatment. A timeout was performed prior to the initiation of the procedure. Initial ultrasound scanning demonstrates a large amount of ascites within the right lower abdominal quadrant. The right lower abdomen was prepped and draped in the usual sterile fashion. Patient was intubated and sedated for TIPS procedure; no local anesthetic used. Following this, a 19 gauge, 7-cm, Yueh catheter was introduced. An ultrasound image was saved for documentation purposes. The paracentesis was performed. The catheter was removed and a dressing was applied. The  patient tolerated the procedure well without immediate post procedural complication. FINDINGS: A total of approximately 2.4L of clear yellow fluid was removed. IMPRESSION: Successful ultrasound-guided paracentesis yielding 2.4 liters of peritoneal fluid. This was performed at the time of a TIPS procedure. Read by: Soyla Dryer, NP Electronically Signed   By: Aletta Edouard M.D.   On: 07/26/2019 13:35   IR THORACENTESIS ASP PLEURAL SPACE W/IMG GUIDE  Result Date: 07/26/2019 INDICATION: Patient with a history of alcoholic cirrhosis; request for therapeutic thoracentesis for hepatic hydrothorax EXAM: ULTRASOUND GUIDED RIGHT THORACENTESIS MEDICATIONS: None. COMPLICATIONS: None immediate. PROCEDURE: An ultrasound guided thoracentesis was thoroughly discussed with the patient and questions answered. The benefits, risks, alternatives and complications were also discussed. The patient understands and wishes to proceed with the procedure. Written consent was obtained. Ultrasound was performed to localize and mark an adequate pocket of fluid in the right chest. The area was then prepped and draped in the normal sterile fashion. Under ultrasound guidance a 6 Fr Safe-T-Centesis catheter was introduced. Thoracentesis was performed. The catheter was removed and a dressing applied. Patient was intubated and sedated for a TIPS procedure, no local anesthetic used. FINDINGS: A total of approximately 4.6 L of clear yellow fluid was removed. IMPRESSION: Successful ultrasound guided right thoracentesis yielding 4.6L of pleural fluid. The procedure immediately preceded a TIPS procedure. Read by: Soyla Dryer, NP Electronically Signed   By: Aletta Edouard M.D.   On: 07/26/2019 13:44    Labs:  CBC: Recent Labs    07/26/19 1638 07/27/19 0354 07/27/19 1506 07/29/19 0359  WBC 10.5 8.9 8.9 5.8  HGB 13.2 10.0* 10.0* 10.4*  HCT 38.8* 29.3* 29.3* 30.2*  PLT 153 115* 105* 102*    COAGS: Recent Labs    06/30/19 1420  07/01/19 0818 07/03/19 0620 07/04/19 0235 07/19/19 1411 07/26/19 0632  INR 1.2   < > 1.3* 1.3* 1.1 1.1  APTT 34  --   --   --   --   --    < > = values in this interval not displayed.    BMP: Recent Labs    07/19/19 1411 07/19/19 1411 07/26/19 0632 07/26/19 1111 07/27/19 0354 07/29/19 0359  NA 131*   < > 132* 134* 137 133*  K 4.6   < > 4.0 3.8 4.3 3.7  CL 99  --  100  --  106 105  CO2 25  --  22  --  19* 23  GLUCOSE 113*  --  99  --  118* 107*  BUN 10  --  14  --  15 11  CALCIUM 8.5*  --  8.5*  --  8.5* 7.8*  CREATININE 1.14  --  1.06  --  1.05 0.67  GFRNONAA >60  --  >60  --  >60 >60  GFRAA >60  --  >60  --  >60 >60   < > = values in this interval not displayed.    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  S/p para, thora, and TIPS by [facility] 07/26/19.  Patient ambulated on room air yesterday evening and maintained O2 saturations greater than 92%. Discussed discharge plans with the patient today. Patient will follow up with [facility]/Dr. Kathlene Cote in one month. A scheduler with [facility] will call the patient to set up this appointment. Patient is ok to remove dressings and shower. Patient needs to wait one week post-tips to submerge the punctures sites (bath, pool, etc.)  Patient will follow up with his PCP and other consulting physicians per discharge orders.    Electronically Signed: Theresa Duty, NP 07/29/2019, 11:29 AM   I spent a total of 15 Minutes at the the patient's bedside AND on the patient's hospital floor or unit, greater than 50% of which was counseling/coordinating care for follow up TIPS.

## 2019-07-29 NOTE — Care Management Important Message (Signed)
Important Message  Patient Details  Name: Shawn Martin MRN: [id number] Date of Birth: 10/22/1975   Medicare Important Message Given:  Yes     Thaily Hackworth 07/29/2019, 2:04 PM

## 2019-07-31 LAB — BPAM RBC
Blood Product Expiration Date: 202105212359
Blood Product Expiration Date: 202105212359
Unit Type and Rh: 6200
Unit Type and Rh: 6200

## 2019-07-31 LAB — TYPE AND SCREEN
ABO/RH(D): A POS
Antibody Screen: POSITIVE
Donor AG Type: NEGATIVE
Donor AG Type: NEGATIVE
PT AG Type: NEGATIVE
Unit division: 0
Unit division: 0

## 2019-08-04 ENCOUNTER — Other Ambulatory Visit (INDEPENDENT_AMBULATORY_CARE_PROVIDER_SITE_OTHER): Payer: Self-pay

## 2019-08-04 ENCOUNTER — Encounter: Payer: Self-pay | Admitting: Physician Assistant

## 2019-08-04 ENCOUNTER — Ambulatory Visit (INDEPENDENT_AMBULATORY_CARE_PROVIDER_SITE_OTHER): Payer: Self-pay | Admitting: Physician Assistant

## 2019-08-04 ENCOUNTER — Other Ambulatory Visit: Payer: Self-pay | Admitting: Interventional Radiology

## 2019-08-04 ENCOUNTER — Other Ambulatory Visit: Payer: Self-pay

## 2019-08-04 ENCOUNTER — Ambulatory Visit (INDEPENDENT_AMBULATORY_CARE_PROVIDER_SITE_OTHER)
Admission: RE | Admit: 2019-08-04 | Discharge: 2019-08-04 | Disposition: A | Discharge status: Home / Self Care | Payer: Self-pay | Source: Ambulatory Visit | Attending: Physician Assistant | Admitting: Physician Assistant

## 2019-08-04 VITALS — BP 120/70 | HR 76 | Temp 97.1°F | Ht 71.0 in | Wt 183.6 lb

## 2019-08-04 DIAGNOSIS — R0602 Shortness of breath: Secondary | ICD-10-CM

## 2019-08-04 DIAGNOSIS — K7031 Alcoholic cirrhosis of liver with ascites: Secondary | ICD-10-CM

## 2019-08-04 LAB — CBC WITH DIFFERENTIAL/PLATELET
Basophils Absolute: 0 10*3/uL (ref 0.0–0.1)
Basophils Relative: 1.1 % (ref 0.0–3.0)
Eosinophils Absolute: 0.2 10*3/uL (ref 0.0–0.7)
Eosinophils Relative: 4.2 % (ref 0.0–5.0)
HCT: 36.6 % — ABNORMAL LOW (ref 39.0–52.0)
Hemoglobin: 12.6 g/dL — ABNORMAL LOW (ref 13.0–17.0)
Lymphocytes Relative: 39.9 % (ref 12.0–46.0)
Lymphs Abs: 1.8 10*3/uL (ref 0.7–4.0)
MCHC: 34.4 g/dL (ref 30.0–36.0)
MCV: 96.1 fl (ref 78.0–100.0)
Monocytes Absolute: 0.8 10*3/uL (ref 0.1–1.0)
Monocytes Relative: 17.6 % — ABNORMAL HIGH (ref 3.0–12.0)
Neutro Abs: 1.7 10*3/uL (ref 1.4–7.7)
Neutrophils Relative %: 37.2 % — ABNORMAL LOW (ref 43.0–77.0)
Platelets: 133 10*3/uL — ABNORMAL LOW (ref 150.0–400.0)
RBC: 3.81 Mil/uL — ABNORMAL LOW (ref 4.22–5.81)
RDW: 19.7 % — ABNORMAL HIGH (ref 11.5–15.5)
WBC: 4.6 10*3/uL (ref 4.0–10.5)

## 2019-08-04 LAB — COMPREHENSIVE METABOLIC PANEL
ALT: 32 U/L (ref 0–53)
AST: 55 U/L — ABNORMAL HIGH (ref 0–37)
Albumin: 3.3 g/dL — ABNORMAL LOW (ref 3.5–5.2)
Alkaline Phosphatase: 125 U/L — ABNORMAL HIGH (ref 39–117)
BUN: 8 mg/dL (ref 6–23)
CO2: 27 mEq/L (ref 19–32)
Calcium: 8.3 mg/dL — ABNORMAL LOW (ref 8.4–10.5)
Chloride: 101 mEq/L (ref 96–112)
Creatinine, Ser: 1.06 mg/dL (ref 0.40–1.50)
GFR: 75.21 mL/min (ref >60.00)
Glucose, Bld: 97 mg/dL (ref 70–99)
Potassium: 3.8 mEq/L (ref 3.5–5.1)
Sodium: 134 mEq/L — ABNORMAL LOW (ref 135–145)
Total Bilirubin: 1.2 mg/dL (ref 0.2–1.2)
Total Protein: 6.1 g/dL (ref 6.0–8.3)

## 2019-08-04 NOTE — Progress Notes (Signed)
Chief Complaint: Follow-up alcoholic cirrhosis of the liver with ascites  HPI:    Mr. Shawn Martin is a 44 year old male with a past medical history as listed below including alcoholic cirrhosis, known to Dr. Havery Moros, who presents to clinic today for follow-up of his alcoholic cirrhosis.    02/08/2019 EGD with grade 2 mid/distal esophageal varices, portal hypertensive gastropathy, diffuse mucosal flattening, scalloping and D2.  Pathology showed increased intraepithelial lymphocytes and villous blunting in the duodenum.  Gastric biopsy showed mild reactive gastropathy with no H. pylori, metaplasia, dysplasia or malignancy.    06/30/2019 patient seen in the hospital by her service for refractory ascites and hyponatremia (please see that detailed consult note for further info).  At that time had refractory/recurrent ascites with 4 large volume paracentesis between 2/24 and 3/24.  He had a repeat paracentesis during that admission.  At discharge 07/04/2019 it was discussed that he had decompensated with refractory ascites and hyponatremia status post several liters paracentesis x2 during hospitalization with improvement in ascites and sodium.  He had mild AKI in the setting of higher dosing of diuretics and Bactrim which was added for SBP prophylaxis.  At that time is recommended he resume his Lasix 40 twice daily and Aldactone 200 mg given AKI with low sodium on admission, plan for be met at the end of the week, repeat large-volume paracentesis and he was scheduled hepatology consult.  At that time he was scheduled for an EGD at the hospital with banding in the next few weeks as they had to stop propranolol due to recurrent ascites.    07/26/2019-07/29/2019 patient admitted for TIPS procedure patient underwent TIPS procedure and developed acute respiratory failure post procedure requiring BiPAP and ICU admission.    07/29/2019 chest x-ray showed persistent large airspace opacity at the right lung base, presumably  combination of consolidation and pleural effusion, but improved aeration of the right upper lung.  Persistent right-sided pleural effusion, small to moderate in size in the left lung was clear.    Today, the patient tells me he is doing much better because he "like to have died" during his last admission for his TIPS procedure when he ended up in the ICU for a few days on BiPAP due to acute respiratory failure.  Explains that since being discharged he has felt well.  Does have some shortness of breath if he is trying to be active, but has no further buildup of fluid in his stomach.  Tells me he is not currently using his Lactulose because he is already having bowel movements 3-4 times a day.    Denies fever, chills, confusion or any other new GI symptoms.   Past Medical History:  Diagnosis Date  . Celiac disease   . Hepatic cirrhosis ([place])   . Hypertension     Past Surgical History:  Procedure Laterality Date  . IR PARACENTESIS  02/07/2019  . IR PARACENTESIS  05/25/2019  . IR PARACENTESIS  06/10/2019  . IR PARACENTESIS  06/15/2019  . IR PARACENTESIS  06/22/2019  . IR PARACENTESIS  07/01/2019  . IR PARACENTESIS  07/12/2019  . IR PARACENTESIS  07/19/2019  . IR PARACENTESIS  07/26/2019  . IR RADIOLOGIST EVAL & MGMT  07/07/2019  . IR THORACENTESIS ASP PLEURAL SPACE W/IMG GUIDE  07/26/2019  . IR TIPS  07/26/2019  . IR TRANSCATHETER BX  06/22/2019  . IR US GUIDE VASC ACCESS RIGHT  06/22/2019  . IR VENOGRAM HEPATIC WO HEMODYNAMIC EVALUATION  06/22/2019  . NOSE SURGERY    .  RADIOLOGY WITH ANESTHESIA N/A 07/26/2019   Procedure: TIPS;  Surgeon: Aletta Edouard, MD;  Location: [facility];  Service: Radiology;  Laterality: N/A;    Current Outpatient Medications  Medication Sig Dispense Refill  . cetirizine (ZYRTEC) 10 MG tablet Take 10 mg by mouth daily.    . ferrous sulfate 325 (65 FE) MG tablet Take 325 mg by mouth daily.    . furosemide (LASIX) 40 MG tablet Take 1 tablet (40 mg total) by mouth 2 (two) times  daily. 60 tablet 0  . lactulose (CHRONULAC) 10 GM/15ML solution Take 10g daily to up to three times a day to keep BM at 3-4 times a day, please call your GI doctor if you start to feel confused. 236 mL 0  . magnesium gluconate (MAGONATE) 500 MG tablet Take 500 mg by mouth daily as needed (cramps).    . Multiple Vitamin (MULTIVITAMIN WITH MINERALS) TABS tablet Take 1 tablet by mouth daily.    Marland Kitchen spironolactone (ALDACTONE) 100 MG tablet Take 1 tablet (100 mg total) by mouth daily. 60 tablet 0   No current facility-administered medications for this visit.   Facility-Administered Medications Ordered in Other Visits  Medication Dose Route Frequency Provider Last Rate Last Admin  . 0.9 %  sodium chloride infusion   Intravenous Continuous Monia Sabal, PA-C        Allergies as of 08/04/2019 - Review Complete 07/26/2019  Allergen Reaction Noted  . Gluten meal Diarrhea 03/21/2019    Family History  Problem Relation Age of Onset  . Healthy Mother   . Hypertension Father   . Heart attack Father   . Leukemia Brother   . Colon cancer Neg Hx   . Stomach cancer Neg Hx   . Pancreatic cancer Neg Hx     Social History   Socioeconomic History  . Marital status: Married    Spouse name: Not on file  . Number of children: Not on file  . Years of education: Not on file  . Highest education level: Not on file  Occupational History  . Not on file  Tobacco Use  . Smoking status: Never Smoker  . Smokeless tobacco: Former Systems developer    Types: Chew  Substance and Sexual Activity  . Alcohol use: Not Currently    Comment: every day drinker, 6 pack per day  . Drug use: Never  . Sexual activity: Yes    Partners: Female  Other Topics Concern  . Not on file  Social History Narrative  . Not on file   Social Determinants of Health   Financial Resource Strain:   . Difficulty of Paying Living Expenses:   Food Insecurity:   . Worried About Charity fundraiser in the Last Year:   . Arboriculturist in  the Last Year:   Transportation Needs:   . Film/video editor (Medical):   Marland Kitchen Lack of Transportation (Non-Medical):   Physical Activity:   . Days of Exercise per Week:   . Minutes of Exercise per Session:   Stress:   . Feeling of Stress :   Social Connections:   . Frequency of Communication with Friends and Family:   . Frequency of Social Gatherings with Friends and Family:   . Attends Religious Services:   . Active Member of Clubs or Organizations:   . Attends Archivist Meetings:   Marland Kitchen Marital Status:   Intimate Partner Violence:   . Fear of Current or Ex-Partner:   . Emotionally Abused:   .  Physically Abused:   . Sexually Abused:     Review of Systems:    Constitutional: No weight loss, fever or chills Cardiovascular: No chest pain Respiratory: No SOB  Gastrointestinal: See HPI and otherwise negative   Physical Exam:  Vital signs: BP 120/70   Pulse 76   Temp (!) 97.1 F (36.2 C)   Ht 5' 11"  (1.803 m)   Wt 183 lb 9.6 oz (83.3 kg)   BMI 25.61 kg/m   Constitutional:   Pleasant Caucasian male appears to be in NAD, Well developed, Well nourished, alert and cooperative Respiratory: Respirations decreased RLL, unlabored. Lungs clear to auscultation bilaterally.   No wheezes, crackles, or rhonchi.  Cardiovascular: Normal S1, S2. No MRG. Regular rate and rhythm. No peripheral edema, cyanosis or pallor.  Gastrointestinal:  Soft, nondistended, nontender. No rebound or guarding. Normal bowel sounds. No appreciable masses or hepatomegaly. Rectal:  Not performed.  Psychiatric: Oriented to person, place and time. Demonstrates good judgement and reason without abnormal affect or behaviors.  RELEVANT LABS AND IMAGING: CBC    Component Value Date/Time   WBC 5.8 07/29/2019 0359   RBC 3.23 (L) 07/29/2019 0359   HGB 10.4 (L) 07/29/2019 0359   HCT 30.2 (L) 07/29/2019 0359   PLT 102 (L) 07/29/2019 0359   MCV 93.5 07/29/2019 0359   MCH 32.2 07/29/2019 0359   MCHC 34.4  07/29/2019 0359   RDW 18.7 (H) 07/29/2019 0359   LYMPHSABS 1.8 07/29/2019 0359   MONOABS 0.8 07/29/2019 0359   EOSABS 0.3 07/29/2019 0359   BASOSABS 0.0 07/29/2019 0359    CMP     Component Value Date/Time   NA 133 (L) 07/29/2019 0359   NA 133 (L) 07/07/2019 1356   K 3.7 07/29/2019 0359   CL 105 07/29/2019 0359   CO2 23 07/29/2019 0359   GLUCOSE 107 (H) 07/29/2019 0359   BUN 11 07/29/2019 0359   BUN 18 07/07/2019 1356   CREATININE 0.67 07/29/2019 0359   CALCIUM 7.8 (L) 07/29/2019 0359   PROT 4.2 (L) 07/29/2019 0359   ALBUMIN 2.3 (L) 07/29/2019 0359   AST 76 (H) 07/29/2019 0359   ALT 42 07/29/2019 0359   ALKPHOS 64 07/29/2019 0359   BILITOT 1.6 (H) 07/29/2019 0359   GFRNONAA >60 07/29/2019 0359   GFRAA >60 07/29/2019 0359    Assessment: 1.  Alcoholic cirrhosis with ascites: Status post TIPS 07/26/2019 2.  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  Repeat chest x-ray. 2.  Ordered CBC and CMP. 3.  Continue Lasix 40 mg twice daily and spironolactone 100 mg daily 4.  Patient to follow in clinic per recommendations from Dr. Havery Moros after testing above.  Patient tells me that he will likely need a note that writes him back into work when this is appropriate.  He also has plans to establish with a PCP soon.  Tells me he does not have any follow-up set up with the liver clinic.  Ellouise Newer, PA-C [facility] 08/04/2019, 1:57 PM

## 2019-08-04 NOTE — Progress Notes (Signed)
Agree with assessment as outlined with the following thoughts: Decompensated cirrhosis due to alcohol s/p extensive evaluation including liver biopsy. Hepatology does not think he has autoimmune hepatitis. He had refractory ascites and did not tolerate higher doses of diuretics due to AKI and hyponatremia. He was admitted for TIPS to treat this refractory ascites and his varices but complicated by respiratory failure and he also has a hepatic hydrothorax.  Labs looks stable on his regimen and would continue meds. His repeat Xray shows persistent pleural effusion - he needs to come in to check his oxygen sat if that was not done to ensure stable. I am concerned about risks of increasing his diuretics further given intolerance to this in the past with AKI and electrolyte abnormalities. I think he should follow up with IR for another possible thoracentesis if your staff can help coordinate that Anderson Malta in the near future. Is his ascites better after the TIPS? He should have another follow up visit with our clinic in the next few weeks as well as Hepatology. He needs to remain completely abstinent from alcohol.

## 2019-08-04 NOTE — Patient Instructions (Addendum)
If you are age 46 or older, your body mass index should be between 23-30. Your Body mass index is 25.61 kg/m. If this is out of the aforementioned range listed, please consider follow up with your Primary Care Provider.  If you are age 34 or younger, your body mass index should be between 19-25. Your Body mass index is 25.61 kg/m. If this is out of the aformentioned range listed, please consider follow up with your Primary Care Provider.   Your provider has requested that you have a chest x ray before leaving today. Please go to the basement floor to our Radiology department for the test.  Your provider has requested that you go to the basement level for lab work before leaving today. Press "B" on the elevator. The lab is located at the first door on the left as you exit the elevator.  Continue current medication.

## 2019-08-05 ENCOUNTER — Telehealth: Payer: Self-pay | Admitting: Physician Assistant

## 2019-08-05 ENCOUNTER — Telehealth: Payer: Self-pay

## 2019-08-05 DIAGNOSIS — K7031 Alcoholic cirrhosis of liver with ascites: Secondary | ICD-10-CM

## 2019-08-05 DIAGNOSIS — R0602 Shortness of breath: Secondary | ICD-10-CM

## 2019-08-05 NOTE — Telephone Encounter (Signed)
Patient called wants to let you know that he has not been able to reach Dr. Margaretmary Dys office to schedule.

## 2019-08-05 NOTE — Telephone Encounter (Signed)
-----   Message from Levin Erp, [place] sent at 08/05/2019 10:06 AM EDT ----- Regarding: FW: mutual patient  ----- Message ----- From: [name], MD Sent: 08/05/2019   9:08 AM EDT To: Levin Erp, PA Subject: FW: mutual patient                             Farris Has, just an [place]. Dr . Kathlene Cote will plan on thoracentesis if you can have the staff let him know to call their office. Thanks ----- Message ----- From: Aletta Edouard, MD Sent: 08/05/2019   8:51 AM EDT To: [name], MD Subject: RE: mutual patient                             Shawn Martin,  Repeat thora is fine, although I think part of his problem was some re-expansion pulm edema in that lung when we took off so much fluid at time of TIPS. If he has moderate recurrent pleural fluid, I might not take it all off (maybe like a 2L limit) to avoid repeat of edema in that lung.  Thanks,  Eulas Post  ----- Message ----- From: [name], MD Sent: 08/04/2019  10:39 PM EDT To: Aletta Edouard, MD Subject: mutual patient                                 Rhetta Mura, [facility] you are well. Wanted to touch base about this patient post TIPS. I know he had some respiratory issues post procedure, he continues to have this persistent pleural effusion on the right, thought to be hepatic hydrothorax. Seems slightly smaller perhaps on recent xray but has some ongoing symptoms. His course has been complicated in the past due to problems with AKI and electrolyte imbalances from higher doses of diuretics so I'm concerned about raising his dose of those right now. Was curious what you thought about a repeat thoracentesis. Thanks for your help with his case.  Shawn Martin

## 2019-08-05 NOTE — Telephone Encounter (Signed)
ROV scheduled with Amy Esterwood on 08/19/19 at 130 pm.  Pt advised via My Chart.  I did confirm that pt reads My Chart messages

## 2019-08-05 NOTE — Telephone Encounter (Signed)
I have sent a message to the pt via My Chart to send a message to Dr Kathlene Cote or call again on Monday for an appt.

## 2019-08-05 NOTE — Telephone Encounter (Signed)
-----   Message from Levin Erp, [place] sent at 08/05/2019  8:27 AM EDT -----   ----- Message ----- From: [name], MD Sent: 08/04/2019  10:32 PM EDT To: Levin Erp, PA    ----- Message ----- From: Levin Erp, [place] Sent: 08/04/2019   2:25 PM EDT To: [name], MD

## 2019-08-05 NOTE — Progress Notes (Signed)
Patty- can you see Dr. Ozella Rocks recs. Needs IR visit for persistent pleural effusion and thoracentesis, also needs to have follow up appt in 2 weeks with me/ other APP or most preferably Dr. Havery Moros. Can you please help me coordinate and let patient know he should follow with the liver clinic as well.  Thanks-JLL

## 2019-08-05 NOTE — Telephone Encounter (Signed)
Author: Levin Erp, PA Service: Gastroenterology Author Type: Physician Assistant  Filed: 08/05/2019 8:29 AM Encounter Date: 08/04/2019 Status: Signed  Editor: Levin Erp, PA (Physician Assistant)     Show:Clear all [x] Manual[] Template[] Copied  Added by: [x] Levin Erp, PA  [] Hover for details Chong Sicilian- can you see Dr. Verdie Shire. Needs IR visit for persistent pleural effusion and thoracentesis, also needs to have follow up appt in 2 weeks with me/ other APP or most preferably Dr. Havery Moros. Can you please help me coordinate and let patient know he should follow with the liver clinic as well.  Thanks-JLL

## 2019-08-08 ENCOUNTER — Telehealth: Payer: Self-pay | Admitting: Physician Assistant

## 2019-08-08 ENCOUNTER — Other Ambulatory Visit: Payer: Self-pay

## 2019-08-08 MED ORDER — SPIRONOLACTONE 100 MG PO TABS: 100.0000 mg | ORAL_TABLET | Freq: Every day | ORAL | 6 refills | Status: DC

## 2019-08-08 MED ORDER — LACTULOSE 10 GM/15ML PO SOLN: ORAL | 1 refills | Status: DC

## 2019-08-08 NOTE — Telephone Encounter (Signed)
Patient called in reference to refill on Lactulose patient stated he was told he can get it refill at a pharmacy across the street from the hospital for he does not have insurance. Patient did not recall the name given.

## 2019-08-08 NOTE — Telephone Encounter (Signed)
Script sent to [facility]. Patient informed.

## 2019-08-17 MED FILL — LACTULOSE 10 GM/15 ML SOLN: 10 | 5 days supply | Qty: 236 | Fill #1

## 2019-08-19 ENCOUNTER — Other Ambulatory Visit (INDEPENDENT_AMBULATORY_CARE_PROVIDER_SITE_OTHER): Payer: Self-pay

## 2019-08-19 ENCOUNTER — Ambulatory Visit (INDEPENDENT_AMBULATORY_CARE_PROVIDER_SITE_OTHER): Payer: Self-pay | Admitting: Physician Assistant

## 2019-08-19 ENCOUNTER — Encounter: Payer: Self-pay | Admitting: Physician Assistant

## 2019-08-19 ENCOUNTER — Ambulatory Visit (INDEPENDENT_AMBULATORY_CARE_PROVIDER_SITE_OTHER)
Admission: RE | Admit: 2019-08-19 | Discharge: 2019-08-19 | Disposition: A | Discharge status: Home / Self Care | Payer: Self-pay | Source: Ambulatory Visit | Attending: Physician Assistant | Admitting: Physician Assistant

## 2019-08-19 ENCOUNTER — Other Ambulatory Visit: Payer: Self-pay

## 2019-08-19 VITALS — BP 120/80 | HR 94 | Ht 71.0 in | Wt 182.0 lb

## 2019-08-19 DIAGNOSIS — B1920 Unspecified viral hepatitis C without hepatic coma: Secondary | ICD-10-CM

## 2019-08-19 DIAGNOSIS — J90 Pleural effusion, not elsewhere classified: Secondary | ICD-10-CM

## 2019-08-19 DIAGNOSIS — Z95828 Presence of other vascular implants and grafts: Secondary | ICD-10-CM

## 2019-08-19 DIAGNOSIS — K7469 Other cirrhosis of liver: Secondary | ICD-10-CM

## 2019-08-19 LAB — PROTIME-INR
INR: 1.2 ratio — ABNORMAL HIGH (ref 0.8–1.0)
Prothrombin Time: 13.5 s — ABNORMAL HIGH (ref 9.6–13.1)

## 2019-08-19 LAB — CBC WITH DIFFERENTIAL/PLATELET
Basophils Absolute: 0 10*3/uL (ref 0.0–0.1)
Basophils Relative: 1 % (ref 0.0–3.0)
Eosinophils Absolute: 0.2 10*3/uL (ref 0.0–0.7)
Eosinophils Relative: 6.5 % — ABNORMAL HIGH (ref 0.0–5.0)
HCT: 38.5 % — ABNORMAL LOW (ref 39.0–52.0)
Hemoglobin: 13.2 g/dL (ref 13.0–17.0)
Lymphocytes Relative: 39.5 % (ref 12.0–46.0)
Lymphs Abs: 1.4 10*3/uL (ref 0.7–4.0)
MCHC: 34.4 g/dL (ref 30.0–36.0)
MCV: 96.4 fl (ref 78.0–100.0)
Monocytes Absolute: 0.6 10*3/uL (ref 0.1–1.0)
Monocytes Relative: 17.1 % — ABNORMAL HIGH (ref 3.0–12.0)
Neutro Abs: 1.3 10*3/uL — ABNORMAL LOW (ref 1.4–7.7)
Neutrophils Relative %: 35.9 % — ABNORMAL LOW (ref 43.0–77.0)
Platelets: 128 10*3/uL — ABNORMAL LOW (ref 150.0–400.0)
RBC: 3.99 Mil/uL — ABNORMAL LOW (ref 4.22–5.81)
RDW: 19.5 % — ABNORMAL HIGH (ref 11.5–15.5)
WBC: 3.5 10*3/uL — ABNORMAL LOW (ref 4.0–10.5)

## 2019-08-19 LAB — COMPREHENSIVE METABOLIC PANEL
ALT: 22 U/L (ref 0–53)
AST: 54 U/L — ABNORMAL HIGH (ref 0–37)
Albumin: 3.6 g/dL (ref 3.5–5.2)
Alkaline Phosphatase: 194 U/L — ABNORMAL HIGH (ref 39–117)
BUN: 10 mg/dL (ref 6–23)
CO2: 31 mEq/L (ref 19–32)
Calcium: 9.4 mg/dL (ref 8.4–10.5)
Chloride: 97 mEq/L (ref 96–112)
Creatinine, Ser: 0.85 mg/dL (ref 0.40–1.50)
GFR: 97.02 mL/min (ref >60.00)
Glucose, Bld: 109 mg/dL — ABNORMAL HIGH (ref 70–99)
Potassium: 3.7 mEq/L (ref 3.5–5.1)
Sodium: 134 mEq/L — ABNORMAL LOW (ref 135–145)
Total Bilirubin: 2.6 mg/dL — ABNORMAL HIGH (ref 0.2–1.2)
Total Protein: 7.1 g/dL (ref 6.0–8.3)

## 2019-08-19 LAB — AMMONIA: Ammonia: 22 umol/L (ref 11–35)

## 2019-08-19 MED ORDER — FUROSEMIDE 40 MG PO TABS: 40.0000 mg | ORAL_TABLET | Freq: Every day | ORAL | 1 refills | Status: DC

## 2019-08-19 MED ORDER — LACTULOSE 10 GM/15ML PO SOLN: ORAL | 1 refills | Status: DC

## 2019-08-19 MED ORDER — SPIRONOLACTONE 100 MG PO TABS: 100.0000 mg | ORAL_TABLET | Freq: Every day | ORAL | 1 refills | Status: DC

## 2019-08-19 MED FILL — LACTULOSE 10 GM/15 ML SOLN: 10 | 30 days supply | Qty: 900 | Fill #0

## 2019-08-19 MED FILL — FUROSEMIDE 40 MG TAB: 40 | 30 days supply | Qty: 30 | Fill #0

## 2019-08-19 NOTE — Progress Notes (Signed)
Subjective:    Patient ID: Shawn Martin, male    DOB: 10/14/1975, 44 y.o.   MRN: [id number]  [place] is a pleasant 44 year old white male, known to Dr. Havery Moros who comes in today for follow-up of decompensated cirrhosis.  His course has been complicated by refractory ascites, hepatic hydrothorax, with previous hypoxia and acute kidney injury. He is status post TIPS on 07/26/2019. He was last seen in the office on 08/04/2019 and at that time was still complaining of shortness of breath.  Chest x-ray was done which showed a persistent large right pleural effusion.  He was continued on his same dose of diuretics. He was to be set up for another thoracentesis per IR, but I do not think that is actually scheduled. He comes in today stating that he is feeling really good.  He is really excited about starting to feel more normal.  He is still out of work but says he is getting his strength back and is actually getting bored at home and looks forward to going back to work when he is released.  He has been able to get out and walk in his neighborhood and is usually walking 3-4 blocks per day.  He does not have any residual ascites or abdominal distention, he denies any shortness of breath even with walking.  He is maintaining abstinence from alcohol. He has follow-up early next week with IR for Doppler/TIPS follow-up on Monday, and then virtual visit with Dr. Kathlene Cote of the following day.  Last labs were done on 08/04/2019, at that time creatinine 1.06/T bili 1.2/alk phos 125/AST 55/ALT 32, platelets 133 and hemoglobin of 12.6.  Weight has been stable, down 1 pound from last office visit  He is taking 15 cc of lactulose 3 times daily and says he is having 6 or 7 bowel movements per day with some urgency.  He is also frustrated because he is not being given enough lactulose to last him for more than about a week at a time from the pharmacy.  Review of Systems Pertinent positive and negative review of  systems were noted in the above HPI section.  All other review of systems was otherwise negative.  Outpatient Encounter Medications as of 08/19/2019  Medication Sig  . cetirizine (ZYRTEC) 10 MG tablet Take 10 mg by mouth daily.  . ferrous sulfate 325 (65 FE) MG tablet Take 325 mg by mouth daily.  . furosemide (LASIX) 40 MG tablet Take 1 tablet (40 mg total) by mouth daily.  Marland Kitchen lactulose (CHRONULAC) 10 GM/15ML solution Take 15cc by mouth twice  daily t please call your GI doctor if you start to feel confused.  . magnesium gluconate (MAGONATE) 500 MG tablet Take 500 mg by mouth daily as needed (cramps).  . Multiple Vitamin (MULTIVITAMIN WITH MINERALS) TABS tablet Take 1 tablet by mouth daily.  Marland Kitchen spironolactone (ALDACTONE) 100 MG tablet Take 1 tablet (100 mg total) by mouth daily.  . [DISCONTINUED] furosemide (LASIX) 40 MG tablet Take 1 tablet (40 mg total) by mouth 2 (two) times daily.  . [DISCONTINUED] lactulose (CHRONULAC) 10 GM/15ML solution Take 10g daily to up to three times a day to keep BM at 3-4 times a day, please call your GI doctor if you start to feel confused.  . [DISCONTINUED] spironolactone (ALDACTONE) 100 MG tablet Take 1 tablet (100 mg total) by mouth daily.   Facility-Administered Encounter Medications as of 08/19/2019  Medication  . 0.9 %  sodium chloride infusion  Allergies  Allergen Reactions  . Gluten Meal Diarrhea    bloating   Patient Active Problem List   Diagnosis Date Noted  . Cirrhosis ([place]) 07/26/2019  . Acute respiratory failure with hypoxia ([place]) 07/26/2019  . Hyperkalemia 06/30/2019  . AKI (acute kidney injury) ([place]) 06/30/2019  . Hyponatremia 06/03/2019  . Alcohol dependence ([place]) 06/03/2019  . Symptomatic anemia 06/03/2019  . Ascites due to alcoholic cirrhosis ([place]) 85/46/2703  . Alcoholic cirrhosis of liver with ascites ([place]) 01/28/2019  . Screening for viral disease 01/28/2019   Social History   Socioeconomic History  . Marital status: Married      Spouse name: Not on file  . Number of children: 2  . Years of education: Not on file  . Highest education level: Not on file  Occupational History  . Not on file  Tobacco Use  . Smoking status: Never Smoker  . Smokeless tobacco: Former Systems developer    Types: Chew  Substance and Sexual Activity  . Alcohol use: Not Currently    Comment: every day drinker, 6 pack per day  . Drug use: Not Currently  . Sexual activity: Yes    Partners: Female  Other Topics Concern  . Not on file  Social History Narrative  . Not on file   Social Determinants of Health   Financial Resource Strain:   . Difficulty of Paying Living Expenses:   Food Insecurity:   . Worried About Charity fundraiser in the Last Year:   . Arboriculturist in the Last Year:   Transportation Needs:   . Film/video editor (Medical):   Marland Kitchen Lack of Transportation (Non-Medical):   Physical Activity:   . Days of Exercise per Week:   . Minutes of Exercise per Session:   Stress:   . Feeling of Stress :   Social Connections:   . Frequency of Communication with Friends and Family:   . Frequency of Social Gatherings with Friends and Family:   . Attends Religious Services:   . Active Member of Clubs or Organizations:   . Attends Archivist Meetings:   Marland Kitchen Marital Status:   Intimate Partner Violence:   . Fear of Current or Ex-Partner:   . Emotionally Abused:   Marland Kitchen Physically Abused:   . Sexually Abused:     Mr. Franssen family history includes Healthy in his mother; Heart attack in his father; Hypertension in his father; Leukemia in his brother.      Objective:    Vitals:   08/19/19 1335  BP: 120/80  Pulse: 94    Physical Exam Well-developed well-nourished  WM in no acute distress.  Height, [id number], BMI25.3  HEENT; nontraumatic normocephalic, EOMI, PE RR LA, sclera anicteric. Oropharynx; not examined Neck; supple, no JVD Cardiovascular; regular rate and rhythm with S1-S2, no murmur rub or  gallop Pulmonary; decreased breath sounds right base Abdomen; soft, nontender, nondistended, no palpable mass or hepatosplenomegaly, bowel sounds are active, no appreciable ascites or fluid wave Rectal; not done today Skin; benign exam, no jaundice rash or appreciable lesions Extremities; no clubbing cyanosis , trace edema in the ankles Neuro/Psych; alert and oriented x4, grossly nonfocal mood and affect appropriate, no asterixis       Assessment & Plan:   #34 44 year old white male with decompensated alcohol induced cirrhosis, complicated by refractory ascites, hepatic hydrothorax with associated hypoxia and fairly recent acute kidney injury. He is status post TIPS on 07/26/2019. At last office visit on 08/04/2019 he was  still complaining of dyspnea and repeat chest x-ray showed a persistent large right pleural effusion. Symptomatically today he is much improved, has been walking 3 or 4 blocks daily at home, has no complaints of residual ascites and no complaints of dyspnea today.  Energy level is good.  Hopefully he has had some resolution of the right pleural effusion.  #2 hepatic encephalopathy-stable, mentating very well.  Having numerous bowel movements per day on current dose of lactulose 15 cc 3 times daily.  Plan; repeat chest x-ray today, as he is currently asymptomatic and will think he needs a thoracentesis which had been in the process of getting scheduled over the past couple of weeks.  CBC with differential, c-Met, ammonia, pro time/INR. Continue Lasix 40 mg p.o. daily and Aldactone 100 mg p.o. daily.  After today's labs are reviewed will adjust diuretics accordingly. Continue lactulose but decrease to 15 cc p.o. twice daily, Continue complete alcohol abstinence Continue 2 g sodium diet. Will await findings of IR reevaluation next week.  We will discuss further with Dr. Havery Moros but I think he is close to being able to be released to return to work.  He says once he is  released medically he will have to have an agility test scheduled through his employer.  Marybel Alcott Genia Harold PA-C 08/19/2019   Cc: Azzie Glatter, FNP

## 2019-08-19 NOTE — Patient Instructions (Signed)
If you are age 46 or older, your body mass index should be between 23-30. Your Body mass index is 25.38 kg/m. If this is out of the aforementioned range listed, please consider follow up with your Primary Care Provider.  If you are age 58 or younger, your body mass index should be between 19-25. Your Body mass index is 25.38 kg/m. If this is out of the aformentioned range listed, please consider follow up with your Primary Care Provider.   We have sent the following medications to your pharmacy for you to pick up at your convenience: Lactulose, Lasic. Aldactone   Your provider has requested that you go to the basement level for lab work before leaving today. Press "B" on the elevator. The lab is located at the first door on the left as you exit the elevator.  Then head to the xray department on the basement level for a chest xray.  Continue a low salt diet,  Follow up with DR Havery Moros in 3-4 weeks  Due to recent changes in healthcare laws, you may see the results of your imaging and laboratory studies on MyChart before your provider has had a chance to review them.  We understand that in some cases there may be results that are confusing or concerning to you. Not all laboratory results come back in the same time frame and the provider may be waiting for multiple results in order to interpret others.  Please give [place] 48 hours in order for your provider to thoroughly review all the results before contacting the office for clarification of your results.

## 2019-08-21 NOTE — Progress Notes (Signed)
Agree with assessment and plan as outlined. CXR shows clear lung fields which is excellent news. Sounds like TIPS has worked quite well for him. Continue present meds. I'd like to see him back in the office in the next 2-3 months. Thanks

## 2019-08-22 ENCOUNTER — Other Ambulatory Visit: Payer: Self-pay

## 2019-08-22 ENCOUNTER — Telehealth: Payer: Self-pay

## 2019-08-22 ENCOUNTER — Other Ambulatory Visit: Payer: Self-pay | Admitting: Gastroenterology

## 2019-08-22 ENCOUNTER — Ambulatory Visit
Admission: RE | Admit: 2019-08-22 | Discharge: 2019-08-22 | Disposition: A | Discharge status: Home / Self Care | Payer: Self-pay | Source: Ambulatory Visit | Attending: Interventional Radiology | Admitting: Interventional Radiology

## 2019-08-22 DIAGNOSIS — K7031 Alcoholic cirrhosis of liver with ascites: Secondary | ICD-10-CM | POA: Insufficient documentation

## 2019-08-22 MED ORDER — SPIRONOLACTONE 100 MG PO TABS: 50.0000 mg | ORAL_TABLET | Freq: Every day | ORAL | 0 refills | Status: DC

## 2019-08-22 MED ORDER — FUROSEMIDE 40 MG PO TABS: 20.0000 mg | ORAL_TABLET | Freq: Every day | ORAL | 0 refills | Status: DC

## 2019-08-22 NOTE — Telephone Encounter (Signed)
Called and spoke to patient. He understands to decrease lasix to 69m once daily and aldactone to 59monce daily for one week and let [place] how he does. May discontinue at that time. He indicated that he has plenty of both medications (and a pill splitter) and does not need refills.   He would like to know when we will approve him to go back to work since once we do he will have to notify work and then get a physical scheduled before they will let him start driving again.  Patient would also like to know if he should continue lactulose. Please advise.   08-22-19: Thanks Jan. I just got a message from amy about this - please see result note from his recent labs. Will plan on decreasing lasix to 202m day for one week and aldactone to 53m64mday and if stable at that time we can stop both of them. If he reaccumulates fluid during this time he will need to let us [place] and will have to resume prior dosage. Thanks   08-22-19: Asked Dr. ArmbHavery Morospatient should stay on lasix 20 once daily as we received a refill request.  Patient had recent labs.

## 2019-08-22 NOTE — Telephone Encounter (Signed)
Dr. Havery Moros, are you still keeping him on lasix 40 mg once daily? Labs 08-19-19. Thanks

## 2019-08-22 NOTE — Telephone Encounter (Signed)
Thanks Jan. I just got a message from amy about this - please see result note from his recent labs. Will plan on decreasing lasix to 22m / day for one week and aldactone to 578m/ day and if stable at that time we can stop both of them. If he reaccumulates fluid during this time he will need to let [place] and will have to resume prior dosage. thanks

## 2019-08-22 NOTE — Telephone Encounter (Signed)
Thanks Jan, I received messages from Amy about this issue as well, want to make sure we are all on the same page. 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 He is becoming much more compensated following TIPS and doing better which is great. I think he has been on lactulose for prophylaxis following TIPS and don't recall him having overt encephalopathy which led to lactulose which is god. In this light given I don't think he has had overt encephalopathy from his liver disease I think he can start the process to follow up with his employer for resuming work if they clear him. thanks

## 2019-08-23 ENCOUNTER — Telehealth (INDEPENDENT_AMBULATORY_CARE_PROVIDER_SITE_OTHER): Payer: Self-pay | Admitting: Primary Care

## 2019-08-23 ENCOUNTER — Encounter: Payer: Self-pay | Admitting: *Deleted

## 2019-08-23 ENCOUNTER — Other Ambulatory Visit: Payer: Self-pay | Admitting: Gastroenterology

## 2019-08-23 ENCOUNTER — Ambulatory Visit
Admission: RE | Admit: 2019-08-23 | Discharge: 2019-08-23 | Disposition: A | Discharge status: Home / Self Care | Payer: Self-pay | Source: Ambulatory Visit | Attending: Student | Admitting: Student

## 2019-08-23 DIAGNOSIS — K7031 Alcoholic cirrhosis of liver with ascites: Secondary | ICD-10-CM

## 2019-08-23 DIAGNOSIS — Z7689 Persons encountering health services in other specified circumstances: Secondary | ICD-10-CM

## 2019-08-23 HISTORY — PX: IR RADIOLOGIST EVAL & MGMT: IMG5224

## 2019-08-23 NOTE — Telephone Encounter (Signed)
Patient notified to remain on lactulose

## 2019-08-23 NOTE — Telephone Encounter (Signed)
Dr. Havery Moros,  Please clarify when/if patient can discontinue lactulose.  He has been notified about the okay to return to work and will come pick up the note today.

## 2019-08-23 NOTE — Progress Notes (Signed)
Virtual Visit via Telephone Note  I connected with Shawn Martin on 08/23/19 at  9:30 AM EDT by telephone and verified that I am speaking with the correct person using two identifiers.   I discussed the limitations, risks, security and privacy concerns of performing an evaluation and management service by telephone and the availability of in person appointments. I also discussed with the patient that there may be a patient responsible charge related to this service. The patient expressed understanding and agreed to proceed.   History of Present Illness: Shawn Martin is having a virtual visit to establish care. He is followed by gastrology for decompensated HCV cirrhosis .He is very excited he was cleared approximally a hour ago to return to work. Past Medical History:  Diagnosis Date  . Celiac disease   . Cirrhosis ([place])   . Hepatic cirrhosis ([place])   . Hypertension    Current Outpatient Medications on File Prior to Visit  Medication Sig Dispense Refill  . cetirizine (ZYRTEC) 10 MG tablet Take 10 mg by mouth daily.    . ferrous sulfate 325 (65 FE) MG tablet Take 325 mg by mouth daily.    . furosemide (LASIX) 40 MG tablet Take 0.5 tablets (20 mg total) by mouth daily for 7 days.  0  . lactulose (CHRONULAC) 10 GM/15ML solution Take 15cc by mouth twice  daily t please call your GI doctor if you start to feel confused. 900 mL 1  . magnesium gluconate (MAGONATE) 500 MG tablet Take 500 mg by mouth daily as needed (cramps).    . Multiple Vitamin (MULTIVITAMIN WITH MINERALS) TABS tablet Take 1 tablet by mouth daily.    Marland Kitchen spironolactone (ALDACTONE) 100 MG tablet Take 0.5 tablets (50 mg total) by mouth daily for 7 days. 4 tablet 0   Current Facility-Administered Medications on File Prior to Visit  Medication Dose Route Frequency Provider Last Rate Last Admin  . 0.9 %  sodium chloride infusion   Intravenous Continuous Monia Sabal, PA-C       Observations/Objective: Review of  Systems  All other systems reviewed and are negative.  Assessment and Plan: Diagnoses and all orders for this visit:  Alcoholic cirrhosis of liver with ascites ([place]) Followed and manage by GI. He was able to tell me all medications he was on and the procession of decreasing very knowledgeable of his medication and care.  Encounter to establish care Juluis Mire, NP-C will be your  (PCP) she is mastered prepared . Able to diagnosed and treatment also  answer health concern as well as continuing care of varied medical conditions, not limited by cause, organ system, or diagnosis.    Follow Up Instructions:    I discussed the assessment and treatment plan with the patient. The patient was provided an opportunity to ask questions and all were answered. The patient agreed with the plan and demonstrated an understanding of the instructions.   The patient was advised to call back or seek an in-person evaluation if the symptoms worsen or if the condition fails to improve as anticipated.  I provided 22 minutes of non-face-to-face time during this encounter.  This includes reviewing previous records, labs, previous hospitalization and imaging   Kerin Perna, NP

## 2019-08-23 NOTE — Telephone Encounter (Signed)
I would continue it for now in the post TIPS setting, will reassess this at his next follow up with me if you can book him a follow up in 2-3 months. Thanks

## 2019-08-23 NOTE — Progress Notes (Signed)
Chief Complaint: Patient was consulted remotely today (TeleHealth) for follow up after TIPS.  History of Present Illness: Shawn Martin is a 44 y.o. male with a history of alcoholic cirrhosis, celiac disease, esophageal varices, portal hypertensive gastropathy, refractory ascites and symptomatic recurrent right hepatic hydrothorax who underwent TIPS placement on 07/26/2019.  The procedure was complicated by some respiratory failure likely on the basis of reexpansion pulmonary edema in the right lung after large volume thoracentesis during TIPS placement. He was discharged from the hospital on 07/29/2019.  Since discharge, Shawn Martin has made a remarkable recovery and states that he feels "a thousand times" better.  He has had no recurrent abdominal distention, shortness of breath or other symptoms.  He has been evaluated and has been cleared to go back to work next week.  Lasix dose has been decreased to 20 mg once a day and Aldactone to 50 mg once a day. He has decreased lactulose to once or twice a day and has had no confusion or memory loss.  Chest x-ray on 08/19/2019 demonstrated no evidence of pleural effusions and normal aeration of both lungs.  A TIPS duplex ultrasound on 08/22/2019 demonstrates a widely patent TIPS shunt with good flow.  No significant ascites is identified with some trace fluid adjacent to the liver.  Past Medical History:  Diagnosis Date  . Celiac disease   . Cirrhosis ([place])   . Hepatic cirrhosis ([place])   . Hypertension     Past Surgical History:  Procedure Laterality Date  . IR PARACENTESIS  02/07/2019  . IR PARACENTESIS  05/25/2019  . IR PARACENTESIS  06/10/2019  . IR PARACENTESIS  06/15/2019  . IR PARACENTESIS  06/22/2019  . IR PARACENTESIS  07/01/2019  . IR PARACENTESIS  07/12/2019  . IR PARACENTESIS  07/19/2019  . IR PARACENTESIS  07/26/2019  . IR RADIOLOGIST EVAL & MGMT  07/07/2019  . IR THORACENTESIS ASP PLEURAL SPACE W/IMG GUIDE  07/26/2019  . IR TIPS   07/26/2019  . IR TRANSCATHETER BX  06/22/2019  . IR US GUIDE VASC ACCESS RIGHT  06/22/2019  . IR VENOGRAM HEPATIC WO HEMODYNAMIC EVALUATION  06/22/2019  . NOSE SURGERY    . RADIOLOGY WITH ANESTHESIA N/A 07/26/2019   Procedure: TIPS;  Surgeon: Aletta Edouard, MD;  Location: [facility];  Service: Radiology;  Laterality: N/A;    Allergies: Gluten meal  Medications: Prior to Admission medications   Medication Sig Start Date End Date Taking? Authorizing Provider  cetirizine (ZYRTEC) 10 MG tablet Take 10 mg by mouth daily.    [provider]  ferrous sulfate 325 (65 FE) MG tablet Take 325 mg by mouth daily.    [provider]  furosemide (LASIX) 40 MG tablet Take 0.5 tablets (20 mg total) by mouth daily for 7 days. 08/22/19 08/29/19  [name], MD  lactulose (CHRONULAC) 10 GM/15ML solution Take 15cc by mouth twice  daily t please call your GI doctor if you start to feel confused. 08/19/19   Esterwood, Amy S, PA-C  magnesium gluconate (MAGONATE) 500 MG tablet Take 500 mg by mouth daily as needed (cramps).    [provider]  Multiple Vitamin (MULTIVITAMIN WITH MINERALS) TABS tablet Take 1 tablet by mouth daily.    [provider]  spironolactone (ALDACTONE) 100 MG tablet Take 0.5 tablets (50 mg total) by mouth daily for 7 days. 08/22/19 08/29/19  Armbruster, [name], MD     Family History  Problem Relation Age of Onset  .  Healthy Mother   . Hypertension Father   . Heart attack Father   . Leukemia Brother   . Colon cancer Neg Hx   . Stomach cancer Neg Hx   . Pancreatic cancer Neg Hx     Social History   Socioeconomic History  . Marital status: Married    Spouse name: Not on file  . Number of children: 2  . Years of education: Not on file  . Highest education level: Not on file  Occupational History  . Not on file  Tobacco Use  . Smoking status: Never Smoker  . Smokeless tobacco: Former Systems developer    Types: Chew  Substance and Sexual Activity  .  Alcohol use: Not Currently    Comment: every day drinker, 6 pack per day  . Drug use: Not Currently  . Sexual activity: Yes    Partners: Female  Other Topics Concern  . Not on file  Social History Narrative  . Not on file   Social Determinants of Health   Financial Resource Strain:   . Difficulty of Paying Living Expenses:   Food Insecurity:   . Worried About Charity fundraiser in the Last Year:   . Arboriculturist in the Last Year:   Transportation Needs:   . Film/video editor (Medical):   Marland Kitchen Lack of Transportation (Non-Medical):   Physical Activity:   . Days of Exercise per Week:   . Minutes of Exercise per Session:   Stress:   . Feeling of Stress :   Social Connections:   . Frequency of Communication with Friends and Family:   . Frequency of Social Gatherings with Friends and Family:   . Attends Religious Services:   . Active Member of Clubs or Organizations:   . Attends Archivist Meetings:   Marland Kitchen Marital Status:     Review of Systems  Constitutional: Negative.   HENT: Negative.   Respiratory: Negative.   Cardiovascular: Negative.   Gastrointestinal: Negative for abdominal distention, abdominal pain, blood in stool, constipation, nausea, rectal pain and vomiting.       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  Genitourinary: Negative.   Musculoskeletal: Negative.   Skin: Negative.   Neurological: Negative.   Hematological: Negative.     Review of Systems: A 12 point ROS discussed and pertinent positives are indicated in the HPI above.  All other systems are negative.  Physical Exam No direct physical exam was performed (except for noted visual exam findings with Video Visits).   Vital Signs: There were no vitals taken for this visit.  Imaging: CT ABDOMEN PELVIS W WO CONTRAST  Result Date: 07/26/2019 CLINICAL DATA:  Cirrhosis, refractory ascites and recent right thoracentesis in Emergency Department on 07/15/2019 with removal 5 L of fluid.  EXAM: CT ABDOMEN AND PELVIS WITHOUT AND WITH CONTRAST TECHNIQUE: Multidetector CT imaging of the abdomen and pelvis was performed following the standard protocol before and following the bolus administration of intravenous contrast. CONTRAST:  185m OMNIPAQUE IOHEXOL 350 MG/ML SOLN COMPARISON:  No prior CT studies. FINDINGS: Lower chest: Large right pleural effusion causes complete compressive atelectasis of the right middle lobe and right lower lobe. Fluid appears simple in density and measures the same density as ascites in the peritoneal cavity. 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 Hepatobiliary: Evidence of cirrhosis with shrunken and nodular liver. No masses or abnormal enhancement on arterial or venous phases  of imaging. Pancreas: Unremarkable. No pancreatic ductal dilatation or surrounding inflammatory changes. Spleen: Normal in size without focal abnormality. Adrenals/Urinary Tract: Adrenal glands are unremarkable. Kidneys are normal, without renal calculi, focal lesion, or hydronephrosis. Bladder is unremarkable. Stomach/Bowel: Bowel shows no evidence of obstruction, ileus or inflammation. No free air identified. No focal lesions identified by CT. Vascular/Lymphatic: Arterial structures showed no significant abnormalities. There is minimal calcified plaque in the abdominal aorta without aneurysm. Venous evaluation demonstrates normal patency of the portal vein without evidence of thrombus. Mesenteric veins and the splenic vein are patent. IVC, hepatic veins, renal veins and iliac veins demonstrate normal patency. No significant varices are identified by CT with probable very small varices present at the GE junction and level of distal esophagus. No enlarged lymph nodes are identified in the abdomen or pelvis. Reproductive: Prostate is unremarkable. Other: Small to moderate volume intraperitoneal ascites.  Musculoskeletal: No acute or significant osseous findings. IMPRESSION: 1. Evidence of cirrhosis with shrunken and nodular liver. No evidence of hepatocellular carcinoma by CT. 2. Large right pleural effusion causing complete compressive atelectasis of the right middle lobe and right lower lobe. Fluid appears similar in density to ascites in the peritoneal cavity. 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 Volume of ascites is small to moderate in the peritoneal cavity. 3. Probable very small varices at the GE junction and level of the distal esophagus. 4. The portal vein is open without evidence of thrombus and there is no contraindication anatomically to proceeding with a TIPS procedure. Electronically Signed   By: Aletta Edouard M.D.   On: 07/26/2019 08:39   DG Chest 2 View  Result Date: 08/19/2019 CLINICAL DATA:  Right-sided pleural effusion. EXAM: CHEST - 2 VIEW COMPARISON:  Aug 04, 2019 FINDINGS: The heart size is stable. The previously demonstrated right-sided pleural effusion is not visualized on this study. The lungs are essentially clear with minimal amount of atelectasis at the lung bases. A tips stent is noted. IMPRESSION: No active cardiopulmonary disease. Electronically Signed   By: Constance Holster M.D.   On: 08/19/2019 19:39   DG Chest 2 View  Result Date: 08/04/2019 CLINICAL DATA:  Hepatic hydrothorax. EXAM: CHEST - 2 VIEW COMPARISON:  07/29/2019 FINDINGS: There is a large right-sided pleural effusion which appears to decreased slightly since the prior study. There is no pneumothorax. No significant left-sided pleural effusion. The cardiac silhouette appears stable. Compressive atelectasis is noted at the right lung base. IMPRESSION: Persistent large right-sided pleural effusion which appears to be slightly decreased since prior study. Electronically Signed   By: Constance Holster M.D.   On:  08/04/2019 16:46   DG Chest 2 View  Result Date: 07/29/2019 CLINICAL DATA:  History of cirrhosis, chronic hyponatremia and recurrent ascites. Status post tips procedure with thoracentesis and paracentesis on April 27. EXAM: CHEST - 2 VIEW COMPARISON:  Chest x-ray dated 07/27/2019. FINDINGS: Persistent large opacity at the RIGHT lung base, but improved aeration at the RIGHT lung apex. Persistent RIGHT-sided pleural effusion, small to moderate in size. LEFT lung is clear. Heart size and mediastinal contours appear stable. IMPRESSION: 1. Persistent large airspace opacity at the RIGHT lung base, presumably combination of consolidation and pleural effusion, but improved aeration of the RIGHT upper lung. 2. Persistent RIGHT-sided pleural effusion, small to moderate in size. 3. LEFT lung is clear. Electronically Signed   By: Franki Cabot M.D.   On: 07/29/2019 10:06   IR  Tips  Result Date: 07/26/2019 CLINICAL DATA:  Cirrhosis, ascites and right-sided hepatic hydrothorax. The patient presents for a TIPS procedure. EXAM: 1. ULTRASOUND GUIDANCE FOR VASCULAR ACCESS OF THE PORTAL VEIN 2. TRANSJUGULAR INTRAHEPATIC PORTOSYSTEMIC SHUNT PLACEMENT MEDICATIONS: As antibiotic prophylaxis, 2 g IV Ancef was ordered pre-procedure and administered intravenously within one hour of incision. One unit of platelets was also administered IV during the procedure. ANESTHESIA/SEDATION: General anesthesia was administered by the Anesthesia Department. CONTRAST:  100 mL Omnipaque 300 FLUOROSCOPY TIME:  Fluoroscopy Time: 75 minutes.  1,891 mGy. COMPLICATIONS: None immediate. PROCEDURE: Informed written consent was obtained from the patient after a thorough discussion of the procedural risks, benefits and alternatives. All questions were addressed. Maximal Sterile Barrier Technique was utilized including caps, mask, sterile gowns, sterile gloves, sterile drape, hand hygiene and skin antiseptic. A timeout was performed prior to the initiation  of the procedure. Transhepatic portal vein access was performed under ultrasound guidance. Patency of the portal veins in the right lobe of the liver was confirmed by ultrasound. Under direct ultrasound guidance, a 21 gauge needle from an [place] set was advanced into a right portal vein branch. Ultrasound image documentation was performed. Contrast injection was performed through the needle after return of blood. A guidewire was advanced through the needle. The Accustick coaxial dilator was then advanced over the wire. A hemostatic adapter was applied to the outer 6 [place] dilator which was then utilized during the procedure for portal venography and as a target for portal vein access. Ultrasound was used to confirm patency of the right internal jugular vein. Under direct ultrasound guidance, access of the vein was performed with a micropuncture needle and micropuncture set. Ultrasound image documentation was performed. After access of the vein, venous access was dilated and a 10 French angled sheath advanced into the inferior vena cava under fluoroscopy over a guidewire. Portal venography was performed through the transhepatic portal venous access. Hepatic venography was performed through 5 French catheters advanced into right and middle hepatic veins. Several attempts were made to gain access into the portal venous system via both right and middle hepatic veins with a Col[place]into needle. Ultimately, a "gun-sight" technique was utilized by advancing a 10 mm snare out of the percutaneous transhepatic portal vein catheter into the right portal vein and additional snare via the 10 Fr TIPS sheath into the right hepatic vein. A 21 gauge, 15 cm Chiba needle was then advanced through the abdominal wall near the midline and obliquely through both snares under fluoroscopy. This allowed advancement of a 0.018 inch guidewire through the Spencer needle which was then snared by the hepatic vein snare and pulled back out of the  right jugular venous sheath. A coaxial catheter system consisting of an outer 0.035 inch diameter catheter and inter 0.014 inch diameter catheter was then advanced over the guidewire through the right jugular TIPS sheath. The inner catheter was removed. The outer catheter was then slowly retracted and contrast injected under fluoroscopy. Angiography was also performed through this catheter. A parallel guidewire was then advanced through the catheter and into the portal vein to establish portal vein access for TIPS placement. A 5 French marking pigtail catheter was advanced over a stiff guidewire into the portal vein and venography performed to assess appropriate length of covered stent for TIPS creation. Tract between the hepatic vein and right portal vein was dilated with a 6 mm x 4 cm Conquest balloon. The 10 French sheath was then advanced into the main portal vein. A  10 mm diameter Viatorr covered stent prosthesis with 6 cm covered stent length and 2 cm uncovered length was advanced through the sheath. The uncovered segment was positioned at the level of portal venous access. The prosthesis was then fully deployed. The covered stent was then post dilated with an 8 mm x 4 cm length balloon. Additional venography was then performed via the pigtail catheter readvanced into the portal vein. The pigtail catheter was removed. A slurry of Gel-Foam pledgets was made mixed with sterile saline. This was injected after retraction of the percutaneous portal access catheter out of the portal venous system. The jugular sheath was removed and hemostasis obtained with manual compression. FINDINGS: Both paracentesis and thoracentesis procedures were performed just prior to TIPS and are dictated separately. After obtaining jugular access, there was ability to catheterize both transversely oriented right and either additional right or middle hepatic veins. Access of the portal venous system could not be initially obtained with a  standard intrahepatic Colapinto needle puncture technique due to far anterior location of the right portal vein relative to the hepatic veins. A "gun-site" maneuver with snares was successful in obtaining portal access and allowing TIPS placement. During this maneuver, there was partial opacification hepatic arteries in the liver with injection the outer coaxial catheter. It did appear that this hepatic arterial puncture was most likely intrahepatic and not extrahepatic. After placement of the covered stent, there is excellent flow via the covered stent from the main portal vein into the attic vein and right atrium. No significant intrahepatic portal flow is identified. During the procedure, it was noted that despite near complete evacuation of right pleural fluid, there remained poor aeration of the right lung without evidence of pneumothorax. This is felt to be most likely a combination atelectasis and potentially post expansion pulmonary edema in the right lung. IMPRESSION: 1. Successful TIPS procedure with placement of a 10 mm Viatorr covered stent prosthesis (2 cm uncovered portal vein segment, 6 cm covered intrahepatic segment) extending from the main portal vein into the right hepatic vein with excellent flow present after stent placement. 2. Right thoracentesis and paracentesis were performed just prior to initiating the TIPS procedure to decrease the risk of intraperitoneal bleeding and also to try to improve ventilation under general anesthesia. As above, there appear to be poor aeration of the right lung despite near complete evacuation of right pleural fluid felt to be most likely a combination of atelectasis and potentially post expansion pulmonary edema. Patient will be admitted to the hospitalist service for observation. Outpatient follow-up with a TIPS ultrasound in 1 month. Electronically Signed   By: Aletta Edouard M.D.   On: 07/26/2019 15:09   DG Chest Port 1 View  Result Date:  07/27/2019 CLINICAL DATA:  Shortness of breath. Pneumonia. EXAM: PORTABLE CHEST 1 VIEW COMPARISON:  07/26/2019 FINDINGS: Increased right lung consolidation volume loss is seen, with air bronchograms. Probable layering right pleural effusion which is increased in size. Left lung is clear. Heart size is within normal limits. IMPRESSION: 1. Increased right lung consolidation and volume lossa. 2. Probable increased size of layering right pleural effusion. Electronically Signed   By: Marlaine Hind M.D.   On: 07/27/2019 10:07   DG CHEST PORT 1 VIEW  Result Date: 07/26/2019 CLINICAL DATA:  Acute respiratory failure, history cirrhosis, celiac disease, hypertension EXAM: PORTABLE CHEST 1 VIEW COMPARISON:  Portable exam 1451 hours compared to 07/15/2019 FINDINGS: Normal heart size mediastinal contours. Extensive BILATERAL airspace infiltrates RIGHT greater than LEFT with  air bronchograms in the mid to lower RIGHT lung. No pleural effusion or pneumothorax. Osseous structures unremarkable. Gaseous distention of stomach. IMPRESSION: Extensive airspace infiltrates in both lungs RIGHT much greater than LEFT, may represent multifocal pneumonia, asymmetric edema or ARDS. Electronically Signed   By: Lavonia Dana M.D.   On: 07/26/2019 16:20   US LIVER DOPPLER  Result Date: 08/22/2019 CLINICAL DATA:  Status post TIPS procedure on 07/26/2019 to treat ascites and right-sided hepatic hydrothorax secondary to cirrhosis and portal hypertension. EXAM: DUPLEX ULTRASOUND OF LIVER AND TIPS SHUNT TECHNIQUE: Color and duplex Doppler ultrasound was performed to evaluate the hepatic in-flow and out-flow vessels. COMPARISON:  Imaging from TIPS procedure on 07/26/2019 FINDINGS: Portal Vein Velocities Main:  48 cm/sec Right:  104 cm/sec Left:  18 cm/sec TIPS Stent Velocities Proximal:  165 cm/sec Mid:  307 Distal:  204 cm/sec TIPS shunt demonstrates widely patent flow. IVC: Present and patent with normal respiratory phasicity. Hepatic Vein  Velocities Right:  122 cm/sec Mid:  39 cm/sec Left:  15 cm/sec Splenic Vein: 16 Superior Mesenteric Vein: 54 Hepatic Artery: 182 Ascites: There may be some trace fluid adjacent to the liver on some of the images. No significant ascites is identified in the peritoneal cavity. Varices: Absent IMPRESSION: Widely patent TIPS shunt with good velocities throughout. No significant ascites identified in the peritoneal cavity. Electronically Signed   By: Aletta Edouard M.D.   On: 08/22/2019 11:28   IR Paracentesis  Result Date: 07/26/2019 INDICATION: History of alcoholic cirrhosis with refractory ascites; request made for therapeutic paracentesis EXAM: ULTRASOUND GUIDED PARACENTESIS MEDICATIONS: None COMPLICATIONS: None immediate. PROCEDURE: Informed written consent was obtained from the patient after a discussion of the risks, benefits and alternatives to treatment. A timeout was performed prior to the initiation of the procedure. Initial ultrasound scanning demonstrates a large amount of ascites within the right lower abdominal quadrant. The right lower abdomen was prepped and draped in the usual sterile fashion. Patient was intubated and sedated for TIPS procedure; no local anesthetic used. Following this, a 19 gauge, 7-cm, Yueh catheter was introduced. An ultrasound image was saved for documentation purposes. The paracentesis was performed. The catheter was removed and a dressing was applied. The patient tolerated the procedure well without immediate post procedural complication. FINDINGS: A total of approximately 2.4L of clear yellow fluid was removed. IMPRESSION: Successful ultrasound-guided paracentesis yielding 2.4 liters of peritoneal fluid. This was performed at the time of a TIPS procedure. Read by: Soyla Dryer, NP Electronically Signed   By: Aletta Edouard M.D.   On: 07/26/2019 13:35   IR THORACENTESIS ASP PLEURAL SPACE W/IMG GUIDE  Result Date: 07/26/2019 INDICATION: Patient with a history of  alcoholic cirrhosis; request for therapeutic thoracentesis for hepatic hydrothorax EXAM: ULTRASOUND GUIDED RIGHT THORACENTESIS MEDICATIONS: None. COMPLICATIONS: None immediate. PROCEDURE: An ultrasound guided thoracentesis was thoroughly discussed with the patient and questions answered. The benefits, risks, alternatives and complications were also discussed. The patient understands and wishes to proceed with the procedure. Written consent was obtained. Ultrasound was performed to localize and mark an adequate pocket of fluid in the right chest. The area was then prepped and draped in the normal sterile fashion. Under ultrasound guidance a 6 Fr Safe-T-Centesis catheter was introduced. Thoracentesis was performed. The catheter was removed and a dressing applied. Patient was intubated and sedated for a TIPS procedure, no local anesthetic used. FINDINGS: A total of approximately 4.6 L of clear yellow fluid was removed. IMPRESSION: Successful ultrasound guided right thoracentesis yielding 4.6L of  pleural fluid. The procedure immediately preceded a TIPS procedure. Read by: Soyla Dryer, NP Electronically Signed   By: Aletta Edouard M.D.   On: 07/26/2019 13:44    Labs:  CBC: Recent Labs    07/27/19 1506 07/29/19 0359 08/04/19 1432 08/19/19 1425  WBC 8.9 5.8 4.6 3.5*  HGB 10.0* 10.4* 12.6* 13.2  HCT 29.3* 30.2* 36.6* 38.5*  PLT 105* 102* 133.0* 128.0*    COAGS: Recent Labs    06/30/19 1420 07/01/19 0818 07/04/19 0235 07/19/19 1411 07/26/19 0632 08/19/19 1425  INR 1.2   < > 1.3* 1.1 1.1 1.2*  APTT 34  --   --   --   --   --    < > = values in this interval not displayed.    BMP: Recent Labs    07/19/19 1411 07/19/19 1411 07/26/19 0632 07/26/19 1111 07/27/19 0354 07/29/19 0359 08/04/19 1432 08/19/19 1425  NA 131*   < > 132*   < > 137 133* 134* 134*  K 4.6   < > 4.0   < > 4.3 3.7 3.8 3.7  CL 99   < > 100  --  106 105 101 97  CO2 25   < > 22  --  19* 23 27 31   GLUCOSE 113*   <  > 99  --  118* 107* 97 109*  BUN 10   < > 14  --  15 11 8 10   CALCIUM 8.5*   < > 8.5*  --  8.5* 7.8* 8.3* 9.4  CREATININE 1.14   < > 1.06  --  1.05 0.67 1.06 0.85  GFRNONAA >60  --  >60  --  >60 >60  --   --   GFRAA >60  --  >60  --  >60 >60  --   --    < > = values in this interval not displayed.    LIVER FUNCTION TESTS: Recent Labs    07/27/19 0354 07/29/19 0359 08/04/19 1432 08/19/19 1425  BILITOT 1.8* 1.6* 1.2 2.6*  AST 66* 76* 55* 54*  ALT 31 42 32 22  ALKPHOS 57 64 125* 194*  PROT 4.6* 4.2* 6.1 7.1  ALBUMIN 2.8* 2.3* 3.3* 3.6    TUMOR MARKERS: Recent Labs    01/28/19 1558  AFPTM 4.3    Assessment and Plan:  Shawn Martin has had a remarkable response after TIPS placement with resolution of the large volume symptomatic right-sided hepatic hydrothorax and essentially complete resolution of ascites with only a trace amount of fluid seen adjacent to the liver by ultrasound.  The TIPS shunt appears widely patent by duplex ultrasound.  I recommended a follow-up duplex ultrasound in 6 months.  He will continue to follow-up with [facility] and Dr. Havery Moros.   Electronically Signed: Azzie Roup 08/23/2019, 12:45 PM     I spent a total of 15 Minutes in remote  clinical consultation, greater than 50% of which was counseling/coordinating care post TIPS.    Visit type: Audio only (telephone). Audio (no video) only due to patient's lack of internet/smartphone capability. Alternative for in-person consultation at [facility], [address] [place], [place], [place]. This visit type was conducted due to national recommendations for restrictions regarding the COVID-19 Pandemic (e.g. social distancing).  This format is felt to be most appropriate for this patient at this time.  All issues noted in this document were discussed and addressed.

## 2019-08-29 ENCOUNTER — Encounter (INDEPENDENT_AMBULATORY_CARE_PROVIDER_SITE_OTHER): Payer: Self-pay | Admitting: Primary Care

## 2019-09-16 ENCOUNTER — Other Ambulatory Visit: Payer: Self-pay | Admitting: Gastroenterology

## 2019-09-29 ENCOUNTER — Ambulatory Visit: Payer: Self-pay | Admitting: Physician Assistant

## 2019-10-10 MED FILL — LACTULOSE 10 GM/15 ML SOLN: 10 | 30 days supply | Qty: 900 | Fill #1

## 2019-11-21 ENCOUNTER — Telehealth: Payer: Self-pay

## 2019-11-21 NOTE — Telephone Encounter (Signed)
Called pt back.  We scheduled him for Tuesday, 8-31 at 11:15am as he has a doctor's appt that day and will already be off work.

## 2019-11-21 NOTE — Telephone Encounter (Signed)
Called and spoke to pt.  He is due for 3rd Twinrix.  He was unable to schedule right now but will call back when he has time to look up when his other Dr's appt is when he will have to be off work anyway.

## 2019-11-21 NOTE — Telephone Encounter (Signed)
-----   Message from [place] sent at 06/24/2019 10:06 AM EDT ----- Needs 6 month standard Twinrix injection

## 2019-11-29 ENCOUNTER — Other Ambulatory Visit: Payer: Self-pay | Admitting: Nurse Practitioner

## 2019-11-29 ENCOUNTER — Ambulatory Visit (INDEPENDENT_AMBULATORY_CARE_PROVIDER_SITE_OTHER): Payer: BC Managed Care – PPO | Admitting: Gastroenterology

## 2019-11-29 DIAGNOSIS — Z95828 Presence of other vascular implants and grafts: Secondary | ICD-10-CM

## 2019-11-29 DIAGNOSIS — Z23 Encounter for immunization: Secondary | ICD-10-CM | POA: Diagnosis not present

## 2019-11-29 DIAGNOSIS — B1920 Unspecified viral hepatitis C without hepatic coma: Secondary | ICD-10-CM

## 2019-11-29 DIAGNOSIS — K7469 Other cirrhosis of liver: Secondary | ICD-10-CM

## 2019-12-08 ENCOUNTER — Encounter: Payer: Self-pay | Admitting: Gastroenterology

## 2019-12-08 ENCOUNTER — Ambulatory Visit (INDEPENDENT_AMBULATORY_CARE_PROVIDER_SITE_OTHER): Payer: BC Managed Care – PPO | Admitting: Gastroenterology

## 2019-12-08 ENCOUNTER — Ambulatory Visit
Admission: RE | Admit: 2019-12-08 | Discharge: 2019-12-08 | Disposition: A | Discharge status: Home / Self Care | Payer: BC Managed Care – PPO | Source: Ambulatory Visit | Attending: Nurse Practitioner | Admitting: Nurse Practitioner

## 2019-12-08 ENCOUNTER — Other Ambulatory Visit (INDEPENDENT_AMBULATORY_CARE_PROVIDER_SITE_OTHER): Payer: BC Managed Care – PPO

## 2019-12-08 VITALS — BP 134/80 | HR 100 | Ht 72.0 in | Wt 203.5 lb

## 2019-12-08 DIAGNOSIS — D509 Iron deficiency anemia, unspecified: Secondary | ICD-10-CM

## 2019-12-08 DIAGNOSIS — K7460 Unspecified cirrhosis of liver: Secondary | ICD-10-CM

## 2019-12-08 DIAGNOSIS — K900 Celiac disease: Secondary | ICD-10-CM | POA: Diagnosis not present

## 2019-12-08 DIAGNOSIS — I8500 Esophageal varices without bleeding: Secondary | ICD-10-CM

## 2019-12-08 DIAGNOSIS — Z1211 Encounter for screening for malignant neoplasm of colon: Secondary | ICD-10-CM

## 2019-12-08 DIAGNOSIS — R188 Other ascites: Secondary | ICD-10-CM

## 2019-12-08 DIAGNOSIS — K7469 Other cirrhosis of liver: Secondary | ICD-10-CM

## 2019-12-08 DIAGNOSIS — K7689 Other specified diseases of liver: Secondary | ICD-10-CM | POA: Diagnosis not present

## 2019-12-08 LAB — COMPREHENSIVE METABOLIC PANEL
ALT: 21 U/L (ref 0–53)
AST: 45 U/L — ABNORMAL HIGH (ref 0–37)
Albumin: 3.6 g/dL (ref 3.5–5.2)
Alkaline Phosphatase: 208 U/L — ABNORMAL HIGH (ref 39–117)
BUN: 7 mg/dL (ref 6–23)
CO2: 31 mEq/L (ref 19–32)
Calcium: 8.7 mg/dL (ref 8.4–10.5)
Chloride: 101 mEq/L (ref 96–112)
Creatinine, Ser: 1.01 mg/dL (ref 0.40–1.50)
GFR: 79.4 mL/min (ref >60.00)
Glucose, Bld: 88 mg/dL (ref 70–99)
Potassium: 3.5 mEq/L (ref 3.5–5.1)
Sodium: 139 mEq/L (ref 135–145)
Total Bilirubin: 1.5 mg/dL — ABNORMAL HIGH (ref 0.2–1.2)
Total Protein: 7.5 g/dL (ref 6.0–8.3)

## 2019-12-08 LAB — VITAMIN D 25 HYDROXY (VIT D DEFICIENCY, FRACTURES): VITD: 39.46 ng/mL (ref 30.00–100.00)

## 2019-12-08 MED ORDER — SUTAB 1479-225-188 MG PO TABS: 1.0000 | ORAL_TABLET | Freq: Once | ORAL | 0 refills | Status: AC

## 2019-12-08 NOTE — Patient Instructions (Addendum)
If you are age 46 or older, your body mass index should be between 23-30. Your Body mass index is 27.6 kg/m. If this is out of the aforementioned range listed, please consider follow up with your Primary Care Provider.  If you are age 80 or younger, your body mass index should be between 19-25. Your Body mass index is 27.6 kg/m. If this is out of the aformentioned range listed, please consider follow up with your Primary Care Provider.    You have been scheduled for a colonoscopy. Please follow written instructions given to you at your visit today.  Please pick up your prep supplies at the pharmacy within the next 1-3 days. If you use inhalers (even only as needed), please bring them with you on the day of your procedure.   You have been scheduled for a bone density test on Wednesday, 02-01-20 at 10:30am. Please arrive 15 minutes prior to your scheduled appointment to radiology on the basement floor of [place] location for this test. If you need to cancel or reschedule for any reason, please contact radiology at [phone].  Preparation for test is as follows:   If you are taking calcium, discontinue this 24-48 hours prior to your appointment.   Wear pants with an elastic waistband (or without any metal such as a zipper).   We do have gowns if you are unable to find appropriate clothing without metal.   Please bring a list of all current medications.    Please go to the lab in the basement of our building to have lab work done as you leave today. Hit "B" for basement when you get on the elevator.  When the doors open the lab is on your left.  We will call you with the results. Thank you.   Due to recent changes in healthcare laws, you may see the results of your imaging and laboratory studies on MyChart before your provider has had a chance to review them.  We understand that in some cases there may be results that are confusing or concerning to you. Not all  laboratory results come back in the same time frame and the provider may be waiting for multiple results in order to interpret others.  Please give [place] 48 hours in order for your provider to thoroughly review all the results before contacting the office for clarification of your results.    Thank you for entrusting me with your care and for choosing [facility], Dr. Wahneta Cellar

## 2019-12-08 NOTE — Progress Notes (Signed)
HPI :  44 year old male here for follow-up visit for cirrhosis.  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  He is also had a diagnosis of celiac disease during this evaluation as well, unclear if that played a role in his development of early liver disease.  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  He required diuretics and multiple paracentesis with quick recurrence of symptoms.  He had a TIPS on April 27.  His course was complicated by hypoxia and acute kidney injury.  Over time he has done really well and is now off all diuretics.  He has absolutely no ascites, no shortness of breath.  He is working full-time as a Administrator.  Denies any encephalopathy symptoms.  He has not been drinking any alcohol.  He has been 100% gluten-free and doing really well with it.  He denies any bowel issues.  No abdominal pains.  No blood in his stools.  He denies any family history of colon cancer.  He was noted during this course to have an iron deficiency anemia.  He has not yet had a colonoscopy given other more pressing issues during his course in recent months.  This is the best I have seen him since have started caring for him, he really feels well which is excellent.  He has been followed by the hepatology clinic in case he progressed and needed a transplant but they are monitoring for now.  He just recently had a liver ultrasound which shows a patent TIPS, presence of cirrhosis, no ascites.  Prior workup: EGD 02/08/19 -  - Grade II varices were found in the middle third of the esophagus and in the lower third of the esophagus. They were medium in size. No high risk stigmata for bleeding was noted. - The exam of the esophagus was otherwise normal. 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 - The exam  of the stomach was otherwise normal. - Biopsies were taken with a cold forceps in the gastric body, at the incisura and in the gastric antrum for Helicobacter pylori testing. - Diffuse mucosal flattening with some scalloping was found in the second portion of the duodenum, perhaps due to portal hypertension. Biopsies for histology were taken with a cold forceps for evaluation of celiac disease. - The exam of the duodenum was otherwise normal.  1. Surgical [P], duodenum - INCREASED INTRAEPITHELIAL LYMPHOCYTES AND MODERATE VILLOUS BLUNTING, SEE COMMENT. - NO DYSPLASIA OR MALIGNANCY. 2. Surgical [P], gastric antrum and gastric body - MILD REACTIVE GASTROPATHY. [place] IS NEGATIVE FOR HELICOBACTER PYLORI. - NO INTESTINAL METAPLASIA, DYSPLASIA, OR MALIGNANCY.   RUQ [place] 02/07/19 - IMPRESSION: 1. Cirrhosis. 2. Patent main portal vein with hepatopetal flow. 3. Ascites, increased since the prior ultrasound. 4. No gallstone. 5. Partially visualized right pleural effusion.  Echo 07/04/19 - EF 70-75%  Liver biopsy 06/22/19 FINAL MICROSCOPIC DIAGNOSIS:   A. 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 See comment    COMMENT:   The liver biopsy shows a nodular architecture with areas of fibrotic  parenchymal collapse. Trichrome stain highlights fibrous septa that  divide the parenchyma into small nodules and reticulin stain highlights  the regenerative nodularity. Septa contain all the expected biliary and  vascular structures, which are mostly unremarkable. A ductular reaction,  mild  in degree, is present at the periphery of the septa. A mild to  moderate mononuclear cell infiltrate composed principally of lymphocytes  is present in the septa with minimal to only focally mild interface  hepatitis. Increased plasma cells are not identified. Steatosis is not  seen but focal, possible mild ballooning degenerationappears to be  present.  Cholestasis is not seen. Megamitochondria, and other inclusions  are not seen in hepatocytes. Iron stain shows no stainable iron. PASD  stain is negative for diastase resistant inclusions in hepatocytes.   The findings are consistent with cirrhosis of uncertain etiology.  Patient's positive serology for ANA and ASMA is noted. Though not  diagnostic, the findings can be compatible with minimally to mildly  active autoimmune hepatitis. Differential diagnosis can include burnt  out steatohepatitis (alcoholic or nonalcoholic) and chronic viral  hepatitis. Clinical and serologic correlation is suggested.     RUQ [place] 12/08/19 - IMPRESSION: Coarsened increased hepatic echogenicity which can be seen with fatty infiltration and cirrhosis.  No focal hepatic mass identified.  Patent TIPS shunt.  Remainder of exam unremarkable.   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     Past Surgical History:  Procedure Laterality Date   IR PARACENTESIS  02/07/2019   IR PARACENTESIS  05/25/2019   IR PARACENTESIS  06/10/2019   IR PARACENTESIS  06/15/2019   IR PARACENTESIS  06/22/2019   IR PARACENTESIS  07/01/2019   IR PARACENTESIS  07/12/2019   IR PARACENTESIS  07/19/2019   IR PARACENTESIS  07/26/2019   IR RADIOLOGIST EVAL & MGMT  07/07/2019   IR RADIOLOGIST EVAL & MGMT  08/23/2019   IR THORACENTESIS ASP PLEURAL SPACE W/IMG GUIDE  07/26/2019   IR TIPS  07/26/2019   IR TRANSCATHETER BX  06/22/2019   IR US GUIDE VASC ACCESS RIGHT  06/22/2019   IR VENOGRAM HEPATIC WO HEMODYNAMIC EVALUATION  06/22/2019   NOSE SURGERY     RADIOLOGY WITH ANESTHESIA N/A 07/26/2019   Procedure: TIPS;  Surgeon: Aletta Edouard, MD;  Location: [facility];  Service: Radiology;  Laterality: N/A;   Family History  Problem Relation Age of Onset   Healthy Mother     Hypertension Father    Heart attack Father    Leukemia Brother    Colon cancer Neg Hx    Stomach cancer Neg Hx    Pancreatic cancer Neg Hx    Social History   Tobacco Use   Smoking status: Never Smoker   Smokeless tobacco: Former Systems developer    Types: Nurse, children's Use: Never used  Substance Use Topics   Alcohol use: Not Currently    Comment: every day drinker, 6 pack per day   Drug use: Not Currently   Current Outpatient Medications  Medication Sig Dispense Refill   cetirizine (ZYRTEC) 10 MG tablet Take 10 mg by mouth daily.     ferrous sulfate 325 (65 FE) MG tablet Take 325 mg by mouth daily.     lactulose (CHRONULAC) 10 GM/15ML solution Take 15cc by mouth twice  daily t please call your GI doctor if you start to feel confused. 900 mL 1   magnesium gluconate (MAGONATE) 500 MG tablet Take 500 mg by mouth daily as needed (cramps).     Multiple Vitamin (MULTIVITAMIN WITH MINERALS) TABS tablet Take 1 tablet by  mouth daily.     Zinc 10 MG LOZG Use as directed in the mouth or throat.     No current facility-administered medications for this visit.   Allergies  Allergen Reactions   Gluten Meal Diarrhea    bloating     Review of Systems: All systems reviewed and negative except where noted in HPI.    US Abdomen Limited RUQ  Result Date: 12/08/2019 CLINICAL DATA:  [place] cirrhosis, screening for hepatocellular carcinoma and evidence of portal hypertension; post tips procedure 07/26/2019 EXAM: ULTRASOUND ABDOMEN LIMITED RIGHT UPPER QUADRANT COMPARISON:  CT abdomen and pelvis 07/25/2019 FINDINGS: Gallbladder: Normally distended without stones or wall thickening. No pericholecystic fluid or sonographic Murphy sign. Common bile duct: Diameter: 1.3 mm, normal Liver: Coarsened increased hepatic echogenicity. Fairly smooth hepatic contours. No definite hepatic mass or nodularity. Portal vein is patent on color Doppler imaging with normal direction of blood flow  towards the liver. TIPS shunt identified, patent with evidence of f turbulent orward flow. Other: No perihepatic varices.  No RIGHT upper quadrant free fluid. IMPRESSION: Coarsened increased hepatic echogenicity which can be seen with fatty infiltration and cirrhosis. No focal hepatic mass identified. Patent TIPS shunt. Remainder of exam unremarkable. Electronically Signed   By: Lavonia Dana M.D.   On: 12/08/2019 15:01    Lab Results  Component Value Date   WBC 3.5 (L) 08/19/2019   HGB 13.2 08/19/2019   HCT 38.5 (L) 08/19/2019   MCV 96.4 08/19/2019   PLT 128.0 (L) 08/19/2019    Lab Results  Component Value Date   IRON 57 07/01/2019   TIBC 322 07/01/2019   FERRITIN 173 06/09/2019     Physical Exam: BP 134/80    Pulse 100    Ht 6' (1.829 m)    Wt 203 lb 8 oz (92.3 kg)    BMI 27.60 kg/m  Constitutional: Pleasant,well-developed, male in no acute distress. HEENT: Normocephalic and atraumatic. Conjunctivae are normal. No scleral icterus. Neck supple.  Cardiovascular: Normal rate, regular rhythm.  Pulmonary/chest: Effort normal and breath sounds normal.  Abdominal: Soft, nondistended, nontender.  There are no masses palpable.  Extremities: no edema Lymphadenopathy: No cervical adenopathy noted. Neurological: Alert and oriented to person place and time. No asterixis. Skin: Skin is warm and dry. No rashes noted. Psychiatric: Normal mood and affect. Behavior is normal.   ASSESSMENT AND PLAN: 44 year old male here for reassessment of the following:  Cirrhosis with ascites / esophageal varices - history as outlined above, suspect cirrhosis most likely due to alcohol, work-up for autoimmune hepatitis was nondiagnostic and hepatology did not want to treat given his liver enzymes were not that elevated.  Also with diagnosis of celiac disease and unclear if that played a role in his presentation as well.  He progressed fairly quickly in need of recurrent paracentesis to manage his ascites,  ultimately led to TIPS and he has dramatically improved.  Off all diuretics.  He has no evidence of encephalopathy, he is not on any lactulose.  He had a history of varices in the past and we could not treat with beta-blocker due to his ascites.  Fortunately TIPS has hopefully treated that as well.  His [place] screening is up-to-date, his TIPS is patent.  We will repeat his labs and kidney function to ensure stable today, perform AFP for [facility] screening.  He is plugged in with hepatology, doing real well, needs to abstain from alcohol.  We will continue to see him every 6 months.  Celiac disease -  I counseled him on this, he is doing really good job with a gluten-free diet.  Going to check his TTG level again to see if that has improved.  We will also check baseline vitamin D level, and also refer him for a DEXA scan given this diagnosis to ensure no osteopenia or osteoporosis.  He will continue gluten-free diet  Iron deficiency anemia / colon cancer screening -previously had iron deficiency, had not had colonoscopy yet due to his other medical problems, now stable to complete colonoscopy and perform his screening.  I discussed risk benefits, he wants to proceed.  Further recommendations pending the results.  EGD findings as above.  Possible that celiac disease and portal hypertensive gastritis led to iron deficiency, but will ensure no colonic pathology.  Marks Cellar, MD [facility]

## 2019-12-09 LAB — TISSUE TRANSGLUTAMINASE, IGA: (tTG) Ab, IgA: 2 U/mL

## 2019-12-09 LAB — AFP TUMOR MARKER: AFP-Tumor Marker: 3.1 ng/mL (ref <6.1)

## 2019-12-15 ENCOUNTER — Other Ambulatory Visit: Payer: Self-pay | Admitting: Physician Assistant

## 2019-12-15 MED FILL — LACTULOSE 10 GM/15 ML SOLN: 10 | 30 days supply | Qty: 900 | Fill #0

## 2020-01-04 DIAGNOSIS — Z1152 Encounter for screening for COVID-19: Secondary | ICD-10-CM | POA: Diagnosis not present

## 2020-01-04 DIAGNOSIS — Z03818 Encounter for observation for suspected exposure to other biological agents ruled out: Secondary | ICD-10-CM | POA: Diagnosis not present

## 2020-01-06 ENCOUNTER — Ambulatory Visit
Admission: EM | Admit: 2020-01-06 | Discharge: 2020-01-06 | Disposition: A | Discharge status: Home / Self Care | Payer: BC Managed Care – PPO

## 2020-01-06 DIAGNOSIS — J069 Acute upper respiratory infection, unspecified: Secondary | ICD-10-CM | POA: Diagnosis not present

## 2020-01-06 NOTE — Discharge Instructions (Signed)
Continue taking Mucinex as needed for congestion.  Also consider using saline nasal spray.    Follow up with your primary care provider if your symptoms are not improving.

## 2020-01-06 NOTE — ED Triage Notes (Signed)
Pt reports having sinus pressure that began on Wednesday. Also reports having runny nose and congestion. Neg covid yesterday. Has been taking mucinex without relief. Pt sts he has celiac and cirrhosis, unsure what meds he can take.

## 2020-01-06 NOTE — ED Provider Notes (Signed)
[name]    CSN: [id number] Arrival date & time: 01/06/20  4128      History   Chief Complaint Chief Complaint  Patient presents with   Sinus Problem    HPI Shawn Martin is a 44 y.o. male.   Patient presents with 2-day history of sinus congestion, runny nose, and sinus pressure.  He also reports a mild sore throat and nonproductive cough.  He denies fever, chills, ear pain, shortness of breath, vomiting, diarrhea, or other symptoms.  Treatment attempted at home with Mucinex.  Patient had a negative COVID test yesterday.  His medical history includes celiac disease, cirrhosis, hypertension, acute respiratory failure, acute kidney injury, alcohol dependence, ascites due to alcoholic cirrhosis.   The history is provided by the patient.    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    Patient Active Problem List   Diagnosis Date Noted   Cirrhosis ([place]) 07/26/2019   Acute respiratory failure with hypoxia ([place]) 07/26/2019   Hyperkalemia 06/30/2019   AKI (acute kidney injury) ([place]) 06/30/2019   Hyponatremia 06/03/2019   Alcohol dependence ([place]) 06/03/2019   Symptomatic anemia 06/03/2019   Ascites due to alcoholic cirrhosis ([place]) 78/67/6720   Alcoholic cirrhosis of liver with ascites ([place]) 01/28/2019   Screening for viral disease 01/28/2019    Past Surgical History:  Procedure Laterality Date   IR PARACENTESIS  02/07/2019   IR PARACENTESIS  05/25/2019   IR PARACENTESIS  06/10/2019   IR PARACENTESIS  06/15/2019   IR PARACENTESIS  06/22/2019   IR PARACENTESIS  07/01/2019   IR PARACENTESIS  07/12/2019   IR PARACENTESIS  07/19/2019   IR PARACENTESIS  07/26/2019   IR RADIOLOGIST EVAL & MGMT  07/07/2019   IR RADIOLOGIST EVAL & MGMT  08/23/2019   IR THORACENTESIS ASP PLEURAL SPACE W/IMG GUIDE   07/26/2019   IR TIPS  07/26/2019   IR TRANSCATHETER BX  06/22/2019   IR US GUIDE VASC ACCESS RIGHT  06/22/2019   IR VENOGRAM HEPATIC WO HEMODYNAMIC EVALUATION  06/22/2019   NOSE SURGERY     RADIOLOGY WITH ANESTHESIA N/A 07/26/2019   Procedure: TIPS;  Surgeon: Aletta Edouard, MD;  Location: [facility];  Service: Radiology;  Laterality: N/A;       Home Medications    Prior to Admission medications   Medication Sig Start Date End Date Taking? Authorizing Provider  cetirizine (ZYRTEC) 10 MG tablet Take 10 mg by mouth daily.    [provider]  ferrous sulfate 325 (65 FE) MG tablet Take 325 mg by mouth daily.    [provider]  lactulose, encephalopathy, (CHRONULAC) 10 GM/15ML SOLN TAKE 15ML BY MOUTH TWICE DAILY PLEASE CALL YOUR GI DOCTOR IF YOU START TO FEEL CONFUSED. 12/15/19   Esterwood, Amy S, PA-C  magnesium gluconate (MAGONATE) 500 MG tablet Take 500 mg by mouth daily as needed (cramps).    [provider]  Multiple Vitamin (MULTIVITAMIN WITH MINERALS) TABS tablet Take 1 tablet by mouth daily.    [provider]  Zinc 10 MG LOZG Use as directed in the mouth or throat.    [provider]    Family History Family History  Problem Relation Age of Onset   Healthy Mother    Hypertension Father    Heart attack Father    Leukemia Brother  Colon cancer Neg Hx    Stomach cancer Neg Hx    Pancreatic cancer Neg Hx     Social History Social History   Tobacco Use   Smoking status: Former Smoker   Smokeless tobacco: Former Systems developer    Types: Chew    Quit date: 03/2017  Vaping Use   Vaping Use: Never used  Substance Use Topics   Alcohol use: Not Currently    Comment: every day drinker, 6 pack per day   Drug use: Not Currently     Allergies   Gluten meal   Review of Systems Review of Systems  Constitutional: Negative for chills and fever.  HENT: Positive for congestion, rhinorrhea, sinus pressure and sore throat.  Negative for ear pain.   Eyes: Negative for pain and visual disturbance.  Respiratory: Positive for cough. Negative for shortness of breath.   Cardiovascular: Negative for chest pain and palpitations.  Gastrointestinal: Negative for abdominal pain, diarrhea, nausea and vomiting.  Genitourinary: Negative for dysuria and hematuria.  Musculoskeletal: Negative for arthralgias and back pain.  Skin: Negative for color change and rash.  Neurological: Negative for seizures and syncope.  All other systems reviewed and are negative.    Physical Exam Triage Vital Signs ED Triage Vitals  Enc Vitals Group     BP 01/06/20 0914 (!) 163/91     Pulse --      Resp 01/06/20 0914 16     Temp 01/06/20 0914 98.4 F (36.9 C)     Temp Source 01/06/20 0914 Oral     SpO2 01/06/20 0914 98 %     Weight 01/06/20 0915 205 lb (93 kg)     Height 01/06/20 0915 6' (1.829 m)     Head Circumference --      Peak Flow --      Pain Score 01/06/20 0915 0     Pain Loc --      Pain Edu? --      Excl. in [place]? --    No data found.  Updated Vital Signs BP (!) 163/91    Temp 98.4 F (36.9 C) (Oral)    Resp 16    Ht 6' (1.829 m)    Wt 205 lb (93 kg)    SpO2 98%    BMI 27.80 kg/m   Visual Acuity Right Eye Distance:   Left Eye Distance:   Bilateral Distance:    Right Eye Near:   Left Eye Near:    Bilateral Near:     Physical Exam Vitals and nursing note reviewed.  Constitutional:      General: He is not in acute distress.    Appearance: He is well-developed.  HENT:     Head: Normocephalic and atraumatic.     Right Ear: Tympanic membrane normal.     Left Ear: Tympanic membrane normal.     Nose: Nose normal.     Mouth/Throat:     Mouth: Mucous membranes are moist.     Pharynx: Oropharynx is clear.  Eyes:     Conjunctiva/sclera: Conjunctivae normal.  Cardiovascular:     Rate and Rhythm: Normal rate and regular rhythm.     Heart sounds: No murmur heard.   Pulmonary:     Effort: Pulmonary effort is  normal. No respiratory distress.     Breath sounds: Normal breath sounds.  Abdominal:     Palpations: Abdomen is soft.     Tenderness: There is no abdominal tenderness. There is no guarding or rebound.  Musculoskeletal:     Cervical back: Neck supple.  Skin:    General: Skin is warm and dry.     Findings: No rash.  Neurological:     General: No focal deficit present.     Mental Status: He is alert and oriented to person, place, and time.     Gait: Gait normal.  Psychiatric:        Mood and Affect: Mood normal.        Behavior: Behavior normal.      UC Treatments / Results  Labs (all labs ordered are listed, but only abnormal results are displayed) Labs Reviewed - No data to display  EKG   Radiology No results found.  Procedures Procedures (including critical care time)  Medications Ordered in UC Medications - No data to display  Initial Impression / Assessment and Plan / UC Course  I have reviewed the triage vital signs and the nursing notes.  Pertinent labs & imaging results that were available during my care of the patient were reviewed by me and considered in my medical decision making (see chart for details).   URI.  Work note provided per patient request.  Instructed him to continue using Mucinex as needed for congestion and consider adding an OTC nasal saline spray.  Instructed him to follow-up with his PCP if his symptoms are not improving.  Patient agrees to plan of care.   Final Clinical Impressions(s) / UC Diagnoses   Final diagnoses:  Upper respiratory tract infection, unspecified type     Discharge Instructions     Continue taking Mucinex as needed for congestion.  Also consider using saline nasal spray.    Follow up with your primary care provider if your symptoms are not improving.       ED Prescriptions    None     PDMP not reviewed this encounter.   Sharion Balloon, NP 01/06/20 [id number]

## 2020-01-18 ENCOUNTER — Other Ambulatory Visit: Payer: Self-pay | Admitting: Interventional Radiology

## 2020-01-18 DIAGNOSIS — K900 Celiac disease: Secondary | ICD-10-CM

## 2020-01-18 DIAGNOSIS — Z1211 Encounter for screening for malignant neoplasm of colon: Secondary | ICD-10-CM

## 2020-01-18 DIAGNOSIS — D509 Iron deficiency anemia, unspecified: Secondary | ICD-10-CM

## 2020-01-18 DIAGNOSIS — I8500 Esophageal varices without bleeding: Secondary | ICD-10-CM

## 2020-01-18 DIAGNOSIS — R188 Other ascites: Secondary | ICD-10-CM

## 2020-02-01 ENCOUNTER — Ambulatory Visit (INDEPENDENT_AMBULATORY_CARE_PROVIDER_SITE_OTHER): Payer: BC Managed Care – PPO

## 2020-02-01 ENCOUNTER — Other Ambulatory Visit: Payer: Self-pay | Admitting: Gastroenterology

## 2020-02-01 ENCOUNTER — Telehealth: Payer: Self-pay | Admitting: Gastroenterology

## 2020-02-01 ENCOUNTER — Ambulatory Visit: Payer: BC Managed Care – PPO

## 2020-02-01 DIAGNOSIS — Z1159 Encounter for screening for other viral diseases: Secondary | ICD-10-CM

## 2020-02-01 LAB — SARS CORONAVIRUS 2 (TAT 6-24 HRS): SARS Coronavirus 2: NEGATIVE

## 2020-02-01 NOTE — Telephone Encounter (Signed)
I have spoken to patient to advise that his prep was sent to [facility] in [place] in [place], [place] as per his after visit summary given and explained to him at his visit in September. I also advised that if he has not already picked up his prep, he may want to call the pharmacy to ask that his prescription be filled as they have probably already put his prescription back on the shelf. He verbalizes understanding.

## 2020-02-02 ENCOUNTER — Telehealth: Payer: Self-pay | Admitting: Gastroenterology

## 2020-02-02 NOTE — Telephone Encounter (Signed)
Spoke with patient in regards to his concerns, advised patient to follow prep instructions from here on out and only clear liquids for the remainder of the day and to begin prep this evening.   Pt states that he needs to reschedule DEXA scan, provided patient with with [facility] number so that he can get rescheduled.   Patient verbalized understanding of all information and had no concerns at the end of the call

## 2020-02-02 NOTE — Telephone Encounter (Signed)
I have contacted pharmacy who has reran script with the coupon we had on the script which was not originally ran by them. Rx is now going through at $50. Pharmacy indicates that they have patient on the other line and will advise patient.

## 2020-02-02 NOTE — Telephone Encounter (Signed)
Pt is scheduled for colon tomorrow. He just tried to pick up prep but his insurance is not covering it and out-of-pocket price is over $150. Pt wants to know if there is another alternative. Pls call him.

## 2020-02-02 NOTE — Telephone Encounter (Signed)
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

## 2020-02-03 ENCOUNTER — Encounter: Payer: Self-pay | Admitting: Gastroenterology

## 2020-02-03 ENCOUNTER — Other Ambulatory Visit: Payer: Self-pay

## 2020-02-03 ENCOUNTER — Ambulatory Visit (AMBULATORY_SURGERY_CENTER): Payer: BC Managed Care – PPO | Admitting: Gastroenterology

## 2020-02-03 VITALS — BP 144/79 | HR 79 | Temp 98.1°F | Resp 15 | Ht 72.0 in | Wt 203.0 lb

## 2020-02-03 DIAGNOSIS — D127 Benign neoplasm of rectosigmoid junction: Secondary | ICD-10-CM | POA: Diagnosis not present

## 2020-02-03 DIAGNOSIS — Z1211 Encounter for screening for malignant neoplasm of colon: Secondary | ICD-10-CM

## 2020-02-03 DIAGNOSIS — D128 Benign neoplasm of rectum: Secondary | ICD-10-CM | POA: Diagnosis not present

## 2020-02-03 DIAGNOSIS — D125 Benign neoplasm of sigmoid colon: Secondary | ICD-10-CM | POA: Diagnosis not present

## 2020-02-03 DIAGNOSIS — D122 Benign neoplasm of ascending colon: Secondary | ICD-10-CM

## 2020-02-03 MED ORDER — SODIUM CHLORIDE 0.9 % IV SOLN: 500.0000 mL | Freq: Once | INTRAVENOUS | Status: DC

## 2020-02-03 NOTE — Patient Instructions (Signed)
Handouts provided on polyps and hemorrhoids.   YOU HAD AN ENDOSCOPIC PROCEDURE TODAY AT [facility]:   Refer to the procedure report that was given to you for any specific questions about what was found during the examination.  If the procedure report does not answer your questions, please call your gastroenterologist to clarify.  If you requested that your care partner not be given the details of your procedure findings, then the procedure report has been included in a sealed envelope for you to review at your convenience later.  YOU SHOULD EXPECT: Some feelings of bloating in the abdomen. Passage of more gas than usual.  Walking can help get rid of the air that was put into your GI tract during the procedure and reduce the bloating. If you had a lower endoscopy (such as a colonoscopy or flexible sigmoidoscopy) you may notice spotting of blood in your stool or on the toilet paper. If you underwent a bowel prep for your procedure, you may not have a normal bowel movement for a few days.  Please Note:  You might notice some irritation and congestion in your nose or some drainage.  This is from the oxygen used during your procedure.  There is no need for concern and it should clear up in a day or so.  SYMPTOMS TO REPORT IMMEDIATELY:   Following lower endoscopy (colonoscopy or flexible sigmoidoscopy):  Excessive amounts of blood in the stool  Significant tenderness or worsening of abdominal pains  Swelling of the abdomen that is new, acute  Fever of 100F or higher  For urgent or emergent issues, a gastroenterologist can be reached at any hour by calling [phone]. Do not use MyChart messaging for urgent concerns.    DIET:  We do recommend a small meal at first, but then you may proceed to your regular diet.  Drink plenty of fluids but you should avoid alcoholic beverages for 24 hours.  ACTIVITY:  You should plan to take it easy for the rest of today and you should NOT DRIVE  or use heavy machinery until tomorrow (because of the sedation medicines used during the test).    FOLLOW UP: Our staff will call the number listed on your records 48-72 hours following your procedure to check on you and address any questions or concerns that you may have regarding the information given to you following your procedure. If we do not reach you, we will leave a message.  We will attempt to reach you two times.  During this call, we will ask if you have developed any symptoms of COVID 19. If you develop any symptoms (ie: fever, flu-like symptoms, shortness of breath, cough etc.) before then, please call [phone].  If you test positive for Covid 19 in the 2 weeks post procedure, please call and report this information to [place].    If any biopsies were taken you will be contacted by phone or by letter within the next 1-3 weeks.  Please call us at [phone] if you have not heard about the biopsies in 3 weeks.    SIGNATURES/CONFIDENTIALITY: You and/or your care partner have signed paperwork which will be entered into your electronic medical record.  These signatures attest to the fact that that the information above on your After Visit Summary has been reviewed and is understood.  Full responsibility of the confidentiality of this discharge information lies with you and/or your care-partner.

## 2020-02-03 NOTE — Progress Notes (Signed)
Called to room to assist during endoscopic procedure.  Patient ID and intended procedure confirmed with present staff. Received instructions for my participation in the procedure from the performing physician.  

## 2020-02-03 NOTE — Op Note (Signed)
Shawn Martin: Shawn Martin Procedure Date: 02/03/2020 3:33 PM MRN: [id number] Endoscopist: Remo Lipps P. Havery Moros , MD Age: 44 Referring MD:  Date of Birth: May 04, 1975 Gender: Male Account #: [id number] Procedure:                Colonoscopy Indications:              Screening for colorectal malignant neoplasm, This                            is the patient's first colonoscopy Medicines:                Monitored Anesthesia Care Procedure:                Pre-Anesthesia Assessment:                           - Prior to the procedure, a History and Physical                            was performed, and patient medications and                            allergies were reviewed. The patient's tolerance of                            previous anesthesia was also reviewed. The risks                            and benefits of the procedure and the sedation                            options and risks were discussed with the patient.                            All questions were answered, and informed consent                            was obtained. Prior Anticoagulants: The patient has                            taken no previous anticoagulant or antiplatelet                            agents. ASA Grade Assessment: III - A patient with                            severe systemic disease. After reviewing the risks                            and benefits, the patient was deemed in                            satisfactory condition to undergo the procedure.  After obtaining informed consent, the colonoscope                            was passed under direct vision. Throughout the                            procedure, the patient's blood pressure, pulse, and                            oxygen saturations were monitored continuously. The                            Colonoscope was introduced through the anus and                            advanced to the the  terminal ileum, with                            identification of the appendiceal orifice and IC                            valve. The colonoscopy was performed without                            difficulty. The patient tolerated the procedure                            well. The quality of the bowel preparation was                            good. The terminal ileum, ileocecal valve,                            appendiceal orifice, and rectum were photographed. Scope In: 3:55:42 PM Scope Out: 4:15:35 PM Scope Withdrawal Time: 0 hours 15 minutes 55 seconds  Total Procedure Duration: 0 hours 19 minutes 53 seconds  Findings:                 The perianal and digital rectal examinations were                            normal.                           The terminal ileum appeared normal.                           Two sessile polyps were found in the ascending                            colon. The polyps were 3 to 7 mm in size. These                            polyps were removed with a cold snare. Resection  and retrieval were complete.                           A 8 mm polyp was found in the sigmoid colon. The                            polyp was sessile. The polyp was removed with a                            cold snare. Resection and retrieval were complete.                           A 4 mm polyp was found in the sigmoid colon. The                            polyp was sessile. The polyp was removed with a                            cold snare. Resection and retrieval were complete.                           Three sessile polyps were found in the rectum. The                            polyps were 2 to 3 mm in size. These polyps were                            removed with a cold snare. Resection and retrieval                            were complete.                           Internal hemorrhoids were found during                            retroflexion. The  hemorrhoids were small.                           The exam was otherwise without abnormality. Complications:            No immediate complications. Estimated blood loss:                            Minimal. Estimated Blood Loss:     Estimated blood loss was minimal. Impression:               - The examined portion of the ileum was normal.                           - Two 3 to 7 mm polyps in the ascending colon,                            removed with a  cold snare. Resected and retrieved.                           - One 8 mm polyp in the sigmoid colon, removed with                            a cold snare. Resected and retrieved.                           - One 4 mm polyp in the sigmoid colon, removed with                            a cold snare. Resected and retrieved.                           - Three 2 to 3 mm polyps in the rectum, removed                            with a cold snare. Resected and retrieved.                           - Internal hemorrhoids.                           - The examination was otherwise normal. Recommendation:           - Patient has a contact number available for                            emergencies. The signs and symptoms of potential                            delayed complications were discussed with the                            patient. Return to normal activities tomorrow.                            Written discharge instructions were provided to the                            patient.                           - Resume previous diet.                           - Continue present medications.                           - Await pathology results. Remo Lipps P. Maikol Grassia, MD 02/03/2020 4:20:56 PM This report has been signed electronically.

## 2020-02-03 NOTE — Progress Notes (Signed)
VS by CW. ?

## 2020-02-03 NOTE — Progress Notes (Signed)
pt tolerated well. VSS. awake and to recovery. Report given to RN.  

## 2020-02-07 ENCOUNTER — Telehealth: Payer: Self-pay | Admitting: *Deleted

## 2020-02-07 NOTE — Telephone Encounter (Signed)
°  Follow up Call-  Call back number 02/03/2020 06/22/2019 02/08/2019  Post procedure Call Back phone  # [phone]  Permission to leave phone message Yes Yes Yes     Patient questions:  Do you have a fever, pain , or abdominal swelling? No. Pain Score  0 *  Have you tolerated food without any problems? Yes.    Have you been able to return to your normal activities? Yes.    Do you have any questions about your discharge instructions: Diet   No. Medications  No. Follow up visit  No.  Do you have questions or concerns about your Care? No.  Actions: * If pain score is 4 or above: No action needed, pain <4.  Have you developed a fever since your procedure?   2.   Have you had an respiratory symptoms (SOB or cough) since your procedure? no  3.   Have you tested positive for COVID 19 since your procedure no  4.   Have you had any family members/close contacts diagnosed with the COVID 19 since your procedure? no   If yes to any of these questions please route to Joylene John, RN and Joella Prince, RN

## 2020-02-14 ENCOUNTER — Ambulatory Visit
Admission: RE | Admit: 2020-02-14 | Discharge: 2020-02-14 | Disposition: A | Discharge status: Home / Self Care | Payer: BC Managed Care – PPO | Source: Ambulatory Visit | Attending: Interventional Radiology | Admitting: Interventional Radiology

## 2020-02-14 DIAGNOSIS — D509 Iron deficiency anemia, unspecified: Secondary | ICD-10-CM

## 2020-02-14 DIAGNOSIS — I8500 Esophageal varices without bleeding: Secondary | ICD-10-CM | POA: Diagnosis not present

## 2020-02-14 DIAGNOSIS — K900 Celiac disease: Secondary | ICD-10-CM

## 2020-02-14 DIAGNOSIS — Z1211 Encounter for screening for malignant neoplasm of colon: Secondary | ICD-10-CM

## 2020-02-14 DIAGNOSIS — R188 Other ascites: Secondary | ICD-10-CM

## 2020-02-14 DIAGNOSIS — K7460 Unspecified cirrhosis of liver: Secondary | ICD-10-CM | POA: Diagnosis not present

## 2020-02-14 DIAGNOSIS — J948 Other specified pleural conditions: Secondary | ICD-10-CM | POA: Diagnosis not present

## 2020-02-14 DIAGNOSIS — Z9689 Presence of other specified functional implants: Secondary | ICD-10-CM | POA: Diagnosis not present

## 2020-02-14 HISTORY — PX: IR RADIOLOGIST EVAL & MGMT: IMG5224

## 2020-02-14 NOTE — Progress Notes (Signed)
Chief Complaint: Patient was consulted today for follow up after TIPS on 4/27  History of Present Illness: Shawn Martin is a 44 y.o. male with a history of alcoholic cirrhosis, celiac disease, esophageal varices, portal hypertensive gastropathy, refractory ascites and symptomatic recurrent right hepatic hydrothorax who underwent TIPS placement on 07/26/2019.  The procedure was complicated by some respiratory failure likely on the basis of reexpansion pulmonary edema in the right lung after large volume thoracentesis during TIPS placement. He was discharged from the hospital on 07/29/2019.  Since the procedure, he has really done well. He has been able to come off all diuretics and has not required any further paracentesis procedures. A TIPS duplex ultrasound on 08/22/2019 demonstrates a widely patent TIPS shunt with good flow.  No significant ascites is identified with some trace fluid adjacent to the liver.  He has seen his GI team who is quite pleased with his outcome. He has gone back to working full time. Occasionally takes Lactulose to keep himself regular but states he only has to take it a few times a week.  He returns today for follow up TIPS [place] and visit.  Past Medical History:  Diagnosis Date  . Anemia   . Celiac disease   . Cirrhosis ([place])   . Hepatic cirrhosis ([place])   . Hypertension   . 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 IR PARACENTESIS  02/07/2019  . IR PARACENTESIS  05/25/2019  . IR PARACENTESIS  06/10/2019  . IR PARACENTESIS  06/15/2019  . IR PARACENTESIS  06/22/2019  . IR PARACENTESIS  07/01/2019  . IR PARACENTESIS  07/12/2019  . IR PARACENTESIS  07/19/2019  . IR PARACENTESIS  07/26/2019  . IR RADIOLOGIST EVAL & MGMT  07/07/2019  . IR RADIOLOGIST EVAL & MGMT  08/23/2019  . IR THORACENTESIS ASP PLEURAL SPACE W/IMG GUIDE  07/26/2019  . IR TIPS  07/26/2019  . IR TRANSCATHETER BX   06/22/2019  . IR US GUIDE VASC ACCESS RIGHT  06/22/2019  . IR VENOGRAM HEPATIC WO HEMODYNAMIC EVALUATION  06/22/2019  . NOSE SURGERY    . RADIOLOGY WITH ANESTHESIA N/A 07/26/2019   Procedure: TIPS;  Surgeon: Aletta Edouard, MD;  Location: [facility];  Service: Radiology;  Laterality: N/A;    Allergies: Gluten meal  Medications:  Current Outpatient Medications:  .  cetirizine (ZYRTEC) 10 MG tablet, Take 10 mg by mouth daily., Disp: , Rfl:  .  ferrous sulfate 325 (65 FE) MG tablet, Take 325 mg by mouth daily., Disp: , Rfl:  .  lactulose, encephalopathy, (CHRONULAC) 10 GM/15ML SOLN, TAKE 15ML BY MOUTH TWICE DAILY PLEASE CALL YOUR GI DOCTOR IF YOU START TO FEEL CONFUSED., Disp: 900 mL, Rfl: 1 .  Multiple Vitamin (MULTIVITAMIN WITH MINERALS) TABS tablet, Take 1 tablet by mouth daily., Disp: , Rfl:  .  Zinc 10 MG LOZG, Use as directed in the mouth or throat., Disp: , Rfl:     Family History  Problem Relation Age of Onset  . Healthy Mother   . Hypertension Father   . Heart attack Father   . Leukemia Brother   . Colon cancer Neg Hx   . Stomach cancer Neg Hx   . Pancreatic cancer Neg Hx     Social History   Socioeconomic History  . Marital status: Married    Spouse name: Not on file  . Number of children: 2  . Years of education:  Not on file  . Highest education level: Not on file  Occupational History  . Not on file  Tobacco Use  . Smoking status: Former Research scientist (life sciences)  . Smokeless tobacco: Former Systems developer    Types: [place] date: 03/2017  Vaping Use  . Vaping Use: Never used  Substance and Sexual Activity  . Alcohol use: Not Currently    Comment: every day drinker, 6 pack per day  . Drug use: Not Currently  . Sexual activity: Yes    Partners: Female  Other Topics Concern  . Not on file  Social History Narrative  . Not on file   Social Determinants of Health   Financial Resource Strain:   . Difficulty of Paying Living Expenses: Not on file  Food Insecurity:   . Worried About  Charity fundraiser in the Last Year: Not on file  . Ran Out of Food in the Last Year: Not on file  Transportation Needs:   . Lack of Transportation (Medical): Not on file  . Lack of Transportation (Non-Medical): Not on file  Physical Activity:   . Days of Exercise per Week: Not on file  . Minutes of Exercise per Session: Not on file  Stress:   . Feeling of Stress : Not on file  Social Connections:   . Frequency of Communication with Friends and Family: Not on file  . Frequency of Social Gatherings with Friends and Family: Not on file  . Attends Religious Services: Not on file  . Active Member of Clubs or Organizations: Not on file  . Attends Archivist Meetings: Not on file  . Marital Status: Not on file    Review of Systems  Constitutional: Negative.   HENT: Negative.   Respiratory: Negative.   Cardiovascular: Negative.   Gastrointestinal: Negative for abdominal distention, abdominal pain, blood in stool, constipation, nausea, rectal pain and vomiting.       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  Genitourinary: Negative.   Musculoskeletal: Negative.   Skin: Negative.   Neurological: Negative.   Hematological: Negative.     Review of Systems: A 12 point ROS discussed and pertinent positives are indicated in the HPI above.  All other systems are negative.  Physical Exam Constitutional:      Appearance: Normal appearance. He is not ill-appearing.  Cardiovascular:     Rate and Rhythm: Normal rate and regular rhythm.     Heart sounds: Normal heart sounds.  Pulmonary:     Effort: Pulmonary effort is normal. No respiratory distress.     Breath sounds: Normal breath sounds.  Abdominal:     General: Abdomen is flat. There is no distension.     Palpations: Abdomen is soft.     Tenderness: There is no abdominal tenderness.  Skin:    Coloration: Skin is not jaundiced.  Neurological:     General: No focal deficit present.     Mental Status: He is alert and  oriented to person, place, and time.  Psychiatric:        Mood and Affect: Mood normal.        Thought Content: Thought content normal.        Judgment: Judgment normal.       Vital Signs: There were no vitals taken for this visit.  Imaging: US ABDOMINAL PELVIC ART/VENT FLOW DOPPLER  Result Date: 02/14/2020 CLINICAL DATA:  Status post TIPS procedure on 07/26/2019 to treat ascites and right hepatic hydrothorax secondary to cirrhosis  and portal hypertension. EXAM: DUPLEX ULTRASOUND OF LIVER AND TIPS SHUNT TECHNIQUE: Color and duplex Doppler ultrasound was performed to evaluate the hepatic in-flow and out-flow vessels. COMPARISON:  08/22/2019 FINDINGS: Portal Vein Velocities Main:  42 cm/sec Right:  103 cm/sec Left:  10 cm/sec TIPS Stent Velocities Proximal:  76 cm/sec Mid:  108 Distal:  103 cm/sec IVC: Present and patent with normal respiratory phasicity. Hepatic Vein Velocities Right:  103 cm/sec Mid:  14 cm/sec Left: Unable to visualize. Splenic Vein: 19 Superior Mesenteric Vein: 14 Hepatic Artery: 129 centimeter/second Ascites: Absent Varices: Absent Other findings: Somewhat reduced velocities in the TIPS shunt since the prior study but the endograft appears normally patent. No evidence of portal vein thrombus. IMPRESSION: Patent TIPS shunt. There is some reduction in measured velocities throughout the endograft compared to the prior study, but the shunt appears widely patent. Electronically Signed   By: Aletta Edouard M.D.   On: 02/14/2020 09:08    Labs:  CBC: Recent Labs    07/27/19 1506 07/29/19 0359 08/04/19 1432 08/19/19 1425  WBC 8.9 5.8 4.6 3.5*  HGB 10.0* 10.4* 12.6* 13.2  HCT 29.3* 30.2* 36.6* 38.5*  PLT 105* 102* 133.0* 128.0*    COAGS: Recent Labs    06/30/19 1420 07/01/19 0818 07/04/19 0235 07/19/19 1411 07/26/19 0632 08/19/19 1425  INR 1.2   < > 1.3* 1.1 1.1 1.2*  APTT 34  --   --   --   --   --    < > = values in this interval not displayed.     BMP: Recent Labs    07/19/19 1411 07/19/19 1411 07/26/19 0632 07/26/19 1111 07/27/19 0354 07/27/19 0354 07/29/19 0359 08/04/19 1432 08/19/19 1425 12/08/19 1610  NA 131*   < > 132*   < > 137   < > 133* 134* 134* 139  K 4.6   < > 4.0   < > 4.3   < > 3.7 3.8 3.7 3.5  CL 99   < > 100  --  106   < > 105 101 97 101  CO2 25   < > 22  --  19*   < > 23 27 31 31   GLUCOSE 113*   < > 99  --  118*   < > 107* 97 109* 88  BUN 10   < > 14  --  15   < > 11 8 10 7   CALCIUM 8.5*   < > 8.5*  --  8.5*   < > 7.8* 8.3* 9.4 8.7  CREATININE 1.14   < > 1.06  --  1.05   < > 0.67 1.06 0.85 1.01  GFRNONAA >60  --  >60  --  >60  --  >60  --   --   --   GFRAA >60  --  >60  --  >60  --  >60  --   --   --    < > = values in this interval not displayed.    LIVER FUNCTION TESTS: Recent Labs    07/29/19 0359 08/04/19 1432 08/19/19 1425 12/08/19 1610  BILITOT 1.6* 1.2 2.6* 1.5*  AST 76* 55* 54* 45*  ALT 42 32 22 21  ALKPHOS 64 125* 194* 208*  PROT 4.2* 6.1 7.1 7.5  ALBUMIN 2.3* 3.3* 3.6 3.6    TUMOR MARKERS: Recent Labs    12/08/19 1610  AFPTM 3.1    Assessment and Plan: [name] has had a remarkable response  after TIPS placement on 4/27 with resolution of the large volume symptomatic right-sided hepatic hydrothorax and essentially complete resolution of ascites with only a trace amount of fluid seen adjacent to the liver by ultrasound.  The TIPS shunt appears widely patent by duplex ultrasound today, see full report.  We recommended another follow-up duplex ultrasound in 6 months.   He will continue to follow-up with [place] and Dr. Havery Moros.   Electronically Signed: Ascencion Dike 02/14/2020, 9:12 AM     I spent a total of 20 Minutes in remote  clinical consultation, greater than 50% of which was counseling/coordinating care post TIPS.

## 2020-03-12 MED FILL — LACTULOSE 10 GM/15 ML SOLN: 10 | 30 days supply | Qty: 900 | Fill #1

## 2020-04-30 ENCOUNTER — Telehealth: Payer: Self-pay | Admitting: Family Medicine

## 2020-04-30 NOTE — Telephone Encounter (Signed)
Done

## 2020-06-11 ENCOUNTER — Other Ambulatory Visit: Payer: Self-pay | Admitting: Physician Assistant

## 2020-07-23 ENCOUNTER — Other Ambulatory Visit: Payer: Self-pay

## 2020-07-23 ENCOUNTER — Emergency Department
Admission: EM | Admit: 2020-07-23 | Discharge: 2020-07-23 | Disposition: A | Discharge status: Home / Self Care | Payer: BC Managed Care – PPO | Attending: Emergency Medicine | Admitting: Emergency Medicine

## 2020-07-23 ENCOUNTER — Encounter: Payer: Self-pay | Admitting: Emergency Medicine

## 2020-07-23 DIAGNOSIS — M5442 Lumbago with sciatica, left side: Secondary | ICD-10-CM | POA: Insufficient documentation

## 2020-07-23 DIAGNOSIS — M79605 Pain in left leg: Secondary | ICD-10-CM | POA: Insufficient documentation

## 2020-07-23 DIAGNOSIS — M5432 Sciatica, left side: Secondary | ICD-10-CM

## 2020-07-23 DIAGNOSIS — I10 Essential (primary) hypertension: Secondary | ICD-10-CM | POA: Insufficient documentation

## 2020-07-23 DIAGNOSIS — F17200 Nicotine dependence, unspecified, uncomplicated: Secondary | ICD-10-CM | POA: Insufficient documentation

## 2020-07-23 MED ORDER — KETOROLAC TROMETHAMINE 60 MG/2ML IM SOLN
30.0000 mg | Freq: Once | INTRAMUSCULAR | Status: AC
  Administered 2020-07-23: 30 mg via INTRAMUSCULAR
  Filled 2020-07-23: qty 2

## 2020-07-23 MED ORDER — OXYCODONE HCL 5 MG PO TABS: 5.0000 mg | ORAL_TABLET | ORAL | 0 refills | Status: DC | PRN

## 2020-07-23 NOTE — Discharge Instructions (Signed)
You may take Oxycodone as needed for pain.  Return to the ER for worsening symptoms, extremity weakness, losing control of your bowel or bladder, or other concerns.

## 2020-07-23 NOTE — ED Triage Notes (Signed)
Pt arrived via POV with back pain, has hx of sciatica, wants Toradol shot, states he isn't sure what he can take at home because he has hx of cirrhosis.

## 2020-07-23 NOTE — ED Provider Notes (Signed)
[facility] Emergency Department Provider Note   ____________________________________________   Event Date/Time   First MD Initiated Contact with Patient 07/23/20 [phone]     (approximate)  I have reviewed the triage vital signs and the nursing notes.   HISTORY  Chief Complaint Sciatica    HPI Shawn Martin is a 45 y.o. male who presents to the ED from home with a chief complaint of nontraumatic back pain.  Patient reports a 2-day history of pain in his left buttock radiating down his leg.  History of sciatica and states this feels similarly.  Denies fall/injury/trauma.  History of cirrhosis and presents for Toradol shots.  Denies extremity weakness, bowel or bladder incontinence.     Past Medical History:  Diagnosis Date  . Anemia   . Celiac disease   . Cirrhosis ([place])   . Hepatic cirrhosis ([place])   . Hypertension   . 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 Cirrhosis ([place]) 07/26/2019  . Acute respiratory failure with hypoxia ([place]) 07/26/2019  . Hyperkalemia 06/30/2019  . AKI (acute kidney injury) ([place]) 06/30/2019  . Hyponatremia 06/03/2019  . Alcohol dependence ([place]) 06/03/2019  . Symptomatic anemia 06/03/2019  . Ascites due to alcoholic cirrhosis ([place]) 09/47/0962  . Alcoholic cirrhosis of liver with ascites ([place]) 01/28/2019  . Screening for viral disease 01/28/2019    Past Surgical History:  Procedure Laterality Date  . IR PARACENTESIS  02/07/2019  . IR PARACENTESIS  05/25/2019  . IR PARACENTESIS  06/10/2019  . IR PARACENTESIS  06/15/2019  . IR PARACENTESIS  06/22/2019  . IR PARACENTESIS  07/01/2019  . IR PARACENTESIS  07/12/2019  . IR PARACENTESIS  07/19/2019  . IR PARACENTESIS  07/26/2019  . IR RADIOLOGIST EVAL & MGMT  07/07/2019  . IR RADIOLOGIST EVAL & MGMT  08/23/2019  . IR RADIOLOGIST EVAL & MGMT  02/14/2020  . IR THORACENTESIS ASP PLEURAL SPACE W/IMG  GUIDE  07/26/2019  . IR TIPS  07/26/2019  . IR TRANSCATHETER BX  06/22/2019  . IR US GUIDE VASC ACCESS RIGHT  06/22/2019  . IR VENOGRAM HEPATIC WO HEMODYNAMIC EVALUATION  06/22/2019  . NOSE SURGERY    . RADIOLOGY WITH ANESTHESIA N/A 07/26/2019   Procedure: TIPS;  Surgeon: Aletta Edouard, MD;  Location: [place];  Service: Radiology;  Laterality: N/A;    Prior to Admission medications   Medication Sig Start Date End Date Taking? Authorizing Provider  oxyCODONE (ROXICODONE) 5 MG immediate release tablet Take 1 tablet (5 mg total) by mouth every 4 (four) hours as needed for severe pain. 07/23/20  Yes Paulette Blanch, MD  cetirizine (ZYRTEC) 10 MG tablet Take 10 mg by mouth daily.    [provider]  ferrous sulfate 325 (65 FE) MG tablet Take 325 mg by mouth daily.    [provider]  lactulose, encephalopathy, (CHRONULAC) 10 GM/15ML SOLN TAKE 15ML BY MOUTH TWICE DAILY PLEASE CALL YOUR GI DOCTOR IF YOU START TO FEEL CONFUSED. 06/11/20 06/11/21  Esterwood, Amy S, PA-C  Multiple Vitamin (MULTIVITAMIN WITH MINERALS) TABS tablet Take 1 tablet by mouth daily.    [provider]  Zinc 10 MG LOZG Use as directed in the mouth or throat.    [provider]    Allergies Gluten meal  Family History  Problem Relation Age of Onset  . Healthy Mother   . Hypertension Father   . Heart attack Father   .  Leukemia Brother   . Colon cancer Neg Hx   . Stomach cancer Neg Hx   . Pancreatic cancer Neg Hx     Social History Social History   Tobacco Use  . Smoking status: Former Research scientist (life sciences)  . Smokeless tobacco: Former Systems developer    Types: [place] date: 03/2017  Vaping Use  . Vaping Use: Never used  Substance Use Topics  . Alcohol use: Not Currently    Comment: every day drinker, 6 pack per day  . Drug use: Not Currently    Review of Systems  Constitutional: No fever/chills Eyes: No visual changes. ENT: No sore throat. Cardiovascular: Denies chest pain. Respiratory: Denies  shortness of breath. Gastrointestinal: No abdominal pain.  No nausea, no vomiting.  No diarrhea.  No constipation. Genitourinary: Negative for dysuria. Musculoskeletal: Positive for back pain. Skin: Negative for rash. Neurological: Negative for headaches, focal weakness or numbness.   ____________________________________________   PHYSICAL EXAM:  VITAL SIGNS: ED Triage Vitals  Enc Vitals Group     BP 07/23/20 0502 (!) 155/98     Pulse Rate 07/23/20 0502 (!) 114     Resp 07/23/20 0502 20     Temp 07/23/20 0502 98.5 F (36.9 C)     Temp Source 07/23/20 0502 Oral     SpO2 07/23/20 0502 97 %     Weight 07/23/20 0501 205 lb (93 kg)     Height 07/23/20 0501 6' (1.829 m)     Head Circumference --      Peak Flow --      Pain Score 07/23/20 0500 7     Pain Loc --      Pain Edu? --      Excl. in [place]? --     Constitutional: Alert and oriented. Well appearing and in mild acute distress. Eyes: Conjunctivae are normal. PERRL. EOMI. Head: Atraumatic. Nose: No congestion/rhinnorhea. Mouth/Throat: Mucous membranes are moist.   Neck: No stridor.   Cardiovascular: Normal rate, regular rhythm. Grossly normal heart sounds.  Good peripheral circulation. Respiratory: Normal respiratory effort.  No retractions. Lungs CTAB. Gastrointestinal: Soft and nontender. No distention. No abdominal bruits. No CVA tenderness. Musculoskeletal: No spinal tenderness to palpation.  Left buttock point tender to palpation.  Negative straight leg raise.  No lower extremity tenderness nor edema.  No joint effusions. Neurologic:  Normal speech and language. No gross focal neurologic deficits are appreciated.  Skin:  Skin is warm, dry and intact. No rash noted. Psychiatric: Mood and affect are normal. Speech and behavior are normal.  ____________________________________________   LABS (all labs ordered are listed, but only abnormal results are displayed)  Labs Reviewed - No data to  display ____________________________________________  EKG  None ____________________________________________  RADIOLOGY I, Masao Junker J, personally viewed and evaluated these images (plain radiographs) as part of my medical decision making, as well as reviewing the written report by the radiologist.  ED MD interpretation: None  Official radiology report(s): No results found.  ____________________________________________   PROCEDURES  Procedure(s) performed (including Critical Care):  Procedures   ____________________________________________   INITIAL IMPRESSION / ASSESSMENT AND PLAN / ED COURSE  As part of my medical decision making, I reviewed the following data within the [place] notes reviewed and incorporated, Old chart reviewed, Notes from prior ED visits and [place] Controlled Substance Database     45 year old male presenting with left sciatica.  IM Toradol now, limited prescription for Oxycodone at discharge.  Strict precautions given.  Patient verbalizes understanding agrees with plan of care.      ____________________________________________   FINAL CLINICAL IMPRESSION(S) / ED DIAGNOSES  Final diagnoses:  Sciatica of left side     ED Discharge Orders         Ordered    oxyCODONE (ROXICODONE) 5 MG immediate release tablet  Every 4 hours PRN        07/23/20 0546          *Please note:  Shawn Martin was evaluated in Emergency Department on 07/29/2020 for the symptoms described in the history of present illness. He was evaluated in the context of the global COVID-19 pandemic, which necessitated consideration that the patient might be at risk for infection with the SARS-CoV-2 virus that causes COVID-19. Institutional protocols and algorithms that pertain to the evaluation of patients at risk for COVID-19 are in a state of rapid change based on information released by regulatory bodies including the CDC and federal and state  organizations. These policies and algorithms were followed during the patient's care in the ED.  Some ED evaluations and interventions may be delayed as a result of limited staffing during and the pandemic.*   Note:  This document was prepared using Dragon voice recognition software and may include unintentional dictation errors.   Paulette Blanch, MD 07/29/20 [phone]

## 2020-07-25 ENCOUNTER — Other Ambulatory Visit: Payer: Self-pay | Admitting: Interventional Radiology

## 2020-07-25 DIAGNOSIS — D509 Iron deficiency anemia, unspecified: Secondary | ICD-10-CM

## 2020-07-25 DIAGNOSIS — R188 Other ascites: Secondary | ICD-10-CM

## 2020-07-25 DIAGNOSIS — I8500 Esophageal varices without bleeding: Secondary | ICD-10-CM

## 2020-07-25 DIAGNOSIS — K7460 Unspecified cirrhosis of liver: Secondary | ICD-10-CM

## 2020-07-25 DIAGNOSIS — K900 Celiac disease: Secondary | ICD-10-CM

## 2020-07-25 DIAGNOSIS — Z1211 Encounter for screening for malignant neoplasm of colon: Secondary | ICD-10-CM

## 2020-08-13 ENCOUNTER — Other Ambulatory Visit: Payer: Self-pay

## 2020-08-13 MED FILL — Lactulose (Encephalopathy) Solution 10 GM/15ML: ORAL | 30 days supply | Qty: 900 | Fill #0 | Status: AC

## 2020-08-14 ENCOUNTER — Other Ambulatory Visit: Payer: Self-pay

## 2020-08-22 ENCOUNTER — Other Ambulatory Visit: Payer: Self-pay

## 2020-08-22 ENCOUNTER — Emergency Department
Admission: EM | Admit: 2020-08-22 | Discharge: 2020-08-22 | Disposition: A | Discharge status: Home / Self Care | Payer: BC Managed Care – PPO | Attending: Emergency Medicine | Admitting: Emergency Medicine

## 2020-08-22 DIAGNOSIS — M5431 Sciatica, right side: Secondary | ICD-10-CM | POA: Diagnosis not present

## 2020-08-22 DIAGNOSIS — Z87891 Personal history of nicotine dependence: Secondary | ICD-10-CM | POA: Insufficient documentation

## 2020-08-22 DIAGNOSIS — M5442 Lumbago with sciatica, left side: Secondary | ICD-10-CM | POA: Diagnosis not present

## 2020-08-22 DIAGNOSIS — M5441 Lumbago with sciatica, right side: Secondary | ICD-10-CM | POA: Diagnosis not present

## 2020-08-22 DIAGNOSIS — M545 Low back pain, unspecified: Secondary | ICD-10-CM | POA: Diagnosis present

## 2020-08-22 DIAGNOSIS — M5432 Sciatica, left side: Secondary | ICD-10-CM | POA: Diagnosis not present

## 2020-08-22 MED ORDER — MORPHINE SULFATE (PF) 4 MG/ML IV SOLN
6.0000 mg | Freq: Once | INTRAVENOUS | Status: AC
  Administered 2020-08-22: 6 mg via INTRAMUSCULAR
  Filled 2020-08-22: qty 2

## 2020-08-22 MED ORDER — PREDNISONE 20 MG PO TABS: 40.0000 mg | ORAL_TABLET | Freq: Every day | ORAL | 0 refills | Status: AC

## 2020-08-22 MED ORDER — DEXAMETHASONE SODIUM PHOSPHATE 10 MG/ML IJ SOLN
10.0000 mg | Freq: Once | INTRAMUSCULAR | Status: AC
  Administered 2020-08-22: 10 mg via INTRAMUSCULAR
  Filled 2020-08-22: qty 1

## 2020-08-22 MED ORDER — KETOROLAC TROMETHAMINE 60 MG/2ML IM SOLN
60.0000 mg | Freq: Once | INTRAMUSCULAR | Status: AC
  Administered 2020-08-22: 60 mg via INTRAMUSCULAR
  Filled 2020-08-22: qty 2

## 2020-08-22 MED ORDER — OXYCODONE HCL 5 MG PO TABS: 5.0000 mg | ORAL_TABLET | ORAL | 0 refills | Status: DC | PRN

## 2020-08-22 NOTE — ED Triage Notes (Signed)
Pt c/o left lower back pain that flared up after going out of town over the weekend, hx of sciatica pain

## 2020-08-22 NOTE — ED Notes (Signed)
No e sig pad. Attempted to print discharge consent 3 times, did not print. Pt gave verbal consent to be discharged.

## 2020-08-22 NOTE — ED Notes (Signed)
Pt to ED stating has sciatica flare up after driving long hours past weekend. Hx sciatica. C/o 7/10 sharp, shooting pain down L buttock and through L leg. Is ambulatory but states is painful to move L leg.

## 2020-08-22 NOTE — ED Provider Notes (Signed)
[facility] Emergency Department Provider Note  ____________________________________________   Event Date/Time   First MD Initiated Contact with Patient 08/22/20 [phone]     (approximate)  I have reviewed the triage vital signs and the nursing notes.   HISTORY  Chief Complaint Back Pain    HPI Shawn Martin is a 45 y.o. male with history of cirrhosis here with back pain.  The patient has a well-documented history of sciatica.  He has seen multiple specialist for this.  He states that he had to sit in his car for an extended period of time over the last week, and he has subsequently developed aching, throbbing, shooting, pain along his lower back and left greater than right gluteal areas.  The pain radiates down to his foot.  He has some associated numbness and tingling like it is falling asleep.  Denies any weakness.  No gait problems.  No loss of bowel or bladder function.  He has a history of sciatica and feels like his symptoms are similar to his usual sciatica.  No falls.  No other complaints.  No fevers or chills.        Past Medical History:  Diagnosis Date  . Anemia   . Celiac disease   . Cirrhosis ([place])   . Hepatic cirrhosis ([place])   . Hypertension   . 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 Cirrhosis ([place]) 07/26/2019  . Acute respiratory failure with hypoxia ([place]) 07/26/2019  . Hyperkalemia 06/30/2019  . AKI (acute kidney injury) ([place]) 06/30/2019  . Hyponatremia 06/03/2019  . Alcohol dependence ([place]) 06/03/2019  . Symptomatic anemia 06/03/2019  . Ascites due to alcoholic cirrhosis ([place]) 67/20/9470  . Alcoholic cirrhosis of liver with ascites ([place]) 01/28/2019  . Screening for viral disease 01/28/2019    Past Surgical History:  Procedure Laterality Date  . IR PARACENTESIS  02/07/2019  . IR PARACENTESIS  05/25/2019  . IR PARACENTESIS  06/10/2019  . IR  PARACENTESIS  06/15/2019  . IR PARACENTESIS  06/22/2019  . IR PARACENTESIS  07/01/2019  . IR PARACENTESIS  07/12/2019  . IR PARACENTESIS  07/19/2019  . IR PARACENTESIS  07/26/2019  . IR RADIOLOGIST EVAL & MGMT  07/07/2019  . IR RADIOLOGIST EVAL & MGMT  08/23/2019  . IR RADIOLOGIST EVAL & MGMT  02/14/2020  . IR THORACENTESIS ASP PLEURAL SPACE W/IMG GUIDE  07/26/2019  . IR TIPS  07/26/2019  . IR TRANSCATHETER BX  06/22/2019  . IR US GUIDE VASC ACCESS RIGHT  06/22/2019  . IR VENOGRAM HEPATIC WO HEMODYNAMIC EVALUATION  06/22/2019  . NOSE SURGERY    . RADIOLOGY WITH ANESTHESIA N/A 07/26/2019   Procedure: TIPS;  Surgeon: Aletta Edouard, MD;  Location: [place];  Service: Radiology;  Laterality: N/A;    Prior to Admission medications   Medication Sig Start Date End Date Taking? Authorizing Provider  oxyCODONE (ROXICODONE) 5 MG immediate release tablet Take 1 tablet (5 mg total) by mouth every 4 (four) hours as needed for moderate pain or severe pain (no more than 6 tabs daily). 08/22/20 08/22/21 Yes Duffy Bruce, MD  predniSONE (DELTASONE) 20 MG tablet Take 2 tablets (40 mg total) by mouth daily for 5 days. 08/22/20 08/27/20 Yes Duffy Bruce, MD  cetirizine (ZYRTEC) 10 MG tablet Take 10 mg by mouth daily.    [provider]  ferrous sulfate 325 (65 FE) MG tablet Take 325 mg by  mouth daily.    [provider]  lactulose, encephalopathy, (CHRONULAC) 10 GM/15ML SOLN TAKE 15ML BY MOUTH TWICE DAILY PLEASE CALL YOUR GI DOCTOR IF YOU START TO FEEL CONFUSED. 06/11/20 06/11/21  Esterwood, Amy S, PA-C  Multiple Vitamin (MULTIVITAMIN WITH MINERALS) TABS tablet Take 1 tablet by mouth daily.    [provider]  Zinc 10 MG LOZG Use as directed in the mouth or throat.    [provider]    Allergies Gluten meal  Family History  Problem Relation Age of Onset  . Healthy Mother   . Hypertension Father   . Heart attack Father   . Leukemia Brother   . Colon cancer Neg Hx   . Stomach  cancer Neg Hx   . Pancreatic cancer Neg Hx     Social History Social History   Tobacco Use  . Smoking status: Former Research scientist (life sciences)  . Smokeless tobacco: Former Systems developer    Types: [place] date: 03/2017  Vaping Use  . Vaping Use: Never used  Substance Use Topics  . Alcohol use: Not Currently    Comment: every day drinker, 6 pack per day  . Drug use: Not Currently    Review of Systems  Review of Systems  Constitutional: Negative for chills and fever.  HENT: Negative for sore throat.   Respiratory: Negative for shortness of breath.   Cardiovascular: Negative for chest pain.  Gastrointestinal: Negative for abdominal pain.  Genitourinary: Negative for flank pain.  Musculoskeletal: Positive for arthralgias and back pain. Negative for neck pain.  Skin: Negative for rash and wound.  Allergic/Immunologic: Negative for immunocompromised state.  Neurological: Positive for numbness. Negative for weakness.  Hematological: Does not bruise/bleed easily.  All other systems reviewed and are negative.    ____________________________________________  PHYSICAL EXAM:      VITAL SIGNS: ED Triage Vitals  Enc Vitals Group     BP 08/22/20 0727 (!) 172/101     Pulse Rate 08/22/20 0727 99     Resp 08/22/20 0727 18     Temp 08/22/20 0727 98.5 F (36.9 C)     Temp Source 08/22/20 0727 Oral     SpO2 08/22/20 0727 96 %     Weight 08/22/20 0727 205 lb (93 kg)     Height 08/22/20 0725 6' (1.829 m)     Head Circumference --      Peak Flow --      Pain Score 08/22/20 0725 8     Pain Loc --      Pain Edu? --      Excl. in [place]? --      Physical Exam Vitals and nursing note reviewed.  Constitutional:      General: He is not in acute distress.    Appearance: He is well-developed.  HENT:     Head: Normocephalic and atraumatic.  Eyes:     Conjunctiva/sclera: Conjunctivae normal.  Cardiovascular:     Rate and Rhythm: Normal rate and regular rhythm.     Heart sounds: Normal heart sounds.   Pulmonary:     Effort: Pulmonary effort is normal. No respiratory distress.     Breath sounds: No wheezing.  Abdominal:     General: There is no distension.  Musculoskeletal:     Cervical back: Neck supple.  Skin:    General: Skin is warm.     Capillary Refill: Capillary refill takes less than 2 seconds.     Findings: No rash.  Neurological:  Mental Status: He is alert and oriented to person, place, and time.     Motor: No abnormal muscle tone.      Spine Exam: Inspection/Palpation: R sacral paraspinal TTP, worse over R posterior gluteal area. Strength: 5/5 throughout LE bilaterally (hip flexion/extension, adduction/abduction; knee flexion/extension; foot dorsiflexion/plantarflexion, inversion/eversion; great toe inversion) Sensation: Intact to light touch in proximal and distal LE bilaterally Reflexes: 2+ quadriceps and achilles reflexes   ____________________________________________   LABS (all labs ordered are listed, but only abnormal results are displayed)  Labs Reviewed - No data to display  ____________________________________________  EKG:  ________________________________________  RADIOLOGY All imaging, including plain films, CT scans, and ultrasounds, independently reviewed by me, and interpretations confirmed via formal radiology reads.  ED MD interpretation:     Official radiology report(s): No results found.  ____________________________________________  PROCEDURES   Procedure(s) performed (including Critical Care):  Procedures  ____________________________________________  INITIAL IMPRESSION / MDM / [place] / ED COURSE  As part of my medical decision making, I reviewed the following data within the [facility] notes reviewed and incorporated, Old chart reviewed, Notes from prior ED visits, and [place] Controlled Substance Database       *Dalen Fain Martin was evaluated in Emergency Department on  08/22/2020 for the symptoms described in the history of present illness. He was evaluated in the context of the global COVID-19 pandemic, which necessitated consideration that the patient might be at risk for infection with the SARS-CoV-2 virus that causes COVID-19. Institutional protocols and algorithms that pertain to the evaluation of patients at risk for COVID-19 are in a state of rapid change based on information released by regulatory bodies including the CDC and federal and state organizations. These policies and algorithms were followed during the patient's care in the ED.  Some ED evaluations and interventions may be delayed as a result of limited staffing during the pandemic.*     Medical Decision Making: 45 year old male here with suspected acute on chronic sciatica in the setting of increased physical activity.  Patient has no distal weakness, numbness, or new neurological deficits.  Reflexes intact.  No signs of cauda equina or acute cord compression.  No fevers, chills, or infectious symptoms to suggest osteomyelitis or epidural abscess.  Patient given meds here with improvement.  Will DC with prednisone and brief course of analgesics.  Return precautions given.  ____________________________________________  FINAL CLINICAL IMPRESSION(S) / ED DIAGNOSES  Final diagnoses:  Bilateral sciatica     MEDICATIONS GIVEN DURING THIS VISIT:  Medications  ketorolac (TORADOL) injection 60 mg (60 mg Intramuscular Given 08/22/20 0821)  dexamethasone (DECADRON) injection 10 mg (10 mg Intramuscular Given 08/22/20 0826)  morphine 4 MG/ML injection 6 mg (6 mg Intramuscular Given 08/22/20 0827)     ED Discharge Orders         Ordered    predniSONE (DELTASONE) 20 MG tablet  Daily        08/22/20 0838    oxyCODONE (ROXICODONE) 5 MG immediate release tablet  Every 4 hours PRN        08/22/20 1638           Note:  This document was prepared using Dragon voice recognition software and may include  unintentional dictation errors.   Duffy Bruce, MD 08/22/20 [phone]

## 2020-08-23 NOTE — Telephone Encounter (Signed)
Formatting of this note might be different from the original.  ----- Message from Ripley Fraise sent at 08/23/2020 10:13 AM EDT -----  Regarding: ER FU Appt  Contact: [phone]  Patient was seen at the ER at [facility] on Wed., 08/22/2020 for back pain - Was referred to C. Myer Haff. Requesting an ER FU appt.    Electronically signed by Gaylyn Cheers, RN at 08/23/2020 10:42 AM EDT

## 2020-08-23 NOTE — Telephone Encounter (Signed)
Formatting of this note might be different from the original.  Patient agreed to try conservative treatment. He had injections over 2 years ago in . He does not have those notes.   Electronically signed by Rockey Situ at 08/23/2020 11:09 AM EDT

## 2020-08-23 NOTE — Telephone Encounter (Signed)
Formatting of this note might be different from the original.  Appt scheduled. Patient will call [facility] in [place] to get records faxed to [place]  Electronically signed by Elijio Miles, CMA at 08/23/2020 11:51 AM EDT

## 2020-08-23 NOTE — Telephone Encounter (Signed)
Formatting of this note might be different from the original.  Per ER note on 08/22/20: acute exacerbation of chronic back pain with sciatica. Similar episode 07/23/20. I am unable to locate an MRI or any records of recent PT or injections. It may be more appropriate for him to start with Physiatry prior to seeing one of our surgeons.    Electronically signed by Gaylyn Cheers, RN at 08/23/2020 10:50 AM EDT

## 2020-08-28 ENCOUNTER — Other Ambulatory Visit: Payer: Self-pay

## 2020-08-28 ENCOUNTER — Telehealth: Payer: Self-pay

## 2020-09-06 ENCOUNTER — Other Ambulatory Visit: Payer: Self-pay

## 2020-09-06 ENCOUNTER — Telehealth: Payer: Self-pay | Admitting: Gastroenterology

## 2020-09-06 DIAGNOSIS — D509 Iron deficiency anemia, unspecified: Secondary | ICD-10-CM

## 2020-09-06 DIAGNOSIS — K7460 Unspecified cirrhosis of liver: Secondary | ICD-10-CM

## 2020-09-06 MED ORDER — FUROSEMIDE 20 MG PO TABS
20.0000 mg | ORAL_TABLET | Freq: Every day | ORAL | 1 refills | Status: DC
  Filled 2020-09-06: qty 30, 30d supply, fill #0

## 2020-09-06 MED ORDER — SPIRONOLACTONE 50 MG PO TABS
50.0000 mg | ORAL_TABLET | Freq: Every day | ORAL | 1 refills | Status: DC
  Filled 2020-09-06: qty 30, 30d supply, fill #0

## 2020-09-06 NOTE — Telephone Encounter (Addendum)
Spoke with patient in regards to recommendations. Patient will come in for lab work tomorrow. He has been scheduled for a follow up with [name], PA-C on Wednesday, 09/12/20 at 1:30 PM. Patient is aware that prescriptions have been sent in, pt requested prescriptions be sent to [facility] on file. Pt is aware that Dr. Havery Moros will further advise once he has reviewed the lab work. Advised patient that if his symptoms progress he may need to see Dr. Kathlene Cote sooner, advised him to call their office as well. Patient verbalized understanding and had no concerns at the end of the call.  Lab orders in epic.

## 2020-09-06 NOTE — Telephone Encounter (Signed)
Spoke with patient, he states that he started re-accumulating fluid 2 days ago in his right leg and abdomen. PCP told patient to call us. Pt states that his abdomen is not hard but he is retaining fluid. He states that 3 days ago he ate 2 hot dogs with Chili and no bun, he states that ever since then he "hasn't felt right." Denies a new diet or medications. He was previously on Lasix 20 mg daily and Aldactone 50 mg daily. He states that he has done fine for the last year until now. Patient states that he has an appt with Dr. Kathlene Cote on 09/18/20. Patient is requesting a prescription to help with the fluid. Thanks

## 2020-09-07 ENCOUNTER — Encounter (HOSPITAL_COMMUNITY): Payer: Self-pay

## 2020-09-07 ENCOUNTER — Other Ambulatory Visit: Payer: Self-pay

## 2020-09-07 ENCOUNTER — Other Ambulatory Visit (INDEPENDENT_AMBULATORY_CARE_PROVIDER_SITE_OTHER): Payer: Self-pay

## 2020-09-07 ENCOUNTER — Inpatient Hospital Stay (HOSPITAL_COMMUNITY)
Admission: EM | Admit: 2020-09-07 | Discharge: 2020-09-11 | DRG: 433 | Disposition: A | Discharge status: Home / Self Care | Payer: BC Managed Care – PPO | Attending: Internal Medicine | Admitting: Internal Medicine

## 2020-09-07 ENCOUNTER — Emergency Department (HOSPITAL_COMMUNITY): Payer: BC Managed Care – PPO

## 2020-09-07 ENCOUNTER — Other Ambulatory Visit: Payer: Self-pay | Admitting: Physician Assistant

## 2020-09-07 DIAGNOSIS — D6959 Other secondary thrombocytopenia: Secondary | ICD-10-CM | POA: Diagnosis present

## 2020-09-07 DIAGNOSIS — K7031 Alcoholic cirrhosis of liver with ascites: Principal | ICD-10-CM

## 2020-09-07 DIAGNOSIS — K7460 Unspecified cirrhosis of liver: Secondary | ICD-10-CM

## 2020-09-07 DIAGNOSIS — E8770 Fluid overload, unspecified: Secondary | ICD-10-CM | POA: Diagnosis present

## 2020-09-07 DIAGNOSIS — K900 Celiac disease: Secondary | ICD-10-CM | POA: Diagnosis not present

## 2020-09-07 DIAGNOSIS — D638 Anemia in other chronic diseases classified elsewhere: Secondary | ICD-10-CM | POA: Diagnosis not present

## 2020-09-07 DIAGNOSIS — M7989 Other specified soft tissue disorders: Secondary | ICD-10-CM | POA: Diagnosis not present

## 2020-09-07 DIAGNOSIS — E876 Hypokalemia: Secondary | ICD-10-CM | POA: Diagnosis present

## 2020-09-07 DIAGNOSIS — I10 Essential (primary) hypertension: Secondary | ICD-10-CM | POA: Diagnosis not present

## 2020-09-07 DIAGNOSIS — J309 Allergic rhinitis, unspecified: Secondary | ICD-10-CM | POA: Diagnosis present

## 2020-09-07 DIAGNOSIS — Y908 Blood alcohol level of 240 mg/100 ml or more: Secondary | ICD-10-CM | POA: Diagnosis present

## 2020-09-07 DIAGNOSIS — F10929 Alcohol use, unspecified with intoxication, unspecified: Secondary | ICD-10-CM | POA: Diagnosis present

## 2020-09-07 DIAGNOSIS — Z7141 Alcohol abuse counseling and surveillance of alcoholic: Secondary | ICD-10-CM | POA: Diagnosis not present

## 2020-09-07 DIAGNOSIS — Z79899 Other long term (current) drug therapy: Secondary | ICD-10-CM | POA: Diagnosis not present

## 2020-09-07 DIAGNOSIS — Z87891 Personal history of nicotine dependence: Secondary | ICD-10-CM

## 2020-09-07 DIAGNOSIS — R791 Abnormal coagulation profile: Secondary | ICD-10-CM | POA: Diagnosis not present

## 2020-09-07 DIAGNOSIS — K766 Portal hypertension: Secondary | ICD-10-CM | POA: Diagnosis present

## 2020-09-07 DIAGNOSIS — Z8249 Family history of ischemic heart disease and other diseases of the circulatory system: Secondary | ICD-10-CM

## 2020-09-07 DIAGNOSIS — Z91018 Allergy to other foods: Secondary | ICD-10-CM

## 2020-09-07 DIAGNOSIS — D509 Iron deficiency anemia, unspecified: Secondary | ICD-10-CM

## 2020-09-07 DIAGNOSIS — E872 Acidosis, unspecified: Secondary | ICD-10-CM | POA: Diagnosis present

## 2020-09-07 DIAGNOSIS — F10920 Alcohol use, unspecified with intoxication, uncomplicated: Secondary | ICD-10-CM

## 2020-09-07 DIAGNOSIS — G9340 Encephalopathy, unspecified: Secondary | ICD-10-CM | POA: Diagnosis present

## 2020-09-07 DIAGNOSIS — D539 Nutritional anemia, unspecified: Secondary | ICD-10-CM | POA: Diagnosis not present

## 2020-09-07 DIAGNOSIS — Z20822 Contact with and (suspected) exposure to covid-19: Secondary | ICD-10-CM | POA: Diagnosis not present

## 2020-09-07 DIAGNOSIS — K7290 Hepatic failure, unspecified without coma: Secondary | ICD-10-CM | POA: Diagnosis present

## 2020-09-07 DIAGNOSIS — Z9114 Patient's other noncompliance with medication regimen: Secondary | ICD-10-CM | POA: Diagnosis not present

## 2020-09-07 DIAGNOSIS — F10129 Alcohol abuse with intoxication, unspecified: Secondary | ICD-10-CM | POA: Diagnosis present

## 2020-09-07 DIAGNOSIS — E871 Hypo-osmolality and hyponatremia: Secondary | ICD-10-CM | POA: Diagnosis not present

## 2020-09-07 DIAGNOSIS — R188 Other ascites: Secondary | ICD-10-CM

## 2020-09-07 LAB — COMPREHENSIVE METABOLIC PANEL
ALT: 55 U/L — ABNORMAL HIGH (ref 0–53)
ALT: 61 U/L — ABNORMAL HIGH (ref 0–44)
AST: 154 U/L — ABNORMAL HIGH (ref 0–37)
AST: 180 U/L — ABNORMAL HIGH (ref 15–41)
Albumin: 3.2 g/dL — ABNORMAL LOW (ref 3.5–5.2)
Albumin: 3 g/dL — ABNORMAL LOW (ref 3.5–5.0)
Alkaline Phosphatase: 174 U/L — ABNORMAL HIGH (ref 39–117)
Alkaline Phosphatase: 172 U/L — ABNORMAL HIGH (ref 38–126)
Anion gap: 12 (ref 5–15)
BUN: 4 mg/dL — ABNORMAL LOW (ref 6–23)
BUN: <5 mg/dL — ABNORMAL LOW (ref 6–20)
CO2: 32 mEq/L (ref 19–32)
CO2: 31 mmol/L (ref 22–32)
Calcium: 8.1 mg/dL — ABNORMAL LOW (ref 8.4–10.5)
Calcium: 8.3 mg/dL — ABNORMAL LOW (ref 8.9–10.3)
Chloride: 101 mEq/L (ref 96–112)
Chloride: 97 mmol/L — ABNORMAL LOW (ref 98–111)
Creatinine, Ser: 0.58 mg/dL (ref 0.40–1.50)
Creatinine, Ser: 0.6 mg/dL — ABNORMAL LOW (ref 0.61–1.24)
GFR, Estimated: >60 mL/min (ref >60)
GFR: 116.46 mL/min (ref >60.00)
Glucose, Bld: 150 mg/dL — ABNORMAL HIGH (ref 70–99)
Glucose, Bld: 142 mg/dL — ABNORMAL HIGH (ref 70–99)
Potassium: 3 mEq/L — ABNORMAL LOW (ref 3.5–5.1)
Potassium: 3 mmol/L — ABNORMAL LOW (ref 3.5–5.1)
Sodium: 144 mEq/L (ref 135–145)
Sodium: 140 mmol/L (ref 135–145)
Total Bilirubin: 4.9 mg/dL — ABNORMAL HIGH (ref 0.2–1.2)
Total Bilirubin: 4.6 mg/dL — ABNORMAL HIGH (ref 0.3–1.2)
Total Protein: 7.1 g/dL (ref 6.0–8.3)
Total Protein: 7.5 g/dL (ref 6.5–8.1)

## 2020-09-07 LAB — CBC WITH DIFFERENTIAL/PLATELET
Abs Immature Granulocytes: 0.01 10*3/uL (ref 0.00–0.07)
Basophils Absolute: 0.1 10*3/uL (ref 0.0–0.1)
Basophils Absolute: 0.2 10*3/uL — ABNORMAL HIGH (ref 0.0–0.1)
Basophils Relative: 2.2 % (ref 0.0–3.0)
Basophils Relative: 2 %
Eosinophils Absolute: 0.1 10*3/uL (ref 0.0–0.7)
Eosinophils Absolute: 0.2 10*3/uL (ref 0.0–0.5)
Eosinophils Relative: 2.7 % (ref 0.0–5.0)
Eosinophils Relative: 2 %
HCT: 37.5 % — ABNORMAL LOW (ref 39.0–52.0)
HCT: 38.7 % — ABNORMAL LOW (ref 39.0–52.0)
Hemoglobin: 13.3 g/dL (ref 13.0–17.0)
Hemoglobin: 13.4 g/dL (ref 13.0–17.0)
Immature Granulocytes: 0 %
Lymphocytes Relative: 36.2 % (ref 12.0–46.0)
Lymphocytes Relative: 58 %
Lymphs Abs: 1.2 10*3/uL (ref 0.7–4.0)
Lymphs Abs: 4 10*3/uL (ref 0.7–4.0)
MCH: 35.4 pg — ABNORMAL HIGH (ref 26.0–34.0)
MCHC: 35.4 g/dL (ref 30.0–36.0)
MCHC: 34.6 g/dL (ref 30.0–36.0)
MCV: 103.5 fl — ABNORMAL HIGH (ref 78.0–100.0)
MCV: 102.1 fL — ABNORMAL HIGH (ref 80.0–100.0)
Monocytes Absolute: 0.5 10*3/uL (ref 0.1–1.0)
Monocytes Absolute: 0.8 10*3/uL (ref 0.1–1.0)
Monocytes Relative: 14.2 % — ABNORMAL HIGH (ref 3.0–12.0)
Monocytes Relative: 12 %
Neutro Abs: 1.5 10*3/uL (ref 1.4–7.7)
Neutro Abs: 1.8 10*3/uL (ref 1.7–7.7)
Neutrophils Relative %: 44.7 % (ref 43.0–77.0)
Neutrophils Relative %: 26 %
Platelets: 131 10*3/uL — ABNORMAL LOW (ref 150.0–400.0)
Platelets: 136 10*3/uL — ABNORMAL LOW (ref 150–400)
RBC: 3.63 Mil/uL — ABNORMAL LOW (ref 4.22–5.81)
RBC: 3.79 MIL/uL — ABNORMAL LOW (ref 4.22–5.81)
RDW: 17.3 % — ABNORMAL HIGH (ref 11.5–15.5)
RDW: 17 % — ABNORMAL HIGH (ref 11.5–15.5)
WBC: 3.3 10*3/uL — ABNORMAL LOW (ref 4.0–10.5)
WBC: 7 10*3/uL (ref 4.0–10.5)
nRBC: 0 % (ref 0.0–0.2)

## 2020-09-07 LAB — BLOOD GAS, VENOUS
Acid-Base Excess: 9 mmol/L — ABNORMAL HIGH (ref 0.0–2.0)
Bicarbonate: 34.3 mmol/L — ABNORMAL HIGH (ref 20.0–28.0)
O2 Saturation: 77.9 %
Patient temperature: 98.6
pCO2, Ven: 50.6 mmHg (ref 44.0–60.0)
pH, Ven: 7.445 — ABNORMAL HIGH (ref 7.250–7.430)
pO2, Ven: 51.6 mmHg — ABNORMAL HIGH (ref 32.0–45.0)

## 2020-09-07 LAB — PROTIME-INR
INR: 1.4 ratio — ABNORMAL HIGH (ref 0.8–1.0)
Prothrombin Time: 15.4 s — ABNORMAL HIGH (ref 9.6–13.1)

## 2020-09-07 LAB — RESP PANEL BY RT-PCR (FLU A&B, COVID) ARPGX2
Influenza A by PCR: NEGATIVE
Influenza B by PCR: NEGATIVE
SARS Coronavirus 2 by RT PCR: NEGATIVE

## 2020-09-07 LAB — LACTIC ACID, PLASMA: Lactic Acid, Venous: 2.6 mmol/L (ref 0.5–1.9)

## 2020-09-07 LAB — ETHANOL: Alcohol, Ethyl (B): 491 mg/dL (ref <10)

## 2020-09-07 LAB — MAGNESIUM: Magnesium: 1.8 mg/dL (ref 1.7–2.4)

## 2020-09-07 MED ORDER — POTASSIUM CHLORIDE CRYS ER 20 MEQ PO TBCR: 20.0000 meq | EXTENDED_RELEASE_TABLET | Freq: Every day | ORAL | 0 refills | Status: DC

## 2020-09-07 MED ORDER — POTASSIUM CHLORIDE CRYS ER 20 MEQ PO TBCR
20.0000 meq | EXTENDED_RELEASE_TABLET | Freq: Every day | ORAL | 0 refills | Status: DC
  Filled 2020-09-07: qty 30, 30d supply, fill #0

## 2020-09-07 MED ORDER — ACETAMINOPHEN 325 MG PO TABS
650.0000 mg | ORAL_TABLET | Freq: Four times a day (QID) | ORAL | Status: DC | PRN
  Administered 2020-09-07: 650 mg via ORAL
  Filled 2020-09-07: qty 2

## 2020-09-07 MED ORDER — LACTULOSE ENCEPHALOPATHY 10 GM/15ML PO SOLN
ORAL | 1 refills | Status: DC
  Filled 2020-09-07: qty 900, 30d supply, fill #0

## 2020-09-07 MED ORDER — LORAZEPAM 2 MG/ML IJ SOLN
1.0000 mg | Freq: Four times a day (QID) | INTRAMUSCULAR | Status: DC | PRN
  Administered 2020-09-09 – 2020-09-10 (×3): 1 mg via INTRAVENOUS
  Filled 2020-09-07 (×3): qty 1

## 2020-09-07 MED ORDER — POTASSIUM CHLORIDE 10 MEQ/100ML IV SOLN
10.0000 meq | Freq: Once | INTRAVENOUS | Status: AC
  Administered 2020-09-07: 10 meq via INTRAVENOUS
  Filled 2020-09-07: qty 100

## 2020-09-07 MED ORDER — ACETAMINOPHEN 650 MG RE SUPP: 650.0000 mg | Freq: Four times a day (QID) | RECTAL | Status: DC | PRN

## 2020-09-07 MED ORDER — LACTULOSE 10 GM/15ML PO SOLN
30.0000 g | Freq: Once | ORAL | Status: AC
  Administered 2020-09-07: 30 g via ORAL
  Filled 2020-09-07: qty 60
  Filled 2020-09-07: qty 30

## 2020-09-07 NOTE — ED Notes (Signed)
Pt is refusing to give me hx. He states "you can have my wife tell me when she gets back". Wife is currently not in the lobby

## 2020-09-07 NOTE — ED Triage Notes (Signed)
Pt was seen at GI doctor today for blood work. Pt has hx of cirrhosis, and he has had worsening abdominal swelling, leg swelling, and confusion. Confusion for two days. Pt's wife states that his potassium was low.

## 2020-09-07 NOTE — ED Notes (Signed)
RN updated that pt was verbally aggressive during CT.

## 2020-09-07 NOTE — Progress Notes (Signed)
Brief note regarding preliminary plan, with full H&P to follow:  45 year old male with history of alcoholic cirrhosis status post TIPS procedure last year, who is admitted for progression of underlying cirrhosis in the setting of reported recent resumption of alcohol consumption, with elevated presenting serum alcohol level, after being instructed by his outpatient gastroenterologist to present to the ED for further evaluation and management of worsening hepatic function.  The EDP discussed the patient's case with the on-call gastroenterologist, Dr. Hilarie Fredrickson, Who will formally consult and plans to see the patient in the morning.      Babs Bertin, DO Hospitalist

## 2020-09-07 NOTE — ED Notes (Signed)
This nurse asked pt's wife if he had been drinking. Pt's wife stated that to her knowledge he has not drank in over a year, but she would like an Ethanol level checked.

## 2020-09-07 NOTE — H&P (Signed)
History and Physical    PLEASE NOTE THAT DRAGON DICTATION SOFTWARE WAS USED IN THE CONSTRUCTION OF THIS NOTE.   Shawn Martin MPN:[id number] DOB: Feb 02, 1976 DOA: 09/07/2020  PCP: [name], MD Patient coming from: home   I have personally briefly reviewed patient's old medical records in [place]  Chief Complaint: Confusion  HPI: Shawn Martin is a 44 y.o. male with medical history significant for alcoholic cirrhosis status post TIPS procedure in April 2021, allergic rhinitis, chronic hypokalemia, who is admitted to [facility] on 09/07/2020 with acute encephalopathy after presenting from home to [facility] ED for evaluation of altered mental status.   The setting of the patient's presenting altered mental status, history is provided by the patient's wife, in addition to my discussions with the emergency department physician and via chart review.  The patient has a documented history of chronic alcohol abuse resulting in cirrhosis for which she underwent TIPS procedure in April 2021.  Wife reports that the patient completely stopped all alcohol consumption immediately following TIPS procedure and as far as she is aware, the patient has remained abstinent to alcohol in the interval.  Per chart review, patient is prescribed lactulose 10 g p.o. twice daily, although wife is unsure as to the degree of patient's compliance with this is Lasix/spironolactone regimen in the context of portal hypertension.  However, in spite of TIPS procedure the patient has reportedly been at baseline mental status without confusion since time of TIPS procedure confused and somnolent relative to baseline mental status.  Wife does not believe the patient has experienced any recent trauma, and she does not think that the patient engages in any recreational drug use.  She notes that he had been without acute complaint the last week, during which he developed progressive worsening of bilateral  lower extremity edema as well as abdominal distention without overtly associated nominal pain.  Not associate with any diarrhea, melena, or hematochezia.  Not associate with any known subjective fever, chills, or rigors.  Wife does not believe that the patient has been experiencing recent acute respiratory symptoms, including no reported recent shortness of breath, cough, wheezing.  No recent traveling or rash.  No reported recent headache or neck stiffness and no known recent COVID-19 exposures.  He also does not live the patient has experienced any recent chest pain, diaphoresis, or palpitations.  In the setting of confusion as well as recent worsening of edema of the bilateral lower extremities and abdominal distention, the patient presented to the office of his outpatient gastroenterologist earlier today, with ensuing labs reportedly concerning for interval worsening of hepatic function, particularly in the setting of INR noted to be 1.4 relative to most recent prior value of 1.2 in May 2021.  As a consequence, gastroenterologist instructed the patient to present to the emergency department for further evaluation of the above.  Per chart review, it appears that the patient previously underwent recurrent therapeutic paracenteses leading up to his TIPS procedure, with this procedure numbering greater than 10 leading up to May 2021, but without documentation paracentesis following that time.     ED Course:  Vital signs in the ED were notable for the following: Tetramex 98.2, heart rate 85-1 12, blood pressure 140/70; respiratory rate 17-18, oxygen saturation 95 to 97% on room air.  Labs were notable for the following: CMP performed in the ED this evening was notable for the following: Sodium 140, potassium 3.0 relative to 3.5 in September 2021, chloride 97, bicarbonate  31, anion gap 12, BUN less than 5, creatinine 0.60 relative to most recent prior creatinine data point of 1.0 in September 2021, glucose  142, adjusted calcium 8.9.  This evening's liver enzymes, relative to most recent prior values obtained in September 2021 were notable for the following: This evening's albumin found to be 3.0 relative to prior value 3.6, alkaline phosphatase 172 compared to prior value of 208, AST 180 compared to 45, ALT 61, compared to prior value of 21, and total bilirubin found to be 4.6 this evening relative to most recent prior value of 1.5 in September 2021.  Serum ethanol found to be 491.  Ammonia level checked, with result currently pending.  Lactic acid 2.6.  CBC notable for the following: Blood cell count 7000, hemoglobin 13.4 with MCV 102, platelets 136.  Urinalysis has been ordered, with result currently pending.  Nasopharyngeal COVID-19/influenza PCR performed in the emergency department this evening and found to be negative.  EKG showed sinus rhythm with heart rate 99, and no evidence of T wave or ST changes, including no evidence of ST elevation.  Chest x-ray was sissy with poor inspiratory effort and was suggestive of minimal left pleural effusion, but otherwise demonstrated no evidence of acute cardiopulmonary process, including no evidence of infiltrate, edema, or pneumothorax.  Noncontrast CT of the head showed no evidence of acute intracranial process.  The EDP discussed the patient's case with the on-call gastroenterologist, Dr.Pyrtle, Who will formally consult and will see the patient in the morning, at which time he will provide.  In the interval, he recommends admission to the hospitalist service.  No interval recommendations for steroids.  While in the ED, the following were administered: Lactulose 30 g p.o. x1, potassium chloride 10 mill colons IV x1 dose.  Subsequently, the patient was admitted to the med telemetry floor for further evaluation and management of presenting acute encephalopathy as well as concern for deterioration of liver function in the setting of known underlying  cirrhosis.     Review of Systems: As per HPI otherwise 10 point review of systems negative.   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    Past Surgical History:  Procedure Laterality Date   IR PARACENTESIS  02/07/2019   IR PARACENTESIS  05/25/2019   IR PARACENTESIS  06/10/2019   IR PARACENTESIS  06/15/2019   IR PARACENTESIS  06/22/2019   IR PARACENTESIS  07/01/2019   IR PARACENTESIS  07/12/2019   IR PARACENTESIS  07/19/2019   IR PARACENTESIS  07/26/2019   IR RADIOLOGIST EVAL & MGMT  07/07/2019   IR RADIOLOGIST EVAL & MGMT  08/23/2019   IR RADIOLOGIST EVAL & MGMT  02/14/2020   IR THORACENTESIS ASP PLEURAL SPACE W/IMG GUIDE  07/26/2019   IR TIPS  07/26/2019   IR TRANSCATHETER BX  06/22/2019   IR US GUIDE VASC ACCESS RIGHT  06/22/2019   IR VENOGRAM HEPATIC WO HEMODYNAMIC EVALUATION  06/22/2019   NOSE SURGERY     RADIOLOGY WITH ANESTHESIA N/A 07/26/2019   Procedure: TIPS;  Surgeon: Aletta Edouard, MD;  Location: [facility];  Service: Radiology;  Laterality: N/A;    Social History:  reports that he has quit smoking. He quit smokeless tobacco use about 3 years ago.  His smokeless tobacco use included chew. He reports previous alcohol use. He  reports previous drug use.   Allergies  Allergen Reactions   Gluten Meal Diarrhea    bloating    Family History  Problem Relation Age of Onset   Healthy Mother    Hypertension Father    Heart attack Father    Leukemia Brother    Colon cancer Neg Hx    Stomach cancer Neg Hx    Pancreatic cancer Neg Hx     Family history reviewed and not pertinent    Prior to Admission medications   Medication Sig Start Date End Date Taking? Authorizing Provider  cetirizine (ZYRTEC) 10 MG tablet Take 10 mg by mouth daily.    [provider]  ferrous sulfate 325 (65 FE) MG tablet Take 325 mg by mouth  daily.    [provider]  furosemide (LASIX) 20 MG tablet Take 1 tablet (20 mg total) by mouth daily. 09/06/20   Armbruster, [name], MD  lactulose, encephalopathy, (CHRONULAC) 10 GM/15ML SOLN TAKE 15ML BY MOUTH TWICE DAILY PLEASE CALL YOUR GI DOCTOR IF YOU START TO FEEL CONFUSED. 09/07/20 09/07/21  Esterwood, Amy S, PA-C  Multiple Vitamin (MULTIVITAMIN WITH MINERALS) TABS tablet Take 1 tablet by mouth daily.    [provider]  oxyCODONE (ROXICODONE) 5 MG immediate release tablet Take 1 tablet (5 mg total) by mouth every 4 (four) hours as needed for moderate pain or severe pain (no more than 6 tabs daily). 08/22/20 08/22/21  Duffy Bruce, MD  potassium chloride SA (KLOR-CON) 20 MEQ tablet Take 1 tablet (20 mEq total) by mouth daily. 09/07/20   Armbruster, [name], MD  spironolactone (ALDACTONE) 50 MG tablet Take 1 tablet (50 mg total) by mouth daily. 09/06/20   Armbruster, [name], MD  Zinc 10 MG LOZG Use as directed in the mouth or throat.    [provider]     Objective    Physical Exam: Vitals:   09/07/20 1918 09/07/20 2038 09/07/20 2130  BP: (!) 154/105 (!) 137/119 140/70  Pulse: (!) 112 85 98  Resp: 18 17 18   Temp: 98.2 F (36.8 C)    TempSrc: Oral    SpO2: 94% 97% 95%    General: appears to be stated age; somnolent, confused;  Skin: warm, dry, no rash Head:  AT/[place] Mouth:  Oral mucosa membranes appear moist, normal dentition Neck: supple; trachea midline Heart:  RRR; did not appreciate any M/R/G Lungs: CTAB, did not appreciate any wheezes, rales, or rhonchi Abdomen: + BS; soft, distended abdomen without overt tenderness to palpation.  Vascular: 2+ pedal pulses b/l; 2+ radial pulses b/l Extremities: 2+ edema in b/l LE's; no muscle wasting Neuro: In the setting of the patient's current mental status and associated inability to follow instructions, unable to perform full neurologic exam at this time.  As such, assessment of strength, sensation, and  cranial nerves is limited at this time. Patient noted to spontaneously move all 4 extremities. No tremors.     Labs on Admission: I have personally reviewed following labs and imaging studies  CBC: Recent Labs  Lab 09/07/20 1001 09/07/20 1934  WBC 3.3* 7.0  NEUTROABS 1.5 1.8  HGB 13.3 13.4  HCT 37.5* 38.7*  MCV 103.5* 102.1*  PLT 131.0* 151*   Basic Metabolic Panel: Recent Labs  Lab 09/07/20 1001 09/07/20 1934  NA 144 140  K 3.0* 3.0*  CL 101 97*  CO2 32 31  GLUCOSE 150* 142*  BUN 4* <5*  CREATININE 0.58 0.60*  CALCIUM 8.1* 8.3*  GFR: CrCl cannot be calculated (Unknown ideal weight.). Liver Function Tests: Recent Labs  Lab 09/07/20 1001 09/07/20 1934  AST 154* 180*  ALT 55* 61*  ALKPHOS 174* 172*  BILITOT 4.9* 4.6*  PROT 7.1 7.5  ALBUMIN 3.2* 3.0*   No results for input(s): LIPASE, AMYLASE in the last 168 hours. No results for input(s): AMMONIA in the last 168 hours. Coagulation Profile: Recent Labs  Lab 09/07/20 1001  INR 1.4*   Cardiac Enzymes: No results for input(s): CKTOTAL, CKMB, CKMBINDEX, TROPONINI in the last 168 hours. BNP (last 3 results) No results for input(s): PROBNP in the last 8760 hours. HbA1C: No results for input(s): HGBA1C in the last 72 hours. CBG: No results for input(s): GLUCAP in the last 168 hours. Lipid Profile: No results for input(s): CHOL, HDL, LDLCALC, TRIG, CHOLHDL, LDLDIRECT in the last 72 hours. Thyroid Function Tests: No results for input(s): TSH, T4TOTAL, FREET4, T3FREE, THYROIDAB in the last 72 hours. Anemia Panel: No results for input(s): VITAMINB12, FOLATE, FERRITIN, TIBC, IRON, RETICCTPCT in the last 72 hours. Urine analysis:    Component Value Date/Time   COLORURINE YELLOW 07/01/2019 1224   APPEARANCEUR CLEAR 07/01/2019 1224   LABSPEC 1.019 07/01/2019 1224   PHURINE 6.0 07/01/2019 1224   GLUCOSEU NEGATIVE 07/01/2019 1224   HGBUR NEGATIVE 07/01/2019 1224   BILIRUBINUR NEGATIVE 07/01/2019 1224    KETONESUR 5 (A) 07/01/2019 1224   PROTEINUR NEGATIVE 07/01/2019 1224   NITRITE NEGATIVE 07/01/2019 1224   LEUKOCYTESUR NEGATIVE 07/01/2019 1224    Radiological Exams on Admission: DG Chest 2 View  Result Date: 09/07/2020 CLINICAL DATA:  Confusion EXAM: CHEST - 2 VIEW COMPARISON:  07/30/2019 FINDINGS: Cardiac shadow is mildly prominent but accentuated by the portable technique. The overall inspiratory effort is poor. Minimal blunting of the left costophrenic angle is noted consistent with small effusion. No focal infiltrate is noted. No bony abnormality is seen. IMPRESSION: Minimal left pleural effusion.  No other focal abnormality is noted. Electronically Signed   By: Inez Catalina M.D.   On: 09/07/2020 20:30   CT HEAD WO CONTRAST  Result Date: 09/07/2020 CLINICAL DATA:  Altered mental status EXAM: CT HEAD WITHOUT CONTRAST TECHNIQUE: Contiguous axial images were obtained from the base of the skull through the vertex without intravenous contrast. COMPARISON:  None. FINDINGS: Brain: Image quality is degraded by motion. Mild cerebral atrophy. No acute intracranial abnormality. Specifically, no hemorrhage, hydrocephalus, mass lesion, acute infarction, or significant intracranial injury. Vascular: No hyperdense vessel or unexpected calcification. Skull: No acute calvarial abnormality. Sinuses/Orbits: No acute findings Other: None IMPRESSION: Mild image quality degradation due to motion. No visible acute intracranial abnormality. Electronically Signed   By: Rolm Baptise M.D.   On: 09/07/2020 20:38     EKG: Independently reviewed, with result as described above.    Assessment/Plan   In the setting of the patient's current mental status and associated inability to follow instructions, unable to perform full neurologic exam at this time.  As such, assessment of strength, sensation, and cranial nerves is limited at this time. Patient noted to spontaneously move all 4 extremities. No tremors.     Principal Problem:   Acute encephalopathy Active Problems:   Cirrhosis ([place])   Acute alcohol intoxication ([place])   Alcohol abuse with intoxication ([place])   Lactic acidosis   Hypokalemia   Allergic rhinitis     #) Acute encephalopathy: Reported worsening confusion and somnolence over the course of the last few days relative to baseline mental status and activity level.  Suspect that this is multifactorial in nature, with suspected acute contribution from evidence of acute alcohol intoxication, with presenting serum ethanol level found to be nearly 500. While representing a diagnosis of exclusion differential also includes acute hepatic encephalopathy in the setting of history of cirrhosis, with laboratory evidence findings to suggest recent worsening of hepatic function in the context of recent resumption of alcohol consumption and unclear, but suspected suboptimal compliance with home lactulose as well as for associated portal hypertension.  The patient is particularly susceptible for development of hepatic encephalopathy TIPS procedure that occurred in April 2021 given bypass of hepatic metabolism.  While the patient reportedly has developed recent worsening of abdominal distention this appears to be more consistent with aggressive volume overload in the context of worsening hepatic function, with no clinical evidence to suggest SBP at this time, particularly in the absence of any associated abdominal discomfort and no evidence to suggest sepsis.  We will closely monitor clinical trend evidence to suggest SBP.  No other source of underlying infection identified at this time, including chest x-ray which showed no evidence of acute cardiopulmonary process, including no evidence of pneumonia, while COVID-19/influenza PCR performed today were found to be negative.  Urinalysis has been ordered, with result currently pending.  Presentation less suggestive of meningitis.  Of note, patient's home dose of  lactulose is reported to be a 10 g p.o. twice daily, and the patient received lactulose 30 g p.o. x1 in the ED this evening.  Furthermore, noncontrast CT of the head shows no evidence of acute process.  Plan: Further evaluation management of acute alcohol intoxication, as further detailed below.  Assess compliance with lactulose clear, will escalate lactulose dose to 20 g p.o. 3 times daily.  Resume home spironolactone/Lasix regimen.  Follow-up results of urinalysis.  Check MMA, TSH, and urinary drug screen.  Repeat INR in the morning.  Further evaluation and management of cirrhosis, with concern for further decompensation of hepatic function, as further detailed below, including formal gastroenterology consult.  Thiamine administration, further detailed below.  Nursing bedside swallow screen has been ordered prior to initiation of diet.      #) Alcoholic cirrhosis: Documented history of such, status post TIPS procedure in April 2021 from with evidence of acute to subacute decompensation of underlying hepatic function on the basis of report of recent progression of edema in the bilateral lower extremities, worsening abdominal distention, as well as evidence to suggest worsening of hepatic synthetic function on the basis of finding of INR of 1.4 performed earlier today relative to most recent prior value of 1.2 in May 2021.  Suspect contribution to worsening hepatic function on the basis of apparent recent resumption alcohol consumption, with presenting serum ethanol level noted to be almost 500.  Given suspected recent resumption differential also includes acute alcoholic hepatitis given interval increase in transaminases total bilirubin, and recent worsening of confusion.  Of note, potential framework, discriminant function calculated to be 15.2, thereby not meeting criteria for empiric initiation of systemic corticosteroids. Dr. Hilarie Fredrickson of GI formally consulted, and with additional recommendations to occur  at that time.  Of note, meld score calculated to be 16.  Of note, in the setting of associated portal hypertension, the patient is prescribed spironolactone and Lasix in a 5:2 ratio, although compliance with these diuretic medications is currently .   Plan: Resumption of spironolactone/Lasix regimen.  Monitor strict I's and O's and daily weights.  Lactulose, as above.  Follow for result of serum ammonia level.  Repeat CMP in the morning.  Check direct bilirubin.  Repeat INR in the morning as well.  Repeat CBC in the morning.  Add on serum magnesium level.  Formal gastroenterology consult, as above.  As patient's mental status improves, anticipate counseling of regarding the importance of elimination of alcohol consumption.  Check urinary drug screen.      #) Acute alcohol intoxication: In the setting of history of chronic alcohol abuse leading to alcoholic cirrhosis, the patient reportedly has been abstinent of alcohol for last 14 months.  However, in the setting of presenting acute encephalopathy, serum ethanol level found to be elevated to nearly 500.  Unclear timing of most recent alcohol consumption.  This elevated serum ethanol level, within an associated metabolism given current state of function, it appears unlikely that the patient would be capable of developing this over the next day.  However, we will closely monitor for development of such, with plan for initiation of CIWA protocol at that point.  Close interval monitoring of vital signs, as well as close monitoring of electrolytes, as further detailed below.  In the context of volume overload in the setting of worsening hepatic function, will refrain from an antibiotic for now, but rather provide thiamine, folic acid, and multivitamin supplementation via PO route, as further detailed below   Plan: Thiamine 100 mg IV x1 now , followed by thiamine 100 mg p.o. daily.  Daily oral folic acid supplementation.  Daily multivitamin.  Anticipate  subsequent counseling on the importance of reduction in alcohol consumption. Consider consult to transition of care team placed. Close monitoring of ensuing BP and HR via routine VS. seizure precautions ordered.  On telemetry.  Add on serum magnesium level.  And check serum phosphorus level.  Repeat CMP in the morning.  Check INR.       #) Lactic acidosis: Presenting lactate noted to be mildly elevated at 2.6.  Suspect that this elevation is multifactorial in etiology, with suspected contribution stemming from hepatic insufficiency as evidenced by a worsening/elevating INR values, in addition to alcoholic keto lactic acidosis.  No evidence to suggest underlying infectious process at this time, and SIRS criteria not currently met.  We will further evaluate for any underlying infectious process with urinalysis.  Of note, COVID-19 PCR found to be negative, while chest x-ray shows no evidence of underlying pneumonia.  Perceived volume overload status in the absence of any underlying infectious process, will refrain from pursuit of aggressive IV as management of elevated lactate.  Rather, will proceed with further evaluation and management of suspected main contributing factors, which include worsening hepatic function in the setting of alcoholic cirrhosis as well as alcohol abuse, as further detailed above.  No indication for empiric antibiotic initiation at this time, including no evidence to warrant empiric SBP coverage at the present.  Plan: Further evaluation management of cirrhosis, as above.  Further evaluation management of acute alcohol intoxication alcohol abuse, as further detailed above.  Follow for result urinalysis.  Repeat CBC in the morning.  Recheck INR in the morning as well.  Add on salicylate level.  Check urinary drug screen.  Repeat lactic acid level in the morning.  CMP in the morning.      #) Hypokalemia: In the setting of a documented history of chronic hypokalemia, presenting  serum potassium found to be mildly low at 3.0.  Of note, the patient received KCl 10 mEq IV over 1 hour x 1 in the ED this evening.  We will pursue  additional supplementation via oral KCl, as further quantified below.  Plan: Potassium chloride 3 mEq p.o. x1.  Add on serum magnesium level.  Monitor on telemetry.  Repeat CMP in the morning.  Resumption of home spironolactone.      #) Allergic rhinitis: Documented history of such.  On scheduled Zyrtec as an outpatient.  In the setting of presenting acute encephalopathy, will hold home Zyrtec for now to eliminate potential confusion associated anticholinergic side effects of this medication.  Plan: Hold home Zyrtec for now, as above.      DVT prophylaxis: SCDs Code Status: Full code Disposition Plan: Per Rounding Team;  Consults called: on-call gastroenterologist, Dr. Hilarie Fredrickson, consulted, as further detailed above.  Admission status: Inpatient; med telemetry     Of note, this patient was added by me to the following Admit List/Treatment Team: [email or web address].      PLEASE NOTE THAT DRAGON DICTATION SOFTWARE WAS USED IN THE CONSTRUCTION OF THIS NOTE.   [facility] Pager [phone] From [place]  Otherwise, please contact night-coverage  [email or web address] Password [facility]   09/07/2020, 9:50 PM

## 2020-09-07 NOTE — ED Notes (Signed)
Critical Result Time: 2027 Test: Lactic Acid  Result: 2.6 MD notified: Tyrone Nine, MD Action: Awaiting Orders

## 2020-09-07 NOTE — ED Provider Notes (Signed)
[facility] DEPT Provider Note   CSN: [id number] Arrival date & time: 09/07/20  7711     History Chief Complaint  Patient presents with   Altered Mental Status    Shawn Martin is a 45 y.o. male.  45 yo M with a chief complaints of abdominal distention and lower extremity edema.  Going on for a few days now.  Family found him acutely confused as well.  He had been seen this morning by his gastroenterology office to have lab work drawn with the worsening leg swelling and stiffness found to have LFT elevation and elevation of his INR and they had encouraged him to come to the ED for evaluation and likely decompensated cirrhosis.  Patient denies any fevers or chills.  Does feel a bit uncomfortable in his legs.  Denies trauma.  Patient is confused on arrival level 5 caveat.  The history is provided by the patient.  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    Patient Active Problem List   Diagnosis Date Noted   Cirrhosis ([place]) 07/26/2019   Acute respiratory failure with hypoxia ([place]) 07/26/2019   Hyperkalemia 06/30/2019   AKI (acute kidney injury) ([place]) 06/30/2019   Hyponatremia 06/03/2019   Alcohol dependence ([place]) 06/03/2019   Symptomatic anemia 06/03/2019   Ascites due to alcoholic cirrhosis ([place]) 65/79/0383   Alcoholic cirrhosis of liver with ascites ([place]) 01/28/2019   Screening for viral disease 01/28/2019    Past Surgical History:   Procedure Laterality Date   IR PARACENTESIS  02/07/2019   IR PARACENTESIS  05/25/2019   IR PARACENTESIS  06/10/2019   IR PARACENTESIS  06/15/2019   IR PARACENTESIS  06/22/2019   IR PARACENTESIS  07/01/2019   IR PARACENTESIS  07/12/2019   IR PARACENTESIS  07/19/2019   IR PARACENTESIS  07/26/2019   IR RADIOLOGIST EVAL & MGMT  07/07/2019   IR RADIOLOGIST EVAL & MGMT  08/23/2019   IR RADIOLOGIST EVAL & MGMT  02/14/2020   IR THORACENTESIS ASP PLEURAL SPACE W/IMG GUIDE  07/26/2019   IR TIPS  07/26/2019   IR TRANSCATHETER BX  06/22/2019   IR US GUIDE VASC ACCESS RIGHT  06/22/2019   IR VENOGRAM HEPATIC WO HEMODYNAMIC EVALUATION  06/22/2019   NOSE SURGERY     RADIOLOGY WITH ANESTHESIA N/A 07/26/2019   Procedure: TIPS;  Surgeon: Aletta Edouard, MD;  Location: [facility];  Service: Radiology;  Laterality: N/A;       Family History  Problem Relation Age of Onset   Healthy Mother    Hypertension Father    Heart attack Father    Leukemia Brother    Colon cancer Neg Hx    Stomach cancer Neg Hx    Pancreatic cancer Neg Hx     Social History   Tobacco Use   Smoking status: Former    Pack years: 0.00   Smokeless tobacco: Former    Types: Chew    Quit date:  03/2017  Vaping Use   Vaping Use: Never used  Substance Use Topics   Alcohol use: Not Currently    Comment: every day drinker, 6 pack per day   Drug use: Not Currently    Home Medications Prior to Admission medications   Medication Sig Start Date End Date Taking? Authorizing Provider  cetirizine (ZYRTEC) 10 MG tablet Take 10 mg by mouth daily.    [provider]  ferrous sulfate 325 (65 FE) MG tablet Take 325 mg by mouth daily.    [provider]  furosemide (LASIX) 20 MG tablet Take 1 tablet (20 mg total) by mouth daily. 09/06/20   Armbruster, [name], MD  lactulose, encephalopathy, (CHRONULAC) 10 GM/15ML SOLN TAKE 15ML BY MOUTH TWICE DAILY PLEASE CALL YOUR GI DOCTOR IF YOU START TO FEEL CONFUSED. 09/07/20 09/07/21  Esterwood,  Amy S, PA-C  Multiple Vitamin (MULTIVITAMIN WITH MINERALS) TABS tablet Take 1 tablet by mouth daily.    [provider]  oxyCODONE (ROXICODONE) 5 MG immediate release tablet Take 1 tablet (5 mg total) by mouth every 4 (four) hours as needed for moderate pain or severe pain (no more than 6 tabs daily). 08/22/20 08/22/21  Duffy Bruce, MD  potassium chloride SA (KLOR-CON) 20 MEQ tablet Take 1 tablet (20 mEq total) by mouth daily. 09/07/20   Armbruster, [name], MD  spironolactone (ALDACTONE) 50 MG tablet Take 1 tablet (50 mg total) by mouth daily. 09/06/20   Armbruster, [name], MD  Zinc 10 MG LOZG Use as directed in the mouth or throat.    [provider]    Allergies    Gluten meal  Review of Systems   Review of Systems  Unable to perform ROS: Mental status change  Constitutional:  Negative for chills and fever.  HENT:  Negative for congestion and facial swelling.   Eyes:  Negative for discharge and visual disturbance.  Respiratory:  Negative for shortness of breath.   Cardiovascular:  Positive for leg swelling. Negative for chest pain and palpitations.  Gastrointestinal:  Positive for abdominal distention. Negative for abdominal pain, diarrhea and vomiting.  Musculoskeletal:  Negative for arthralgias and myalgias.  Skin:  Negative for color change and rash.  Neurological:  Negative for tremors, syncope and headaches.  Psychiatric/Behavioral:  Negative for confusion and dysphoric mood.    Physical Exam Updated Vital Signs BP (!) 137/119   Pulse 98   Temp 98.2 F (36.8 C) (Oral)   Resp (!) 9   SpO2 95%   Physical Exam Vitals and nursing note reviewed.  Constitutional:      Appearance: He is well-developed.  HENT:     Head: Normocephalic and atraumatic.  Eyes:     Pupils: Pupils are equal, round, and reactive to light.  Neck:     Vascular: No JVD.  Cardiovascular:     Rate and Rhythm: Normal rate and regular rhythm.     Heart sounds: No murmur heard.    No friction rub. No gallop.  Pulmonary:     Effort: No respiratory distress.     Breath sounds: No wheezing.  Abdominal:     General: There is distension.     Tenderness: There is no abdominal tenderness. There is no guarding or rebound.     Comments: Distention with positive fluid wave.  Musculoskeletal:        General: Normal range of motion.     Cervical back: Normal range of motion and neck supple.  Right lower leg: Edema present.     Left lower leg: Edema present.     Comments: 4+ edema up to the thighs.  Skin:    Coloration: Skin is not pale.     Findings: No rash.  Neurological:     Mental Status: He is alert and oriented to person, place, and time.  Psychiatric:        Behavior: Behavior normal.    ED Results / Procedures / Treatments   Labs (all labs ordered are listed, but only abnormal results are displayed) Labs Reviewed  COMPREHENSIVE METABOLIC PANEL - Abnormal; Notable for the following components:      Result Value   Potassium 3.0 (*)    Chloride 97 (*)    Glucose, Bld 142 (*)    BUN <5 (*)    Creatinine, Ser 0.60 (*)    Calcium 8.3 (*)    Albumin 3.0 (*)    AST 180 (*)    ALT 61 (*)    Alkaline Phosphatase 172 (*)    Total Bilirubin 4.6 (*)    All other components within normal limits  LACTIC ACID, PLASMA - Abnormal; Notable for the following components:   Lactic Acid, Venous 2.6 (*)    All other components within normal limits  BLOOD GAS, VENOUS - Abnormal; Notable for the following components:   pH, Ven 7.445 (*)    pO2, Ven 51.6 (*)    Bicarbonate 34.3 (*)    Acid-Base Excess 9.0 (*)    All other components within normal limits  ETHANOL - Abnormal; Notable for the following components:   Alcohol, Ethyl (B) 491 (*)    All other components within normal limits  CBC WITH DIFFERENTIAL/PLATELET - Abnormal; Notable for the following components:   RBC 3.79 (*)    HCT 38.7 (*)    MCV 102.1 (*)    MCH 35.4 (*)    RDW 17.0 (*)    Platelets 136  (*)    Basophils Absolute 0.2 (*)    All other components within normal limits  CULTURE, BLOOD (ROUTINE X 2)  CULTURE, BLOOD (ROUTINE X 2)  URINALYSIS, ROUTINE W REFLEX MICROSCOPIC  AMMONIA  MAGNESIUM    EKG EKG Interpretation  Date/Time:  Friday September 07 2020 19:55:06 EDT Ventricular Rate:  99 PR Interval:  167 QRS Duration: 99 QT Interval:  383 QTC Calculation: 492 R Axis:   46 Text Interpretation: Sinus rhythm Abnormal R-wave progression, early transition Left ventricular hypertrophy Anterior Q waves, possibly due to LVH Nonspecific T abnormalities, inferior leads wavering baseline TECHNICALLY DIFFICULT Otherwise no significant change Confirmed by Deno Etienne [id number]) on 09/07/2020 8:31:34 PM  Radiology DG Chest 2 View  Result Date: 09/07/2020 CLINICAL DATA:  Confusion EXAM: CHEST - 2 VIEW COMPARISON:  07/30/2019 FINDINGS: Cardiac shadow is mildly prominent but accentuated by the portable technique. The overall inspiratory effort is poor. Minimal blunting of the left costophrenic angle is noted consistent with small effusion. No focal infiltrate is noted. No bony abnormality is seen. IMPRESSION: Minimal left pleural effusion.  No other focal abnormality is noted. Electronically Signed   By: Inez Catalina M.D.   On: 09/07/2020 20:30   CT HEAD WO CONTRAST  Result Date: 09/07/2020 CLINICAL DATA:  Altered mental status EXAM: CT HEAD WITHOUT CONTRAST TECHNIQUE: Contiguous axial images were obtained from the base of the skull through the vertex without intravenous contrast. COMPARISON:  None. FINDINGS: Brain: Image quality is degraded by motion. Mild cerebral atrophy. No acute intracranial  abnormality. Specifically, no hemorrhage, hydrocephalus, mass lesion, acute infarction, or significant intracranial injury. Vascular: No hyperdense vessel or unexpected calcification. Skull: No acute calvarial abnormality. Sinuses/Orbits: No acute findings Other: None IMPRESSION: Mild image quality degradation  due to motion. No visible acute intracranial abnormality. Electronically Signed   By: Rolm Baptise M.D.   On: 09/07/2020 20:38    Procedures Procedures   Medications Ordered in ED Medications  potassium chloride 10 mEq in 100 mL IVPB (has no administration in time range)  lactulose (CHRONULAC) 10 GM/15ML solution 30 g (30 g Oral Given 09/07/20 2058)    ED Course  I have reviewed the triage vital signs and the nursing notes.  Pertinent labs & imaging results that were available during my care of the patient were reviewed by me and considered in my medical decision making (see chart for details).    MDM Rules/Calculators/A&P                          45 yo M 9 with a chief complaints of lower extremity edema and abdominal swelling.  Patient had a TIPS procedure performed and has not had any issues since per the family.  Not sure why things are getting worse.  I discussed the case with Dr. Hilarie Fredrickson, gastroenterology recommended admission and they would see him in the morning.  Alcohol level is quite elevated likely the cause of his recurrence.  Chest x-ray with small pleural effusion.  CT of the head without acute finding.  Mild hypokalemia we will give a dose of potassium here check a mag level.  Will discuss with hospitalist.  The patients results and plan were reviewed and discussed.   Any x-rays performed were independently reviewed by myself.   Differential diagnosis were considered with the presenting HPI.  Medications  potassium chloride 10 mEq in 100 mL IVPB (has no administration in time range)  lactulose (CHRONULAC) 10 GM/15ML solution 30 g (30 g Oral Given 09/07/20 2058)    Vitals:   09/07/20 1918 09/07/20 2038 09/07/20 2130  BP: (!) 154/105 (!) 137/119   Pulse: (!) 112 85 98  Resp: 18 17 (!) 9  Temp: 98.2 F (36.8 C)    TempSrc: Oral    SpO2: 94% 97% 95%    Final diagnoses:  Alcoholic cirrhosis of liver with ascites (HCC)  Alcoholic intoxication without complication  (HCC)    Admission/ observation were discussed with the admitting physician, patient and/or family and they are comfortable with the plan.   Final Clinical Impression(s) / ED Diagnoses Final diagnoses:  Alcoholic cirrhosis of liver with ascites ([place])  Alcoholic intoxication without complication [facility])    Rx / DC Orders ED Discharge Orders     None        Deno Etienne, DO 09/07/20 2131

## 2020-09-07 NOTE — ED Notes (Signed)
Pt transported to xray 

## 2020-09-07 NOTE — ED Notes (Signed)
Pharmacy requested to send lactulose because adequate dose isn't stocked on floor.

## 2020-09-08 ENCOUNTER — Inpatient Hospital Stay (HOSPITAL_COMMUNITY): Payer: BC Managed Care – PPO

## 2020-09-08 DIAGNOSIS — F10129 Alcohol abuse with intoxication, unspecified: Secondary | ICD-10-CM | POA: Diagnosis present

## 2020-09-08 DIAGNOSIS — F10929 Alcohol use, unspecified with intoxication, unspecified: Secondary | ICD-10-CM | POA: Diagnosis present

## 2020-09-08 DIAGNOSIS — G9340 Encephalopathy, unspecified: Secondary | ICD-10-CM | POA: Diagnosis present

## 2020-09-08 DIAGNOSIS — E872 Acidosis, unspecified: Secondary | ICD-10-CM | POA: Diagnosis present

## 2020-09-08 DIAGNOSIS — E876 Hypokalemia: Secondary | ICD-10-CM | POA: Diagnosis present

## 2020-09-08 DIAGNOSIS — J309 Allergic rhinitis, unspecified: Secondary | ICD-10-CM | POA: Diagnosis present

## 2020-09-08 DIAGNOSIS — Z95828 Presence of other vascular implants and grafts: Secondary | ICD-10-CM

## 2020-09-08 LAB — CBC WITH DIFFERENTIAL/PLATELET
Abs Immature Granulocytes: 0.01 10*3/uL (ref 0.00–0.07)
Basophils Absolute: 0.1 10*3/uL (ref 0.0–0.1)
Basophils Relative: 3 %
Eosinophils Absolute: 0.1 10*3/uL (ref 0.0–0.5)
Eosinophils Relative: 4 %
HCT: 32.9 % — ABNORMAL LOW (ref 39.0–52.0)
Hemoglobin: 11.4 g/dL — ABNORMAL LOW (ref 13.0–17.0)
Immature Granulocytes: 0 %
Lymphocytes Relative: 44 %
Lymphs Abs: 1.3 10*3/uL (ref 0.7–4.0)
MCH: 35.3 pg — ABNORMAL HIGH (ref 26.0–34.0)
MCHC: 34.7 g/dL (ref 30.0–36.0)
MCV: 101.9 fL — ABNORMAL HIGH (ref 80.0–100.0)
Monocytes Absolute: 0.4 10*3/uL (ref 0.1–1.0)
Monocytes Relative: 15 %
Neutro Abs: 1 10*3/uL — ABNORMAL LOW (ref 1.7–7.7)
Neutrophils Relative %: 34 %
Platelets: 117 10*3/uL — ABNORMAL LOW (ref 150–400)
RBC: 3.23 MIL/uL — ABNORMAL LOW (ref 4.22–5.81)
RDW: 17.1 % — ABNORMAL HIGH (ref 11.5–15.5)
WBC: 3 10*3/uL — ABNORMAL LOW (ref 4.0–10.5)
nRBC: 0 % (ref 0.0–0.2)

## 2020-09-08 LAB — PROTIME-INR
INR: 1.5 — ABNORMAL HIGH (ref 0.8–1.2)
Prothrombin Time: 17.8 seconds — ABNORMAL HIGH (ref 11.4–15.2)

## 2020-09-08 LAB — COMPREHENSIVE METABOLIC PANEL
ALT: 51 U/L — ABNORMAL HIGH (ref 0–44)
AST: 159 U/L — ABNORMAL HIGH (ref 15–41)
Albumin: 2.5 g/dL — ABNORMAL LOW (ref 3.5–5.0)
Alkaline Phosphatase: 139 U/L — ABNORMAL HIGH (ref 38–126)
Anion gap: 10 (ref 5–15)
BUN: <5 mg/dL — ABNORMAL LOW (ref 6–20)
CO2: 32 mmol/L (ref 22–32)
Calcium: 7.4 mg/dL — ABNORMAL LOW (ref 8.9–10.3)
Chloride: 100 mmol/L (ref 98–111)
Creatinine, Ser: 0.44 mg/dL — ABNORMAL LOW (ref 0.61–1.24)
GFR, Estimated: >60 mL/min (ref >60)
Glucose, Bld: 136 mg/dL — ABNORMAL HIGH (ref 70–99)
Potassium: 3 mmol/L — ABNORMAL LOW (ref 3.5–5.1)
Sodium: 142 mmol/L (ref 135–145)
Total Bilirubin: 4.1 mg/dL — ABNORMAL HIGH (ref 0.3–1.2)
Total Protein: 6 g/dL — ABNORMAL LOW (ref 6.5–8.1)

## 2020-09-08 LAB — TSH: TSH: 2.201 u[IU]/mL (ref 0.350–4.500)

## 2020-09-08 LAB — PHOSPHORUS: Phosphorus: 3.4 mg/dL (ref 2.5–4.6)

## 2020-09-08 LAB — LACTIC ACID, PLASMA: Lactic Acid, Venous: 2.8 mmol/L (ref 0.5–1.9)

## 2020-09-08 LAB — SALICYLATE LEVEL: Salicylate Lvl: <7 mg/dL — ABNORMAL LOW (ref 7.0–30.0)

## 2020-09-08 LAB — MAGNESIUM: Magnesium: 1.7 mg/dL (ref 1.7–2.4)

## 2020-09-08 LAB — BILIRUBIN, DIRECT: Bilirubin, Direct: 1.9 mg/dL — ABNORMAL HIGH (ref 0.0–0.2)

## 2020-09-08 MED ORDER — FUROSEMIDE 40 MG PO TABS
40.0000 mg | ORAL_TABLET | Freq: Every day | ORAL | Status: DC
  Administered 2020-09-09 – 2020-09-11 (×3): 40 mg via ORAL
  Filled 2020-09-08 (×3): qty 1

## 2020-09-08 MED ORDER — SPIRONOLACTONE 100 MG PO TABS
100.0000 mg | ORAL_TABLET | Freq: Every day | ORAL | Status: DC
  Administered 2020-09-09 – 2020-09-11 (×3): 100 mg via ORAL
  Filled 2020-09-08 (×3): qty 1

## 2020-09-08 MED ORDER — SPIRONOLACTONE 25 MG PO TABS
50.0000 mg | ORAL_TABLET | Freq: Every day | ORAL | Status: DC
  Administered 2020-09-08: 50 mg via ORAL
  Filled 2020-09-08: qty 2

## 2020-09-08 MED ORDER — LACTULOSE 10 GM/15ML PO SOLN
20.0000 g | Freq: Three times a day (TID) | ORAL | Status: DC
  Administered 2020-09-08 (×3): 20 g via ORAL
  Filled 2020-09-08 (×3): qty 30

## 2020-09-08 MED ORDER — SPIRONOLACTONE 25 MG PO TABS
50.0000 mg | ORAL_TABLET | Freq: Once | ORAL | Status: DC
  Filled 2020-09-08: qty 2

## 2020-09-08 MED ORDER — ADULT MULTIVITAMIN W/MINERALS CH
1.0000 | ORAL_TABLET | Freq: Every day | ORAL | Status: DC
  Administered 2020-09-08 – 2020-09-10 (×3): 1 via ORAL
  Filled 2020-09-08 (×4): qty 1

## 2020-09-08 MED ORDER — THIAMINE HCL 100 MG/ML IJ SOLN
100.0000 mg | Freq: Once | INTRAMUSCULAR | Status: AC
  Administered 2020-09-08: 100 mg via INTRAVENOUS
  Filled 2020-09-08: qty 2

## 2020-09-08 MED ORDER — POTASSIUM CHLORIDE CRYS ER 20 MEQ PO TBCR
30.0000 meq | EXTENDED_RELEASE_TABLET | Freq: Once | ORAL | Status: AC
  Administered 2020-09-08: 30 meq via ORAL
  Filled 2020-09-08: qty 1

## 2020-09-08 MED ORDER — FUROSEMIDE 40 MG PO TABS
20.0000 mg | ORAL_TABLET | Freq: Once | ORAL | Status: AC
  Administered 2020-09-08: 20 mg via ORAL
  Filled 2020-09-08: qty 1

## 2020-09-08 MED ORDER — THIAMINE HCL 100 MG PO TABS
100.0000 mg | ORAL_TABLET | Freq: Every day | ORAL | Status: DC
  Administered 2020-09-08 – 2020-09-10 (×3): 100 mg via ORAL
  Filled 2020-09-08 (×5): qty 1

## 2020-09-08 MED ORDER — FUROSEMIDE 40 MG PO TABS
20.0000 mg | ORAL_TABLET | Freq: Every day | ORAL | Status: DC
  Administered 2020-09-08: 20 mg via ORAL
  Filled 2020-09-08: qty 1

## 2020-09-08 MED ORDER — FOLIC ACID 1 MG PO TABS
1.0000 mg | ORAL_TABLET | Freq: Every day | ORAL | Status: DC
  Administered 2020-09-08 – 2020-09-10 (×3): 1 mg via ORAL
  Filled 2020-09-08 (×4): qty 1

## 2020-09-08 MED ORDER — POTASSIUM CHLORIDE 10 MEQ/100ML IV SOLN
10.0000 meq | INTRAVENOUS | Status: AC
  Administered 2020-09-08 (×3): 10 meq via INTRAVENOUS
  Filled 2020-09-08 (×3): qty 100

## 2020-09-08 NOTE — [place] Notes (Signed)
PROGRESS NOTE    Shawn Martin  BSJ:[id number] DOB: Dec 14, 1975 DOA: 09/07/2020 PCP: [name], MD   Brief Narrative:  This 45 y.o. male with PMH significant for alcoholic cirrhosis status post TIPS procedure in April 2021, allergic rhinitis, chronic hypokalemia, who is admitted with acute encephalopathy after presenting from home for the evaluation of altered mental status.  Wife reports that the patient completely stopped all alcohol consumption immediately following TIPS procedure and as far as she is aware, the patient has remained abstinent to alcohol in the interval. Patient has been noncompliant with lactulose and diuretic regimen. GI was consulted, Patient was given lactulose.  Acute encephalopathy has improved.  Assessment & Plan:   Principal Problem:   Acute encephalopathy Active Problems:   Cirrhosis ([place])   Acute alcohol intoxication ([place])   Alcohol abuse with intoxication ([place])   Lactic acidosis   Hypokalemia   Allergic rhinitis  Acute encephalopathy: Suspect this is multifactorial in nature. It appears like patient has resumed drinking alcohol. Serum ETOH level found to be 500.   Hepatic encaph due to noncompliance with lactulose. GI consulted recommended follow-up liver Doppler. Continue diuretics continue lactulose low-sodium diet. Patient is alert and oriented back to his baseline mental status.  Alcoholic cirrhosis: Patient underwent TIPS procedure in April 2021. Continue to drink alcohol.  Noncompliant with his medication regimen. Resume Lasix and spironolactone regimen.  Alcohol intoxication: Continue thiamine and folic acid. Continue CIWA protocol.  Lactic acidosis: No source of infection found.  This could be multifactorial. This could be due to alcohol intoxication.   DVT prophylaxis:  Code Status:  Full code. Family Communication:  No family at bed side. Disposition Plan:   Status is: Inpatient  Remains inpatient appropriate  because:Inpatient level of care appropriate due to severity of illness  Dispo: The patient is from: Home              Anticipated d/c is to: Home              Patient currently is not medically stable to d/c.   Difficult to place patient No  Consultants:  GI  Procedures:  Antimicrobials:  Anti-infectives (From admission, onward)    None        Subjective: Patient was seen and examined at bedside.  Overnight events noted.  Patient reports feeling better .   He is alert and oriented , back to his baseline mental status.  Objective: Vitals:   09/08/20 0055 09/08/20 0405 09/08/20 0816 09/08/20 1145  BP: (!) 161/93 129/86 132/83 124/71  Pulse: (!) 101 (!) 101 (!) 114 (!) 102  Resp: 16 14 20 16   Temp:      TempSrc:      SpO2: 93% 92% 95% 90%   No intake or output data in the 24 hours ending 09/08/20 1526 There were no vitals filed for this visit.  Examination:  General exam: Appears calm and comfortable , not in any acute distress Respiratory system: Clear to auscultation. Respiratory effort normal. Cardiovascular system: S1 & S2 heard, RRR. No JVD, murmurs, rubs, gallops or clicks. No pedal edema. Gastrointestinal system: Abdomen is nondistended, soft and nontender. No organomegaly or masses felt. Normal bowel sounds heard. Central nervous system: Alert and oriented. No focal neurological deficits. Extremities: Symmetric 5 x 5 power.   Bilateral leg swelling. Skin: No rashes, lesions or ulcers Psychiatry: Judgement and insight appear normal. Mood & affect appropriate.     Data Reviewed: I have personally reviewed following labs  and imaging studies  CBC: Recent Labs  Lab 09/07/20 1001 09/07/20 1934 09/08/20 0536  WBC 3.3* 7.0 3.0*  NEUTROABS 1.5 1.8 1.0*  HGB 13.3 13.4 11.4*  HCT 37.5* 38.7* 32.9*  MCV 103.5* 102.1* 101.9*  PLT 131.0* 136* 790*   Basic Metabolic Panel: Recent Labs  Lab 09/07/20 1001 09/07/20 1934 09/07/20 1939 09/08/20 0536  NA 144  140  --  142  K 3.0* 3.0*  --  3.0*  CL 101 97*  --  100  CO2 32 31  --  32  GLUCOSE 150* 142*  --  136*  BUN 4* <5*  --  <5*  CREATININE 0.58 0.60*  --  0.44*  CALCIUM 8.1* 8.3*  --  7.4*  MG  --   --  1.8 1.7  PHOS  --   --   --  3.4   GFR: CrCl cannot be calculated (Unknown ideal weight.). Liver Function Tests: Recent Labs  Lab 09/07/20 1001 09/07/20 1934 09/08/20 0536  AST 154* 180* 159*  ALT 55* 61* 51*  ALKPHOS 174* 172* 139*  BILITOT 4.9* 4.6* 4.1*  PROT 7.1 7.5 6.0*  ALBUMIN 3.2* 3.0* 2.5*   No results for input(s): LIPASE, AMYLASE in the last 168 hours. No results for input(s): AMMONIA in the last 168 hours. Coagulation Profile: Recent Labs  Lab 09/07/20 1001 09/08/20 0536  INR 1.4* 1.5*   Cardiac Enzymes: No results for input(s): CKTOTAL, CKMB, CKMBINDEX, TROPONINI in the last 168 hours. BNP (last 3 results) No results for input(s): PROBNP in the last 8760 hours. HbA1C: No results for input(s): HGBA1C in the last 72 hours. CBG: No results for input(s): GLUCAP in the last 168 hours. Lipid Profile: No results for input(s): CHOL, HDL, LDLCALC, TRIG, CHOLHDL, LDLDIRECT in the last 72 hours. Thyroid Function Tests: Recent Labs    09/08/20 0536  TSH 2.201   Anemia Panel: No results for input(s): VITAMINB12, FOLATE, FERRITIN, TIBC, IRON, RETICCTPCT in the last 72 hours. Sepsis Labs: Recent Labs  Lab 09/07/20 1934 09/08/20 0536  LATICACIDVEN 2.6* 2.8*    Recent Results (from the past 240 hour(s))  Blood Cultures (routine x 2)     Status: None (Preliminary result)   Collection Time: 09/07/20  7:34 PM   Specimen: BLOOD  Result Value Ref Range Status   Specimen Description   Final    BLOOD SITE NOT SPECIFIED Performed at [facility] [address] [place], [place] [address]    Special Requests   Final    BOTTLES DRAWN AEROBIC ONLY Blood Culture adequate volume Performed at [facility] [address]  [employer] [place], [place] [address]    Culture PENDING  Incomplete   Report Status PENDING  Incomplete  Resp Panel by RT-PCR (Flu A&B, Covid) Nasopharyngeal Swab     Status: None   Collection Time: 09/07/20 10:51 PM   Specimen: Nasopharyngeal Swab; Nasopharyngeal(NP) swabs in vial transport medium  Result Value Ref Range Status   SARS Coronavirus 2 by RT PCR NEGATIVE NEGATIVE Final    Comment: (NOTE) SARS-CoV-2 target nucleic acids are NOT DETECTED.  The SARS-CoV-2 RNA is generally detectable in upper respiratory specimens during the acute phase of infection. The lowest concentration of SARS-CoV-2 viral copies this assay can detect is 138 copies/mL. A negative result does not preclude SARS-Cov-2 infection and should not be used as the sole basis for treatment or other patient management decisions. A negative result may occur with  improper specimen collection/handling, submission  of specimen other than nasopharyngeal swab, presence of viral mutation(s) within the areas targeted by this assay, and inadequate number of viral copies(<138 copies/mL). A negative result must be combined with clinical observations, patient history, and epidemiological information. The expected result is Negative.  Fact Sheet for Patients:  EntrepreneurPulse.com.au  Fact Sheet for Healthcare Providers:  IncredibleEmployment.be  This test is no t yet approved or cleared by the [place] FDA and  has been authorized for detection and/or diagnosis of SARS-CoV-2 by FDA under an Emergency Use Authorization (EUA). This EUA will remain  in effect (meaning this test can be used) for the duration of the COVID-19 declaration under Section 564(b)(1) of the Act, 21 U.S.C.section 360bbb-3(b)(1), unless the authorization is terminated  or revoked sooner.       Influenza A by PCR NEGATIVE NEGATIVE Final   Influenza B by PCR NEGATIVE NEGATIVE Final    Comment: (NOTE) The Xpert Xpress  SARS-CoV-2/FLU/RSV plus assay is intended as an aid in the diagnosis of influenza from Nasopharyngeal swab specimens and should not be used as a sole basis for treatment. Nasal washings and aspirates are unacceptable for Xpert Xpress SARS-CoV-2/FLU/RSV testing.  Fact Sheet for Patients: EntrepreneurPulse.com.au  Fact Sheet for Healthcare Providers: IncredibleEmployment.be  This test is not yet approved or cleared by the [place] FDA and has been authorized for detection and/or diagnosis of SARS-CoV-2 by FDA under an Emergency Use Authorization (EUA). This EUA will remain in effect (meaning this test can be used) for the duration of the COVID-19 declaration under Section 564(b)(1) of the Act, 21 U.S.C. section 360bbb-3(b)(1), unless the authorization is terminated or revoked.  Performed at [facility], [place] [address] [place], [place] [address]      Radiology Studies: DG Chest 2 View  Result Date: 09/07/2020 CLINICAL DATA:  Confusion EXAM: CHEST - 2 VIEW COMPARISON:  07/30/2019 FINDINGS: Cardiac shadow is mildly prominent but accentuated by the portable technique. The overall inspiratory effort is poor. Minimal blunting of the left costophrenic angle is noted consistent with small effusion. No focal infiltrate is noted. No bony abnormality is seen. IMPRESSION: Minimal left pleural effusion.  No other focal abnormality is noted. Electronically Signed   By: Inez Catalina M.D.   On: 09/07/2020 20:30   CT HEAD WO CONTRAST  Result Date: 09/07/2020 CLINICAL DATA:  Altered mental status EXAM: CT HEAD WITHOUT CONTRAST TECHNIQUE: Contiguous axial images were obtained from the base of the skull through the vertex without intravenous contrast. COMPARISON:  None. FINDINGS: Brain: Image quality is degraded by motion. Mild cerebral atrophy. No acute intracranial abnormality. Specifically, no hemorrhage, hydrocephalus, mass lesion, acute  infarction, or significant intracranial injury. Vascular: No hyperdense vessel or unexpected calcification. Skull: No acute calvarial abnormality. Sinuses/Orbits: No acute findings Other: None IMPRESSION: Mild image quality degradation due to motion. No visible acute intracranial abnormality. Electronically Signed   By: Rolm Baptise M.D.   On: 09/07/2020 20:38     Scheduled Meds:  folic acid  1 mg Oral Daily   [START ON 09/09/2020] furosemide  40 mg Oral Daily   lactulose  20 g Oral TID   multivitamin with minerals  1 tablet Oral Daily   [START ON 09/09/2020] spironolactone  100 mg Oral Daily   spironolactone  50 mg Oral Once   thiamine  100 mg Oral Daily   Continuous Infusions:   LOS: 1 day    Time spent: 35 mins    Shawna Clamp, MD [facility]  If 7PM-7AM, please contact night-coverage

## 2020-09-08 NOTE — ED Notes (Signed)
Pt. Aware of given a urine sample. Nurse aware.

## 2020-09-08 NOTE — Consult Note (Signed)
Referring Provider: [facility], Babs Bertin, DO         Primary Care Physician:  [name], MD Primary Gastroenterologist: Ambrose Cellar, MD            Reason for Consultation: Liver cirrhosis, volume overload, altered mental status                 ASSESSMENT /  PLAN   45 y.o. 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  Decompensated ETOH-induced cirrhosis, hx TIPS for refractory ascites/ongoing ETOH abuse/AMS/volume overload  -- Relapsed alcohol abuse with acute intoxication; volume overload with increasing abdominal ascites and lower extremity edema; query TIPS shunt dysfunction versus recurrent portal hypertension in the setting of relapse and alcohol abuse.  He does have likely acute alcoholic hepatitis and there is evidence for mild encephalopathy on presentation.  Mental status is improved though asterixis remains.  Current plan: --Follow-up result of liver Doppler/TIPS exam --Continue diuretics --Low-sodium diet --Lactulose twice daily until asterixis resolves --We discussed at length the importance of remaining alcohol free, relapse is common but hopefully he can regain sobriety. --He is due [place] screening; follow-up ultrasound report --CIWA protocol given acute alcohol intoxication; alcohol blood level initially very high at 491 --Follow liver enzymes and INR --Correct electrolytes    HPI:     Shawn Martin is a 45 y.o. 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  He called our office on 09/07/19 to report increasing vol overload in his abdomen and lower extremities, right greater than left leg.  It was noted that he was overdue for lab work and Dr. Havery Moros ordered basic labs and recommended furosemide 20 mg and spironolactone 50 mg daily.  Also patient had follow-up with Dr. Kathlene Cote given prior TIPS placement.  Subsequently he was brought to the emergency department where he was noted to be intoxicated with ethanol level 490, elevated liver enzymes, elevated INR and confusion/combativeness.  On presentation labs notable for: Albumin 3.0, alk phos 142, total bilirubin 4.6, AST 180, ALT 61. Ethanol 491 Lactate 2.6 Hemoglobin 13.4, platelet count 136 COVID-19 negative  He reports that just over a week ago he relapsed and started drinking very heavily.  He reports that he was struggling with stressors with work and home and relapsed.  He was drinking heavy vodka at some points is much as half a gallon per day.  Once this started he noticed the increasing abdominal swelling and lower extremity edema, right greater than left lower leg.  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  He also notes that he had a right middle abdominal pain which was acute and sharp about a week ago lasting about 2 days.  Not associated with eating or bowel movement at that time.  Seems like to him the abdominal swelling and lower extremity swelling coincided with this pain.  He also reports wanting to regain sobriety and get back to work as soon as he can.   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    Past Surgical History:  Procedure Laterality Date   IR PARACENTESIS  02/07/2019   IR  PARACENTESIS  05/25/2019   IR PARACENTESIS  06/10/2019   IR PARACENTESIS  06/15/2019   IR PARACENTESIS  06/22/2019   IR PARACENTESIS  07/01/2019   IR PARACENTESIS  07/12/2019   IR PARACENTESIS  07/19/2019   IR PARACENTESIS  07/26/2019   IR RADIOLOGIST EVAL & MGMT  07/07/2019   IR RADIOLOGIST EVAL & MGMT  08/23/2019   IR RADIOLOGIST EVAL & MGMT  02/14/2020   IR THORACENTESIS ASP PLEURAL SPACE W/IMG GUIDE  07/26/2019   IR TIPS  07/26/2019   IR TRANSCATHETER BX  06/22/2019   IR US GUIDE VASC ACCESS RIGHT  06/22/2019   IR VENOGRAM HEPATIC WO HEMODYNAMIC EVALUATION  06/22/2019   NOSE SURGERY     RADIOLOGY WITH ANESTHESIA N/A 07/26/2019   Procedure: TIPS;  Surgeon: Aletta Edouard, MD;  Location: [facility];  Service: Radiology;  Laterality: N/A;    Prior to Admission medications   Medication Sig Start Date End Date Taking? Authorizing Provider  cetirizine (ZYRTEC) 10 MG tablet Take 10 mg by mouth daily.    [provider]  ferrous sulfate 325 (65 FE) MG tablet Take 325 mg by mouth daily.    [provider]  furosemide (LASIX) 20 MG tablet Take 1 tablet (20 mg total) by mouth daily. 09/06/20   Armbruster, [name], MD  lactulose, encephalopathy, (CHRONULAC) 10 GM/15ML SOLN TAKE 15ML BY MOUTH TWICE DAILY PLEASE CALL YOUR GI DOCTOR IF YOU START TO FEEL CONFUSED. 09/07/20 09/07/21  Esterwood, Amy S, PA-C  Multiple Vitamin (MULTIVITAMIN WITH MINERALS) TABS tablet Take 1 tablet by mouth daily.    [provider]  oxyCODONE (ROXICODONE) 5 MG immediate release tablet Take 1 tablet (5 mg total) by mouth every 4 (four) hours as needed for moderate pain or severe pain (no more than 6 tabs daily). 08/22/20 08/22/21  Duffy Bruce, MD  potassium chloride SA (KLOR-CON) 20 MEQ tablet Take 1 tablet (20 mEq total) by mouth daily. 09/07/20   Armbruster, [name], MD  spironolactone (ALDACTONE) 50 MG tablet Take 1 tablet (50 mg total) by mouth daily. 09/06/20   Armbruster, [name], MD  Zinc 10 MG LOZG Use as  directed in the mouth or throat.    [provider]    Current Facility-Administered Medications  Medication Dose Route Frequency Provider Last Rate Last Admin   acetaminophen (TYLENOL) tablet 650 mg  650 mg Oral Q6H PRN Howerter, Justin B, DO   650 mg at 09/07/20 2251   Or   acetaminophen (TYLENOL) suppository 650 mg  650 mg Rectal Q6H PRN Howerter, Justin B, DO       folic acid (FOLVITE) tablet 1 mg  1 mg Oral Daily Howerter, Justin B, DO       furosemide (LASIX) tablet 20 mg  20 mg Oral Daily Howerter, Justin B, DO       lactulose (CHRONULAC) 10 GM/15ML solution 20 g  20 g Oral TID Howerter, Justin B, DO       LORazepam (ATIVAN) injection 1 mg  1 mg Intravenous Q6H PRN Howerter, Justin B, DO       multivitamin with minerals tablet 1 tablet  1 tablet Oral  Daily Howerter, Justin B, DO       spironolactone (ALDACTONE) tablet 50 mg  50 mg Oral Daily Howerter, Justin B, DO       thiamine tablet 100 mg  100 mg Oral Daily Howerter, Justin B, DO       Current Outpatient Medications  Medication Sig Dispense Refill   cetirizine (ZYRTEC) 10 MG tablet Take 10 mg by mouth daily.     ferrous sulfate 325 (65 FE) MG tablet Take 325 mg by mouth daily.     furosemide (LASIX) 20 MG tablet Take 1 tablet (20 mg total) by mouth daily. 30 tablet 1   lactulose, encephalopathy, (CHRONULAC) 10 GM/15ML SOLN TAKE 15ML BY MOUTH TWICE DAILY PLEASE CALL YOUR GI DOCTOR IF YOU START TO FEEL CONFUSED. 900 mL 1   Multiple Vitamin (MULTIVITAMIN WITH MINERALS) TABS tablet Take 1 tablet by mouth daily.     oxyCODONE (ROXICODONE) 5 MG immediate release tablet Take 1 tablet (5 mg total) by mouth every 4 (four) hours as needed for moderate pain or severe pain (no more than 6 tabs daily). 12 tablet 0   potassium chloride SA (KLOR-CON) 20 MEQ tablet Take 1 tablet (20 mEq total) by mouth daily. 30 tablet 0   spironolactone (ALDACTONE) 50 MG tablet Take 1 tablet (50 mg total) by mouth daily. 30 tablet 1   Zinc 10 MG LOZG  Use as directed in the mouth or throat.      Allergies as of 09/07/2020 - Review Complete 09/07/2020  Allergen Reaction Noted   Gluten meal Diarrhea 03/21/2019    Family History  Problem Relation Age of Onset   Healthy Mother    Hypertension Father    Heart attack Father    Leukemia Brother    Colon cancer Neg Hx    Stomach cancer Neg Hx    Pancreatic cancer Neg Hx     Social History   Tobacco Use   Smoking status: Former    Pack years: 0.00   Smokeless tobacco: Former    Types: Chew    Quit date: 03/2017  Vaping Use   Vaping Use: Never used  Substance Use Topics   Alcohol use: Yes    Comment: every day drinker, 6 pack per day   Drug use: Not Currently    Review of Systems: All systems reviewed and negative except where noted in HPI.  Physical Exam: Vital signs in last 24 hours: Temp:  [98.2 F (36.8 C)] 98.2 F (36.8 C) (06/10 1918) Pulse Rate:  [85-114] 114 (06/11 0816) Resp:  [14-20] 20 (06/11 0816) BP: (124-161)/(69-119) 132/83 (06/11 0816) SpO2:  [92 %-97 %] 95 % (06/11 0816)   General:   Awake, alert, NAD Psych:  Pleasant, cooperative. Normal mood and affect. Eyes:  Pupils equal, sclera clear, no icterus.    Neck:  Supple; no masses Lungs:  Clear throughout to auscultation.   No wheezes, crackles, or rhonchi.  Heart:  Regular rate and rhythm; no murmurs, no lower extremity edema Abdomen:  Soft, non-distended, nontender, BS active, no palp mass   Rectal:  Deferred  Msk:  Symmetrical without gross deformities. . Neurologic:  Alert and  oriented x4;  grossly normal neurologically. Skin:  Intact without significant lesions or rashes.   Intake/Output from previous day: No intake/output data recorded. Intake/Output this shift: No intake/output data recorded.  Lab Results: Recent Labs    09/07/20 1001 09/07/20 1934 09/08/20 0536  WBC 3.3* 7.0 3.0*  HGB 13.3 13.4 11.4*  HCT 37.5* 38.7* 32.9*  PLT 131.0* 136* 117*   BMET Recent Labs     09/07/20 1001 09/07/20 1934 09/08/20 0536  NA 144 140 142  K 3.0* 3.0* 3.0*  CL 101 97* 100  CO2 32 31 32  GLUCOSE 150* 142* 136*  BUN 4* <5* <5*  CREATININE 0.58 0.60* 0.44*  CALCIUM 8.1* 8.3* 7.4*   LFT Recent Labs    09/08/20 0536  PROT 6.0*  ALBUMIN 2.5*  AST 159*  ALT 51*  ALKPHOS 139*  BILITOT 4.1*  BILIDIR 1.9*   PT/INR Recent Labs    09/07/20 1001 09/08/20 0536  LABPROT 15.4* 17.8*  INR 1.4* 1.5*   Hepatitis Panel No results for input(s): HEPBSAG, HCVAB, HEPAIGM, HEPBIGM in the last 72 hours.   . CBC Latest Ref Rng & Units 09/08/2020 09/07/2020 09/07/2020  WBC 4.0 - 10.5 K/uL 3.0(L) 7.0 3.3(L)  Hemoglobin 13.0 - 17.0 g/dL 11.4(L) 13.4 13.3  Hematocrit 39.0 - 52.0 % 32.9(L) 38.7(L) 37.5(L)  Platelets 150 - 400 K/uL 117(L) 136(L) 131.0(L)    . CMP Latest Ref Rng & Units 09/08/2020 09/07/2020 09/07/2020  Glucose 70 - 99 mg/dL 136(H) 142(H) 150(H)  BUN 6 - 20 mg/dL <5(L) <5(L) 4(L)  Creatinine 0.61 - 1.24 mg/dL 0.44(L) 0.60(L) 0.58  Sodium 135 - 145 mmol/L 142 140 144  Potassium 3.5 - 5.1 mmol/L 3.0(L) 3.0(L) 3.0(L)  Chloride 98 - 111 mmol/L 100 97(L) 101  CO2 22 - 32 mmol/L 32 31 32  Calcium 8.9 - 10.3 mg/dL 7.4(L) 8.3(L) 8.1(L)  Total Protein 6.5 - 8.1 g/dL 6.0(L) 7.5 7.1  Total Bilirubin 0.3 - 1.2 mg/dL 4.1(H) 4.6(H) 4.9(H)  Alkaline Phos 38 - 126 U/L 139(H) 172(H) 174(H)  AST 15 - 41 U/L 159(H) 180(H) 154(H)  ALT 0 - 44 U/L 51(H) 61(H) 55(H)   Studies/Results: DG Chest 2 View  Result Date: 09/07/2020 CLINICAL DATA:  Confusion EXAM: CHEST - 2 VIEW COMPARISON:  07/30/2019 FINDINGS: Cardiac shadow is mildly prominent but accentuated by the portable technique. The overall inspiratory effort is poor. Minimal blunting of the left costophrenic angle is noted consistent with small effusion. No focal infiltrate is noted. No bony abnormality is seen. IMPRESSION: Minimal left pleural effusion.  No other focal abnormality is noted. Electronically Signed   By:  Inez Catalina M.D.   On: 09/07/2020 20:30   CT HEAD WO CONTRAST  Result Date: 09/07/2020 CLINICAL DATA:  Altered mental status EXAM: CT HEAD WITHOUT CONTRAST TECHNIQUE: Contiguous axial images were obtained from the base of the skull through the vertex without intravenous contrast. COMPARISON:  None. FINDINGS: Brain: Image quality is degraded by motion. Mild cerebral atrophy. No acute intracranial abnormality. Specifically, no hemorrhage, hydrocephalus, mass lesion, acute infarction, or significant intracranial injury. Vascular: No hyperdense vessel or unexpected calcification. Skull: No acute calvarial abnormality. Sinuses/Orbits: No acute findings Other: None IMPRESSION: Mild image quality degradation due to motion. No visible acute intracranial abnormality. Electronically Signed   By: Rolm Baptise M.D.   On: 09/07/2020 20:38    Principal Problem:   Acute encephalopathy Active Problems:   Cirrhosis ([place])   Acute alcohol intoxication ([place])   Alcohol abuse with intoxication ([place])   Lactic acidosis   Hypokalemia   Allergic rhinitis    Berl Bonfanti M. Khasir Woodrome, M.D. @  09/08/2020, 9:02 AM

## 2020-09-08 NOTE — Progress Notes (Signed)
Pt arrived to room 1504 from ED.

## 2020-09-09 ENCOUNTER — Inpatient Hospital Stay (HOSPITAL_COMMUNITY): Payer: BC Managed Care – PPO

## 2020-09-09 DIAGNOSIS — K7460 Unspecified cirrhosis of liver: Secondary | ICD-10-CM

## 2020-09-09 DIAGNOSIS — K7290 Hepatic failure, unspecified without coma: Secondary | ICD-10-CM

## 2020-09-09 DIAGNOSIS — M7989 Other specified soft tissue disorders: Secondary | ICD-10-CM

## 2020-09-09 DIAGNOSIS — F10129 Alcohol abuse with intoxication, unspecified: Secondary | ICD-10-CM

## 2020-09-09 DIAGNOSIS — K766 Portal hypertension: Secondary | ICD-10-CM

## 2020-09-09 LAB — COMPREHENSIVE METABOLIC PANEL
ALT: 51 U/L — ABNORMAL HIGH (ref 0–44)
AST: 151 U/L — ABNORMAL HIGH (ref 15–41)
Albumin: 2.6 g/dL — ABNORMAL LOW (ref 3.5–5.0)
Alkaline Phosphatase: 146 U/L — ABNORMAL HIGH (ref 38–126)
Anion gap: 10 (ref 5–15)
BUN: <5 mg/dL — ABNORMAL LOW (ref 6–20)
CO2: 30 mmol/L (ref 22–32)
Calcium: 7.7 mg/dL — ABNORMAL LOW (ref 8.9–10.3)
Chloride: 93 mmol/L — ABNORMAL LOW (ref 98–111)
Creatinine, Ser: 0.51 mg/dL — ABNORMAL LOW (ref 0.61–1.24)
GFR, Estimated: >60 mL/min (ref >60)
Glucose, Bld: 123 mg/dL — ABNORMAL HIGH (ref 70–99)
Potassium: 2.9 mmol/L — ABNORMAL LOW (ref 3.5–5.1)
Sodium: 133 mmol/L — ABNORMAL LOW (ref 135–145)
Total Bilirubin: 5.9 mg/dL — ABNORMAL HIGH (ref 0.3–1.2)
Total Protein: 6.6 g/dL (ref 6.5–8.1)

## 2020-09-09 LAB — CBC
HCT: 34.5 % — ABNORMAL LOW (ref 39.0–52.0)
Hemoglobin: 12 g/dL — ABNORMAL LOW (ref 13.0–17.0)
MCH: 35.2 pg — ABNORMAL HIGH (ref 26.0–34.0)
MCHC: 34.8 g/dL (ref 30.0–36.0)
MCV: 101.2 fL — ABNORMAL HIGH (ref 80.0–100.0)
Platelets: 106 10*3/uL — ABNORMAL LOW (ref 150–400)
RBC: 3.41 MIL/uL — ABNORMAL LOW (ref 4.22–5.81)
RDW: 16.5 % — ABNORMAL HIGH (ref 11.5–15.5)
WBC: 4.1 10*3/uL (ref 4.0–10.5)
nRBC: 0 % (ref 0.0–0.2)

## 2020-09-09 LAB — MAGNESIUM: Magnesium: 1.5 mg/dL — ABNORMAL LOW (ref 1.7–2.4)

## 2020-09-09 LAB — PHOSPHORUS: Phosphorus: 2.4 mg/dL — ABNORMAL LOW (ref 2.5–4.6)

## 2020-09-09 LAB — PROTIME-INR
INR: 1.5 — ABNORMAL HIGH (ref 0.8–1.2)
Prothrombin Time: 17.8 seconds — ABNORMAL HIGH (ref 11.4–15.2)

## 2020-09-09 MED ORDER — POTASSIUM CHLORIDE 20 MEQ PO PACK
40.0000 meq | PACK | Freq: Once | ORAL | Status: DC
  Filled 2020-09-09: qty 2

## 2020-09-09 MED ORDER — LACTULOSE 10 GM/15ML PO SOLN
10.0000 g | Freq: Two times a day (BID) | ORAL | Status: DC
  Administered 2020-09-09 – 2020-09-11 (×5): 10 g via ORAL
  Filled 2020-09-09 (×5): qty 15

## 2020-09-09 MED ORDER — POTASSIUM CHLORIDE 20 MEQ PO PACK
40.0000 meq | PACK | Freq: Once | ORAL | Status: AC
  Administered 2020-09-09: 40 meq via ORAL
  Filled 2020-09-09: qty 2

## 2020-09-09 MED ORDER — MAGNESIUM SULFATE 2 GM/50ML IV SOLN
2.0000 g | Freq: Once | INTRAVENOUS | Status: AC
  Administered 2020-09-09: 2 g via INTRAVENOUS
  Filled 2020-09-09: qty 50

## 2020-09-09 NOTE — Progress Notes (Signed)
PROGRESS NOTE    Shawn Martin  JJO:[id number] DOB: 07-19-1975 DOA: 09/07/2020 PCP: [name], MD   Brief Narrative:  This 45 y.o. male with PMH significant for alcoholic cirrhosis status post TIPS procedure in April 2021, allergic rhinitis, chronic hypokalemia, who is admitted with acute encephalopathy after presenting from home for the evaluation of altered mental status.  Wife reports that the patient completely stopped all alcohol consumption immediately following TIPS procedure and as far as she is aware, the patient has remained abstinent to alcohol in the interval. Patient has been noncompliant with lactulose and diuretic regimen. GI was consulted, Patient was given lactulose.  Acute encephalopathy has improved.    Assessment & Plan:   Principal Problem:   Acute encephalopathy Active Problems:   Cirrhosis ([place])   Acute alcohol intoxication ([place])   Alcohol abuse with intoxication ([place])   Lactic acidosis   Hypokalemia   Allergic rhinitis  Acute encephalopathy: Suspect this is multifactorial in nature. It appears like patient has resumed drinking alcohol. Serum ETOH level found to be 500.   Hepatic encaph due to noncompliance with lactulose. GI consulted recommended follow-up liver Doppler. Continue diuretics , continue lactulose, low-sodium diet. Patient is alert and oriented back to his baseline mental status. Reduce lactulose 10 mg every 12 since he has developed diarrhea.  Alcoholic cirrhosis: Patient underwent TIPS procedure in April 2021. Continued to drink alcohol.  Noncompliant with his medication regimen. Resume Lasix and spironolactone regimen.  Alcohol intoxication: Continue thiamine and folic acid. Continue CIWA protocol.  Lactic acidosis: No source of infection found.  This could be multifactorial. This could be due to alcohol intoxication.   DVT prophylaxis:  Code Status:  Full code. Family Communication:  No family at bed  side. Disposition Plan:   Status is: Inpatient  Remains inpatient appropriate because:Inpatient level of care appropriate due to severity of illness  Dispo: The patient is from: Home              Anticipated d/c is to: Home 6/13              Patient currently is not medically stable to d/c.   Difficult to place patient No  Consultants:  GI  Procedures:  Antimicrobials:  Anti-infectives (From admission, onward)    None        Subjective: Patient was seen and examined at bedside.  Overnight events noted.  Patient reports feeling better .   He is alert and oriented , back to his baseline mental status.  Patient report diarrhea 6-7 times.   He also reported increased frequency because of diuretics.  Objective: Vitals:   09/08/20 2323 09/09/20 0346 09/09/20 0354 09/09/20 1412  BP: 136/87 (!) 141/89  137/88  Pulse: (!) 109 (!) 102  92  Resp: 16 16  16   Temp: 98.6 F (37 C) 98.3 F (36.8 C)  99.5 F (37.5 C)  TempSrc: Oral Oral  Oral  SpO2: 99% 97%  96%  Weight:   93.8 kg    No intake or output data in the 24 hours ending 09/09/20 1433 Filed Weights   09/09/20 0354  Weight: 93.8 kg    Examination:  General exam: Appears calm and comfortable , not in any acute distress. Respiratory system: Clear to auscultation. Respiratory effort normal. Cardiovascular system: S1 & S2 heard, RRR. No JVD, murmurs, rubs, gallops or clicks. No pedal edema. Gastrointestinal system: Abdomen is nondistended, soft and nontender. No organomegaly or masses felt. Normal bowel sounds heard.  Central nervous system: Alert and oriented. No focal neurological deficits. Extremities: Symmetric 5 x 5 power.   No edema, no cyanosis, no clubbing. Skin: No rashes, lesions or ulcers Psychiatry: Judgement and insight appear normal. Mood & affect appropriate.     Data Reviewed: I have personally reviewed following labs and imaging studies  CBC: Recent Labs  Lab 09/07/20 1001 09/07/20 1934  09/08/20 0536 09/09/20 0521  WBC 3.3* 7.0 3.0* 4.1  NEUTROABS 1.5 1.8 1.0*  --   HGB 13.3 13.4 11.4* 12.0*  HCT 37.5* 38.7* 32.9* 34.5*  MCV 103.5* 102.1* 101.9* 101.2*  PLT 131.0* 136* 117* 660*   Basic Metabolic Panel: Recent Labs  Lab 09/07/20 1001 09/07/20 1934 09/07/20 1939 09/08/20 0536 09/09/20 0521  NA 144 140  --  142 133*  K 3.0* 3.0*  --  3.0* 2.9*  CL 101 97*  --  100 93*  CO2 32 31  --  32 30  GLUCOSE 150* 142*  --  136* 123*  BUN 4* <5*  --  <5* <5*  CREATININE 0.58 0.60*  --  0.44* 0.51*  CALCIUM 8.1* 8.3*  --  7.4* 7.7*  MG  --   --  1.8 1.7 1.5*  PHOS  --   --   --  3.4 2.4*   GFR: Estimated Creatinine Clearance: 135.8 mL/min (A) (by C-G formula based on SCr of 0.51 mg/dL (L)). Liver Function Tests: Recent Labs  Lab 09/07/20 1001 09/07/20 1934 09/08/20 0536 09/09/20 0521  AST 154* 180* 159* 151*  ALT 55* 61* 51* 51*  ALKPHOS 174* 172* 139* 146*  BILITOT 4.9* 4.6* 4.1* 5.9*  PROT 7.1 7.5 6.0* 6.6  ALBUMIN 3.2* 3.0* 2.5* 2.6*   No results for input(s): LIPASE, AMYLASE in the last 168 hours. No results for input(s): AMMONIA in the last 168 hours. Coagulation Profile: Recent Labs  Lab 09/07/20 1001 09/08/20 0536 09/09/20 0521  INR 1.4* 1.5* 1.5*   Cardiac Enzymes: No results for input(s): CKTOTAL, CKMB, CKMBINDEX, TROPONINI in the last 168 hours. BNP (last 3 results) No results for input(s): PROBNP in the last 8760 hours. HbA1C: No results for input(s): HGBA1C in the last 72 hours. CBG: No results for input(s): GLUCAP in the last 168 hours. Lipid Profile: No results for input(s): CHOL, HDL, LDLCALC, TRIG, CHOLHDL, LDLDIRECT in the last 72 hours. Thyroid Function Tests: Recent Labs    09/08/20 0536  TSH 2.201   Anemia Panel: No results for input(s): VITAMINB12, FOLATE, FERRITIN, TIBC, IRON, RETICCTPCT in the last 72 hours. Sepsis Labs: Recent Labs  Lab 09/07/20 1934 09/08/20 0536  LATICACIDVEN 2.6* 2.8*    Recent Results  (from the past 240 hour(s))  Blood Cultures (routine x 2)     Status: None (Preliminary result)   Collection Time: 09/07/20  7:34 PM   Specimen: BLOOD  Result Value Ref Range Status   Specimen Description   Final    BLOOD SITE NOT SPECIFIED Performed at [facility] [address] [place], [place] [address]    Special Requests   Final    BOTTLES DRAWN AEROBIC ONLY Blood Culture adequate volume Performed at [facility] [address] [place], [place] [address]    Culture   Final    NO GROWTH 1 DAY Performed at [facility], [place] [address] [place], [place] [address]    Report Status PENDING  Incomplete  Resp Panel by RT-PCR (Flu A&B, Covid) Nasopharyngeal Swab  Status: None   Collection Time: 09/07/20 10:51 PM   Specimen: Nasopharyngeal Swab; Nasopharyngeal(NP) swabs in vial transport medium  Result Value Ref Range Status   SARS Coronavirus 2 by RT PCR NEGATIVE NEGATIVE Final    Comment: (NOTE) SARS-CoV-2 target nucleic acids are NOT DETECTED.  The SARS-CoV-2 RNA is generally detectable in upper respiratory specimens during the acute phase of infection. The lowest concentration of SARS-CoV-2 viral copies this assay can detect is 138 copies/mL. A negative result does not preclude SARS-Cov-2 infection and should not be used as the sole basis for treatment or other patient management decisions. A negative result may occur with  improper specimen collection/handling, submission of specimen other than nasopharyngeal swab, presence of viral mutation(s) within the areas targeted by this assay, and inadequate number of viral copies(<138 copies/mL). A negative result must be combined with clinical observations, patient history, and epidemiological information. The expected result is Negative.  Fact Sheet for Patients:  EntrepreneurPulse.com.au  Fact Sheet for Healthcare Providers:   IncredibleEmployment.be  This test is no t yet approved or cleared by the [place] FDA and  has been authorized for detection and/or diagnosis of SARS-CoV-2 by FDA under an Emergency Use Authorization (EUA). This EUA will remain  in effect (meaning this test can be used) for the duration of the COVID-19 declaration under Section 564(b)(1) of the Act, 21 U.S.C.section 360bbb-3(b)(1), unless the authorization is terminated  or revoked sooner.       Influenza A by PCR NEGATIVE NEGATIVE Final   Influenza B by PCR NEGATIVE NEGATIVE Final    Comment: (NOTE) The Xpert Xpress SARS-CoV-2/FLU/RSV plus assay is intended as an aid in the diagnosis of influenza from Nasopharyngeal swab specimens and should not be used as a sole basis for treatment. Nasal washings and aspirates are unacceptable for Xpert Xpress SARS-CoV-2/FLU/RSV testing.  Fact Sheet for Patients: EntrepreneurPulse.com.au  Fact Sheet for Healthcare Providers: IncredibleEmployment.be  This test is not yet approved or cleared by the [place] FDA and has been authorized for detection and/or diagnosis of SARS-CoV-2 by FDA under an Emergency Use Authorization (EUA). This EUA will remain in effect (meaning this test can be used) for the duration of the COVID-19 declaration under Section 564(b)(1) of the Act, 21 U.S.C. section 360bbb-3(b)(1), unless the authorization is terminated or revoked.  Performed at [facility], [place] [address] [place],  [address]      Radiology Studies: DG Chest 2 View  Result Date: 09/07/2020 CLINICAL DATA:  Confusion EXAM: CHEST - 2 VIEW COMPARISON:  07/30/2019 FINDINGS: Cardiac shadow is mildly prominent but accentuated by the portable technique. The overall inspiratory effort is poor. Minimal blunting of the left costophrenic angle is noted consistent with small effusion. No focal infiltrate is noted. No bony  abnormality is seen. IMPRESSION: Minimal left pleural effusion.  No other focal abnormality is noted. Electronically Signed   By: Inez Catalina M.D.   On: 09/07/2020 20:30   CT HEAD WO CONTRAST  Result Date: 09/07/2020 CLINICAL DATA:  Altered mental status EXAM: CT HEAD WITHOUT CONTRAST TECHNIQUE: Contiguous axial images were obtained from the base of the skull through the vertex without intravenous contrast. COMPARISON:  None. FINDINGS: Brain: Image quality is degraded by motion. Mild cerebral atrophy. No acute intracranial abnormality. Specifically, no hemorrhage, hydrocephalus, mass lesion, acute infarction, or significant intracranial injury. Vascular: No hyperdense vessel or unexpected calcification. Skull: No acute calvarial abnormality. Sinuses/Orbits: No acute findings Other: None IMPRESSION: Mild image quality degradation due to motion.  No visible acute intracranial abnormality. Electronically Signed   By: Rolm Baptise M.D.   On: 09/07/2020 20:38   US LIVER DOPPLER  Result Date: 09/08/2020 CLINICAL DATA:  45 year old male with a history of prior tips 07/26/2019 and abdominal swelling EXAM: DUPLEX ULTRASOUND OF LIVER TECHNIQUE: Color and duplex Doppler ultrasound was performed to evaluate the hepatic in-flow and out-flow vessels. COMPARISON:  02/14/2020 FINDINGS: Portal Vein Velocities Main:  43 cm/sec Right:  45 cm/sec Left:  32 cm/sec Tips Proximal: 85 centimeter/second Mid: 104 centimeter/second Distal: 186 centimeter/second Hepatic Vein Velocities Right:  117 cm/sec Middle:  17 cm/sec Left:  26 cm/sec Hepatic Artery Velocity:  118 cm/sec Splenic Vein Velocity:  28 cm/sec Varices: None visualized Ascites: Ascites present Cirrhotic changes of the liver IMPRESSION: Directed duplex demonstrates patent TIPS with low resistance waveform. Ascites present. Signed, Dulcy Fanny. Dellia Nims, RPVI [facility] Electronically Signed   By: Corrie Mckusick D.O.    On: 09/08/2020 16:14   VAS [place] LOWER EXTREMITY VENOUS (DVT)  Result Date: 09/09/2020  Lower Venous DVT Study Patient Name:  Shawn Martin  Date of Exam:   09/09/2020 Medical Rec #: [id number]             Accession #:    [id number] Date of Birth: May 21, 1975              Patient Gender: M Patient Age:   45Y Exam Location:  [facility] Procedure:      VAS [place] LOWER EXTREMITY VENOUS (DVT) Referring Phys: [id number] Jonel Sick --------------------------------------------------------------------------------  Indications: Swelling in patient with portal HTN, TIPS.  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 Comparison Study: 07-01-2019 Prior bilateral lower extremity venous study was                   negative for DVT. Performing Technologist: Darlin Coco RDMS,RVT  Examination Guidelines: A complete evaluation includes B-mode imaging, spectral Doppler, color Doppler, and power Doppler as needed of all accessible portions of each vessel. Bilateral testing is considered an integral part of a complete examination. Limited examinations for reoccurring indications may be performed as noted. The reflux portion of the exam is performed with the patient in reverse Trendelenburg.  +---------+---------------+---------+-----------+----------+--------------+ RIGHT    CompressibilityPhasicitySpontaneityPropertiesThrombus Aging +---------+---------------+---------+-----------+----------+--------------+ CFV      Full           Yes      Yes                                 +---------+---------------+---------+-----------+----------+--------------+ SFJ      Full                                                        +---------+---------------+---------+-----------+----------+--------------+ FV Prox  Full                                                        +---------+---------------+---------+-----------+----------+--------------+ FV Mid   Full                                                         +---------+---------------+---------+-----------+----------+--------------+  FV DistalFull                                                        +---------+---------------+---------+-----------+----------+--------------+ PFV      Full                                                        +---------+---------------+---------+-----------+----------+--------------+ POP      Full           Yes      Yes                                 +---------+---------------+---------+-----------+----------+--------------+ PTV      Full                                                        +---------+---------------+---------+-----------+----------+--------------+ PERO     Full                                                        +---------+---------------+---------+-----------+----------+--------------+ Gastroc  Full                                                        +---------+---------------+---------+-----------+----------+--------------+   +---------+---------------+---------+-----------+----------+--------------+ LEFT     CompressibilityPhasicitySpontaneityPropertiesThrombus Aging +---------+---------------+---------+-----------+----------+--------------+ CFV      Full           Yes      Yes                                 +---------+---------------+---------+-----------+----------+--------------+ SFJ      Full                                                        +---------+---------------+---------+-----------+----------+--------------+ FV Prox  Full                                                        +---------+---------------+---------+-----------+----------+--------------+ FV Mid   Full                                                        +---------+---------------+---------+-----------+----------+--------------+  FV DistalFull                                                         +---------+---------------+---------+-----------+----------+--------------+ PFV      Full                                                        +---------+---------------+---------+-----------+----------+--------------+ POP      Full           Yes      Yes                                 +---------+---------------+---------+-----------+----------+--------------+ PTV      Full                                                        +---------+---------------+---------+-----------+----------+--------------+ PERO     Full                                                        +---------+---------------+---------+-----------+----------+--------------+ Gastroc  Full                                                        +---------+---------------+---------+-----------+----------+--------------+    Summary: RIGHT: - There is no evidence of deep vein thrombosis in the lower extremity.  - No cystic structure found in the popliteal fossa.  LEFT: - There is no evidence of deep vein thrombosis in the lower extremity.  - No cystic structure found in the popliteal fossa.  *See table(s) above for measurements and observations.    Preliminary      Scheduled Meds:  folic acid  1 mg Oral Daily   furosemide  40 mg Oral Daily   lactulose  10 g Oral BID   multivitamin with minerals  1 tablet Oral Daily   potassium chloride  40 mEq Oral Once   potassium chloride  40 mEq Oral Once   spironolactone  100 mg Oral Daily   spironolactone  50 mg Oral Once   thiamine  100 mg Oral Daily   Continuous Infusions:   LOS: 2 days    Time spent: 25 mins    Shawna Clamp, MD [facility]   If 7PM-7AM, please contact night-coverage

## 2020-09-09 NOTE — Progress Notes (Signed)
Progress Note   Subjective  Patient without complaints today other than sleeping poorly and feeling a bit anxious Feels better than yesterday Wife at bedside Urinating frequently with diuretics Having diarrhea 6 or 7 times throughout the night due to lactulose, no melena or rectal bleeding Lower extremity edema seems better to him today No abdominal pain   Objective  Vital signs in last 24 hours: Temp:  [98.1 F (36.7 C)-98.6 F (37 C)] 98.3 F (36.8 C) (06/12 0346) Pulse Rate:  [101-117] 102 (06/12 0346) Resp:  [16-20] 16 (06/12 0346) BP: (124-145)/(68-95) 141/89 (06/12 0346) SpO2:  [90 %-99 %] 97 % (06/12 0346) Weight:  [93.8 kg] 93.8 kg (06/12 0354) Last BM Date: 09/09/20 Gen: awake, alert, NAD HEENT: Sclera mildly icteric, OP clear CV: Mild tachycardia but regular Pulm: CTA b/l Abd: soft, very slightly distended without tenderness and abdomen is not tense, +BS throughout Ext: no c/c, now trace edema left lower extremity 1+ edema right lower extremity Neuro: nonfocal, mild tremor but asterixis has resolve   Intake/Output from previous day: No intake/output data recorded. Intake/Output this shift: No intake/output data recorded.  Lab Results: Recent Labs    09/07/20 1934 09/08/20 0536 09/09/20 0521  WBC 7.0 3.0* 4.1  HGB 13.4 11.4* 12.0*  HCT 38.7* 32.9* 34.5*  PLT 136* 117* 106*   BMET Recent Labs    09/07/20 1934 09/08/20 0536 09/09/20 0521  NA 140 142 133*  K 3.0* 3.0* 2.9*  CL 97* 100 93*  CO2 31 32 30  GLUCOSE 142* 136* 123*  BUN <5* <5* <5*  CREATININE 0.60* 0.44* 0.51*  CALCIUM 8.3* 7.4* 7.7*   LFT Recent Labs    09/08/20 0536 09/09/20 0521  PROT 6.0* 6.6  ALBUMIN 2.5* 2.6*  AST 159* 151*  ALT 51* 51*  ALKPHOS 139* 146*  BILITOT 4.1* 5.9*  BILIDIR 1.9*  --    PT/INR Recent Labs    09/08/20 0536 09/09/20 0521  LABPROT 17.8* 17.8*  INR 1.5* 1.5*   Hepatitis Panel No results for input(s): HEPBSAG, HCVAB, HEPAIGM, HEPBIGM  in the last 72 hours.  Studies/Results: DG Chest 2 View  Result Date: 09/07/2020 CLINICAL DATA:  Confusion EXAM: CHEST - 2 VIEW COMPARISON:  07/30/2019 FINDINGS: Cardiac shadow is mildly prominent but accentuated by the portable technique. The overall inspiratory effort is poor. Minimal blunting of the left costophrenic angle is noted consistent with small effusion. No focal infiltrate is noted. No bony abnormality is seen. IMPRESSION: Minimal left pleural effusion.  No other focal abnormality is noted. Electronically Signed   By: Inez Catalina M.D.   On: 09/07/2020 20:30   CT HEAD WO CONTRAST  Result Date: 09/07/2020 CLINICAL DATA:  Altered mental status EXAM: CT HEAD WITHOUT CONTRAST TECHNIQUE: Contiguous axial images were obtained from the base of the skull through the vertex without intravenous contrast. COMPARISON:  None. FINDINGS: Brain: Image quality is degraded by motion. Mild cerebral atrophy. No acute intracranial abnormality. Specifically, no hemorrhage, hydrocephalus, mass lesion, acute infarction, or significant intracranial injury. Vascular: No hyperdense vessel or unexpected calcification. Skull: No acute calvarial abnormality. Sinuses/Orbits: No acute findings Other: None IMPRESSION: Mild image quality degradation due to motion. No visible acute intracranial abnormality. Electronically Signed   By: Rolm Baptise M.D.   On: 09/07/2020 20:38   US LIVER DOPPLER  Result Date: 09/08/2020 CLINICAL DATA:  45 year old male with a history of prior tips 07/26/2019 and abdominal swelling EXAM: DUPLEX ULTRASOUND OF LIVER TECHNIQUE: Color and duplex Doppler  ultrasound was performed to evaluate the hepatic in-flow and out-flow vessels. COMPARISON:  02/14/2020 FINDINGS: Portal Vein Velocities Main:  43 cm/sec Right:  45 cm/sec Left:  32 cm/sec Tips Proximal: 85 centimeter/second Mid: 104 centimeter/second Distal: 186 centimeter/second Hepatic Vein Velocities Right:  117 cm/sec Middle:  17 cm/sec Left:   26 cm/sec Hepatic Artery Velocity:  118 cm/sec Splenic Vein Velocity:  28 cm/sec Varices: None visualized Ascites: Ascites present Cirrhotic changes of the liver IMPRESSION: Directed duplex demonstrates patent TIPS with low resistance waveform. Ascites present. Signed, Dulcy Fanny. Dellia Nims, RPVI [facility] Electronically Signed   By: Corrie Mckusick D.O.   On: 09/08/2020 16:14   VAS [place] LOWER EXTREMITY VENOUS (DVT)  Result Date: 09/09/2020  Lower Venous DVT Study Patient Name:  Chevy FAIN [place]  Date of Exam:   09/09/2020 Medical Rec #: [id number]             Accession #:    [id number] Date of Birth: [birth year]              Patient Gender: M Patient Age:   45Y Exam Location:  [facility] Procedure:      VAS [place] LOWER EXTREMITY VENOUS (DVT) Referring Phys: [id number] PARDEEP KUMAR --------------------------------------------------------------------------------  Indications: Swelling in patient with portal HTN, TIPS.  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 Comparison Study: 07-01-2019 Prior bilateral lower extremity venous study was                   negative for DVT. Performing Technologist: Darlin Coco RDMS,RVT  Examination Guidelines: A complete evaluation includes B-mode imaging, spectral Doppler, color Doppler, and power Doppler as needed of all accessible portions of each vessel. Bilateral testing is considered an integral part of a complete examination. Limited examinations for reoccurring indications may be performed as noted. The reflux portion of the exam is performed with the patient in reverse Trendelenburg.  +---------+---------------+---------+-----------+----------+--------------+ RIGHT    CompressibilityPhasicitySpontaneityPropertiesThrombus Aging +---------+---------------+---------+-----------+----------+--------------+ CFV      Full           Yes      Yes                                  +---------+---------------+---------+-----------+----------+--------------+ SFJ      Full                                                        +---------+---------------+---------+-----------+----------+--------------+ FV Prox  Full                                                        +---------+---------------+---------+-----------+----------+--------------+ FV Mid   Full                                                        +---------+---------------+---------+-----------+----------+--------------+ FV DistalFull                                                        +---------+---------------+---------+-----------+----------+--------------+  PFV      Full                                                        +---------+---------------+---------+-----------+----------+--------------+ POP      Full           Yes      Yes                                 +---------+---------------+---------+-----------+----------+--------------+ PTV      Full                                                        +---------+---------------+---------+-----------+----------+--------------+ PERO     Full                                                        +---------+---------------+---------+-----------+----------+--------------+ Gastroc  Full                                                        +---------+---------------+---------+-----------+----------+--------------+   +---------+---------------+---------+-----------+----------+--------------+ LEFT     CompressibilityPhasicitySpontaneityPropertiesThrombus Aging +---------+---------------+---------+-----------+----------+--------------+ CFV      Full           Yes      Yes                                 +---------+---------------+---------+-----------+----------+--------------+ SFJ      Full                                                         +---------+---------------+---------+-----------+----------+--------------+ FV Prox  Full                                                        +---------+---------------+---------+-----------+----------+--------------+ FV Mid   Full                                                        +---------+---------------+---------+-----------+----------+--------------+ FV DistalFull                                                        +---------+---------------+---------+-----------+----------+--------------+  PFV      Full                                                        +---------+---------------+---------+-----------+----------+--------------+ POP      Full           Yes      Yes                                 +---------+---------------+---------+-----------+----------+--------------+ PTV      Full                                                        +---------+---------------+---------+-----------+----------+--------------+ PERO     Full                                                        +---------+---------------+---------+-----------+----------+--------------+ Gastroc  Full                                                        +---------+---------------+---------+-----------+----------+--------------+    Summary: RIGHT: - There is no evidence of deep vein thrombosis in the lower extremity.  - No cystic structure found in the popliteal fossa.  LEFT: - There is no evidence of deep vein thrombosis in the lower extremity.  - No cystic structure found in the popliteal fossa.  *See table(s) above for measurements and observations.    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  His TIPS shunt is patent by Doppler ultrasound.  He had a relapse in sobriety and this led to decompensation and likely an acute portal hypertension in the setting of a mild alcoholic hepatitis.  With TIPS patency I presume portal hypertension will improve if he can remain alcohol free.  We discussed this at length today. --Continue low-sodium diet --Continue Lasix 40 mg and spironolactone 100 mg daily; over time will likely reduce dose of diuretics given TIPS shunt in place and the expectation that portal hypertension will hopefully be transient if he can remain alcohol free; close attention to renal function on diuretics --Need to correct electrolytes including potassium, magnesium and phosphorus per primary team --Reduce lactulose doses as he is having diarrhea; changed to 10 mg twice daily --Needs HCC screening; could be deferred to the outpatient setting but reasonable to perform liver ultrasound while here, check AFP --Trend liver enzymes and INR --CIWA protocol, Ativan ordered --We will  need to follow-up with Dr. Havery Moros after discharge --We will follow-up; I told him I expect he will need at least 1 more night in the hospital  2.  Restlessness --likely more related to alcohol withdrawal, Ativan as ordered which I encouraged him to request particularly nightly to help with sleep and rest overnight  3.  Celiac disease --gluten-free diet; low-sodium diet        LOS: 2 days   Lajuan Lines Braelyn Jenson  09/09/2020, 10:12 AM [phone]

## 2020-09-10 ENCOUNTER — Telehealth: Payer: Self-pay

## 2020-09-10 LAB — COMPREHENSIVE METABOLIC PANEL
ALT: 42 U/L (ref 0–44)
ALT: 43 U/L (ref 0–44)
AST: 120 U/L — ABNORMAL HIGH (ref 15–41)
AST: 115 U/L — ABNORMAL HIGH (ref 15–41)
Albumin: 2.3 g/dL — ABNORMAL LOW (ref 3.5–5.0)
Albumin: 2.5 g/dL — ABNORMAL LOW (ref 3.5–5.0)
Alkaline Phosphatase: 143 U/L — ABNORMAL HIGH (ref 38–126)
Alkaline Phosphatase: 149 U/L — ABNORMAL HIGH (ref 38–126)
Anion gap: 7 (ref 5–15)
Anion gap: 6 (ref 5–15)
BUN: 8 mg/dL (ref 6–20)
BUN: 7 mg/dL (ref 6–20)
CO2: 29 mmol/L (ref 22–32)
CO2: 28 mmol/L (ref 22–32)
Calcium: 7.7 mg/dL — ABNORMAL LOW (ref 8.9–10.3)
Calcium: 8.1 mg/dL — ABNORMAL LOW (ref 8.9–10.3)
Chloride: 96 mmol/L — ABNORMAL LOW (ref 98–111)
Chloride: 95 mmol/L — ABNORMAL LOW (ref 98–111)
Creatinine, Ser: 0.55 mg/dL — ABNORMAL LOW (ref 0.61–1.24)
Creatinine, Ser: 0.62 mg/dL (ref 0.61–1.24)
GFR, Estimated: >60 mL/min (ref >60)
GFR, Estimated: >60 mL/min (ref >60)
Glucose, Bld: 114 mg/dL — ABNORMAL HIGH (ref 70–99)
Glucose, Bld: 127 mg/dL — ABNORMAL HIGH (ref 70–99)
Potassium: 3 mmol/L — ABNORMAL LOW (ref 3.5–5.1)
Potassium: 3.4 mmol/L — ABNORMAL LOW (ref 3.5–5.1)
Sodium: 132 mmol/L — ABNORMAL LOW (ref 135–145)
Sodium: 129 mmol/L — ABNORMAL LOW (ref 135–145)
Total Bilirubin: 6.6 mg/dL — ABNORMAL HIGH (ref 0.3–1.2)
Total Bilirubin: 5.2 mg/dL — ABNORMAL HIGH (ref 0.3–1.2)
Total Protein: 6.1 g/dL — ABNORMAL LOW (ref 6.5–8.1)
Total Protein: 6.3 g/dL — ABNORMAL LOW (ref 6.5–8.1)

## 2020-09-10 LAB — CBC
HCT: 34.1 % — ABNORMAL LOW (ref 39.0–52.0)
HCT: 35.7 % — ABNORMAL LOW (ref 39.0–52.0)
Hemoglobin: 11.8 g/dL — ABNORMAL LOW (ref 13.0–17.0)
Hemoglobin: 12.2 g/dL — ABNORMAL LOW (ref 13.0–17.0)
MCH: 35.4 pg — ABNORMAL HIGH (ref 26.0–34.0)
MCH: 35.4 pg — ABNORMAL HIGH (ref 26.0–34.0)
MCHC: 34.6 g/dL (ref 30.0–36.0)
MCHC: 34.2 g/dL (ref 30.0–36.0)
MCV: 102.4 fL — ABNORMAL HIGH (ref 80.0–100.0)
MCV: 103.5 fL — ABNORMAL HIGH (ref 80.0–100.0)
Platelets: 96 10*3/uL — ABNORMAL LOW (ref 150–400)
Platelets: 105 10*3/uL — ABNORMAL LOW (ref 150–400)
RBC: 3.33 MIL/uL — ABNORMAL LOW (ref 4.22–5.81)
RBC: 3.45 MIL/uL — ABNORMAL LOW (ref 4.22–5.81)
RDW: 16.1 % — ABNORMAL HIGH (ref 11.5–15.5)
RDW: 16.2 % — ABNORMAL HIGH (ref 11.5–15.5)
WBC: 4.8 10*3/uL (ref 4.0–10.5)
WBC: 5.6 10*3/uL (ref 4.0–10.5)
nRBC: 0 % (ref 0.0–0.2)
nRBC: 0 % (ref 0.0–0.2)

## 2020-09-10 LAB — BLOOD CULTURE ID PANEL (REFLEXED) - BCID2

## 2020-09-10 LAB — MAGNESIUM
Magnesium: 1.6 mg/dL — ABNORMAL LOW (ref 1.7–2.4)
Magnesium: 1.8 mg/dL (ref 1.7–2.4)

## 2020-09-10 LAB — AFP TUMOR MARKER: AFP-Tumor Marker: 5.6 ng/mL (ref <6.1)

## 2020-09-10 LAB — PHOSPHORUS
Phosphorus: 2.8 mg/dL (ref 2.5–4.6)
Phosphorus: 2.5 mg/dL (ref 2.5–4.6)

## 2020-09-10 LAB — PROTIME-INR
INR: 1.6 — ABNORMAL HIGH (ref 0.8–1.2)
Prothrombin Time: 18.8 seconds — ABNORMAL HIGH (ref 11.4–15.2)

## 2020-09-10 MED ORDER — THIAMINE HCL 100 MG/ML IJ SOLN
100.0000 mg | Freq: Every day | INTRAMUSCULAR | Status: DC
  Filled 2020-09-10: qty 2

## 2020-09-10 MED ORDER — LORAZEPAM 2 MG/ML IJ SOLN
1.0000 mg | INTRAMUSCULAR | Status: DC | PRN
  Administered 2020-09-10: 1 mg via INTRAVENOUS
  Filled 2020-09-10: qty 1

## 2020-09-10 MED ORDER — POTASSIUM CHLORIDE 20 MEQ PO PACK
40.0000 meq | PACK | Freq: Once | ORAL | Status: AC
  Administered 2020-09-10: 40 meq via ORAL
  Filled 2020-09-10: qty 2

## 2020-09-10 MED ORDER — METOPROLOL TARTRATE 5 MG/5ML IV SOLN: 2.5000 mg | Freq: Four times a day (QID) | INTRAVENOUS | Status: DC | PRN

## 2020-09-10 MED ORDER — PROCHLORPERAZINE EDISYLATE 10 MG/2ML IJ SOLN: 5.0000 mg | Freq: Four times a day (QID) | INTRAMUSCULAR | Status: DC | PRN

## 2020-09-10 MED ORDER — MAGNESIUM SULFATE 2 GM/50ML IV SOLN
2.0000 g | Freq: Once | INTRAVENOUS | Status: AC
  Administered 2020-09-10: 2 g via INTRAVENOUS
  Filled 2020-09-10: qty 50

## 2020-09-10 MED ORDER — THIAMINE HCL 100 MG PO TABS
100.0000 mg | ORAL_TABLET | Freq: Every day | ORAL | Status: DC
  Administered 2020-09-10 – 2020-09-11 (×2): 100 mg via ORAL
  Filled 2020-09-10: qty 1

## 2020-09-10 MED ORDER — ADULT MULTIVITAMIN W/MINERALS CH
1.0000 | ORAL_TABLET | Freq: Every day | ORAL | Status: DC
  Administered 2020-09-10 – 2020-09-11 (×2): 1 via ORAL
  Filled 2020-09-10: qty 1

## 2020-09-10 MED ORDER — FOLIC ACID 1 MG PO TABS
1.0000 mg | ORAL_TABLET | Freq: Every day | ORAL | Status: DC
  Administered 2020-09-10 – 2020-09-11 (×2): 1 mg via ORAL
  Filled 2020-09-10: qty 1

## 2020-09-10 MED ORDER — MAGNESIUM SULFATE 4 GM/100ML IV SOLN
4.0000 g | Freq: Once | INTRAVENOUS | Status: DC
  Filled 2020-09-10: qty 100

## 2020-09-10 MED ORDER — LORAZEPAM 1 MG PO TABS: 1.0000 mg | ORAL_TABLET | ORAL | Status: DC | PRN

## 2020-09-10 NOTE — Progress Notes (Signed)
[facility] Progress Note  CC:  Decompensated alcohol-related cirrhosis  Subjective: He slept well last night. No agitation at this time. No confusion. Appetite is good. No N/V. He is having less frequent Bms after Lactulose dose was reduced. He passed a loose yellow brown stool at 2am and 6am this morning. He feels his abdominal distension has significantly reduced. No further LE edema. He last drank 1/2 gallon of Vodka on Friday 09/07/2020. His wife is at the bedside.   Objective:  Vital signs in last 24 hours: Temp:  [98.3 F (36.8 C)-99.5 F (37.5 C)] 98.7 F (37.1 C) (06/13 0442) Pulse Rate:  [92-102] 94 (06/13 0442) Resp:  [16] 16 (06/13 0442) BP: (137-155)/(88-97) 149/97 (06/13 0442) SpO2:  [95 %-97 %] 95 % (06/13 0442) Weight:  [90.3 kg] 90.3 kg (06/13 0500) Last BM Date: 09/09/20  General:   45 year old male in NAD.  Eyes: Mild scleral icterus. Heart: Regular rate and rhythm, no murmurs. Pulm: Breath sounds clear throughout. Abdomen: Soft, nondistended.  No obvious ascites on exam.  Positive bowel sounds all 4 quadrants.  No mass. Extremities: Lower extremities without edema. Neurologic:  Alert and  oriented x4.  Very slight tremor to his upper extremities, no asterixis. Psych:  Alert and cooperative. Normal mood and affect.  Intake/Output from previous day: No intake/output data recorded. Intake/Output this shift: No intake/output data recorded.  Lab Results: Recent Labs    09/08/20 0536 09/09/20 0521 09/10/20 0521  WBC 3.0* 4.1 4.8  HGB 11.4* 12.0* 11.8*  HCT 32.9* 34.5* 34.1*  PLT 117* 106* 96*   BMET Recent Labs    09/08/20 0536 09/09/20 0521 09/10/20 0521  NA 142 133* 132*  K 3.0* 2.9* 3.0*  CL 100 93* 96*  CO2 32 30 29  GLUCOSE 136* 123* 114*  BUN <5* <5* 8  CREATININE 0.44* 0.51* 0.55*  CALCIUM 7.4* 7.7* 7.7*   LFT Recent Labs    09/08/20 0536 09/09/20 0521 09/10/20 0521  PROT 6.0*   < > 6.1*  ALBUMIN 2.5*   < >  2.3*  AST 159*   < > 120*  ALT 51*   < > 42  ALKPHOS 139*   < > 143*  BILITOT 4.1*   < > 6.6*  BILIDIR 1.9*  --   --    < > = values in this interval not displayed.   PT/INR Recent Labs    09/09/20 0521 09/10/20 0521  LABPROT 17.8* 18.8*  INR 1.5* 1.6*   Hepatitis Panel No results for input(s): HEPBSAG, HCVAB, HEPAIGM, HEPBIGM in the last 72 hours.  US LIVER DOPPLER  Result Date: 09/08/2020 CLINICAL DATA:  45 year old male with a history of prior tips 07/26/2019 and abdominal swelling EXAM: DUPLEX ULTRASOUND OF LIVER TECHNIQUE: Color and duplex Doppler ultrasound was performed to evaluate the hepatic in-flow and out-flow vessels. COMPARISON:  02/14/2020 FINDINGS: Portal Vein Velocities Main:  43 cm/sec Right:  45 cm/sec Left:  32 cm/sec Tips Proximal: 85 centimeter/second Mid: 104 centimeter/second Distal: 186 centimeter/second Hepatic Vein Velocities Right:  117 cm/sec Middle:  17 cm/sec Left:  26 cm/sec Hepatic Artery Velocity:  118 cm/sec Splenic Vein Velocity:  28 cm/sec Varices: None visualized Ascites: Ascites present Cirrhotic changes of the liver IMPRESSION: Directed duplex demonstrates patent TIPS with low resistance waveform. Ascites present. Signed, Dulcy Fanny. Dellia Nims, RPVI [facility] Rad[facility] Electronically Signed   By: Corrie Mckusick D.O.   On: 09/08/2020 16:14  VAS [place] LOWER EXTREMITY VENOUS (DVT)  Result Date: 09/09/2020  Lower Venous DVT Study Patient Name:  Nickolas FAIN [place]  Date of Exam:   09/09/2020 Medical Rec #: [id number]             Accession #:    [id number] Date of Birth: 26-May-1975              Patient Gender: M Patient Age:   45Y Exam Location:  [facility] Procedure:      VAS [place] LOWER EXTREMITY VENOUS (DVT) Referring Phys: [id number] PARDEEP KUMAR --------------------------------------------------------------------------------  Indications: Swelling in patient with portal HTN, TIPS.  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 Comparison Study: 07-01-2019 Prior bilateral lower extremity venous study was                   negative for DVT. Performing Technologist: Darlin Coco RDMS,RVT  Examination Guidelines: A complete evaluation includes B-mode imaging, spectral Doppler, color Doppler, and power Doppler as needed of all accessible portions of each vessel. Bilateral testing is considered an integral part of a complete examination. Limited examinations for reoccurring indications may be performed as noted. The reflux portion of the exam is performed with the patient in reverse Trendelenburg.  +---------+---------------+---------+-----------+----------+--------------+ RIGHT    CompressibilityPhasicitySpontaneityPropertiesThrombus Aging +---------+---------------+---------+-----------+----------+--------------+ CFV      Full           Yes      Yes                                 +---------+---------------+---------+-----------+----------+--------------+ SFJ      Full                                                        +---------+---------------+---------+-----------+----------+--------------+ FV Prox  Full                                                        +---------+---------------+---------+-----------+----------+--------------+ FV Mid   Full                                                        +---------+---------------+---------+-----------+----------+--------------+ FV DistalFull                                                        +---------+---------------+---------+-----------+----------+--------------+ PFV      Full                                                        +---------+---------------+---------+-----------+----------+--------------+ POP      Full           Yes  Yes                                 +---------+---------------+---------+-----------+----------+--------------+ PTV      Full                                                         +---------+---------------+---------+-----------+----------+--------------+ PERO     Full                                                        +---------+---------------+---------+-----------+----------+--------------+ Gastroc  Full                                                        +---------+---------------+---------+-----------+----------+--------------+   +---------+---------------+---------+-----------+----------+--------------+ LEFT     CompressibilityPhasicitySpontaneityPropertiesThrombus Aging +---------+---------------+---------+-----------+----------+--------------+ CFV      Full           Yes      Yes                                 +---------+---------------+---------+-----------+----------+--------------+ SFJ      Full                                                        +---------+---------------+---------+-----------+----------+--------------+ FV Prox  Full                                                        +---------+---------------+---------+-----------+----------+--------------+ FV Mid   Full                                                        +---------+---------------+---------+-----------+----------+--------------+ FV DistalFull                                                        +---------+---------------+---------+-----------+----------+--------------+ PFV      Full                                                        +---------+---------------+---------+-----------+----------+--------------+ POP      Full  Yes      Yes                                 +---------+---------------+---------+-----------+----------+--------------+ PTV      Full                                                        +---------+---------------+---------+-----------+----------+--------------+ PERO     Full                                                         +---------+---------------+---------+-----------+----------+--------------+ Gastroc  Full                                                        +---------+---------------+---------+-----------+----------+--------------+    Summary: RIGHT: - There is no evidence of deep vein thrombosis in the lower extremity.  - No cystic structure found in the popliteal fossa.  LEFT: - There is no evidence of deep vein thrombosis in the lower extremity.  - No cystic structure found in the popliteal fossa.  *See table(s) above for measurements and observations.    Preliminary    US Abdomen Limited RUQ (LIVER/GB)  Result Date: 09/09/2020 CLINICAL DATA:  Cirrhosis.  Follow-up. EXAM: ULTRASOUND ABDOMEN LIMITED RIGHT UPPER QUADRANT COMPARISON:  September 08, 2020 FINDINGS: Gallbladder: No gallstones or wall thickening visualized. No sonographic Murphy sign noted by sonographer. Common bile duct: Diameter: 2 mm Liver: Nodular contour consistent with cirrhosis. Heterogeneous echotexture. Portal vein is patent on color Doppler imaging with normal direction of blood flow towards the liver. Other: Ascites identified in the right upper quadrant. IMPRESSION: Cirrhotic liver. Ascites in the right upper quadrant. No other abnormalities. Electronically Signed   By: Dorise Bullion III M.D   On: 09/09/2020 15:54    Assessment / Plan:  45 year old male with decompensated EtOH induced cirrhosis/prior TIPS placement/ongoing alcohol abuse/volume overload/hepatic hydrothorax and encephalopathy.  TIPS shunt patent per doppler sono. RUQ sono 6/12 consistent with cirrhosis, ascites in the RUQ  (unlikely enough ascites to tap) and a patent porta vein. MELD 19. MELD-Na 22. MDF < 32. Total bili 5.9 -> 6.6. Alk phos 146 -> 143. AST 151 -> 143. ALT 51 -> 41. AFP pending. No overt signs of hepatic encephalopathy at this time.  -Continue Lasix 76m QD and Spironolactone 1013mQD -Continue 2GM low sodium diet  -Continue Lactulose 10 GM po bid,  titrate to 3 to 4 Bms daily -BMP, Hepatic panel in am -No alcohol ever discussed with the patient. Alcohol rehab strongly recommended.  -Patient has outpatient follow up in our GI clinic with AmNicoletta Ban Wed 09/12/2020 at 1:30pm -Cautious use of Lorazepam for anxiety/signs of EtOH withdrawal -No NSAIDs -Continue Folate, Thiamin and MVI -Further recommendations per Dr. BeTarri Glenn 2. Macrocytic anemia secondary to cirrhosis and likely bone marrow suppression associated with alcohol use. No overt GI bleeding. Hg 12.0 -> 11.8. MCV 102.4.  -  CBC, iron panel, B12 and folate level in am  3. Thrombocytopenia secondary to cirrhosis. No splenomegaly per CTAP 06/2019.   4. Hyponatremia secondary to cirrhosis and diuretic use. Normal renal function.  -BMP in am -Defer any adjustment to diuretic regimen to Dr. Tarri Glenn   5. Coagulopathy secondary to cirrhosis. INR 1.5 -> 1.6.  -INR in am  6 Hypokalemia. K + 3.0.  -KCL replacement per the hospitalist   7. Celiac disease  -Continue gluten free diet    Principal Problem:   Acute encephalopathy Active Problems:   Cirrhosis (HCC)   Acute alcohol intoxication ([place])   Alcohol abuse with intoxication ([place])   Lactic acidosis   Hypokalemia   Allergic rhinitis     LOS: 3 days   Noralyn Pick  09/10/2020, 8:28 AM

## 2020-09-10 NOTE — Progress Notes (Signed)
   09/10/20 2144  Assess: MEWS Score  Temp 98.4 F (36.9 C)  BP 138/89  Pulse Rate (!) 115  ECG Heart Rate (!) 114  Resp 18  Level of Consciousness Alert  SpO2 98 %  O2 Device Room Air  Assess: MEWS Score  MEWS Temp 0  MEWS Systolic 0  MEWS Pulse 2  MEWS RR 0  MEWS LOC 0  MEWS Score 2  MEWS Score Color Yellow  Assess: if the MEWS score is Yellow or Red  Were vital signs taken at a resting state? Yes  Focused Assessment No change from prior assessment  Does the patient meet 2 or more of the SIRS criteria? No  MEWS guidelines implemented *See Row Information* Yes  Treat  MEWS Interventions Administered prn meds/treatments  Pain Scale 0-10  Pain Score 0  Document  Patient Outcome Stabilized after interventions  Assess: SIRS CRITERIA  SIRS Temperature  0  SIRS Pulse 1  SIRS Respirations  0  SIRS WBC 0  SIRS Score Sum  1

## 2020-09-10 NOTE — Progress Notes (Signed)
PROGRESS NOTE    Shawn Martin  KNL:[id number] DOB: [birth year] DOA: 09/07/2020 PCP: [name], MD   Brief Narrative:  This 45 y.o. male with PMH significant for alcoholic cirrhosis status post TIPS procedure in April 2021, allergic rhinitis, chronic hypokalemia, who is admitted with acute encephalopathy after presenting from home for the evaluation of altered mental status.  Wife reports that the patient completely stopped all alcohol consumption immediately following TIPS procedure and as far as she is aware, the patient has remained abstinent to alcohol in the interval. Patient has been noncompliant with lactulose and diuretic regimen. GI was consulted, Patient was given lactulose.  Acute encephalopathy has improved.    Assessment & Plan:   Principal Problem:   Acute encephalopathy Active Problems:   Cirrhosis ([facility])   Acute alcohol intoxication ([place])   Alcohol abuse with intoxication ([place])   Lactic acidosis   Hypokalemia   Allergic rhinitis  Acute encephalopathy: Suspect this is multifactorial in nature. It appears like patient has resumed drinking alcohol. Serum ETOH level found to be 500.   Hepatic encaph due to noncompliance with lactulose. GI consulted recommended follow-up liver Doppler. Continue diuretics , continue lactulose, low-sodium diet. Patient is alert and oriented back to his baseline mental status. Reduce lactulose 10 mg every 12 since he has developed diarrhea.  Alcoholic cirrhosis: Patient underwent TIPS procedure in April 2021. Continued to drink alcohol.  Noncompliant with his medication regimen. Resume Lasix and spironolactone regimen.  Alcohol intoxication: Continue thiamine and folic acid. Continue CIWA protocol.  Lactic acidosis: No source of infection found.  This could be multifactorial. This could be due to alcohol intoxication.  Hypokalemia/ hypomagnesemia: Replacement in progress, continue to monitor.   DVT prophylaxis:   Code Status:  Full code. Family Communication:  Wife at bed side. Disposition Plan:   Status is: Inpatient  Remains inpatient appropriate because:Inpatient level of care appropriate due to severity of illness  Dispo: The patient is from: Home              Anticipated d/c is to: Home 6/14              Patient currently is not medically stable to d/c.   Difficult to place patient No  Consultants:  GI  Procedures:  Antimicrobials:  Anti-infectives (From admission, onward)    None        Subjective: Patient was seen and examined at bedside.  Overnight events noted.  Patient reports feeling better He is alert and oriented , back to his baseline mental status.  He has taken shower today.  Objective: Vitals:   09/09/20 1412 09/09/20 2039 09/10/20 0442 09/10/20 0500  BP: 137/88 (!) 155/88 (!) 149/97   Pulse: 92 (!) 102 94   Resp: 16 16 16    Temp: 99.5 F (37.5 C) 98.3 F (36.8 C) 98.7 F (37.1 C)   TempSrc: Oral Oral Oral   SpO2: 96% 97% 95%   Weight:    90.3 kg   No intake or output data in the 24 hours ending 09/10/20 1502 Filed Weights   09/09/20 0354 09/10/20 0500  Weight: 93.8 kg 90.3 kg    Examination:  General exam: Appears calm and comfortable , not in any acute distress. Respiratory system: Clear to auscultation. Respiratory effort normal. Cardiovascular system: S1 & S2 heard, RRR. No JVD, murmurs, rubs, gallops or clicks. No pedal edema. Gastrointestinal system: Abdomen is nondistended, soft and nontender. No organomegaly or masses felt. Normal bowel sounds heard. Central  nervous system: Alert and oriented. No focal neurological deficits. Extremities: Symmetric 5 x 5 power.   No edema, no cyanosis, no clubbing. Skin: No rashes, lesions or ulcers Psychiatry: Judgement and insight appear normal. Mood & affect appropriate.     Data Reviewed: I have personally reviewed following labs and imaging studies  CBC: Recent Labs  Lab 09/07/20 1001  09/07/20 1934 09/08/20 0536 09/09/20 0521 09/10/20 0521  WBC 3.3* 7.0 3.0* 4.1 4.8  NEUTROABS 1.5 1.8 1.0*  --   --   HGB 13.3 13.4 11.4* 12.0* 11.8*  HCT 37.5* 38.7* 32.9* 34.5* 34.1*  MCV 103.5* 102.1* 101.9* 101.2* 102.4*  PLT 131.0* 136* 117* 106* 96*    Basic Metabolic Panel: Recent Labs  Lab 09/07/20 1001 09/07/20 1934 09/07/20 1939 09/08/20 0536 09/09/20 0521 09/10/20 0521  NA 144 140  --  142 133* 132*  K 3.0* 3.0*  --  3.0* 2.9* 3.0*  CL 101 97*  --  100 93* 96*  CO2 32 31  --  32 30 29  GLUCOSE 150* 142*  --  136* 123* 114*  BUN 4* <5*  --  <5* <5* 8  CREATININE 0.58 0.60*  --  0.44* 0.51* 0.55*  CALCIUM 8.1* 8.3*  --  7.4* 7.7* 7.7*  MG  --   --  1.8 1.7 1.5* 1.6*  PHOS  --   --   --  3.4 2.4* 2.8    GFR: Estimated Creatinine Clearance: 125.3 mL/min (A) (by C-G formula based on SCr of 0.55 mg/dL (L)). Liver Function Tests: Recent Labs  Lab 09/07/20 1001 09/07/20 1934 09/08/20 0536 09/09/20 0521 09/10/20 0521  AST 154* 180* 159* 151* 120*  ALT 55* 61* 51* 51* 42  ALKPHOS 174* 172* 139* 146* 143*  BILITOT 4.9* 4.6* 4.1* 5.9* 6.6*  PROT 7.1 7.5 6.0* 6.6 6.1*  ALBUMIN 3.2* 3.0* 2.5* 2.6* 2.3*    No results for input(s): LIPASE, AMYLASE in the last 168 hours. No results for input(s): AMMONIA in the last 168 hours. Coagulation Profile: Recent Labs  Lab 09/07/20 1001 09/08/20 0536 09/09/20 0521 09/10/20 0521  INR 1.4* 1.5* 1.5* 1.6*    Cardiac Enzymes: No results for input(s): CKTOTAL, CKMB, CKMBINDEX, TROPONINI in the last 168 hours. BNP (last 3 results) No results for input(s): PROBNP in the last 8760 hours. HbA1C: No results for input(s): HGBA1C in the last 72 hours. CBG: No results for input(s): GLUCAP in the last 168 hours. Lipid Profile: No results for input(s): CHOL, HDL, LDLCALC, TRIG, CHOLHDL, LDLDIRECT in the last 72 hours. Thyroid Function Tests: Recent Labs    09/08/20 0536  TSH 2.201    Anemia Panel: No results for  input(s): VITAMINB12, FOLATE, FERRITIN, TIBC, IRON, RETICCTPCT in the last 72 hours. Sepsis Labs: Recent Labs  Lab 09/07/20 1934 09/08/20 0536  LATICACIDVEN 2.6* 2.8*     Recent Results (from the past 240 hour(s))  Blood Cultures (routine x 2)     Status: None (Preliminary result)   Collection Time: 09/07/20  7:34 PM   Specimen: BLOOD  Result Value Ref Range Status   Specimen Description   Final    BLOOD SITE NOT SPECIFIED Performed at [facility] [address], [place], [place] [address]    Special Requests   Final    BOTTLES DRAWN AEROBIC ONLY Blood Culture adequate volume Performed at [facility] [address], [place], [place] [address]    Culture   Final    NO  GROWTH 2 DAYS Performed at [facility], [place] [address] [place], [place] [address]    Report Status PENDING  Incomplete  Resp Panel by RT-PCR (Flu A&B, Covid) Nasopharyngeal Swab     Status: None   Collection Time: 09/07/20 10:51 PM   Specimen: Nasopharyngeal Swab; Nasopharyngeal(NP) swabs in vial transport medium  Result Value Ref Range Status   SARS Coronavirus 2 by RT PCR NEGATIVE NEGATIVE Final    Comment: (NOTE) SARS-CoV-2 target nucleic acids are NOT DETECTED.  The SARS-CoV-2 RNA is generally detectable in upper respiratory specimens during the acute phase of infection. The lowest concentration of SARS-CoV-2 viral copies this assay can detect is 138 copies/mL. A negative result does not preclude SARS-Cov-2 infection and should not be used as the sole basis for treatment or other patient management decisions. A negative result may occur with  improper specimen collection/handling, submission of specimen other than nasopharyngeal swab, presence of viral mutation(s) within the areas targeted by this assay, and inadequate number of viral copies(<138 copies/mL). A negative result must be combined with clinical observations, patient history, and  epidemiological information. The expected result is Negative.  Fact Sheet for Patients:  EntrepreneurPulse.com.au  Fact Sheet for Healthcare Providers:  IncredibleEmployment.be  This test is no t yet approved or cleared by the [place] FDA and  has been authorized for detection and/or diagnosis of SARS-CoV-2 by FDA under an Emergency Use Authorization (EUA). This EUA will remain  in effect (meaning this test can be used) for the duration of the COVID-19 declaration under Section 564(b)(1) of the Act, 21 U.S.C.section 360bbb-3(b)(1), unless the authorization is terminated  or revoked sooner.       Influenza A by PCR NEGATIVE NEGATIVE Final   Influenza B by PCR NEGATIVE NEGATIVE Final    Comment: (NOTE) The Xpert Xpress SARS-CoV-2/FLU/RSV plus assay is intended as an aid in the diagnosis of influenza from Nasopharyngeal swab specimens and should not be used as a sole basis for treatment. Nasal washings and aspirates are unacceptable for Xpert Xpress SARS-CoV-2/FLU/RSV testing.  Fact Sheet for Patients: EntrepreneurPulse.com.au  Fact Sheet for Healthcare Providers: IncredibleEmployment.be  This test is not yet approved or cleared by the [place] FDA and has been authorized for detection and/or diagnosis of SARS-CoV-2 by FDA under an Emergency Use Authorization (EUA). This EUA will remain in effect (meaning this test can be used) for the duration of the COVID-19 declaration under Section 564(b)(1) of the Act, 21 U.S.C. section 360bbb-3(b)(1), unless the authorization is terminated or revoked.  Performed at [facility], [place] [address], [place], [place] [address]       Radiology Studies: VAS [place] LOWER EXTREMITY VENOUS (DVT)  Result Date: 09/10/2020  Lower Venous DVT Study Patient Name:  Dreshaun FAIN Martin  Date of Exam:   09/09/2020 Medical Rec #: [id number]              Accession #:    [id number] Date of Birth: 04/27/1975              Patient Gender: M Patient Age:   45Y Exam Location:  [facility] Procedure:      VAS [place] LOWER EXTREMITY VENOUS (DVT) Referring Phys: [id number] Mira Balon --------------------------------------------------------------------------------  Indications: Swelling in patient with portal HTN, TIPS.  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 Comparison Study: 07-01-2019 Prior bilateral lower extremity venous study was                   negative  for DVT. Performing Technologist: Darlin Coco RDMS,RVT  Examination Guidelines: A complete evaluation includes B-mode imaging, spectral Doppler, color Doppler, and power Doppler as needed of all accessible portions of each vessel. Bilateral testing is considered an integral part of a complete examination. Limited examinations for reoccurring indications may be performed as noted. The reflux portion of the exam is performed with the patient in reverse Trendelenburg.  +---------+---------------+---------+-----------+----------+--------------+ RIGHT    CompressibilityPhasicitySpontaneityPropertiesThrombus Aging +---------+---------------+---------+-----------+----------+--------------+ CFV      Full           Yes      Yes                                 +---------+---------------+---------+-----------+----------+--------------+ SFJ      Full                                                        +---------+---------------+---------+-----------+----------+--------------+ FV Prox  Full                                                        +---------+---------------+---------+-----------+----------+--------------+ FV Mid   Full                                                        +---------+---------------+---------+-----------+----------+--------------+ FV DistalFull                                                         +---------+---------------+---------+-----------+----------+--------------+ PFV      Full                                                        +---------+---------------+---------+-----------+----------+--------------+ POP      Full           Yes      Yes                                 +---------+---------------+---------+-----------+----------+--------------+ PTV      Full                                                        +---------+---------------+---------+-----------+----------+--------------+ PERO     Full                                                        +---------+---------------+---------+-----------+----------+--------------+  Gastroc  Full                                                        +---------+---------------+---------+-----------+----------+--------------+   +---------+---------------+---------+-----------+----------+--------------+ LEFT     CompressibilityPhasicitySpontaneityPropertiesThrombus Aging +---------+---------------+---------+-----------+----------+--------------+ CFV      Full           Yes      Yes                                 +---------+---------------+---------+-----------+----------+--------------+ SFJ      Full                                                        +---------+---------------+---------+-----------+----------+--------------+ FV Prox  Full                                                        +---------+---------------+---------+-----------+----------+--------------+ FV Mid   Full                                                        +---------+---------------+---------+-----------+----------+--------------+ FV DistalFull                                                        +---------+---------------+---------+-----------+----------+--------------+ PFV      Full                                                         +---------+---------------+---------+-----------+----------+--------------+ POP      Full           Yes      Yes                                 +---------+---------------+---------+-----------+----------+--------------+ PTV      Full                                                        +---------+---------------+---------+-----------+----------+--------------+ PERO     Full                                                        +---------+---------------+---------+-----------+----------+--------------+  Gastroc  Full                                                        +---------+---------------+---------+-----------+----------+--------------+     Summary: RIGHT: - There is no evidence of deep vein thrombosis in the lower extremity.  - No cystic structure found in the popliteal fossa.  LEFT: - There is no evidence of deep vein thrombosis in the lower extremity.  - No cystic structure found in the popliteal fossa.  *See table(s) above for measurements and observations. Electronically signed by Jamelle Haring on 09/10/2020 at 10:51:11 AM.    Final    US Abdomen Limited RUQ (LIVER/GB)  Result Date: 09/09/2020 CLINICAL DATA:  Cirrhosis.  Follow-up. EXAM: ULTRASOUND ABDOMEN LIMITED RIGHT UPPER QUADRANT COMPARISON:  September 08, 2020 FINDINGS: Gallbladder: No gallstones or wall thickening visualized. No sonographic Murphy sign noted by sonographer. Common bile duct: Diameter: 2 mm Liver: Nodular contour consistent with cirrhosis. Heterogeneous echotexture. Portal vein is patent on color Doppler imaging with normal direction of blood flow towards the liver. Other: Ascites identified in the right upper quadrant. IMPRESSION: Cirrhotic liver. Ascites in the right upper quadrant. No other abnormalities. Electronically Signed   By: Dorise Bullion III M.D   On: 09/09/2020 15:54     Scheduled Meds:  folic acid  1 mg Oral Daily   furosemide  40 mg Oral Daily   lactulose  10 g Oral BID    multivitamin with minerals  1 tablet Oral Daily   potassium chloride  40 mEq Oral Once   spironolactone  100 mg Oral Daily   spironolactone  50 mg Oral Once   thiamine  100 mg Oral Daily   Continuous Infusions:   LOS: 3 days    Time spent: 25 mins    Shawna Clamp, MD [facility]   If 7PM-7AM, please contact night-coverage

## 2020-09-10 NOTE — Progress Notes (Signed)
   09/10/20 1935  Assess: MEWS Score  Temp 98.3 F (36.8 C)  BP (!) 142/100  Pulse Rate (!) 114  ECG Heart Rate (!) 116  Resp 18  Level of Consciousness Alert  SpO2 99 %  O2 Device Room Air  Assess: MEWS Score  MEWS Temp 0  MEWS Systolic 0  MEWS Pulse 2  MEWS RR 0  MEWS LOC 0  MEWS Score 2  MEWS Score Color Yellow  Assess: if the MEWS score is Yellow or Red  Were vital signs taken at a resting state? Yes  Focused Assessment Change from prior assessment (see assessment flowsheet)  Does the patient meet 2 or more of the SIRS criteria? No  MEWS guidelines implemented *See Row Information* Yes  Treat  MEWS Interventions Escalated (See documentation below)  Pain Scale 0-10  Pain Score 0  Take Vital Signs  Increase Vital Sign Frequency  Yellow: Q 2hr X 2 then Q 4hr X 2, if remains yellow, continue Q 4hrs  Escalate  MEWS: Escalate Yellow: discuss with charge nurse/RN and consider discussing with provider and RRT  Notify: Charge Nurse/RN  Name of Charge Nurse/RN Notified Raquel Sarna, RN  Date Charge Nurse/RN Notified 09/10/20  Time Charge Nurse/RN Notified 1946  Notify: Provider  Provider Name/Title C. [place] Crane  Date Provider Notified 09/10/20  Time Provider Notified 1950  Notification Type Page  Notification Reason Change in status  Provider response Other (Comment) (waiting for response.)  Document  Patient Outcome Not stable and remains on department  Progress note created (see row info) Yes  Assess: SIRS CRITERIA  SIRS Temperature  0  SIRS Pulse 1  SIRS Respirations  0  SIRS WBC 0  SIRS Score Sum  1

## 2020-09-10 NOTE — Telephone Encounter (Signed)
-----   Message from Yevette Edwards, RN sent at 09/07/2020  3:36 PM EDT ----- Regarding: Labs CMET, order in epic on 6/13 or 6/14

## 2020-09-10 NOTE — Progress Notes (Signed)
PHARMACY - PHYSICIAN COMMUNICATION CRITICAL VALUE ALERT - BLOOD CULTURE IDENTIFICATION (BCID)  Shawn Martin is an 45 y.o. male who presented to [facility] on 09/07/2020 with a chief complaint of AMS  Assessment:  GPC in clusters in aerobic bottle only. BCID - no organism; probably contaminant Name of physician (or Provider) Contacted: Aileen Fass  Current antibiotics: none  Changes to prescribed antibiotics recommended:  Rec no changed, probable contaminant  Results for orders placed or performed during the hospital encounter of 09/07/20  Blood Culture ID Panel (Reflexed) (Collected: 09/07/2020  7:34 PM)  Result Value Ref Range   Enterococcus faecalis NOT DETECTED NOT DETECTED   Enterococcus Faecium NOT DETECTED NOT DETECTED   Listeria monocytogenes NOT DETECTED NOT DETECTED   Staphylococcus species NOT DETECTED NOT DETECTED   Staphylococcus aureus (BCID) NOT DETECTED NOT DETECTED   Staphylococcus epidermidis NOT DETECTED NOT DETECTED   Staphylococcus lugdunensis NOT DETECTED NOT DETECTED   Streptococcus species NOT DETECTED NOT DETECTED   Streptococcus agalactiae NOT DETECTED NOT DETECTED   Streptococcus pneumoniae NOT DETECTED NOT DETECTED   Streptococcus pyogenes NOT DETECTED NOT DETECTED   A.calcoaceticus-baumannii NOT DETECTED NOT DETECTED   Bacteroides fragilis NOT DETECTED NOT DETECTED   Enterobacterales NOT DETECTED NOT DETECTED   Enterobacter cloacae complex NOT DETECTED NOT DETECTED   Escherichia coli NOT DETECTED NOT DETECTED   Klebsiella aerogenes NOT DETECTED NOT DETECTED   Klebsiella oxytoca NOT DETECTED NOT DETECTED   Klebsiella pneumoniae NOT DETECTED NOT DETECTED   Proteus species NOT DETECTED NOT DETECTED   Salmonella species NOT DETECTED NOT DETECTED   Serratia marcescens NOT DETECTED NOT DETECTED   Haemophilus influenzae NOT DETECTED NOT DETECTED   Neisseria meningitidis NOT DETECTED NOT DETECTED   Pseudomonas aeruginosa NOT DETECTED NOT DETECTED    Stenotrophomonas maltophilia NOT DETECTED NOT DETECTED   Candida albicans NOT DETECTED NOT DETECTED   Candida auris NOT DETECTED NOT DETECTED   Candida glabrata NOT DETECTED NOT DETECTED   Candida krusei NOT DETECTED NOT DETECTED   Candida parapsilosis NOT DETECTED NOT DETECTED   Candida tropicalis NOT DETECTED NOT DETECTED   Cryptococcus neoformans/gattii NOT DETECTED NOT DETECTED    Leodis Sias T 09/10/2020  9:23 PM

## 2020-09-10 NOTE — Progress Notes (Signed)
Pts wife has spent the night in the room with patient Saturday & Sunday nights in efforts to assist pt with his needs and decrease his anxiety

## 2020-09-10 NOTE — Telephone Encounter (Signed)
Patient had repeat labs today, he is currently admitted.

## 2020-09-11 ENCOUNTER — Other Ambulatory Visit: Payer: Self-pay

## 2020-09-11 DIAGNOSIS — G9340 Encephalopathy, unspecified: Secondary | ICD-10-CM

## 2020-09-11 LAB — CBC
HCT: 34.1 % — ABNORMAL LOW (ref 39.0–52.0)
Hemoglobin: 11.6 g/dL — ABNORMAL LOW (ref 13.0–17.0)
MCH: 35.6 pg — ABNORMAL HIGH (ref 26.0–34.0)
MCHC: 34 g/dL (ref 30.0–36.0)
MCV: 104.6 fL — ABNORMAL HIGH (ref 80.0–100.0)
Platelets: 109 10*3/uL — ABNORMAL LOW (ref 150–400)
RBC: 3.26 MIL/uL — ABNORMAL LOW (ref 4.22–5.81)
RDW: 16.2 % — ABNORMAL HIGH (ref 11.5–15.5)
WBC: 5.1 10*3/uL (ref 4.0–10.5)
nRBC: 0 % (ref 0.0–0.2)

## 2020-09-11 LAB — COMPREHENSIVE METABOLIC PANEL
ALT: 42 U/L (ref 0–44)
AST: 114 U/L — ABNORMAL HIGH (ref 15–41)
Albumin: 2.5 g/dL — ABNORMAL LOW (ref 3.5–5.0)
Alkaline Phosphatase: 134 U/L — ABNORMAL HIGH (ref 38–126)
Anion gap: 9 (ref 5–15)
BUN: 8 mg/dL (ref 6–20)
CO2: 27 mmol/L (ref 22–32)
Calcium: 8.3 mg/dL — ABNORMAL LOW (ref 8.9–10.3)
Chloride: 97 mmol/L — ABNORMAL LOW (ref 98–111)
Creatinine, Ser: 0.46 mg/dL — ABNORMAL LOW (ref 0.61–1.24)
GFR, Estimated: >60 mL/min (ref >60)
Glucose, Bld: 109 mg/dL — ABNORMAL HIGH (ref 70–99)
Potassium: 3.5 mmol/L (ref 3.5–5.1)
Sodium: 133 mmol/L — ABNORMAL LOW (ref 135–145)
Total Bilirubin: 6 mg/dL — ABNORMAL HIGH (ref 0.3–1.2)
Total Protein: 6.3 g/dL — ABNORMAL LOW (ref 6.5–8.1)

## 2020-09-11 LAB — PROTIME-INR
INR: 1.6 — ABNORMAL HIGH (ref 0.8–1.2)
Prothrombin Time: 18.6 seconds — ABNORMAL HIGH (ref 11.4–15.2)

## 2020-09-11 LAB — MAGNESIUM: Magnesium: 1.9 mg/dL (ref 1.7–2.4)

## 2020-09-11 LAB — AFP TUMOR MARKER: AFP, Serum, Tumor Marker: 3.6 ng/mL (ref 0.0–6.9)

## 2020-09-11 LAB — PHOSPHORUS: Phosphorus: 3.5 mg/dL (ref 2.5–4.6)

## 2020-09-11 MED ORDER — SPIRONOLACTONE 100 MG PO TABS
100.0000 mg | ORAL_TABLET | Freq: Every day | ORAL | 3 refills | Status: DC
  Filled 2020-09-11: qty 30, 30d supply, fill #0

## 2020-09-11 MED ORDER — THIAMINE HCL 100 MG PO TABS
100.0000 mg | ORAL_TABLET | Freq: Every day | ORAL | 0 refills | Status: DC
  Filled 2020-09-11: qty 30, 30d supply, fill #0

## 2020-09-11 MED ORDER — THIAMINE HCL 100 MG PO TABS: 100.0000 mg | ORAL_TABLET | Freq: Every day | ORAL | 0 refills | Status: AC

## 2020-09-11 MED ORDER — LACTULOSE ENCEPHALOPATHY 10 GM/15ML PO SOLN
10.0000 g | Freq: Two times a day (BID) | ORAL | 0 refills | Status: AC
  Filled 2020-09-11: qty 10800, 360d supply, fill #0
  Filled 2020-10-25: qty 2700, 90d supply, fill #0

## 2020-09-11 MED ORDER — FOLIC ACID 1 MG PO TABS
1.0000 mg | ORAL_TABLET | Freq: Every day | ORAL | 0 refills | Status: DC
  Filled 2020-09-11: qty 30, 30d supply, fill #0

## 2020-09-11 MED ORDER — FUROSEMIDE 40 MG PO TABS
40.0000 mg | ORAL_TABLET | Freq: Every day | ORAL | 2 refills | Status: DC
  Filled 2020-09-11: qty 30, 30d supply, fill #0

## 2020-09-11 MED ORDER — ADULT MULTIVITAMIN W/MINERALS CH
1.0000 | ORAL_TABLET | Freq: Every day | ORAL | 0 refills | Status: DC
  Filled 2020-09-11: qty 30, 30d supply, fill #0

## 2020-09-11 MED ORDER — SPIRONOLACTONE 100 MG PO TABS: 100.0000 mg | ORAL_TABLET | Freq: Every day | ORAL | 3 refills | Status: DC

## 2020-09-11 MED ORDER — ADULT MULTIVITAMIN W/MINERALS CH: 1.0000 | ORAL_TABLET | Freq: Every day | ORAL | 0 refills | Status: AC

## 2020-09-11 MED ORDER — FUROSEMIDE 40 MG PO TABS: 40.0000 mg | ORAL_TABLET | Freq: Every day | ORAL | 2 refills | Status: DC

## 2020-09-11 MED ORDER — FOLIC ACID 1 MG PO TABS
1.0000 mg | ORAL_TABLET | Freq: Every day | ORAL | 0 refills | Status: DC
  Filled 2020-09-12: qty 30, 30d supply, fill #0

## 2020-09-11 NOTE — Discharge Summary (Signed)
Physician Discharge Summary  Shawn Martin ZRA:[id number] DOB: [birth year] DOA: 09/07/2020  PCP: [name], MD  Admit date: 09/07/2020 Discharge date: 09/11/2020  Admitted From: Home  Disposition:  Home   Recommendations for Outpatient Follow-up:  Follow up with PCP in 1-2 weeks Please obtain BMP/CBC in one week Encourage alcohol cessation.   Home Health: None  Discharge Condition: Stable.  CODE STATUS:Full Code Diet recommendation: Heart Healthy   Brief/Interim Summary done by Dr [name]: This 45 y.o. male with PMH significant for alcoholic cirrhosis status post TIPS procedure in April 2021, allergic rhinitis, chronic hypokalemia, who is admitted with acute encephalopathy after presenting from home for the evaluation of altered mental status.  Wife reports that the patient completely stopped all alcohol consumption immediately following TIPS procedure and as far as she is aware, the patient has remained abstinent to alcohol in the interval. Patient has been noncompliant with lactulose and diuretic regimen. GI was consulted, Patient was given lactulose.  Acute encephalopathy has improved.      Acute hepatic  encephalopathy: Suspect this is multifactorial in nature. It appears like patient has resumed drinking alcohol. Serum ETOH level found to be 500.   Hepatic encaph due to noncompliance with lactulose. GI consulted recommended follow-up liver Doppler. Continue diuretics , continue lactulose, low-sodium diet. Patient is alert and oriented back to his baseline mental status. Reduce lactulose 10 mg every 12 since he has developed diarrhea. Resolved.   Alcoholic cirrhosis: Patient underwent TIPS procedure in April 2021. Continued to drink alcohol.  Noncompliant with his medication regimen. Resume Lasix and spironolactone regimen. Increase dose during this admission.   Alcohol intoxication: Continue thiamine and folic acid. Continue CIWA protocol. CIWA score 3.  Stable for discharge.    Lactic acidosis: No source of infection found.  This could be multifactorial. This could be due to alcohol intoxication.   Hypokalemia/ hypomagnesemia: Replacement in progress, continue to monitor.    Discharge Diagnoses:  Principal Problem:   Acute encephalopathy Active Problems:   Cirrhosis ([place])   Acute alcohol intoxication ([place])   Alcohol abuse with intoxication ([place])   Lactic acidosis   Hypokalemia   Allergic rhinitis    Discharge Instructions  Discharge Instructions     Diet - low sodium heart healthy   Complete by: As directed    Increase activity slowly   Complete by: As directed       Allergies as of 09/11/2020       Reactions   Gluten Meal Diarrhea   bloating        Medication List     STOP taking these medications    oxyCODONE 5 MG immediate release tablet Commonly known as: Roxicodone   potassium chloride SA 20 MEQ tablet Commonly known as: KLOR-CON       TAKE these medications    cetirizine 10 MG tablet Commonly known as: ZYRTEC Take 10 mg by mouth daily.   ferrous sulfate 325 (65 FE) MG tablet Take 325 mg by mouth daily.   folic acid 1 MG tablet Commonly known as: FOLVITE Take 1 tablet (1 mg total) by mouth daily. Start taking on: September 12, 2020   furosemide 40 MG tablet Commonly known as: LASIX Take 1 tablet (40 mg total) by mouth daily. Start taking on: September 12, 2020 What changed:  medication strength how much to take   lactulose (encephalopathy) 10 GM/15ML Soln Commonly known as: CHRONULAC Take 15 mLs (10 g total) by mouth 2 (two) times daily.  multivitamin with minerals Tabs tablet Take 1 tablet by mouth daily. Start taking on: September 12, 2020   spironolactone 100 MG tablet Commonly known as: ALDACTONE Take 1 tablet (100 mg total) by mouth daily. Start taking on: September 12, 2020 What changed:  medication strength how much to take   thiamine 100 MG tablet Take 1 tablet (100 mg total)  by mouth daily. Start taking on: September 12, 2020   Vitamin D3 50 MCG (2000 UT) Tabs Take 2,000 Units by mouth daily.   zinc gluconate 50 MG tablet Take 50 mg by mouth daily.        Follow-up Information     Armbruster, [name], MD Follow up in 1 day(s).   Specialty: Gastroenterology Why: keep your appointment : [name] on Wed 09/12/2020 at 1:30pm Contact information: [address] [place] [address] [phone]                Allergies  Allergen Reactions   Gluten Meal Diarrhea    bloating    Consultations: GI   Procedures/Studies: DG Chest 2 View  Result Date: 09/07/2020 CLINICAL DATA:  Confusion EXAM: CHEST - 2 VIEW COMPARISON:  07/30/2019 FINDINGS: Cardiac shadow is mildly prominent but accentuated by the portable technique. The overall inspiratory effort is poor. Minimal blunting of the left costophrenic angle is noted consistent with small effusion. No focal infiltrate is noted. No bony abnormality is seen. IMPRESSION: Minimal left pleural effusion.  No other focal abnormality is noted. Electronically Signed   By: Inez Catalina M.D.   On: 09/07/2020 20:30   CT HEAD WO CONTRAST  Result Date: 09/07/2020 CLINICAL DATA:  Altered mental status EXAM: CT HEAD WITHOUT CONTRAST TECHNIQUE: Contiguous axial images were obtained from the base of the skull through the vertex without intravenous contrast. COMPARISON:  None. FINDINGS: Brain: Image quality is degraded by motion. Mild cerebral atrophy. No acute intracranial abnormality. Specifically, no hemorrhage, hydrocephalus, mass lesion, acute infarction, or significant intracranial injury. Vascular: No hyperdense vessel or unexpected calcification. Skull: No acute calvarial abnormality. Sinuses/Orbits: No acute findings Other: None IMPRESSION: Mild image quality degradation due to motion. No visible acute intracranial abnormality. Electronically Signed   By: Rolm Baptise M.D.   On: 09/07/2020 20:38   US  LIVER DOPPLER  Result Date: 09/08/2020 CLINICAL DATA:  45 year old male with a history of prior tips 07/26/2019 and abdominal swelling EXAM: DUPLEX ULTRASOUND OF LIVER TECHNIQUE: Color and duplex Doppler ultrasound was performed to evaluate the hepatic in-flow and out-flow vessels. COMPARISON:  02/14/2020 FINDINGS: Portal Vein Velocities Main:  43 cm/sec Right:  45 cm/sec Left:  32 cm/sec Tips Proximal: 85 centimeter/second Mid: 104 centimeter/second Distal: 186 centimeter/second Hepatic Vein Velocities Right:  117 cm/sec Middle:  17 cm/sec Left:  26 cm/sec Hepatic Artery Velocity:  118 cm/sec Splenic Vein Velocity:  28 cm/sec Varices: None visualized Ascites: Ascites present Cirrhotic changes of the liver IMPRESSION: Directed duplex demonstrates patent TIPS with low resistance waveform. Ascites present. Signed, Dulcy Fanny. Dellia Nims, RPVI [facility] Radiology Electronically Signed   By: Corrie Mckusick D.O.   On: 09/08/2020 16:14   VAS [place] LOWER EXTREMITY VENOUS (DVT)  Result Date: 09/10/2020  Lower Venous DVT Study Patient Name:  Lamberto FAIN Martin  Date of Exam:   09/09/2020 Medical Rec #: [id number]             Accession #:    [id number] Date of Birth: [birth year]  Patient Gender: M Patient Age:   58Y Exam Location:  [facility] Procedure:      VAS [place] LOWER EXTREMITY VENOUS (DVT) Referring Phys: [id number] PARDEEP KUMAR --------------------------------------------------------------------------------  Indications: Swelling in patient with portal HTN, TIPS.  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 Comparison Study: 07-01-2019 Prior bilateral lower extremity venous study was                   negative for DVT. Performing Technologist: Darlin Coco RDMS,RVT  Examination Guidelines: A complete evaluation includes B-mode imaging, spectral Doppler, color Doppler, and power Doppler as needed of all accessible portions of each vessel. Bilateral testing is  considered an integral part of a complete examination. Limited examinations for reoccurring indications may be performed as noted. The reflux portion of the exam is performed with the patient in reverse Trendelenburg.  +---------+---------------+---------+-----------+----------+--------------+ RIGHT    CompressibilityPhasicitySpontaneityPropertiesThrombus Aging +---------+---------------+---------+-----------+----------+--------------+ CFV      Full           Yes      Yes                                 +---------+---------------+---------+-----------+----------+--------------+ SFJ      Full                                                        +---------+---------------+---------+-----------+----------+--------------+ FV Prox  Full                                                        +---------+---------------+---------+-----------+----------+--------------+ FV Mid   Full                                                        +---------+---------------+---------+-----------+----------+--------------+ FV DistalFull                                                        +---------+---------------+---------+-----------+----------+--------------+ PFV      Full                                                        +---------+---------------+---------+-----------+----------+--------------+ POP      Full           Yes      Yes                                 +---------+---------------+---------+-----------+----------+--------------+ PTV      Full                                                        +---------+---------------+---------+-----------+----------+--------------+  PERO     Full                                                        +---------+---------------+---------+-----------+----------+--------------+ Gastroc  Full                                                         +---------+---------------+---------+-----------+----------+--------------+   +---------+---------------+---------+-----------+----------+--------------+ LEFT     CompressibilityPhasicitySpontaneityPropertiesThrombus Aging +---------+---------------+---------+-----------+----------+--------------+ CFV      Full           Yes      Yes                                 +---------+---------------+---------+-----------+----------+--------------+ SFJ      Full                                                        +---------+---------------+---------+-----------+----------+--------------+ FV Prox  Full                                                        +---------+---------------+---------+-----------+----------+--------------+ FV Mid   Full                                                        +---------+---------------+---------+-----------+----------+--------------+ FV DistalFull                                                        +---------+---------------+---------+-----------+----------+--------------+ PFV      Full                                                        +---------+---------------+---------+-----------+----------+--------------+ POP      Full           Yes      Yes                                 +---------+---------------+---------+-----------+----------+--------------+ PTV      Full                                                        +---------+---------------+---------+-----------+----------+--------------+  PERO     Full                                                        +---------+---------------+---------+-----------+----------+--------------+ Gastroc  Full                                                        +---------+---------------+---------+-----------+----------+--------------+     Summary: RIGHT: - There is no evidence of deep vein thrombosis in the lower extremity.  - No cystic structure found in  the popliteal fossa.  LEFT: - There is no evidence of deep vein thrombosis in the lower extremity.  - No cystic structure found in the popliteal fossa.  *See table(s) above for measurements and observations. Electronically signed by Jamelle Haring on 09/10/2020 at 10:51:11 AM.    Final    US Abdomen Limited RUQ (LIVER/GB)  Result Date: 09/09/2020 CLINICAL DATA:  Cirrhosis.  Follow-up. EXAM: ULTRASOUND ABDOMEN LIMITED RIGHT UPPER QUADRANT COMPARISON:  September 08, 2020 FINDINGS: Gallbladder: No gallstones or wall thickening visualized. No sonographic Murphy sign noted by sonographer. Common bile duct: Diameter: 2 mm Liver: Nodular contour consistent with cirrhosis. Heterogeneous echotexture. Portal vein is patent on color Doppler imaging with normal direction of blood flow towards the liver. Other: Ascites identified in the right upper quadrant. IMPRESSION: Cirrhotic liver. Ascites in the right upper quadrant. No other abnormalities. Electronically Signed   By: Dorise Bullion III M.D   On: 09/09/2020 15:54     Subjective: He is alert and oriented. Last alcohol drink was Friday. Wife report he is doing better since Sunday in regards to tremors. He denies lightheaded.   Discharge Exam: Vitals:   09/11/20 0736 09/11/20 0900  BP: 137/86 127/86  Pulse: 99 (!) 101  Resp: 18   Temp: 98 F (36.7 C)   SpO2: 99%      General: Pt is alert, awake, not in acute distress Cardiovascular: RRR, S1/S2 +, no rubs, no gallops Respiratory: CTA bilaterally, no wheezing, no rhonchi Abdominal: Soft, NT, ND, bowel sounds + Extremities: no edema, no cyanosis    The results of significant diagnostics from this hospitalization (including imaging, microbiology, ancillary and laboratory) are listed below for reference.     Microbiology: Recent Results (from the past 240 hour(s))  Blood Cultures (routine x 2)     Status: None (Preliminary result)   Collection Time: 09/07/20  7:34 PM   Specimen: BLOOD  Result Value  Ref Range Status   Specimen Description   Final    BLOOD SITE NOT SPECIFIED Performed at [facility], [address], [place], [place] [address]    Special Requests   Final    BOTTLES DRAWN AEROBIC ONLY Blood Culture adequate volume Performed at [place] [address] [place], [place] [address]    Culture  Setup Time   Final    GRAM POSITIVE COCCI IN CLUSTERS AEROBIC BOTTLE ONLY CRITICAL RESULT CALLED TO, READ BACK BY AND VERIFIED WITH: Colin Rhein [facility] 9242 09/10/20 A BROWNING    Culture   Final    CULTURE REINCUBATED FOR BETTER GROWTH Performed at [facility], [address] [place], [place] [address]    Report Status PENDING  Incomplete  Blood Culture ID Panel (Reflexed)     Status: None   Collection Time: 09/07/20  7:34 PM  Result Value Ref Range Status   Enterococcus faecalis NOT DETECTED NOT DETECTED Final   Enterococcus Faecium NOT DETECTED NOT DETECTED Final   Listeria monocytogenes NOT DETECTED NOT DETECTED Final   Staphylococcus species NOT DETECTED NOT DETECTED Final   Staphylococcus aureus (BCID) NOT DETECTED NOT DETECTED Final   Staphylococcus epidermidis NOT DETECTED NOT DETECTED Final   Staphylococcus lugdunensis NOT DETECTED NOT DETECTED Final   Streptococcus species NOT DETECTED NOT DETECTED Final   Streptococcus agalactiae NOT DETECTED NOT DETECTED Final   Streptococcus pneumoniae NOT DETECTED NOT DETECTED Final   Streptococcus pyogenes NOT DETECTED NOT DETECTED Final   A.calcoaceticus-baumannii NOT DETECTED NOT DETECTED Final   Bacteroides fragilis NOT DETECTED NOT DETECTED Final   Enterobacterales NOT DETECTED NOT DETECTED Final   Enterobacter cloacae complex NOT DETECTED NOT DETECTED Final   Escherichia coli NOT DETECTED NOT DETECTED Final   Klebsiella aerogenes NOT DETECTED NOT DETECTED Final   Klebsiella oxytoca NOT DETECTED NOT DETECTED Final   Klebsiella pneumoniae NOT DETECTED NOT DETECTED Final   Proteus species  NOT DETECTED NOT DETECTED Final   Salmonella species NOT DETECTED NOT DETECTED Final   Serratia marcescens NOT DETECTED NOT DETECTED Final   Haemophilus influenzae NOT DETECTED NOT DETECTED Final   Neisseria meningitidis NOT DETECTED NOT DETECTED Final   Pseudomonas aeruginosa NOT DETECTED NOT DETECTED Final   Stenotrophomonas maltophilia NOT DETECTED NOT DETECTED Final   Candida albicans NOT DETECTED NOT DETECTED Final   Candida auris NOT DETECTED NOT DETECTED Final   Candida glabrata NOT DETECTED NOT DETECTED Final   Candida krusei NOT DETECTED NOT DETECTED Final   Candida parapsilosis NOT DETECTED NOT DETECTED Final   Candida tropicalis NOT DETECTED NOT DETECTED Final   Cryptococcus neoformans/gattii NOT DETECTED NOT DETECTED Final    Comment: Performed at [facility], [address], [place], [place] [address]  Resp Panel by RT-PCR (Flu A&B, Covid) Nasopharyngeal Swab     Status: None   Collection Time: 09/07/20 10:51 PM   Specimen: Nasopharyngeal Swab; Nasopharyngeal(NP) swabs in vial transport medium  Result Value Ref Range Status   SARS Coronavirus 2 by RT PCR NEGATIVE NEGATIVE Final    Comment: (NOTE) SARS-CoV-2 target nucleic acids are NOT DETECTED.  The SARS-CoV-2 RNA is generally detectable in upper respiratory specimens during the acute phase of infection. The lowest concentration of SARS-CoV-2 viral copies this assay can detect is 138 copies/mL. A negative result does not preclude SARS-Cov-2 infection and should not be used as the sole basis for treatment or other patient management decisions. A negative result may occur with  improper specimen collection/handling, submission of specimen other than nasopharyngeal swab, presence of viral mutation(s) within the areas targeted by this assay, and inadequate number of viral copies(<138 copies/mL). A negative result must be combined with clinical observations, patient history, and epidemiological information. The  expected result is Negative.  Fact Sheet for Patients:  EntrepreneurPulse.com.au  Fact Sheet for Healthcare Providers:  IncredibleEmployment.be  This test is no t yet approved or cleared by the [place] FDA and  has been authorized for detection and/or diagnosis of SARS-CoV-2 by FDA under an Emergency Use Authorization (EUA). This EUA will remain  in effect (meaning this test can be used) for the duration of the COVID-19 declaration under Section 564(b)(1)  of the Act, 21 U.S.C.section 360bbb-3(b)(1), unless the authorization is terminated  or revoked sooner.       Influenza A by PCR NEGATIVE NEGATIVE Final   Influenza B by PCR NEGATIVE NEGATIVE Final    Comment: (NOTE) The Xpert Xpress SARS-CoV-2/FLU/RSV plus assay is intended as an aid in the diagnosis of influenza from Nasopharyngeal swab specimens and should not be used as a sole basis for treatment. Nasal washings and aspirates are unacceptable for Xpert Xpress SARS-CoV-2/FLU/RSV testing.  Fact Sheet for Patients: EntrepreneurPulse.com.au  Fact Sheet for Healthcare Providers: IncredibleEmployment.be  This test is not yet approved or cleared by the [place] FDA and has been authorized for detection and/or diagnosis of SARS-CoV-2 by FDA under an Emergency Use Authorization (EUA). This EUA will remain in effect (meaning this test can be used) for the duration of the COVID-19 declaration under Section 564(b)(1) of the Act, 21 U.S.C. section 360bbb-3(b)(1), unless the authorization is terminated or revoked.  Performed at [facility], [place] [address], [employer], [place] [address]      Labs: BNP (last 3 results) No results for input(s): BNP in the last 8760 hours. Basic Metabolic Panel: Recent Labs  Lab 09/08/20 0536 09/09/20 0521 09/10/20 0521 09/10/20 2043 09/11/20 0516  NA 142 133* 132* 129* 133*  K 3.0* 2.9* 3.0*  3.4* 3.5  CL 100 93* 96* 95* 97*  CO2 32 30 29 28 27   GLUCOSE 136* 123* 114* 127* 109*  BUN <5* <5* 8 7 8   CREATININE 0.44* 0.51* 0.55* 0.62 0.46*  CALCIUM 7.4* 7.7* 7.7* 8.1* 8.3*  MG 1.7 1.5* 1.6* 1.8 1.9  PHOS 3.4 2.4* 2.8 2.5 3.5   Liver Function Tests: Recent Labs  Lab 09/08/20 0536 09/09/20 0521 09/10/20 0521 09/10/20 2043 09/11/20 0516  AST 159* 151* 120* 115* 114*  ALT 51* 51* 42 43 42  ALKPHOS 139* 146* 143* 149* 134*  BILITOT 4.1* 5.9* 6.6* 5.2* 6.0*  PROT 6.0* 6.6 6.1* 6.3* 6.3*  ALBUMIN 2.5* 2.6* 2.3* 2.5* 2.5*   No results for input(s): LIPASE, AMYLASE in the last 168 hours. No results for input(s): AMMONIA in the last 168 hours. CBC: Recent Labs  Lab 09/07/20 1001 09/07/20 1934 09/08/20 0536 09/09/20 0521 09/10/20 0521 09/10/20 2043 09/11/20 0516  WBC 3.3* 7.0 3.0* 4.1 4.8 5.6 5.1  NEUTROABS 1.5 1.8 1.0*  --   --   --   --   HGB 13.3 13.4 11.4* 12.0* 11.8* 12.2* 11.6*  HCT 37.5* 38.7* 32.9* 34.5* 34.1* 35.7* 34.1*  MCV 103.5* 102.1* 101.9* 101.2* 102.4* 103.5* 104.6*  PLT 131.0* 136* 117* 106* 96* 105* 109*   Cardiac Enzymes: No results for input(s): CKTOTAL, CKMB, CKMBINDEX, TROPONINI in the last 168 hours. BNP: Invalid input(s): POCBNP CBG: No results for input(s): GLUCAP in the last 168 hours. D-Dimer No results for input(s): DDIMER in the last 72 hours. Hgb A1c No results for input(s): HGBA1C in the last 72 hours. Lipid Profile No results for input(s): CHOL, HDL, LDLCALC, TRIG, CHOLHDL, LDLDIRECT in the last 72 hours. Thyroid function studies No results for input(s): TSH, T4TOTAL, T3FREE, THYROIDAB in the last 72 hours.  Invalid input(s): FREET3 Anemia work up No results for input(s): VITAMINB12, FOLATE, FERRITIN, TIBC, IRON, RETICCTPCT in the last 72 hours. Urinalysis    Component Value Date/Time   COLORURINE YELLOW 07/01/2019 1224   APPEARANCEUR CLEAR 07/01/2019 1224   LABSPEC 1.019 07/01/2019 1224   PHURINE 6.0 07/01/2019 1224    GLUCOSEU NEGATIVE 07/01/2019 1224  HGBUR NEGATIVE 07/01/2019 1224   BILIRUBINUR NEGATIVE 07/01/2019 1224   KETONESUR 5 (A) 07/01/2019 1224   PROTEINUR NEGATIVE 07/01/2019 1224   NITRITE NEGATIVE 07/01/2019 1224   LEUKOCYTESUR NEGATIVE 07/01/2019 1224   Sepsis Labs Invalid input(s): PROCALCITONIN,  WBC,  LACTICIDVEN Microbiology Recent Results (from the past 240 hour(s))  Blood Cultures (routine x 2)     Status: None (Preliminary result)   Collection Time: 09/07/20  7:34 PM   Specimen: BLOOD  Result Value Ref Range Status   Specimen Description   Final    BLOOD SITE NOT SPECIFIED Performed at [facility], [place] [address], [place], [place] [address]    Special Requests   Final    BOTTLES DRAWN AEROBIC ONLY Blood Culture adequate volume Performed at [place] [address], [place], [place] [address]    Culture  Setup Time   Final    GRAM POSITIVE COCCI IN CLUSTERS AEROBIC BOTTLE ONLY CRITICAL RESULT CALLED TO, READ BACK BY AND VERIFIED WITH: Colin Rhein [facility] 2121 09/10/20 A BROWNING    Culture   Final    CULTURE REINCUBATED FOR BETTER GROWTH Performed at [facility], [place] [address], [place], [place] [address]    Report Status PENDING  Incomplete  Blood Culture ID Panel (Reflexed)     Status: None   Collection Time: 09/07/20  7:34 PM  Result Value Ref Range Status   Enterococcus faecalis NOT DETECTED NOT DETECTED Final   Enterococcus Faecium NOT DETECTED NOT DETECTED Final   Listeria monocytogenes NOT DETECTED NOT DETECTED Final   Staphylococcus species NOT DETECTED NOT DETECTED Final   Staphylococcus aureus (BCID) NOT DETECTED NOT DETECTED Final   Staphylococcus epidermidis NOT DETECTED NOT DETECTED Final   Staphylococcus lugdunensis NOT DETECTED NOT DETECTED Final   Streptococcus species NOT DETECTED NOT DETECTED Final   Streptococcus agalactiae NOT DETECTED NOT DETECTED Final   Streptococcus pneumoniae NOT DETECTED NOT DETECTED Final    Streptococcus pyogenes NOT DETECTED NOT DETECTED Final   A.calcoaceticus-baumannii NOT DETECTED NOT DETECTED Final   Bacteroides fragilis NOT DETECTED NOT DETECTED Final   Enterobacterales NOT DETECTED NOT DETECTED Final   Enterobacter cloacae complex NOT DETECTED NOT DETECTED Final   Escherichia coli NOT DETECTED NOT DETECTED Final   Klebsiella aerogenes NOT DETECTED NOT DETECTED Final   Klebsiella oxytoca NOT DETECTED NOT DETECTED Final   Klebsiella pneumoniae NOT DETECTED NOT DETECTED Final   Proteus species NOT DETECTED NOT DETECTED Final   Salmonella species NOT DETECTED NOT DETECTED Final   Serratia marcescens NOT DETECTED NOT DETECTED Final   Haemophilus influenzae NOT DETECTED NOT DETECTED Final   Neisseria meningitidis NOT DETECTED NOT DETECTED Final   Pseudomonas aeruginosa NOT DETECTED NOT DETECTED Final   Stenotrophomonas maltophilia NOT DETECTED NOT DETECTED Final   Candida albicans NOT DETECTED NOT DETECTED Final   Candida auris NOT DETECTED NOT DETECTED Final   Candida glabrata NOT DETECTED NOT DETECTED Final   Candida krusei NOT DETECTED NOT DETECTED Final   Candida parapsilosis NOT DETECTED NOT DETECTED Final   Candida tropicalis NOT DETECTED NOT DETECTED Final   Cryptococcus neoformans/gattii NOT DETECTED NOT DETECTED Final    Comment: Performed at [facility], [address], [place], [place] [address]  Resp Panel by RT-PCR (Flu A&B, Covid) Nasopharyngeal Swab     Status: None   Collection Time: 09/07/20 10:51 PM   Specimen: Nasopharyngeal Swab; Nasopharyngeal(NP) swabs in vial transport medium  Result Value Ref Range Status  SARS Coronavirus 2 by RT PCR NEGATIVE NEGATIVE Final    Comment: (NOTE) SARS-CoV-2 target nucleic acids are NOT DETECTED.  The SARS-CoV-2 RNA is generally detectable in upper respiratory specimens during the acute phase of infection. The lowest concentration of SARS-CoV-2 viral copies this assay can detect is 138 copies/mL. A  negative result does not preclude SARS-Cov-2 infection and should not be used as the sole basis for treatment or other patient management decisions. A negative result may occur with  improper specimen collection/handling, submission of specimen other than nasopharyngeal swab, presence of viral mutation(s) within the areas targeted by this assay, and inadequate number of viral copies(<138 copies/mL). A negative result must be combined with clinical observations, patient history, and epidemiological information. The expected result is Negative.  Fact Sheet for Patients:  EntrepreneurPulse.com.au  Fact Sheet for Healthcare Providers:  IncredibleEmployment.be  This test is no t yet approved or cleared by the [place] FDA and  has been authorized for detection and/or diagnosis of SARS-CoV-2 by FDA under an Emergency Use Authorization (EUA). This EUA will remain  in effect (meaning this test can be used) for the duration of the COVID-19 declaration under Section 564(b)(1) of the Act, 21 U.S.C.section 360bbb-3(b)(1), unless the authorization is terminated  or revoked sooner.       Influenza A by PCR NEGATIVE NEGATIVE Final   Influenza B by PCR NEGATIVE NEGATIVE Final    Comment: (NOTE) The Xpert Xpress SARS-CoV-2/FLU/RSV plus assay is intended as an aid in the diagnosis of influenza from Nasopharyngeal swab specimens and should not be used as a sole basis for treatment. Nasal washings and aspirates are unacceptable for Xpert Xpress SARS-CoV-2/FLU/RSV testing.  Fact Sheet for Patients: EntrepreneurPulse.com.au  Fact Sheet for Healthcare Providers: IncredibleEmployment.be  This test is not yet approved or cleared by the [place] FDA and has been authorized for detection and/or diagnosis of SARS-CoV-2 by FDA under an Emergency Use Authorization (EUA). This EUA will remain in effect (meaning this test can  be used) for the duration of the COVID-19 declaration under Section 564(b)(1) of the Act, 21 U.S.C. section 360bbb-3(b)(1), unless the authorization is terminated or revoked.  Performed at [facility], [place] [address] [place], [place] [address]      Time coordinating discharge: 40 minutes  SIGNED:   Elmarie Shiley, MD  [facility]

## 2020-09-11 NOTE — Plan of Care (Signed)
  Problem: Coping: Goal: Level of anxiety will decrease Outcome: Progressing   Problem: Pain Managment: Goal: General experience of comfort will improve Outcome: Progressing   Problem: Safety: Goal: Ability to remain free from injury will improve Outcome: Progressing   

## 2020-09-11 NOTE — Progress Notes (Signed)
[facility] Progress Note  CC:  Decompensated alcohol-related cirrhosis    Subjective: Shawn Martin is tired this am. Shawn Martin stated his HR was elevated last night and Shawn Martin was awakened every 2 hours to have his vital signs checked. No N/V. No abdominal pain. Shawn Martin reported passing 4 to 5 nonbloody soft golden to brown loose stools over the past 24 hours. No confusion or agitation. No CP or SOB. Wife is at the bed side.   Objective:   HR 115 b/min with BP 138/89 at 2144 on 09/10/2020. HR 101 at 0900 today. Shawn Martin is not anxious.   Last received Ativan around 8pm 09/10/2020.   Vital signs in last 24 hours: Temp:  [98 F (36.7 C)-98.4 F (36.9 C)] 98 F (36.7 C) (06/14 0736) Pulse Rate:  [99-125] 99 (06/14 0736) Resp:  [18-20] 18 (06/14 0736) BP: (127-148)/(86-100) 137/86 (06/14 0736) SpO2:  [98 %-100 %] 99 % (06/14 0736) Weight:  [91.5 kg-92.9 kg] 92.9 kg (06/14 0500) Last BM Date: 09/08/20   General: Alert 45 year old male in NAD.  Eyes: Scleral icterus present.  Heart: RRR, no murmur.  Pulm:  Breath sounds clear throughout.  Abdomen: Soft, nondistended. Nontender. No obvious ascites. No HSM. + BS x 4 quads.  Extremities:  No LE edema. Neurologic:  Alert and  oriented x 4. Upper extremities with mildly tremulous without asterixis.  Psych:  Alert and cooperative. Normal mood and affect.  Intake/Output from previous day: No intake/output data recorded. Intake/Output this shift: No intake/output data recorded.  Lab Results: Recent Labs    09/10/20 0521 09/10/20 2043 09/11/20 0516  WBC 4.8 5.6 5.1  HGB 11.8* 12.2* 11.6*  HCT 34.1* 35.7* 34.1*  PLT 96* 105* 109*   BMET Recent Labs    09/10/20 0521 09/10/20 2043 09/11/20 0516  NA 132* 129* 133*  K 3.0* 3.4* 3.5  CL 96* 95* 97*  CO2 29 28 27   GLUCOSE 114* 127* 109*  BUN 8 7 8   CREATININE 0.55* 0.62 0.46*  CALCIUM 7.7* 8.1* 8.3*   LFT Recent Labs    09/11/20 0516  PROT 6.3*  ALBUMIN 2.5*  AST 114*  ALT 42   ALKPHOS 134*  BILITOT 6.0*   PT/INR Recent Labs    09/10/20 0521 09/11/20 0516  LABPROT 18.8* 18.6*  INR 1.6* 1.6*   Hepatitis Panel No results for input(s): HEPBSAG, HCVAB, HEPAIGM, HEPBIGM in the last 72 hours.  VAS [place] LOWER EXTREMITY VENOUS (DVT)  Result Date: 09/10/2020  Lower Venous DVT Study Patient Name:  Shawn Martin  Date of Exam:   09/09/2020 Medical Rec #: [id number]             Accession #:    [id number] Date of Birth: Feb 18, 1976              Patient Gender: M Patient Age:   45Y Exam Location:  [facility] Procedure:      VAS [place] LOWER EXTREMITY VENOUS (DVT) Referring Phys: [id number] PARDEEP KUMAR --------------------------------------------------------------------------------  Indications: Swelling in patient with portal HTN, TIPS.  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 Comparison Study: 07-01-2019 Prior bilateral lower extremity venous study was                   negative for DVT. Performing Technologist: Darlin Coco RDMS,RVT  Examination Guidelines: A complete evaluation includes B-mode imaging, spectral Doppler, color Doppler, and power Doppler as needed of all accessible portions of each vessel. Bilateral testing is considered an integral  part of a complete examination. Limited examinations for reoccurring indications may be performed as noted. The reflux portion of the exam is performed with the patient in reverse Trendelenburg.  +---------+---------------+---------+-----------+----------+--------------+ RIGHT    CompressibilityPhasicitySpontaneityPropertiesThrombus Aging +---------+---------------+---------+-----------+----------+--------------+ CFV      Full           Yes      Yes                                 +---------+---------------+---------+-----------+----------+--------------+ SFJ      Full                                                        +---------+---------------+---------+-----------+----------+--------------+ FV Prox  Full                                                         +---------+---------------+---------+-----------+----------+--------------+ FV Mid   Full                                                        +---------+---------------+---------+-----------+----------+--------------+ FV DistalFull                                                        +---------+---------------+---------+-----------+----------+--------------+ PFV      Full                                                        +---------+---------------+---------+-----------+----------+--------------+ POP      Full           Yes      Yes                                 +---------+---------------+---------+-----------+----------+--------------+ PTV      Full                                                        +---------+---------------+---------+-----------+----------+--------------+ PERO     Full                                                        +---------+---------------+---------+-----------+----------+--------------+ Gastroc  Full                                                        +---------+---------------+---------+-----------+----------+--------------+   +---------+---------------+---------+-----------+----------+--------------+  LEFT     CompressibilityPhasicitySpontaneityPropertiesThrombus Aging +---------+---------------+---------+-----------+----------+--------------+ CFV      Full           Yes      Yes                                 +---------+---------------+---------+-----------+----------+--------------+ SFJ      Full                                                        +---------+---------------+---------+-----------+----------+--------------+ FV Prox  Full                                                        +---------+---------------+---------+-----------+----------+--------------+ FV Mid   Full                                                         +---------+---------------+---------+-----------+----------+--------------+ FV DistalFull                                                        +---------+---------------+---------+-----------+----------+--------------+ PFV      Full                                                        +---------+---------------+---------+-----------+----------+--------------+ POP      Full           Yes      Yes                                 +---------+---------------+---------+-----------+----------+--------------+ PTV      Full                                                        +---------+---------------+---------+-----------+----------+--------------+ PERO     Full                                                        +---------+---------------+---------+-----------+----------+--------------+ Gastroc  Full                                                        +---------+---------------+---------+-----------+----------+--------------+  Summary: RIGHT: - There is no evidence of deep vein thrombosis in the lower extremity.  - No cystic structure found in the popliteal fossa.  LEFT: - There is no evidence of deep vein thrombosis in the lower extremity.  - No cystic structure found in the popliteal fossa.  *See table(s) above for measurements and observations. Electronically signed by Jamelle Haring on 09/10/2020 at 10:51:11 AM.    Final    US Abdomen Limited RUQ (LIVER/GB)  Result Date: 09/09/2020 CLINICAL DATA:  Cirrhosis.  Follow-up. EXAM: ULTRASOUND ABDOMEN LIMITED RIGHT UPPER QUADRANT COMPARISON:  September 08, 2020 FINDINGS: Gallbladder: No gallstones or wall thickening visualized. No sonographic Murphy sign noted by sonographer. Common bile duct: Diameter: 2 mm Liver: Nodular contour consistent with cirrhosis. Heterogeneous echotexture. Portal vein is patent on color Doppler imaging with normal direction of blood flow towards the liver. Other: Ascites identified  in the right upper quadrant. IMPRESSION: Cirrhotic liver. Ascites in the right upper quadrant. No other abnormalities. Electronically Signed   By: Dorise Bullion III M.D   On: 09/09/2020 15:54    Assessment / Plan:  43. 45 year old male with decompensated EtOH induced cirrhosis/prior TIPS placement/ongoing alcohol abuse/volume overload/hepatic hydrothorax and encephalopathy.  TIPS shunt patent per doppler sono. RUQ sono 6/12 consistent with cirrhosis, ascites in the RUQ  (unlikely enough ascites to tap) and a patent porta vein. MELD 19. MELD-Na 22. MDF < 32. Total bili 6.0. Alk phos 134. AST 114. ALT 42. AFP pending. No overt signs of hepatic encephalopathy at this time.  -Continue Lasix 10m QD and Spironolactone 1012mQD -Continue 2GM low sodium diet -Continue Lactulose 10 GM po bid, titrate to 3 to 4 Bms daily -BMP, Hepatic panel in am -No alcohol ever discussed with the patient. Alcohol rehab strongly recommended. -Cautious use of Lorazepam for anxiety/signs of EtOH withdrawal per CIWA protocol  -No NSAIDs -Continue Folate, Thiamin and MVI -Possible discharged home later today if HR/BP stable, defer to the hospitalist. [facility].  -Patient has outpatient follow up appointment in our GI clinic with AmNicoletta Ban Wed 09/12/2020 at 1:30pm   2. Macrocytic anemia secondary to cirrhosis and likely bone marrow suppression associated with alcohol use. No overt GI bleeding. Hg 12.0 -> 11.8 -> 11.6.  MCV 104.6. -CBC, iron panel, B12 and folate level in am   3. Thrombocytopenia secondary to cirrhosis. PLT 109. No splenomegaly per CTAP 06/2019.   4. Hyponatremia secondary to cirrhosis and diuretic use. Na+ 129 -> 133. Normal renal function.  -BMP in am -Continue Lasix and Spironolactone    5. Coagulopathy secondary to cirrhosis. INR 1.6. -INR in am   6. Celiac disease -Continue gluten free diet     Principal Problem:   Acute encephalopathy Active  Problems:   Cirrhosis (HCC)   Acute alcohol intoxication ([facility]  Alcohol abuse with intoxication ([facility]  Lactic acidosis   Hypokalemia   Allergic rhinitis     LOS: 4 days   CoNoralyn Pick6/14/2022, 11:21 AM

## 2020-09-12 ENCOUNTER — Other Ambulatory Visit: Payer: Self-pay

## 2020-09-12 ENCOUNTER — Encounter: Payer: Self-pay | Admitting: Physician Assistant

## 2020-09-12 ENCOUNTER — Ambulatory Visit (INDEPENDENT_AMBULATORY_CARE_PROVIDER_SITE_OTHER): Payer: BC Managed Care – PPO | Admitting: Physician Assistant

## 2020-09-12 VITALS — BP 120/72 | HR 113 | Ht 72.0 in | Wt 187.0 lb

## 2020-09-12 DIAGNOSIS — G9340 Encephalopathy, unspecified: Secondary | ICD-10-CM | POA: Diagnosis not present

## 2020-09-12 DIAGNOSIS — E8770 Fluid overload, unspecified: Secondary | ICD-10-CM

## 2020-09-12 DIAGNOSIS — B1920 Unspecified viral hepatitis C without hepatic coma: Secondary | ICD-10-CM

## 2020-09-12 DIAGNOSIS — G4700 Insomnia, unspecified: Secondary | ICD-10-CM

## 2020-09-12 DIAGNOSIS — K7469 Other cirrhosis of liver: Secondary | ICD-10-CM

## 2020-09-12 MED ORDER — SPIRONOLACTONE 50 MG PO TABS
50.0000 mg | ORAL_TABLET | Freq: Every morning | ORAL | 6 refills | Status: DC
  Filled 2020-09-12 – 2020-10-08 (×2): qty 30, 30d supply, fill #0

## 2020-09-12 MED ORDER — FUROSEMIDE 20 MG PO TABS
20.0000 mg | ORAL_TABLET | Freq: Every morning | ORAL | 6 refills | Status: DC
  Filled 2020-09-12 – 2020-10-08 (×2): qty 30, 30d supply, fill #0

## 2020-09-12 NOTE — Patient Instructions (Addendum)
If you are age 47 or younger, your body mass index should be between 19-25. Your Body mass index is 25.36 kg/m. If this is out of the aformentioned range listed, please consider follow up with your Primary Care Provider.  __________________________________________________________  The [facility] providers would like to encourage you to use [facility] to communicate with providers for non-urgent requests or questions.  Due to long hold times on the telephone, sending your provider a message by [facility] may be a faster and more efficient way to get a response.  Please allow 48 business hours for a response.  Please remember that this is for non-urgent requests.   Your provider has requested that you go to the basement level for lab work next Wednesday 09/19/2020. Press "B" on the elevator. The lab is located at the first door on the left as you exit the elevator.  Decrease you Furosemide to 20 mg 1 tablet every morning Decrease your Spironolactone to 25 mg 1 tablet every morning  Continue Lactulose 15 ml three times daily Continue Thiamine, Vitamin D, Folic Acid, multivitamin  Follow a low sodium diet.  It's okay to take OTC Sleep 3 1 at bedtime.  Continue you Magnesium 400 mg before bedtime  You have been scheduled to follow up with Dr. Havery Moros on November 14, 2020 at 9:20 am.  Thank you for entrusting me with your care and choosing [facility].  Amy Esterwood, PA-C

## 2020-09-12 NOTE — Progress Notes (Signed)
Subjective:    Patient ID: Shawn Martin, male    DOB: [birth year], 45 y.o.   MRN: [id number]  HPI Shawn Martin is a 44 year old white male, established with Dr. Havery Moros who was last seen in the office about a year ago.  Patient has history of decompensated alcohol induced cirrhosis.  He is status post TIPS on 07/26/2019 secondary to refractory ascites, hepatic hydrothorax with associated hypoxia and esophageal varices.  He comes in today for post hospital follow-up after hospitalization 6/10 through 09/11/2020 after he relapsed with alcohol abuse.  He says he had been doing well over the past year until the past month or so when he started drinking heavily due to stress and some issues with insomnia.  He says he "went insane" and was drinking as much is half a gallon within 30 minutes to an hour over that week prior to hospitalization.  He presented to the hospital with volume overload, altered mental status and intoxication. He has gone through alcohol withdrawal.  Blood alcohol on admission 491 He was seen by GI during his admission, restarted on 2 g sodium diet and diuretics. He underwent ultrasound Doppler which showed the TIPS to be patent. He had developed significant peripheral edema and reaccumulation of ascites. M ELD 19/meld sodium 22 Discriminant function score less than 32  Patient says that he is doing much better at this point, and is kicking himself for "being stupid".  His wife says that they have initiated appointments for counseling. He says all of the peripheral edema has resolved and he does not feel like he has any significant fluid in his abdomen currently.  He was discharged on Aldactone 100 mg daily and Lasix 40 mg daily. He has been taking lactulose twice daily and his wife feels that he has been mentating well.  He tends to have loose stools with fairly low-dose lactulose which she says he was not able to take twice daily when he was working because he works as a Geophysicist/field seismologist and  is unable to run to the bathroom.    Patient says that he knows he is not 100%, he says he has had terrible problems with insomnia and that has worsened with hospitalization and withdrawal.  He says he has not slept over the past 5 days.  They are asking for help with sleep.  Labs on discharge yesterday-WBC 5.1/hemoglobin 11.6/hematocrit 34.1/platelets 109 Sodium 133/creatinine 0.46 T bili 6.0/alk phos 134/ALT 42/AST 114 INR 1.6  Review of Systems Pertinent positive and negative review of systems were noted in the above HPI section.  All other review of systems was otherwise negative.   Outpatient Encounter Medications as of 09/12/2020  Medication Sig   cetirizine (ZYRTEC) 10 MG tablet Take 10 mg by mouth daily.   Cholecalciferol (VITAMIN D3) 50 MCG (2000 UT) TABS Take 2,000 Units by mouth daily.   ferrous sulfate 325 (65 FE) MG tablet Take 325 mg by mouth daily.   folic acid (FOLVITE) 1 MG tablet Take 1 tablet (1 mg total) by mouth daily.   furosemide (LASIX) 20 MG tablet Take 1 tablet (20 mg total) by mouth in the morning.   lactulose, encephalopathy, (CHRONULAC) 10 GM/15ML SOLN Take 15 mLs (10 g total) by mouth 2 (two) times daily.   Multiple Vitamin (MULTIVITAMIN WITH MINERALS) TABS tablet Take 1 tablet by mouth daily.   spironolactone (ALDACTONE) 50 MG tablet Take 1 tablet (50 mg total) by mouth in the morning.   thiamine 100 MG tablet Take 1  tablet (100 mg total) by mouth daily.   zinc gluconate 50 MG tablet Take 50 mg by mouth daily.   [DISCONTINUED] furosemide (LASIX) 40 MG tablet Take 1 tablet (40 mg total) by mouth daily.   [DISCONTINUED] spironolactone (ALDACTONE) 100 MG tablet Take 1 tablet (100 mg total) by mouth daily.   No facility-administered encounter medications on file as of 09/12/2020.   Allergies  Allergen Reactions   Gluten Meal Diarrhea    bloating   Patient Active Problem List   Diagnosis Date Noted   Acute encephalopathy 09/08/2020   Acute alcohol  intoxication ([place]) 09/08/2020   Alcohol abuse with intoxication ([place]) 09/08/2020   Lactic acidosis 09/08/2020   Hypokalemia 09/08/2020   Allergic rhinitis 09/08/2020   Cirrhosis ([place]) 07/26/2019   Acute respiratory failure with hypoxia ([place]) 07/26/2019   Hyperkalemia 06/30/2019   AKI (acute kidney injury) ([place]) 06/30/2019   Hyponatremia 06/03/2019   Alcohol dependence ([place]) 06/03/2019   Symptomatic anemia 06/03/2019   Ascites due to alcoholic cirrhosis ([place]) 16/12/9602   Alcoholic cirrhosis of liver with ascites ([place]) 01/28/2019   Screening for viral disease 01/28/2019   Social History   Socioeconomic History   Marital status: Married    Spouse name: Not on file   Number of children: 2   Years of education: Not on file   Highest education level: Not on file  Occupational History   Not on file  Tobacco Use   Smoking status: Former    Pack years: 0.00   Smokeless tobacco: Former    Types: Chew    Quit date: 03/2017  Vaping Use   Vaping Use: Never used  Substance and Sexual Activity   Alcohol use: Yes    Comment: every day drinker, 6 pack per day   Drug use: Not Currently   Sexual activity: Yes    Partners: Female  Other Topics Concern   Not on file  Social History Narrative   Not on file   Social Determinants of Health   Financial Resource Strain: Not on file  Food Insecurity: Not on file  Transportation Needs: Not on file  Physical Activity: Not on file  Stress: Not on file  Social Connections: Not on file  Intimate Partner Violence: Not on file    Shawn Martin family history includes Healthy in his mother; Heart attack in his father; Hypertension in his father; Leukemia in his brother.      Objective:    Vitals:   09/12/20 1332  BP: 120/72  Pulse: (!) 113  SpO2: 93%    Physical Exam Well-developed well-nourished older white male in no acute distress.  Accompanied by his wife  weight, 187 BMI 25.3  HEENT; nontraumatic normocephalic, EOMI, PE R LA,  sclera icteric Oropharynx; not examined today Neck; supple, no JVD Cardiovascular; tachy  regular rate and rhythm with S1-S2, no murmur rub or gallop Pulmonary; Clear bilaterally Abdomen; soft, nontender, no significant ascites, no appreciable fluid wave nondistended, no palpable mass or hepatosplenomegaly, bowel sounds are active Rectal; not done today Skin; benign exam, no jaundice rash or appreciable lesions Extremities; no clubbing cyanosis or edema skin warm and dry Neuro/Psych; alert and oriented x4, grossly nonfocal mood and affect appropriate, mentating appropriately, no asterixis       Assessment & Plan:   #58 45 year old white male with decompensated alcoholic cirrhosis status post TIPS April 2021 secondary to refractory ascites/hepatic hydrothorax and esophageal varices. Patient had been abstinent from alcohol and relapsed a few weeks ago and  was admitted 6/10 through 09/11/2020 with alcohol intoxication, and volume overload.  He has completed EtOH withdrawal Patient reinitiated on diuretics and does not have any appreciable ascites or peripheral edema currently TIPS patent on Doppler ultrasound M ELD 19, meld sodium 22 Discriminant function less than 32  No hepatic encephalopathy appreciable currently  #2 celiac disease #3 insomnia  Plan; continue 2 g sodium diet, this was reviewed today Continue complete alcohol abstinence forever patient encouraged to pursue counseling which they are scheduling  Continue lactulose 15 cc twice daily, titrate to have at least 3 bowel movements per day Will decrease Lasix from 40 mg to 20 mg p.o. daily Will decrease Aldactone from 100 mg to 50 mg daily Continue multivitamin, thiamine, folic acid Will repeat labs in 1 week, CBC, c-Met, INR and venous ammonia Patient encouraged to remain out of work for another week Start sleep 3 which is an over-the-counter product containing melatonin and valerian root 1 p.o. 1 hour prior to bedtime,  and add magnesium 400 mg 1 hour before bed  Plan office follow-up with Dr. Havery Moros or myself in 1 month, sooner for any issues. Patient had been scheduled for follow-up with IR for TIPS reevaluation later this week.  Patient's wife will call to see if he needs to keep that appointment given that he just had ultrasound Doppler showing TIPS patency.   Lola Czerwonka S Kameah Rawl PA-C 09/12/2020   Cc: No ref. provider found

## 2020-09-12 NOTE — Progress Notes (Signed)
Agree with assessment and plan as outlined.  

## 2020-09-13 LAB — METHYLMALONIC ACID, SERUM: Methylmalonic Acid, Quantitative: 69 nmol/L (ref 0–378)

## 2020-09-13 LAB — CULTURE, BLOOD (ROUTINE X 2): Special Requests: ADEQUATE

## 2020-09-18 ENCOUNTER — Encounter: Payer: Self-pay | Admitting: *Deleted

## 2020-09-18 ENCOUNTER — Ambulatory Visit
Admission: RE | Admit: 2020-09-18 | Discharge: 2020-09-18 | Disposition: A | Discharge status: Home / Self Care | Payer: BC Managed Care – PPO | Source: Ambulatory Visit | Attending: Interventional Radiology | Admitting: Interventional Radiology

## 2020-09-18 ENCOUNTER — Other Ambulatory Visit: Payer: Self-pay

## 2020-09-18 DIAGNOSIS — I8500 Esophageal varices without bleeding: Secondary | ICD-10-CM

## 2020-09-18 DIAGNOSIS — Z1211 Encounter for screening for malignant neoplasm of colon: Secondary | ICD-10-CM

## 2020-09-18 DIAGNOSIS — D509 Iron deficiency anemia, unspecified: Secondary | ICD-10-CM

## 2020-09-18 DIAGNOSIS — K900 Celiac disease: Secondary | ICD-10-CM

## 2020-09-18 DIAGNOSIS — R188 Other ascites: Secondary | ICD-10-CM

## 2020-09-18 HISTORY — PX: IR RADIOLOGIST EVAL & MGMT: IMG5224

## 2020-09-18 NOTE — Progress Notes (Signed)
Chief Complaint: Patient was consulted remotely today (TeleHealth) for follow up after TIPS.  History of Present Illness: Shawn Martin is a 45 y.o. male with a history of alcoholic cirrhosis, celiac disease, esophageal varices, portal hypertensive gastropathy, refractory ascites and symptomatic recurrent right hepatic hydrothorax who underwent TIPS placement on 07/26/2019. He had follow up TIPS duplex ultrasounds on 08/21/20 and 02/14/20 and was due for one last month but did not obtain it and was hospitalized from 09/07/20-09/11/20 for acute decompensation of cirrhosis due to relapse of alcohol intake with acute worsening of liver function, electrolyte abnormalities and high alcohol level.  He had new abdominal and lower extremity edema at that time. Abdominal duplex ultrasound was performed on 09/08/2020 demonstrating an open TIPS shunt and ascites around the liver which was not present by prior ultrasound on 02/14/2020.  Since hospitalization, he has resumed diuretics, lactulose and a low-sodium diet.  Abdominal and lower extremity swelling has resolved.  He states that he has not consumed alcohol since his hospitalization.  He has followed up with Gastroenterology since discharge from the hospital and is due for another follow-up in 2 weeks.  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    Past Surgical History:  Procedure Laterality Date   IR PARACENTESIS  02/07/2019   IR PARACENTESIS  05/25/2019   IR PARACENTESIS  06/10/2019   IR PARACENTESIS  06/15/2019   IR PARACENTESIS  06/22/2019   IR PARACENTESIS  07/01/2019   IR PARACENTESIS  07/12/2019   IR PARACENTESIS  07/19/2019   IR PARACENTESIS  07/26/2019   IR RADIOLOGIST EVAL & MGMT  07/07/2019   IR RADIOLOGIST EVAL & MGMT  08/23/2019   IR RADIOLOGIST EVAL & MGMT  02/14/2020   IR  THORACENTESIS ASP PLEURAL SPACE W/IMG GUIDE  07/26/2019   IR TIPS  07/26/2019   IR TRANSCATHETER BX  06/22/2019   IR US GUIDE VASC ACCESS RIGHT  06/22/2019   IR VENOGRAM HEPATIC WO HEMODYNAMIC EVALUATION  06/22/2019   NOSE SURGERY     RADIOLOGY WITH ANESTHESIA N/A 07/26/2019   Procedure: TIPS;  Surgeon: Aletta Edouard, MD;  Location: [place];  Service: Radiology;  Laterality: N/A;    Allergies: Gluten meal  Medications: Prior to Admission medications   Medication Sig Start Date End Date Taking? Authorizing Provider  cetirizine (ZYRTEC) 10 MG tablet Take 10 mg by mouth daily.    [provider]  Cholecalciferol (VITAMIN D3) 50 MCG (2000 UT) TABS Take 2,000 Units by mouth daily.    [provider]  ferrous sulfate 325 (65 FE) MG tablet Take 325 mg by mouth daily.    [provider]  folic acid (FOLVITE) 1 MG tablet Take 1 tablet (1 mg total) by mouth daily. 09/12/20   Regalado, Belkys A, MD  furosemide (LASIX) 20 MG tablet Take 1 tablet (20 mg total) by mouth in the morning. 09/12/20   Esterwood, Amy S, PA-C  lactulose, encephalopathy, (CHRONULAC) 10 GM/15ML SOLN Take 15 mLs (10 g total) by mouth 2 (two) times daily. 09/11/20 09/11/21  Regalado, Jerald Kief A, MD  Multiple Vitamin (MULTIVITAMIN WITH MINERALS) TABS tablet Take 1 tablet by mouth daily. 09/12/20   Regalado, Belkys A, MD  spironolactone (ALDACTONE) 50 MG tablet Take 1 tablet (50 mg total) by mouth in the morning. 09/12/20  Esterwood, Amy S, PA-C  thiamine 100 MG tablet Take 1 tablet (100 mg total) by mouth daily. 09/12/20   Regalado, Belkys A, MD  zinc gluconate 50 MG tablet Take 50 mg by mouth daily.    [provider]     Family History  Problem Relation Age of Onset   Healthy Mother    Hypertension Father    Heart attack Father    Leukemia Brother    Colon cancer Neg Hx    Stomach cancer Neg Hx    Pancreatic cancer Neg Hx     Social History   Socioeconomic History   Marital status: Married     Spouse name: Not on file   Number of children: 2   Years of education: Not on file   Highest education level: Not on file  Occupational History   Not on file  Tobacco Use   Smoking status: Former    Pack years: 0.00   Smokeless tobacco: Former    Types: Chew    Quit date: 03/2017  Vaping Use   Vaping Use: Never used  Substance and Sexual Activity   Alcohol use: Yes    Comment: every day drinker, 6 pack per day   Drug use: Not Currently   Sexual activity: Yes    Partners: Female  Other Topics Concern   Not on file  Social History Narrative   Not on file   Social Determinants of Health   Financial Resource Strain: Not on file  Food Insecurity: Not on file  Transportation Needs: Not on file  Physical Activity: Not on file  Stress: Not on file  Social Connections: Not on file    Review of Systems  Constitutional: Negative.   Respiratory: Negative.    Cardiovascular: Negative.   Gastrointestinal: Negative.   Genitourinary: Negative.   Musculoskeletal: Negative.   Neurological: Negative.    Review of Systems: A 12 point ROS discussed and pertinent positives are indicated in the HPI above.  All other systems are negative.  Physical Exam No direct physical exam was performed (except for noted visual exam findings with Video Visits).   Vital Signs: There were no vitals taken for this visit.  Imaging: DG Chest 2 View  Result Date: 09/07/2020 CLINICAL DATA:  Confusion EXAM: CHEST - 2 VIEW COMPARISON:  07/30/2019 FINDINGS: Cardiac shadow is mildly prominent but accentuated by the portable technique. The overall inspiratory effort is poor. Minimal blunting of the left costophrenic angle is noted consistent with small effusion. No focal infiltrate is noted. No bony abnormality is seen. IMPRESSION: Minimal left pleural effusion.  No other focal abnormality is noted. Electronically Signed   By: Inez Catalina M.D.   On: 09/07/2020 20:30   CT HEAD WO CONTRAST  Result Date:  09/07/2020 CLINICAL DATA:  Altered mental status EXAM: CT HEAD WITHOUT CONTRAST TECHNIQUE: Contiguous axial images were obtained from the base of the skull through the vertex without intravenous contrast. COMPARISON:  None. FINDINGS: Brain: Image quality is degraded by motion. Mild cerebral atrophy. No acute intracranial abnormality. Specifically, no hemorrhage, hydrocephalus, mass lesion, acute infarction, or significant intracranial injury. Vascular: No hyperdense vessel or unexpected calcification. Skull: No acute calvarial abnormality. Sinuses/Orbits: No acute findings Other: None IMPRESSION: Mild image quality degradation due to motion. No visible acute intracranial abnormality. Electronically Signed   By: Rolm Baptise M.D.   On: 09/07/2020 20:38   US LIVER DOPPLER  Result Date: 09/08/2020 CLINICAL DATA:  45 year old male with a history of prior  tips 07/26/2019 and abdominal swelling EXAM: DUPLEX ULTRASOUND OF LIVER TECHNIQUE: Color and duplex Doppler ultrasound was performed to evaluate the hepatic in-flow and out-flow vessels. COMPARISON:  02/14/2020 FINDINGS: Portal Vein Velocities Main:  43 cm/sec Right:  45 cm/sec Left:  32 cm/sec Tips Proximal: 85 centimeter/second Mid: 104 centimeter/second Distal: 186 centimeter/second Hepatic Vein Velocities Right:  117 cm/sec Middle:  17 cm/sec Left:  26 cm/sec Hepatic Artery Velocity:  118 cm/sec Splenic Vein Velocity:  28 cm/sec Varices: None visualized Ascites: Ascites present Cirrhotic changes of the liver IMPRESSION: Directed duplex demonstrates patent TIPS with low resistance waveform. Ascites present. Signed, Dulcy Fanny. Dellia Nims, RPVI [facility] Radiology Electronically Signed   By: Corrie Mckusick D.O.   On: 09/08/2020 16:14   VAS [place] LOWER EXTREMITY VENOUS (DVT)  Result Date: 09/10/2020  Lower Venous DVT Study Patient Name:  Heather FAIN Martin  Date of Exam:   09/09/2020 Medical Rec #: [id number]              Accession #:    [id number] Date of Birth: July 26, 1975              Patient Gender: M Patient Age:   45Y Exam Location:  [facility] Procedure:      VAS [place] LOWER EXTREMITY VENOUS (DVT) Referring Phys: [id number] PARDEEP KUMAR --------------------------------------------------------------------------------  Indications: Swelling in patient with portal HTN, TIPS.  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 Comparison Study: 07-01-2019 Prior bilateral lower extremity venous study was                   negative for DVT. Performing Technologist: Darlin Coco RDMS,RVT  Examination Guidelines: A complete evaluation includes B-mode imaging, spectral Doppler, color Doppler, and power Doppler as needed of all accessible portions of each vessel. Bilateral testing is considered an integral part of a complete examination. Limited examinations for reoccurring indications may be performed as noted. The reflux portion of the exam is performed with the patient in reverse Trendelenburg.  +---------+---------------+---------+-----------+----------+--------------+ RIGHT    CompressibilityPhasicitySpontaneityPropertiesThrombus Aging +---------+---------------+---------+-----------+----------+--------------+ CFV      Full           Yes      Yes                                 +---------+---------------+---------+-----------+----------+--------------+ SFJ      Full                                                        +---------+---------------+---------+-----------+----------+--------------+ FV Prox  Full                                                        +---------+---------------+---------+-----------+----------+--------------+ FV Mid   Full                                                        +---------+---------------+---------+-----------+----------+--------------+ FV DistalFull                                                         +---------+---------------+---------+-----------+----------+--------------+  PFV      Full                                                        +---------+---------------+---------+-----------+----------+--------------+ POP      Full           Yes      Yes                                 +---------+---------------+---------+-----------+----------+--------------+ PTV      Full                                                        +---------+---------------+---------+-----------+----------+--------------+ PERO     Full                                                        +---------+---------------+---------+-----------+----------+--------------+ Gastroc  Full                                                        +---------+---------------+---------+-----------+----------+--------------+   +---------+---------------+---------+-----------+----------+--------------+ LEFT     CompressibilityPhasicitySpontaneityPropertiesThrombus Aging +---------+---------------+---------+-----------+----------+--------------+ CFV      Full           Yes      Yes                                 +---------+---------------+---------+-----------+----------+--------------+ SFJ      Full                                                        +---------+---------------+---------+-----------+----------+--------------+ FV Prox  Full                                                        +---------+---------------+---------+-----------+----------+--------------+ FV Mid   Full                                                        +---------+---------------+---------+-----------+----------+--------------+ FV DistalFull                                                        +---------+---------------+---------+-----------+----------+--------------+  PFV      Full                                                         +---------+---------------+---------+-----------+----------+--------------+ POP      Full           Yes      Yes                                 +---------+---------------+---------+-----------+----------+--------------+ PTV      Full                                                        +---------+---------------+---------+-----------+----------+--------------+ PERO     Full                                                        +---------+---------------+---------+-----------+----------+--------------+ Gastroc  Full                                                        +---------+---------------+---------+-----------+----------+--------------+     Summary: RIGHT: - There is no evidence of deep vein thrombosis in the lower extremity.  - No cystic structure found in the popliteal fossa.  LEFT: - There is no evidence of deep vein thrombosis in the lower extremity.  - No cystic structure found in the popliteal fossa.  *See table(s) above for measurements and observations. Electronically signed by Jamelle Haring on 09/10/2020 at 10:51:11 AM.    Final    US Abdomen Limited RUQ (LIVER/GB)  Result Date: 09/09/2020 CLINICAL DATA:  Cirrhosis.  Follow-up. EXAM: ULTRASOUND ABDOMEN LIMITED RIGHT UPPER QUADRANT COMPARISON:  September 08, 2020 FINDINGS: Gallbladder: No gallstones or wall thickening visualized. No sonographic Murphy sign noted by sonographer. Common bile duct: Diameter: 2 mm Liver: Nodular contour consistent with cirrhosis. Heterogeneous echotexture. Portal vein is patent on color Doppler imaging with normal direction of blood flow towards the liver. Other: Ascites identified in the right upper quadrant. IMPRESSION: Cirrhotic liver. Ascites in the right upper quadrant. No other abnormalities. Electronically Signed   By: Dorise Bullion III M.D   On: 09/09/2020 15:54    Labs:  CBC: Recent Labs    09/09/20 0521 09/10/20 0521 09/10/20 2043 09/11/20 0516  WBC 4.1 4.8 5.6 5.1   HGB 12.0* 11.8* 12.2* 11.6*  HCT 34.5* 34.1* 35.7* 34.1*  PLT 106* 96* 105* 109*    COAGS: Recent Labs    09/08/20 0536 09/09/20 0521 09/10/20 0521 09/11/20 0516  INR 1.5* 1.5* 1.6* 1.6*    BMP: Recent Labs    09/09/20 0521 09/10/20 0521 09/10/20 2043 09/11/20 0516  NA 133* 132* 129* 133*  K 2.9* 3.0* 3.4* 3.5  CL 93* 96* 95* 97*  CO2 30 29 28  27  GLUCOSE 123* 114* 127* 109*  BUN <5* 8 7 8   CALCIUM 7.7* 7.7* 8.1* 8.3*  CREATININE 0.51* 0.55* 0.62 0.46*  GFRNONAA >60 >60 >60 >60    LIVER FUNCTION TESTS: Recent Labs    09/09/20 0521 09/10/20 0521 09/10/20 2043 09/11/20 0516  BILITOT 5.9* 6.6* 5.2* 6.0*  AST 151* 120* 115* 114*  ALT 51* 42 43 42  ALKPHOS 146* 143* 149* 134*  PROT 6.6 6.1* 6.3* 6.3*  ALBUMIN 2.6* 2.3* 2.5* 2.5*    TUMOR MARKERS: Recent Labs    12/08/19 1610 09/07/20 1001  AFPTM 3.1 5.6    Assessment and Plan:  [name] is now just over 1 year status post TIPS for cirrhosis, ascites and symptomatic right hepatic hydrothorax.  Recent decompensation of cirrhosis with hospital admission.  Duplex ultrasound during recent hospitalization demonstrates normally patent TIPS shunt.  Fluid overload has now been corrected with diuretics.  I have recommended a follow-up duplex ultrasound of the TIPS in 1 year.  In the meantime, he will continue to follow-up with Dr. Havery Moros and Roosevelt Locks, NP.   Electronically Signed: Azzie Roup 09/18/2020, 2:22 PM    I spent a total of 10 Minutes in remote  clinical consultation, greater than 50% of which was counseling/coordinating care post TIPS.    Visit type: Audio only (telephone). Audio (no video) only due to patient's lack of internet/smartphone capability. Alternative for in-person consultation at [facility], [address], [place], [place]. This visit type was conducted due to national recommendations for restrictions regarding the COVID-19 Pandemic (e.g. social distancing).   This format is felt to be most appropriate for this patient at this time.  All issues noted in this document were discussed and addressed.

## 2020-09-21 ENCOUNTER — Other Ambulatory Visit (INDEPENDENT_AMBULATORY_CARE_PROVIDER_SITE_OTHER): Payer: BC Managed Care – PPO

## 2020-09-21 ENCOUNTER — Other Ambulatory Visit: Payer: Self-pay

## 2020-09-21 DIAGNOSIS — K7460 Unspecified cirrhosis of liver: Secondary | ICD-10-CM

## 2020-09-21 DIAGNOSIS — G9340 Encephalopathy, unspecified: Secondary | ICD-10-CM | POA: Diagnosis not present

## 2020-09-21 DIAGNOSIS — B1920 Unspecified viral hepatitis C without hepatic coma: Secondary | ICD-10-CM | POA: Diagnosis not present

## 2020-09-21 DIAGNOSIS — D509 Iron deficiency anemia, unspecified: Secondary | ICD-10-CM

## 2020-09-21 DIAGNOSIS — E8770 Fluid overload, unspecified: Secondary | ICD-10-CM

## 2020-09-21 DIAGNOSIS — K7469 Other cirrhosis of liver: Secondary | ICD-10-CM

## 2020-09-21 DIAGNOSIS — G4700 Insomnia, unspecified: Secondary | ICD-10-CM

## 2020-09-21 LAB — COMPREHENSIVE METABOLIC PANEL
ALT: 55 U/L — ABNORMAL HIGH (ref 0–53)
AST: 157 U/L — ABNORMAL HIGH (ref 0–37)
Albumin: 3.2 g/dL — ABNORMAL LOW (ref 3.5–5.2)
Alkaline Phosphatase: 183 U/L — ABNORMAL HIGH (ref 39–117)
BUN: 6 mg/dL (ref 6–23)
CO2: 30 mEq/L (ref 19–32)
Calcium: 7.9 mg/dL — ABNORMAL LOW (ref 8.4–10.5)
Chloride: 101 mEq/L (ref 96–112)
Creatinine, Ser: 0.52 mg/dL (ref 0.40–1.50)
GFR: 120.34 mL/min (ref >60.00)
Glucose, Bld: 147 mg/dL — ABNORMAL HIGH (ref 70–99)
Potassium: 3.6 mEq/L (ref 3.5–5.1)
Sodium: 138 mEq/L (ref 135–145)
Total Bilirubin: 4.3 mg/dL — ABNORMAL HIGH (ref 0.2–1.2)
Total Protein: 7.4 g/dL (ref 6.0–8.3)

## 2020-09-21 LAB — CBC WITH DIFFERENTIAL/PLATELET
Basophils Absolute: 0 10*3/uL (ref 0.0–0.1)
Basophils Relative: 1 % (ref 0.0–3.0)
Eosinophils Absolute: 0.1 10*3/uL (ref 0.0–0.7)
Eosinophils Relative: 3.6 % (ref 0.0–5.0)
HCT: 38.5 % — ABNORMAL LOW (ref 39.0–52.0)
Hemoglobin: 13 g/dL (ref 13.0–17.0)
Lymphocytes Relative: 36.2 % (ref 12.0–46.0)
Lymphs Abs: 1.1 10*3/uL (ref 0.7–4.0)
MCHC: 33.7 g/dL (ref 30.0–36.0)
MCV: 106.2 fl — ABNORMAL HIGH (ref 78.0–100.0)
Monocytes Absolute: 0.4 10*3/uL (ref 0.1–1.0)
Monocytes Relative: 12.9 % — ABNORMAL HIGH (ref 3.0–12.0)
Neutro Abs: 1.4 10*3/uL (ref 1.4–7.7)
Neutrophils Relative %: 46.3 % (ref 43.0–77.0)
Platelets: 146 10*3/uL — ABNORMAL LOW (ref 150.0–400.0)
RBC: 3.62 Mil/uL — ABNORMAL LOW (ref 4.22–5.81)
RDW: 17.1 % — ABNORMAL HIGH (ref 11.5–15.5)
WBC: 3.1 10*3/uL — ABNORMAL LOW (ref 4.0–10.5)

## 2020-09-21 LAB — AMMONIA: Ammonia: 31 umol/L (ref 11–35)

## 2020-09-21 LAB — PROTIME-INR
INR: 1.4 ratio — ABNORMAL HIGH (ref 0.8–1.0)
Prothrombin Time: 15.5 s — ABNORMAL HIGH (ref 9.6–13.1)

## 2020-09-21 NOTE — Telephone Encounter (Signed)
Formatting of this note might be different from the original.  Unable to contact to reschedule no show  No answer, no vmail  Mailed unable to contact ltr  Electronically signed by Laurie Panda at 09/21/2020 10:51 AM EDT

## 2020-10-08 ENCOUNTER — Other Ambulatory Visit: Payer: Self-pay

## 2020-10-08 ENCOUNTER — Other Ambulatory Visit: Payer: Self-pay | Admitting: Internal Medicine

## 2020-10-09 ENCOUNTER — Other Ambulatory Visit: Payer: Self-pay | Admitting: Gastroenterology

## 2020-10-09 ENCOUNTER — Other Ambulatory Visit: Payer: Self-pay

## 2020-10-10 ENCOUNTER — Other Ambulatory Visit: Payer: Self-pay

## 2020-10-12 ENCOUNTER — Other Ambulatory Visit: Payer: Self-pay

## 2020-10-15 ENCOUNTER — Other Ambulatory Visit: Payer: Self-pay

## 2020-10-15 MED ORDER — FOLIC ACID 1 MG PO TABS
1.0000 mg | ORAL_TABLET | Freq: Every day | ORAL | 0 refills | Status: DC
  Filled 2020-10-15: qty 30, 30d supply, fill #0

## 2020-10-21 ENCOUNTER — Other Ambulatory Visit: Payer: Self-pay

## 2020-10-21 ENCOUNTER — Encounter: Payer: Self-pay | Admitting: Emergency Medicine

## 2020-10-21 ENCOUNTER — Emergency Department
Admission: EM | Admit: 2020-10-21 | Discharge: 2020-10-21 | Disposition: A | Discharge status: Home / Self Care | Payer: BC Managed Care – PPO | Attending: Emergency Medicine | Admitting: Emergency Medicine

## 2020-10-21 DIAGNOSIS — F1010 Alcohol abuse, uncomplicated: Secondary | ICD-10-CM | POA: Diagnosis not present

## 2020-10-21 DIAGNOSIS — Y908 Blood alcohol level of 240 mg/100 ml or more: Secondary | ICD-10-CM | POA: Diagnosis not present

## 2020-10-21 DIAGNOSIS — I10 Essential (primary) hypertension: Secondary | ICD-10-CM | POA: Insufficient documentation

## 2020-10-21 DIAGNOSIS — Z79899 Other long term (current) drug therapy: Secondary | ICD-10-CM | POA: Insufficient documentation

## 2020-10-21 DIAGNOSIS — Z87891 Personal history of nicotine dependence: Secondary | ICD-10-CM | POA: Diagnosis not present

## 2020-10-21 LAB — ACETAMINOPHEN LEVEL: Acetaminophen (Tylenol), Serum: <10 ug/mL — ABNORMAL LOW (ref 10–30)

## 2020-10-21 LAB — COMPREHENSIVE METABOLIC PANEL
ALT: 42 U/L (ref 0–44)
AST: 141 U/L — ABNORMAL HIGH (ref 15–41)
Albumin: 2.7 g/dL — ABNORMAL LOW (ref 3.5–5.0)
Alkaline Phosphatase: 163 U/L — ABNORMAL HIGH (ref 38–126)
Anion gap: 13 (ref 5–15)
BUN: 6 mg/dL (ref 6–20)
CO2: 26 mmol/L (ref 22–32)
Calcium: 7.9 mg/dL — ABNORMAL LOW (ref 8.9–10.3)
Chloride: 101 mmol/L (ref 98–111)
Creatinine, Ser: 0.76 mg/dL (ref 0.61–1.24)
GFR, Estimated: >60 mL/min (ref >60)
Glucose, Bld: 132 mg/dL — ABNORMAL HIGH (ref 70–99)
Potassium: 3.6 mmol/L (ref 3.5–5.1)
Sodium: 140 mmol/L (ref 135–145)
Total Bilirubin: 5.5 mg/dL — ABNORMAL HIGH (ref 0.3–1.2)
Total Protein: 7.5 g/dL (ref 6.5–8.1)

## 2020-10-21 LAB — CBC
HCT: 35.7 % — ABNORMAL LOW (ref 39.0–52.0)
Hemoglobin: 12.7 g/dL — ABNORMAL LOW (ref 13.0–17.0)
MCH: 35.3 pg — ABNORMAL HIGH (ref 26.0–34.0)
MCHC: 35.6 g/dL (ref 30.0–36.0)
MCV: 99.2 fL (ref 80.0–100.0)
Platelets: 142 10*3/uL — ABNORMAL LOW (ref 150–400)
RBC: 3.6 MIL/uL — ABNORMAL LOW (ref 4.22–5.81)
RDW: 16.6 % — ABNORMAL HIGH (ref 11.5–15.5)
WBC: 3.8 10*3/uL — ABNORMAL LOW (ref 4.0–10.5)
nRBC: 0 % (ref 0.0–0.2)

## 2020-10-21 LAB — SALICYLATE LEVEL: Salicylate Lvl: <7 mg/dL — ABNORMAL LOW (ref 7.0–30.0)

## 2020-10-21 LAB — ETHANOL: Alcohol, Ethyl (B): 365 mg/dL (ref <10)

## 2020-10-21 MED ORDER — LORAZEPAM 2 MG/ML IJ SOLN: 0.0000 mg | Freq: Four times a day (QID) | INTRAMUSCULAR | Status: DC

## 2020-10-21 MED ORDER — LORAZEPAM 2 MG PO TABS: 0.0000 mg | ORAL_TABLET | Freq: Two times a day (BID) | ORAL | Status: DC

## 2020-10-21 MED ORDER — THIAMINE HCL 100 MG/ML IJ SOLN: 100.0000 mg | Freq: Every day | INTRAMUSCULAR | Status: DC

## 2020-10-21 MED ORDER — THIAMINE HCL 100 MG PO TABS
100.0000 mg | ORAL_TABLET | Freq: Every day | ORAL | Status: DC
  Administered 2020-10-21: 100 mg via ORAL
  Filled 2020-10-21: qty 1

## 2020-10-21 MED ORDER — LORAZEPAM 2 MG/ML IJ SOLN: 0.0000 mg | Freq: Two times a day (BID) | INTRAMUSCULAR | Status: DC

## 2020-10-21 MED ORDER — LORAZEPAM 2 MG PO TABS: 0.0000 mg | ORAL_TABLET | Freq: Four times a day (QID) | ORAL | Status: DC

## 2020-10-21 NOTE — ED Notes (Signed)
MD made aware ETOH [id number]

## 2020-10-21 NOTE — ED Notes (Signed)
Pt given 2 cups of water and encouraged to drink to decrease his heart rate.

## 2020-10-21 NOTE — ED Notes (Signed)
Pt discharged home.  Pt denies SI.  No belongings or clothing had been taken from patient.
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
## 2020-10-21 NOTE — ED Provider Notes (Signed)
[facility] Emergency Department Provider Note  ____________________________________________   Event Date/Time   First MD Initiated Contact with Patient 10/21/20 1122     (approximate)  I have reviewed the triage vital signs and the nursing notes.   HISTORY  Chief Complaint Mental Health Problem   HPI Shawn Martin is a 45 y.o. male with a past medical history of anemia, celiac disease, cirrhosis and alcohol abuse who presents for assessment stating he got an argument with his wife who wanted to get checked out.  He states he has not had anything to drink since at least yesterday although he is a fairly heavy binge drinker.  States he typically drinks heavily about 2 or 3 times a week is been cutting a lot of problems with him in between his wife.  He denies any SI, HI or hallucinations.  Denies any other illicit drug use.  States he is otherwise in his usual state of health without any recent injuries or falls, fevers, chills, cough, nausea, vomiting, [place] dysuria, rash or any other acute concerns at this time.  When asked why he is emergency room or what this provider can do for him he states "ask my wife".          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    Patient Active Problem List   Diagnosis Date Noted   Acute encephalopathy 09/08/2020   Acute alcohol intoxication ([place]) 09/08/2020   Alcohol abuse with intoxication ([place]) 09/08/2020   Lactic acidosis 09/08/2020   Hypokalemia 09/08/2020   Allergic rhinitis 09/08/2020   Cirrhosis ([place]) 07/26/2019   Acute respiratory failure with hypoxia ([place]) 07/26/2019   Hyperkalemia 06/30/2019   AKI (acute kidney injury) ([place]) 06/30/2019   Hyponatremia 06/03/2019   Alcohol dependence ([place]) 06/03/2019   Symptomatic anemia 06/03/2019   Ascites  due to alcoholic cirrhosis ([place]) 12/45/8099   Alcoholic cirrhosis of liver with ascites ([place]) 01/28/2019   Screening for viral disease 01/28/2019    Past Surgical History:  Procedure Laterality Date   IR PARACENTESIS  02/07/2019   IR PARACENTESIS  05/25/2019   IR PARACENTESIS  06/10/2019   IR PARACENTESIS  06/15/2019   IR PARACENTESIS  06/22/2019   IR PARACENTESIS  07/01/2019   IR PARACENTESIS  07/12/2019   IR PARACENTESIS  07/19/2019   IR PARACENTESIS  07/26/2019   IR RADIOLOGIST EVAL & MGMT  07/07/2019   IR RADIOLOGIST EVAL & MGMT  08/23/2019   IR RADIOLOGIST EVAL & MGMT  02/14/2020   IR RADIOLOGIST EVAL & MGMT  09/18/2020   IR THORACENTESIS ASP PLEURAL SPACE W/IMG GUIDE  07/26/2019   IR TIPS  07/26/2019   IR TRANSCATHETER BX  06/22/2019   IR US GUIDE VASC ACCESS RIGHT  06/22/2019   IR VENOGRAM HEPATIC WO HEMODYNAMIC EVALUATION  06/22/2019   NOSE SURGERY     RADIOLOGY WITH ANESTHESIA N/A 07/26/2019   Procedure: TIPS;  Surgeon: Aletta Edouard, MD;  Location: [facility];  Service: Radiology;  Laterality: N/A;    Prior to Admission medications   Medication Sig Start Date End Date Taking? Authorizing Provider  cetirizine (ZYRTEC) 10 MG tablet Take 10 mg by mouth daily.    [provider]  Cholecalciferol (VITAMIN D3) 50 MCG (2000 UT) TABS Take 2,000 Units by mouth daily.  [provider]  ferrous sulfate 325 (65 FE) MG tablet Take 325 mg by mouth daily.    [provider]  folic acid (FOLVITE) 1 MG tablet Take 1 tablet (1 mg total) by mouth daily. 10/15/20   Armbruster, [name], MD  furosemide (LASIX) 20 MG tablet Take 1 tablet (20 mg total) by mouth in the morning. 09/12/20   Esterwood, Amy S, PA-C  lactulose, encephalopathy, (CHRONULAC) 10 GM/15ML SOLN Take 15 mLs (10 g total) by mouth 2 (two) times daily. 09/11/20 09/11/21  Regalado, Jerald Kief A, MD  Multiple Vitamin (MULTIVITAMIN WITH MINERALS) TABS tablet Take 1 tablet by mouth daily. 09/12/20   Regalado, Belkys A, MD   spironolactone (ALDACTONE) 50 MG tablet Take 1 tablet (50 mg total) by mouth in the morning. 09/12/20   Esterwood, Amy S, PA-C  thiamine 100 MG tablet Take 1 tablet (100 mg total) by mouth daily. 09/12/20   Regalado, Belkys A, MD  zinc gluconate 50 MG tablet Take 50 mg by mouth daily.    [provider]    Allergies Gluten meal  Family History  Problem Relation Age of Onset   Healthy Mother    Hypertension Father    Heart attack Father    Leukemia Brother    Colon cancer Neg Hx    Stomach cancer Neg Hx    Pancreatic cancer Neg Hx     Social History Social History   Tobacco Use   Smoking status: Former   Smokeless tobacco: Former    Types: Chew    Quit date: 03/2017  Vaping Use   Vaping Use: Never used  Substance Use Topics   Alcohol use: Yes    Comment: every day drinker, 6 pack per day   Drug use: Not Currently    Review of Systems  Review of Systems  Constitutional:  Negative for chills and fever.  HENT:  Negative for sore throat.   Eyes:  Negative for pain.  Respiratory:  Negative for cough and stridor.   Cardiovascular:  Negative for chest pain.  Gastrointestinal:  Negative for vomiting.  Musculoskeletal:  Negative for myalgias.  Skin:  Negative for rash.  Neurological:  Negative for seizures, loss of consciousness and headaches.  Psychiatric/Behavioral:  Positive for substance abuse. Negative for suicidal ideas.   All other systems reviewed and are negative.    ____________________________________________   PHYSICAL EXAM:  VITAL SIGNS: ED Triage Vitals [10/21/20 1037]  Enc Vitals Group     BP (!) 158/92     Pulse Rate (!) 129     Resp 20     Temp 99 F (37.2 C)     Temp Source Oral     SpO2 97 %     Weight 200 lb (90.7 kg)     Height 6' (1.829 m)     Head Circumference      Peak Flow      Pain Score 0     Pain Loc      Pain Edu?      Excl. in [facility]?    Vitals:   10/21/20 1037 10/21/20 1321  BP: (!) 158/92 140/80  Pulse: (!) 129  100  Resp: 20 18  Temp: 99 F (37.2 C) 99 F (37.2 C)  SpO2: 97% 98%   Physical Exam Vitals and nursing note reviewed.  Constitutional:      Appearance: He is well-developed.  HENT:     Head: Normocephalic and atraumatic.     Right Ear:  External ear normal.     Left Ear: External ear normal.     Nose: Nose normal.  Eyes:     Conjunctiva/sclera: Conjunctivae normal.  Cardiovascular:     Rate and Rhythm: Regular rhythm. Tachycardia present.     Heart sounds: No murmur heard. Pulmonary:     Effort: Pulmonary effort is normal. No respiratory distress.     Breath sounds: Normal breath sounds.  Abdominal:     Palpations: Abdomen is soft.     Tenderness: There is no abdominal tenderness.  Musculoskeletal:     Cervical back: Neck supple.  Skin:    General: Skin is warm and dry.  Neurological:     Mental Status: He is alert.  Psychiatric:        Mood and Affect: Mood normal.        Speech: Speech is slurred.        Thought Content: Thought content does not include homicidal or suicidal ideation.     ____________________________________________   LABS (all labs ordered are listed, but only abnormal results are displayed)  Labs Reviewed  COMPREHENSIVE METABOLIC PANEL - Abnormal; Notable for the following components:      Result Value   Glucose, Bld 132 (*)    Calcium 7.9 (*)    Albumin 2.7 (*)    AST 141 (*)    Alkaline Phosphatase 163 (*)    Total Bilirubin 5.5 (*)    All other components within normal limits  ETHANOL - Abnormal; Notable for the following components:   Alcohol, Ethyl (B) 365 (*)    All other components within normal limits  SALICYLATE LEVEL - Abnormal; Notable for the following components:   Salicylate Lvl <5.8 (*)    All other components within normal limits  ACETAMINOPHEN LEVEL - Abnormal; Notable for the following components:   Acetaminophen (Tylenol), Serum <10 (*)    All other components within normal limits  CBC - Abnormal; Notable for the  following components:   WBC 3.8 (*)    RBC 3.60 (*)    Hemoglobin 12.7 (*)    HCT 35.7 (*)    MCH 35.3 (*)    RDW 16.6 (*)    Platelets 142 (*)    All other components within normal limits  URINE DRUG SCREEN, QUALITATIVE ([facility] ONLY)   ____________________________________________  EKG  ____________________________________________  RADIOLOGY  ED MD interpretation:    Official radiology report(s): No results found.  ____________________________________________   PROCEDURES  Procedure(s) performed (including Critical Care):  Procedures   ____________________________________________   INITIAL IMPRESSION / ASSESSMENT AND PLAN / ED COURSE      Patient presents with above-stated history and exam for evaluation after he states he got an argument with his wife who wanted to get checked out.  States he was drinking problem intractable yesterday but none today.  He does appear mildly intoxicated on exam.  He is tachycardic hypertensive with otherwise stable vital signs.  He does not be actively hallucinating or psychotic.  He denies any other acute complaints including any SI HI or hallucinations.  I was able to reach his wife who states she wanted him to get help with his alcohol abuse is worried about his mood.  She has no other acute concerns at this time.  Overall suspect patient's alcohol abuse is likely causing significant harm not only to him with history of cirrhosis and ongoing physical damage but also likely causing him to feel bad but denies any depression or SI.  Suspect this is also likely causing significant problems in his relationship.  Low suspicion for any significant recent trauma, acute infectious process, or patient being acutely suicidal homicidal or psychotic.  CMP shows stable AST and alk phos without any other acute derangements.  Bilirubin is stable at 5.5 compared to 4.3 and 6 last month.  Serum ethanol elevated at 365.  Acetaminophen and salicylate levels  ordered in triage unremarkable.  BC shows mild leukopenia stable from a month ago.  Impression is alcohol intoxication causing some interpersonal relationship problems and likely ongoing liver damage.  Discussed this with patient recommendation that he obtain outpatient counseling and assistance with this.  Do not believe he is an active withdrawal is on reassessment at approximately 3 hours his heart rate had decreased after him just drinking water.  Given he is more clinically sober on my exam and able to ambulate with steady gait and not slurring his words and again denying any SI or HI I think he may be discharged with plan for outpatient [facility] and PCP follow-up.  Discharged in stable condition.  Counseled extensively on dangers of alcohol abuse.      ____________________________________________   FINAL CLINICAL IMPRESSION(S) / ED DIAGNOSES  Final diagnoses:  Alcohol abuse    Medications  LORazepam (ATIVAN) injection 0-4 mg ( Intravenous See Alternative 10/21/20 1205)    Or  LORazepam (ATIVAN) tablet 0-4 mg (0 mg Oral Not Given 10/21/20 1205)  LORazepam (ATIVAN) injection 0-4 mg (has no administration in time range)    Or  LORazepam (ATIVAN) tablet 0-4 mg (has no administration in time range)  thiamine tablet 100 mg (100 mg Oral Given 10/21/20 1208)    Or  thiamine (B-1) injection 100 mg ( Intravenous See Alternative 10/21/20 1208)     ED Discharge Orders     None        Note:  This document was prepared using Dragon voice recognition software and may include unintentional dictation errors.    Lucrezia Starch, MD 10/21/20 [phone]

## 2020-10-21 NOTE — ED Notes (Signed)
Dr. Tamala Julian made aware of 365 ethanol level.

## 2020-10-22 ENCOUNTER — Other Ambulatory Visit: Payer: Self-pay

## 2020-10-24 ENCOUNTER — Telehealth: Payer: Self-pay | Admitting: Physician Assistant

## 2020-10-24 MED ORDER — SPIRONOLACTONE 50 MG PO TABS
50.0000 mg | ORAL_TABLET | Freq: Every morning | ORAL | 0 refills | Status: DC
  Filled 2020-10-24 – 2021-09-30 (×3): qty 90, 90d supply, fill #0

## 2020-10-24 MED ORDER — FERROUS SULFATE 325 (65 FE) MG PO TABS
325.0000 mg | ORAL_TABLET | Freq: Every day | ORAL | 0 refills | Status: DC
  Filled 2020-10-24: qty 90, 90d supply, fill #0

## 2020-10-24 MED ORDER — FUROSEMIDE 20 MG PO TABS
20.0000 mg | ORAL_TABLET | Freq: Every morning | ORAL | 0 refills | Status: DC
  Filled 2020-10-24 – 2020-10-25 (×2): qty 90, 90d supply, fill #0

## 2020-10-24 NOTE — Telephone Encounter (Signed)
A 90 day supply of all three medications has been sent to [facility].

## 2020-10-24 NOTE — Telephone Encounter (Signed)
Patients wife called requesting a refill on his medications said he will be at a [facility] and she is requesting 3 month supply if possible of the Folic Acid, Lasix and Spironolactone.

## 2020-10-25 ENCOUNTER — Other Ambulatory Visit: Payer: Self-pay

## 2020-11-14 ENCOUNTER — Ambulatory Visit: Payer: BC Managed Care – PPO | Admitting: Gastroenterology

## 2021-04-09 ENCOUNTER — Other Ambulatory Visit: Payer: Self-pay

## 2021-04-09 ENCOUNTER — Other Ambulatory Visit: Payer: Self-pay | Admitting: Physician Assistant

## 2021-04-09 MED ORDER — FUROSEMIDE 20 MG PO TABS
20.0000 mg | ORAL_TABLET | Freq: Every morning | ORAL | 0 refills | Status: DC
  Filled 2021-04-09 (×2): qty 90, 90d supply, fill #0

## 2021-05-10 ENCOUNTER — Telehealth: Payer: BC Managed Care – PPO | Admitting: Family Medicine

## 2021-05-10 DIAGNOSIS — J069 Acute upper respiratory infection, unspecified: Secondary | ICD-10-CM

## 2021-05-10 MED ORDER — BENZONATATE 100 MG PO CAPS: 100.0000 mg | ORAL_CAPSULE | Freq: Two times a day (BID) | ORAL | 0 refills | Status: DC | PRN

## 2021-05-10 MED ORDER — IPRATROPIUM BROMIDE 0.03 % NA SOLN: 2.0000 | Freq: Two times a day (BID) | NASAL | 0 refills | Status: DC

## 2021-05-10 NOTE — Progress Notes (Signed)
E-Visit for Upper Respiratory Infection   We are sorry you are not feeling well.  Here is how we plan to help!  Based on what you have shared with me, it looks like you may have a viral upper respiratory infection.  Upper respiratory infections are caused by a large number of viruses; however, rhinovirus is the most common cause.   Symptoms vary from person to person, with common symptoms including sore throat, cough, fatigue or lack of energy and feeling of general discomfort.  A low-grade fever of up to 100.4 may present, but is often uncommon.  Symptoms vary however, and are closely related to a person's age or underlying illnesses.  The most common symptoms associated with an upper respiratory infection are nasal discharge or congestion, cough, sneezing, headache and pressure in the ears and face.  These symptoms usually persist for about 3 to 10 days, but can last up to 2 weeks.  It is important to know that upper respiratory infections do not cause serious illness or complications in most cases.    Upper respiratory infections can be transmitted from person to person, with the most common method of transmission being a person's hands.  The virus is able to live on the skin and can infect other persons for up to 2 hours after direct contact.  Also, these can be transmitted when someone coughs or sneezes; thus, it is important to cover the mouth to reduce this risk.  To keep the spread of the illness at [place], good hand hygiene is very important.  This is an infection that is most likely caused by a virus. There are no specific treatments other than to help you with the symptoms until the infection runs its course.  We are sorry you are not feeling well.  Here is how we plan to help!   For nasal congestion, you may use an oral decongestants such as Mucinex D or if you have glaucoma or high blood pressure use plain Mucinex.  Saline nasal spray or nasal drops can help and can safely be used as often  as needed for congestion.  For your congestion, I have prescribed Ipratropium Bromide nasal spray 0.03% two sprays in each nostril 2-3 times a day  If you do not have a history of heart disease, hypertension, diabetes or thyroid disease, prostate/bladder issues or glaucoma, you may also use Sudafed to treat nasal congestion.  It is highly recommended that you consult with a pharmacist or your primary care physician to ensure this medication is safe for you to take.     If you have a cough, you may use cough suppressants such as Delsym and Robitussin.  If you have glaucoma or high blood pressure, you can also use Coricidin HBP.   For cough I have prescribed for you A prescription cough medication called Tessalon Perles 100 mg. You may take 1-2 capsules every 8 hours as needed for cough  If you have a sore or scratchy throat, use a saltwater gargle-  to  teaspoon of salt dissolved in a 4-ounce to 8-ounce glass of warm water.  Gargle the solution for approximately 15-30 seconds and then spit.  It is important not to swallow the solution.  You can also use throat lozenges/cough drops and Chloraseptic spray to help with throat pain or discomfort.  Warm or cold liquids can also be helpful in relieving throat pain.  For headache, pain or general discomfort, you can use Ibuprofen or Tylenol as directed.  Some authorities believe that zinc sprays or the use of Echinacea may shorten the course of your symptoms.   HOME CARE Only take medications as instructed by your medical team. Be sure to drink plenty of fluids. Water is fine as well as fruit juices, sodas and electrolyte beverages. You may want to stay away from caffeine or alcohol. If you are nauseated, try taking small sips of liquids. How do you know if you are getting enough fluid? Your urine should be a pale yellow or almost colorless. Get rest. Taking a steamy shower or using a humidifier may help nasal congestion and ease sore throat pain. You  can place a towel over your head and breathe in the steam from hot water coming from a faucet. Using a saline nasal spray works much the same way. Cough drops, hard candies and sore throat lozenges may ease your cough. Avoid close contacts especially the very young and the elderly Cover your mouth if you cough or sneeze Always remember to wash your hands.   GET HELP RIGHT AWAY IF: You develop worsening fever. If your symptoms do not improve within 10 days You develop yellow or green discharge from your nose over 3 days. You have coughing fits You develop a severe head ache or visual changes. You develop shortness of breath, difficulty breathing or start having chest pain Your symptoms persist after you have completed your treatment plan  MAKE SURE YOU  Understand these instructions. Will watch your condition. Will get help right away if you are not doing well or get worse.  Thank you for choosing an e-visit.  Your e-visit answers were reviewed by a board certified advanced clinical practitioner to complete your personal care plan. Depending upon the condition, your plan could have included both over the counter or prescription medications.  Please review your pharmacy choice. Make sure the pharmacy is open so you can pick up prescription now. If there is a problem, you may contact your provider through [employer] and have the prescription routed to another pharmacy.  Your safety is important to [place]. If you have drug allergies check your prescription carefully.   For the next 24 hours you can use MyChart to ask questions about today's visit, request a non-urgent call back, or ask for a work or school excuse. You will get an email in the next two days asking about your experience. I hope that your e-visit has been valuable and will speed your recovery.    I provided 5 minutes of non face-to-face time during this encounter for chart review, medication and order placement, as well  as and documentation.

## 2021-05-12 ENCOUNTER — Other Ambulatory Visit: Payer: Self-pay

## 2021-05-12 ENCOUNTER — Encounter: Payer: Self-pay | Admitting: Emergency Medicine

## 2021-05-12 ENCOUNTER — Emergency Department
Admission: EM | Admit: 2021-05-12 | Discharge: 2021-05-12 | Disposition: A | Discharge status: Home / Self Care | Payer: Self-pay | Attending: Emergency Medicine | Admitting: Emergency Medicine

## 2021-05-12 ENCOUNTER — Emergency Department: Payer: Self-pay

## 2021-05-12 DIAGNOSIS — J111 Influenza due to unidentified influenza virus with other respiratory manifestations: Secondary | ICD-10-CM

## 2021-05-12 DIAGNOSIS — I10 Essential (primary) hypertension: Secondary | ICD-10-CM | POA: Insufficient documentation

## 2021-05-12 DIAGNOSIS — U071 COVID-19: Secondary | ICD-10-CM | POA: Insufficient documentation

## 2021-05-12 LAB — COMPREHENSIVE METABOLIC PANEL
ALT: 44 U/L (ref 0–44)
AST: 85 U/L — ABNORMAL HIGH (ref 15–41)
Albumin: 3.3 g/dL — ABNORMAL LOW (ref 3.5–5.0)
Alkaline Phosphatase: 177 U/L — ABNORMAL HIGH (ref 38–126)
Anion gap: 6 (ref 5–15)
BUN: 7 mg/dL (ref 6–20)
CO2: 26 mmol/L (ref 22–32)
Calcium: 8.6 mg/dL — ABNORMAL LOW (ref 8.9–10.3)
Chloride: 98 mmol/L (ref 98–111)
Creatinine, Ser: 0.47 mg/dL — ABNORMAL LOW (ref 0.61–1.24)
GFR, Estimated: >60 mL/min (ref >60)
Glucose, Bld: 124 mg/dL — ABNORMAL HIGH (ref 70–99)
Potassium: 3.8 mmol/L (ref 3.5–5.1)
Sodium: 130 mmol/L — ABNORMAL LOW (ref 135–145)
Total Bilirubin: 6.8 mg/dL — ABNORMAL HIGH (ref 0.3–1.2)
Total Protein: 7.8 g/dL (ref 6.5–8.1)

## 2021-05-12 LAB — CBC
HCT: 39 % (ref 39.0–52.0)
Hemoglobin: 13.8 g/dL (ref 13.0–17.0)
MCH: 37.4 pg — ABNORMAL HIGH (ref 26.0–34.0)
MCHC: 35.4 g/dL (ref 30.0–36.0)
MCV: 105.7 fL — ABNORMAL HIGH (ref 80.0–100.0)
Platelets: 137 10*3/uL — ABNORMAL LOW (ref 150–400)
RBC: 3.69 MIL/uL — ABNORMAL LOW (ref 4.22–5.81)
RDW: 14.8 % (ref 11.5–15.5)
WBC: 5.9 10*3/uL (ref 4.0–10.5)
nRBC: 0 % (ref 0.0–0.2)

## 2021-05-12 LAB — RESP PANEL BY RT-PCR (FLU A&B, COVID) ARPGX2
Influenza A by PCR: NEGATIVE
Influenza B by PCR: NEGATIVE
SARS Coronavirus 2 by RT PCR: POSITIVE — AB

## 2021-05-12 LAB — TROPONIN I (HIGH SENSITIVITY): Troponin I (High Sensitivity): 16 ng/L (ref <18)

## 2021-05-12 MED ORDER — PAXLOVID (300/100) 20 X 150 MG & 10 X 100MG PO TBPK
3.0000 | ORAL_TABLET | Freq: Two times a day (BID) | ORAL | 0 refills | Status: AC
Start: 2021-05-12 — End: 2021-05-17

## 2021-05-12 NOTE — ED Provider Notes (Signed)
Procedures     ----------------------------------------- 8:31 AM on 05/12/2021 ----------------------------------------- EKG interpreted by me Sinus rhythm rate of 83.  Normal axis, slightly prolonged QTc of 521 ms.  Normal QRS.  Normal ST segments.  Isolated T wave inversion in lead III which is nonspecific.  No ischemic changes.  Chest x-ray viewed and interpreted by me, appears normal.  Radiology report reviewed.  Labs and EKG are all unremarkable.  Patient feels okay.  Counseled on return precautions.  Ibuprofen for pain, avoid Tylenol.  Paxlovid sent to his pharmacy.  Final diagnoses:  [id number] virus infection  Influenza-like illness        Carrie Mew, MD 05/12/21 [phone]

## 2021-05-12 NOTE — ED Provider Notes (Signed)
[facility] Provider Note    Event Date/Time   First MD Initiated Contact with Patient 05/12/21 0543     (approximate)   History   Eye Drainage, Nasal Congestion, and Generalized Body Aches   HPI  Shawn Martin is a 46 y.o. male  with h/o alcoholic cirrhosis sp TIPS, HTN, anemia who presents for evaluation of covid like symptoms. Patient is unvaccinated. Has had 3 days of body aches, chills, subjective fever, congestion, right eye drainage, green productive cough and sore throat.  Wife has been home with the same symptoms.  He took a home COVID test yesterday which was negative.  No chest pain or shortness of breath, no abdominal pain, has had some diarrhea but no vomiting.     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    Past Surgical History:  Procedure Laterality Date   IR PARACENTESIS  02/07/2019   IR PARACENTESIS  05/25/2019   IR PARACENTESIS  06/10/2019   IR PARACENTESIS  06/15/2019   IR PARACENTESIS  06/22/2019   IR PARACENTESIS  07/01/2019   IR PARACENTESIS  07/12/2019   IR PARACENTESIS  07/19/2019   IR PARACENTESIS  07/26/2019   IR RADIOLOGIST EVAL & MGMT  07/07/2019   IR RADIOLOGIST EVAL & MGMT  08/23/2019   IR RADIOLOGIST EVAL & MGMT  02/14/2020   IR RADIOLOGIST EVAL & MGMT  09/18/2020   IR THORACENTESIS ASP PLEURAL SPACE W/IMG GUIDE  07/26/2019   IR TIPS  07/26/2019   IR TRANSCATHETER BX  06/22/2019   IR US GUIDE VASC ACCESS RIGHT  06/22/2019   IR VENOGRAM HEPATIC WO HEMODYNAMIC EVALUATION  06/22/2019   NOSE SURGERY     RADIOLOGY WITH ANESTHESIA N/A 07/26/2019   Procedure: TIPS;  Surgeon: Aletta Edouard, MD;  Location: [facility];  Service: Radiology;  Laterality: N/A;     Physical Exam   Triage Vital Signs: ED Triage Vitals  Enc Vitals Group     BP 05/12/21 0329 (!) 170/98     Pulse  Rate 05/12/21 0329 97     Resp 05/12/21 0329 17     Temp 05/12/21 0329 98.5 F (36.9 C)     Temp Source 05/12/21 0329 Oral     SpO2 05/12/21 0329 94 %     Weight 05/12/21 0333 207 lb (93.9 kg)     Height 05/12/21 0333 6' (1.829 m)     Head Circumference --      Peak Flow --      Pain Score 05/12/21 0333 3     Pain Loc --      Pain Edu? --      Excl. in [place]? --     Most recent vital signs: Vitals:   05/12/21 0329  BP: (!) 170/98  Pulse: 97  Resp: 17  Temp: 98.5 F (36.9 C)  SpO2: 94%     Constitutional: Alert and oriented. Well appearing and in no apparent distress. HEENT:      Head: Normocephalic and atraumatic.         Eyes: Conjunctivae are normal. Sclera is non-icteric.       Mouth/Throat: Mucous membranes are moist.       Neck: Supple with no signs of meningismus. Cardiovascular: Regular rate and rhythm. No murmurs, gallops, or rubs. 2+  symmetrical distal pulses are present in all extremities.  Respiratory: Normal respiratory effort. Lungs are clear to auscultation bilaterally.  Gastrointestinal: Soft, non tender, and non distended with positive bowel sounds. No rebound or guarding. Genitourinary: No CVA tenderness. Musculoskeletal:  No edema, cyanosis, or erythema of extremities. Neurologic: Normal speech and language. Face is symmetric. Moving all extremities. No gross focal neurologic deficits are appreciated. Skin: Skin is warm, dry and intact. No rash noted. Psychiatric: Mood and affect are normal. Speech and behavior are normal.  ED Results / Procedures / Treatments   Labs (all labs ordered are listed, but only abnormal results are displayed) Labs Reviewed  RESP PANEL BY RT-PCR (FLU A&B, COVID) ARPGX2 - Abnormal; Notable for the following components:      Result Value   SARS Coronavirus 2 by RT PCR POSITIVE (*)    All other components within normal limits  CBC  COMPREHENSIVE METABOLIC PANEL  TROPONIN I (HIGH SENSITIVITY)      EKG  PND   RADIOLOGY [facility]   PROCEDURES:  Critical Care performed: No  Procedures    IMPRESSION / MDM / [place] / ED COURSE  I reviewed the triage vital signs and the nursing notes.  46 y.o. male  with h/o alcoholic cirrhosis sp TIPS, HTN, anemia who presents for evaluation of covid like symptoms x 3 days including chills, body aches, sore throat, productive cough, diarrhea.  Patient is otherwise well-appearing in no distress with normal work of breathing and normal sats, lungs are clear to auscultation.  Ddx: COVID versus flu versus pneumonia versus myocarditis versus pericarditis   Plan: COVID and flu swab, chest x-ray, EKG and troponin, CBC, chemistry panel   MEDICATIONS GIVEN IN ED: Medications - No data to display   ED COURSE: Patient is COVID-positive.  Discussed risk and benefits of Paxlovid and patient would like to start.  I did check all his medications and there are no known interactions or contraindications to Paxlovid.  Chest x-ray negative for pneumonia.  EKG and labs are pending. Care transferred to Dr. 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   Please note:  Patient was evaluated in Emergency Department today for the symptoms described in the history of present illness. Patient was evaluated in the context of the global COVID-19 pandemic, which necessitated consideration that the patient might be at risk for infection with the SARS-CoV-2 virus that causes COVID-19. Institutional protocols and algorithms that pertain to the evaluation of patients at risk for COVID-19 are in a state of rapid change based on information released  by regulatory bodies including the CDC and federal and state organizations. These policies and algorithms were followed during the patient's care in the ED.  Some ED evaluations and interventions may be delayed as a result of limited staffing during the pandemic.       Alfred Levins, [place], MD 05/12/21 [phone]

## 2021-05-12 NOTE — ED Notes (Signed)
Pt ambulated around the room. O2 sat maintained 98-100% on RA. Pt denies SOB.

## 2021-05-12 NOTE — ED Triage Notes (Signed)
Pt to ED from home c/o body aches, right eye drainage, congestion, green productive cough, and sore throat since Thursday.  States wife at home has been sick with similar symptoms.  Home COVID test done yesterday was negative.  Pt A&Ox4, chest rise even and unlabored, skin WNL and in NAD at this time.

## 2021-05-18 ENCOUNTER — Emergency Department: Payer: Self-pay

## 2021-05-18 ENCOUNTER — Emergency Department
Admission: EM | Admit: 2021-05-18 | Discharge: 2021-05-19 | Disposition: A | Discharge status: Home / Self Care | Payer: Self-pay | Attending: Emergency Medicine | Admitting: Emergency Medicine

## 2021-05-18 DIAGNOSIS — U071 COVID-19: Secondary | ICD-10-CM | POA: Insufficient documentation

## 2021-05-18 LAB — COMPREHENSIVE METABOLIC PANEL
ALT: 45 U/L — ABNORMAL HIGH (ref 0–44)
AST: 100 U/L — ABNORMAL HIGH (ref 15–41)
Albumin: 3.2 g/dL — ABNORMAL LOW (ref 3.5–5.0)
Alkaline Phosphatase: 180 U/L — ABNORMAL HIGH (ref 38–126)
Anion gap: 5 (ref 5–15)
BUN: 6 mg/dL (ref 6–20)
CO2: 29 mmol/L (ref 22–32)
Calcium: 8.7 mg/dL — ABNORMAL LOW (ref 8.9–10.3)
Chloride: 95 mmol/L — ABNORMAL LOW (ref 98–111)
Creatinine, Ser: 0.75 mg/dL (ref 0.61–1.24)
GFR, Estimated: >60 mL/min (ref >60)
Glucose, Bld: 118 mg/dL — ABNORMAL HIGH (ref 70–99)
Potassium: 4.2 mmol/L (ref 3.5–5.1)
Sodium: 129 mmol/L — ABNORMAL LOW (ref 135–145)
Total Bilirubin: 2.3 mg/dL — ABNORMAL HIGH (ref 0.3–1.2)
Total Protein: 8.1 g/dL (ref 6.5–8.1)

## 2021-05-18 LAB — CBC WITH DIFFERENTIAL/PLATELET
Abs Immature Granulocytes: 0.05 10*3/uL (ref 0.00–0.07)
Basophils Absolute: 0.1 10*3/uL (ref 0.0–0.1)
Basophils Relative: 1 %
Eosinophils Absolute: 0.1 10*3/uL (ref 0.0–0.5)
Eosinophils Relative: 2 %
HCT: 38.1 % — ABNORMAL LOW (ref 39.0–52.0)
Hemoglobin: 13.1 g/dL (ref 13.0–17.0)
Immature Granulocytes: 1 %
Lymphocytes Relative: 23 %
Lymphs Abs: 1.3 10*3/uL (ref 0.7–4.0)
MCH: 35.6 pg — ABNORMAL HIGH (ref 26.0–34.0)
MCHC: 34.4 g/dL (ref 30.0–36.0)
MCV: 103.5 fL — ABNORMAL HIGH (ref 80.0–100.0)
Monocytes Absolute: 0.9 10*3/uL (ref 0.1–1.0)
Monocytes Relative: 16 %
Neutro Abs: 3.3 10*3/uL (ref 1.7–7.7)
Neutrophils Relative %: 57 %
Platelets: 147 10*3/uL — ABNORMAL LOW (ref 150–400)
RBC: 3.68 MIL/uL — ABNORMAL LOW (ref 4.22–5.81)
RDW: 14.6 % (ref 11.5–15.5)
WBC: 5.7 10*3/uL (ref 4.0–10.5)
nRBC: 0 % (ref 0.0–0.2)

## 2021-05-18 MED ORDER — SODIUM CHLORIDE 0.9 % IV BOLUS
500.0000 mL | Freq: Once | INTRAVENOUS | Status: AC
  Administered 2021-05-18: 500 mL via INTRAVENOUS

## 2021-05-18 MED ORDER — POLYMYXIN B-TRIMETHOPRIM 10000-0.1 UNIT/ML-% OP SOLN
2.0000 [drp] | Freq: Four times a day (QID) | OPHTHALMIC | 0 refills | Status: DC
  Filled 2021-05-18: qty 10, 25d supply, fill #0

## 2021-05-18 MED ORDER — SODIUM CHLORIDE 0.9 % IV BOLUS: 1000.0000 mL | Freq: Once | INTRAVENOUS | Status: DC

## 2021-05-18 NOTE — ED Triage Notes (Signed)
Pt sts that he was here five days ago for covid and is not getting any better. Pt sts that he is having a lot of sinus and eye pressure.

## 2021-05-18 NOTE — ED Provider Notes (Signed)
[facility] Provider Note  Patient Contact: 10:00 PM (approximate)   History   Covid Positive   HPI  Shawn Martin is a 46 y.o. male presents to the emergency department with concern for persistent symptoms after being diagnosed with COVID-19.  Patient states that he still has nasal congestion, bilateral conjunctivitis and cough after 1 week.  He states that he took Paxlovid as directed but still feels poorly.  He denies chest pain, chest tightness or abdominal pain.  No vomiting or diarrhea.      Physical Exam   Triage Vital Signs: ED Triage Vitals  Enc Vitals Group     BP 05/18/21 2037 (!) 143/107     Pulse Rate 05/18/21 2037 97     Resp 05/18/21 2037 20     Temp 05/18/21 2037 97.8 F (36.6 C)     Temp Source 05/18/21 2037 Oral     SpO2 05/18/21 2037 96 %     Weight 05/18/21 2037 200 lb (90.7 kg)     Height 05/18/21 2037 6' (1.829 m)     Head Circumference --      Peak Flow --      Pain Score 05/18/21 2036 6     Pain Loc --      Pain Edu? --      Excl. in [place]? --     Most recent vital signs: Vitals:   05/18/21 2037  BP: (!) 143/107  Pulse: 97  Resp: 20  Temp: 97.8 F (36.6 C)  SpO2: 96%     Constitutional: Alert and oriented. Patient is lying supine. Eyes: Conjunctivae are normal. PERRL. EOMI. Head: Atraumatic. ENT:      Ears: Tympanic membranes are mildly injected with mild effusion bilaterally.       Nose: No congestion/rhinnorhea.      Mouth/Throat: Mucous membranes are moist. Posterior pharynx is mildly erythematous.  Hematological/Lymphatic/Immunilogical: No cervical lymphadenopathy.  Cardiovascular: Normal rate, regular rhythm. Normal S1 and S2.  Good peripheral circulation. Respiratory: Normal respiratory effort without tachypnea or retractions. Lungs CTAB. Good air entry to the bases with no decreased or absent breath sounds. Gastrointestinal: Bowel sounds 4 quadrants. Soft and nontender to palpation. No guarding  or rigidity. No palpable masses. No distention. No CVA tenderness. Musculoskeletal: Full range of motion to all extremities. No gross deformities appreciated. Neurologic:  Normal speech and language. No gross focal neurologic deficits are appreciated.  Skin:  Skin is warm, dry and intact. No rash noted. Psychiatric: Mood and affect are normal. Speech and behavior are normal. Patient exhibits appropriate insight and judgement.   ED Results / Procedures / Treatments   Labs (all labs ordered are listed, but only abnormal results are displayed) Labs Reviewed  CBC WITH DIFFERENTIAL/PLATELET - Abnormal; Notable for the following components:      Result Value   RBC 3.68 (*)    HCT 38.1 (*)    MCV 103.5 (*)    MCH 35.6 (*)    Platelets 147 (*)    All other components within normal limits  COMPREHENSIVE METABOLIC PANEL - Abnormal; Notable for the following components:   Sodium 129 (*)    Chloride 95 (*)    Glucose, Bld 118 (*)    Calcium 8.7 (*)    Albumin 3.2 (*)    AST 100 (*)    ALT 45 (*)    Alkaline Phosphatase 180 (*)    Total Bilirubin 2.3 (*)    All other components within  normal limits        RADIOLOGY  I personally viewed and evaluated these images as part of my medical decision making, as well as reviewing the written report by the radiologist.  ED Provider Interpretation:    PROCEDURES:  Critical Care performed: No  Procedures   MEDICATIONS ORDERED IN ED: Medications  sodium chloride 0.9 % bolus 500 mL (0 mLs Intravenous Stopped 05/18/21 2339)     IMPRESSION / MDM / [place] / ED COURSE  I reviewed the triage vital signs and the nursing notes.                              Differential diagnosis includes, but is not limited to, COVID-19, influenza, post viral pneumonia, electrolyte abnormality    Assessment and Plan:  COVID-36 46 year old male presents to the emergency department with persistent persistent nasal congestion, bilateral  conjunctivitis and generalized malaise after being diagnosed with COVID-19 1 week ago.  Patient was hypertensive at triage but vital signs were otherwise reassuring.  On physical exam, patient was alert, active and nontoxic-appearing  Basic labs were obtained and patient had mild hyponatremia on CMP and elevated AST and ALT which is consistent with patient's baseline.  CBC reassuring.  Chest x-ray shows no consolidations, opacities or infiltrates to suggest pneumonia.  We will give patient 500 cc of normal saline for hyponatremia.  Recommended continued rest and hydration at home and a work note was provided.  FINAL CLINICAL IMPRESSION(S) / ED DIAGNOSES   Final diagnoses:  [id number]     Rx / DC Orders   ED Discharge Orders          Ordered    trimethoprim-polymyxin b (POLYTRIM) ophthalmic solution  Every 6 hours        05/18/21 2351             Note:  This document was prepared using Dragon voice recognition software and may include unintentional dictation errors.   Vallarie Mare Pena, PA-C 05/18/21 2354    Arta Silence, MD 05/19/21 [phone]

## 2021-05-18 NOTE — ED Triage Notes (Signed)
First RN Note: pt to ED via POV, states dx with Covid several days ago, prescribed Paxlovid and has taken all of his medications and now feels worse than he did before.

## 2021-05-18 NOTE — ED Notes (Signed)
Patient taken to imaging. 

## 2021-05-19 MED ORDER — POLYMYXIN B-TRIMETHOPRIM 10000-0.1 UNIT/ML-% OP SOLN
2.0000 [drp] | Freq: Four times a day (QID) | OPHTHALMIC | 0 refills | Status: AC
Start: 2021-05-19 — End: 2021-05-26

## 2021-05-19 NOTE — ED Notes (Signed)
Pt gave verbal consent to DC

## 2021-05-20 ENCOUNTER — Other Ambulatory Visit (HOSPITAL_COMMUNITY): Payer: Self-pay

## 2021-08-02 ENCOUNTER — Other Ambulatory Visit: Payer: Self-pay | Admitting: Interventional Radiology

## 2021-08-02 DIAGNOSIS — K900 Celiac disease: Secondary | ICD-10-CM

## 2021-08-02 DIAGNOSIS — R188 Other ascites: Secondary | ICD-10-CM

## 2021-08-02 DIAGNOSIS — I8500 Esophageal varices without bleeding: Secondary | ICD-10-CM

## 2021-08-02 DIAGNOSIS — D509 Iron deficiency anemia, unspecified: Secondary | ICD-10-CM

## 2021-08-02 DIAGNOSIS — Z1211 Encounter for screening for malignant neoplasm of colon: Secondary | ICD-10-CM

## 2021-08-28 ENCOUNTER — Encounter: Payer: Self-pay | Admitting: Interventional Radiology

## 2021-09-30 ENCOUNTER — Other Ambulatory Visit (HOSPITAL_BASED_OUTPATIENT_CLINIC_OR_DEPARTMENT_OTHER): Payer: Self-pay

## 2021-09-30 ENCOUNTER — Other Ambulatory Visit: Payer: Self-pay | Admitting: Physician Assistant

## 2021-09-30 MED ORDER — FUROSEMIDE 20 MG PO TABS
20.0000 mg | ORAL_TABLET | Freq: Every morning | ORAL | 0 refills | Status: DC
  Filled 2021-09-30 – 2021-10-03 (×2): qty 30, 30d supply, fill #0

## 2021-10-01 ENCOUNTER — Encounter: Payer: Self-pay | Admitting: Emergency Medicine

## 2021-10-01 ENCOUNTER — Emergency Department: Payer: BC Managed Care – PPO

## 2021-10-01 ENCOUNTER — Other Ambulatory Visit: Payer: Self-pay

## 2021-10-01 DIAGNOSIS — R600 Localized edema: Secondary | ICD-10-CM | POA: Diagnosis not present

## 2021-10-01 DIAGNOSIS — R112 Nausea with vomiting, unspecified: Secondary | ICD-10-CM | POA: Insufficient documentation

## 2021-10-01 DIAGNOSIS — R42 Dizziness and giddiness: Secondary | ICD-10-CM | POA: Insufficient documentation

## 2021-10-01 DIAGNOSIS — Z760 Encounter for issue of repeat prescription: Secondary | ICD-10-CM | POA: Insufficient documentation

## 2021-10-01 DIAGNOSIS — I10 Essential (primary) hypertension: Secondary | ICD-10-CM | POA: Diagnosis not present

## 2021-10-01 DIAGNOSIS — R519 Headache, unspecified: Secondary | ICD-10-CM | POA: Diagnosis not present

## 2021-10-01 LAB — COMPREHENSIVE METABOLIC PANEL
ALT: 46 U/L — ABNORMAL HIGH (ref 0–44)
AST: 107 U/L — ABNORMAL HIGH (ref 15–41)
Albumin: 3.1 g/dL — ABNORMAL LOW (ref 3.5–5.0)
Alkaline Phosphatase: 164 U/L — ABNORMAL HIGH (ref 38–126)
Anion gap: 6 (ref 5–15)
BUN: 6 mg/dL (ref 6–20)
CO2: 29 mmol/L (ref 22–32)
Calcium: 8.4 mg/dL — ABNORMAL LOW (ref 8.9–10.3)
Chloride: 105 mmol/L (ref 98–111)
Creatinine, Ser: 0.71 mg/dL (ref 0.61–1.24)
GFR, Estimated: >60 mL/min (ref >60)
Glucose, Bld: 183 mg/dL — ABNORMAL HIGH (ref 70–99)
Potassium: 3.5 mmol/L (ref 3.5–5.1)
Sodium: 140 mmol/L (ref 135–145)
Total Bilirubin: 1.9 mg/dL — ABNORMAL HIGH (ref 0.3–1.2)
Total Protein: 7.4 g/dL (ref 6.5–8.1)

## 2021-10-01 LAB — CBC
HCT: 38.9 % — ABNORMAL LOW (ref 39.0–52.0)
Hemoglobin: 13.4 g/dL (ref 13.0–17.0)
MCH: 36 pg — ABNORMAL HIGH (ref 26.0–34.0)
MCHC: 34.4 g/dL (ref 30.0–36.0)
MCV: 104.6 fL — ABNORMAL HIGH (ref 80.0–100.0)
Platelets: 138 10*3/uL — ABNORMAL LOW (ref 150–400)
RBC: 3.72 MIL/uL — ABNORMAL LOW (ref 4.22–5.81)
RDW: 14.5 % (ref 11.5–15.5)
WBC: 4.4 10*3/uL (ref 4.0–10.5)
nRBC: 0 % (ref 0.0–0.2)

## 2021-10-01 LAB — TROPONIN I (HIGH SENSITIVITY): Troponin I (High Sensitivity): 12 ng/L (ref <18)

## 2021-10-01 NOTE — ED Notes (Signed)
[place] sent to the lab at this time.

## 2021-10-01 NOTE — ED Triage Notes (Signed)
Pt to ED via POV with c/o dizziness and HA, pt also states has been out of his spironolactone and lasix x 1 week, pt states his PCP called both in today. Pt A&O x4, NAD noted at this time.

## 2021-10-01 NOTE — ED Notes (Signed)
Pt unable to urinate at this time. Given specimen cup for when is able to provide a urine sample.

## 2021-10-02 ENCOUNTER — Other Ambulatory Visit: Payer: Self-pay

## 2021-10-02 ENCOUNTER — Other Ambulatory Visit: Payer: Self-pay | Admitting: Gastroenterology

## 2021-10-02 ENCOUNTER — Emergency Department: Payer: BC Managed Care – PPO

## 2021-10-02 ENCOUNTER — Emergency Department
Admission: EM | Admit: 2021-10-02 | Discharge: 2021-10-02 | Disposition: A | Discharge status: Home / Self Care | Payer: BC Managed Care – PPO | Attending: Emergency Medicine | Admitting: Emergency Medicine

## 2021-10-02 DIAGNOSIS — R42 Dizziness and giddiness: Secondary | ICD-10-CM

## 2021-10-02 LAB — TROPONIN I (HIGH SENSITIVITY): Troponin I (High Sensitivity): 12 ng/L (ref <18)

## 2021-10-02 MED ORDER — SPIRONOLACTONE 25 MG PO TABS
50.0000 mg | ORAL_TABLET | Freq: Once | ORAL | Status: AC
  Administered 2021-10-02: 50 mg via ORAL
  Filled 2021-10-02: qty 2

## 2021-10-02 MED ORDER — FUROSEMIDE 40 MG PO TABS
20.0000 mg | ORAL_TABLET | Freq: Once | ORAL | Status: AC
  Administered 2021-10-02: 20 mg via ORAL
  Filled 2021-10-02: qty 1

## 2021-10-02 MED ORDER — SPIRONOLACTONE 50 MG PO TABS
50.0000 mg | ORAL_TABLET | Freq: Every morning | ORAL | 0 refills | Status: DC
  Filled 2021-10-02: qty 30, 30d supply, fill #0

## 2021-10-02 NOTE — Telephone Encounter (Signed)
Last seen 08-2020: Aldactone 50 mg once daily. Patient has an upcoming appointment with [name] on 7-31. Patient has several pharmacies in his chart therefore I Called and left patient a message requesting a clarification on which pharmacy he would like the refills sent to

## 2021-10-02 NOTE — Discharge Instructions (Addendum)
Your electrolytes were normal.  Your ultrasound did not show any blood clot.  Please resume your Lasix and spironolactone.  Please follow-up with your primary care doctor.

## 2021-10-02 NOTE — ED Provider Notes (Signed)
[facility] Provider Note    Event Date/Time   First MD Initiated Contact with Patient 10/02/21 0211     (approximate)   History   Medication Refill and Dizziness   HPI  Shawn Martin is a 46 y.o. male  with pmh cirrhosis s/p TIPS who presents with dizziness, N/V/ Pt has been out of his lasix and spironolactone for about a week.  Starting today has been feeling somewhat dizzy/lightheaded and had several episodes of nonbloody nonbilious vomiting.  She denies abdominal pain denies diarrhea constipation or blood in his stool.  Patient has intermittent headache sinus congestion.  Dizziness feels like he is unsteady comes and goes is not brought on by head movement does not feel this currently.  Patient is concerned because he sometimes gets this way when his electrolytes are abnormal and because he has been out of his medication he was concerned about this. Has still been taking lactulose. Denies CP or SOB, fever or chills.  Patient called his primary doctor and the Lasix and spironolactone have not been refilled.    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    Patient Active Problem List   Diagnosis Date Noted   Acute encephalopathy 09/08/2020   Acute alcohol intoxication ([place]) 09/08/2020   Alcohol abuse with intoxication ([place]) 09/08/2020   Lactic acidosis 09/08/2020   Hypokalemia 09/08/2020   Allergic rhinitis 09/08/2020   Cirrhosis ([place]) 07/26/2019   Acute respiratory failure with hypoxia ([place]) 07/26/2019   Hyperkalemia 06/30/2019   AKI (acute kidney injury) ([place]) 06/30/2019   Hyponatremia 06/03/2019   Alcohol dependence ([place]) 06/03/2019   Symptomatic anemia 06/03/2019   Ascites due to alcoholic cirrhosis ([place]) 27/74/1287   Alcoholic cirrhosis of liver with ascites ([place]) 01/28/2019    Screening for viral disease 01/28/2019     Physical Exam  Triage Vital Signs: ED Triage Vitals  Enc Vitals Group     BP 10/01/21 2157 (!) 180/84     Pulse Rate 10/01/21 2157 94     Resp 10/01/21 2157 20     Temp 10/01/21 2157 98.2 F (36.8 C)     Temp Source 10/01/21 2157 Oral     SpO2 10/01/21 2157 97 %     Weight 10/01/21 2157 200 lb (90.7 kg)     Height 10/01/21 2157 6' (1.829 m)     Head Circumference --      Peak Flow --      Pain Score 10/01/21 2203 6     Pain Loc --      Pain Edu? --      Excl. in [place]? --     Most recent vital signs: Vitals:   10/01/21 2157 10/02/21 0017  BP: (!) 180/84 (!) 168/89  Pulse: 94 87  Resp: 20 20  Temp: 98.2 F (36.8 C) 98 F (36.7 C)  SpO2: 97% 99%     General: Awake, no distress.  CV:  Good peripheral perfusion.  Resp:  Normal effort.  Abd:  No distention.  Soft and nontender throughout Neuro:             Awake, Alert, Oriented x 3  Other:  Aox3, nml speech  PERRL, EOMI, face symmetric, nml tongue movement  5/5 strength in the BL upper and lower extremities  Sensation  grossly intact in the BL upper and lower extremities  Finger-nose-finger intact BL Asymmetric lower extremity edema left greater than right No asterixis   ED Results / Procedures / Treatments  Labs (all labs ordered are listed, but only abnormal results are displayed) Labs Reviewed  CBC - Abnormal; Notable for the following components:      Result Value   RBC 3.72 (*)    HCT 38.9 (*)    MCV 104.6 (*)    MCH 36.0 (*)    Platelets 138 (*)    All other components within normal limits  COMPREHENSIVE METABOLIC PANEL - Abnormal; Notable for the following components:   Glucose, Bld 183 (*)    Calcium 8.4 (*)    Albumin 3.1 (*)    AST 107 (*)    ALT 46 (*)    Alkaline Phosphatase 164 (*)    Total Bilirubin 1.9 (*)    All other components within normal limits  TROPONIN I (HIGH SENSITIVITY)  TROPONIN I (HIGH SENSITIVITY)     EKG  EKG reviewed and  interpreted by myself shows normal sinus rhythm, LVH, prolonged QT, inferior Q waves   RADIOLOGY I reviewed and interpreted the CXR which does not show any acute cardiopulmonary process'   PROCEDURES:  Critical Care performed: No  Procedures    MEDICATIONS ORDERED IN ED: Medications  furosemide (LASIX) tablet 20 mg (20 mg Oral Given 10/02/21 0329)  spironolactone (ALDACTONE) tablet 50 mg (50 mg Oral Given 10/02/21 0329)     IMPRESSION / MDM / ASSESSMENT AND PLAN / ED COURSE  I reviewed the triage vital signs and the nursing notes.                              Patient's presentation is most consistent with acute presentation with potential threat to life or bodily function.  Differential diagnosis includes, but is not limited to, hypokalemia, AKI, peripheral vertigo, central vertigo, hepatic encephalopathy  Patient is a 46-year-old male past medical history of cirrhosis status post TIPS presents primarily because he is concerned about potential electrolyte abnormality in the setting of being out of his Lasix and spironolactone.  He has been on these medicines for about a week.  He has some vague symptoms including dizziness headache lightheadedness nausea and vomiting.  He has no abdominal pain blood in his stool or hematemesis he has no chest pain or dyspnea.  He is mildly hypertensive vitals otherwise within normal limits.  He is alert and oriented with an intact neurologic exam no dysmetria or abnormal nystagmus.  He has no asterixis.  Clinically I am not concerned for hepatic encephalopathy as he is appropriate.  CMP is largely within normal limits potassium and sodium are normal, as is his renal function.  AST ALT and alk phos similar to prior.  Hemoglobin is at baseline troponins x2 are negative.  His EKG shows a prolonged QT about 500.  Currently patient is tolerating p.o. and denies ongoing nausea or dizziness exam is not consistent with central vertigo.  He does have asymmetric  lower extremity edema right greater than left not see any recent Dopplers although he says this has been ongoing.  We will obtain a DVT study.  He was given p.o. spironolactone and Lasix of his home dose in the ED.  If DVT study negative anticipate discharge    Patient's DVT study is negative.  He is appropriate for discharge. FINAL CLINICAL IMPRESSION(S) /   ED DIAGNOSES   Final diagnoses:  Dizziness     Rx / DC Orders   ED Discharge Orders     None        Note:  This document was prepared using Dragon voice recognition software and may include unintentional dictation errors.   McHugh, Kelly [place], MD 10/02/21 0452  

## 2021-10-02 NOTE — Telephone Encounter (Signed)
Inbound call from patient requesting medication refill for Aldavctone 50 mg. Please give patient a call back to advise.  Thank you

## 2021-10-02 NOTE — Telephone Encounter (Signed)
Refill sent to [facility]

## 2021-10-02 NOTE — Telephone Encounter (Signed)
Inbound call from patient requesting medication refill be sent to the [facility] at [facility].  Thank you  for all you do.

## 2021-10-03 ENCOUNTER — Other Ambulatory Visit: Payer: Self-pay

## 2021-10-03 ENCOUNTER — Other Ambulatory Visit (HOSPITAL_BASED_OUTPATIENT_CLINIC_OR_DEPARTMENT_OTHER): Payer: Self-pay

## 2021-10-03 ENCOUNTER — Telehealth: Payer: Self-pay

## 2021-10-03 NOTE — Telephone Encounter (Signed)
Pt called into the office yesterday. He scheduled a follow up appt with [name], PA-C on Monday, 10/28/21 at 9 am.

## 2021-10-03 NOTE — Telephone Encounter (Signed)
Shawn Martin, Shawn Raspberry, MD  Yevette Edwards, RN [place] can you please book a routine follow-up visit for this patient?  Thanks

## 2021-10-28 ENCOUNTER — Encounter: Payer: Self-pay | Admitting: Physician Assistant

## 2021-10-28 ENCOUNTER — Other Ambulatory Visit (INDEPENDENT_AMBULATORY_CARE_PROVIDER_SITE_OTHER): Payer: BC Managed Care – PPO

## 2021-10-28 ENCOUNTER — Ambulatory Visit (INDEPENDENT_AMBULATORY_CARE_PROVIDER_SITE_OTHER): Payer: BC Managed Care – PPO | Admitting: Physician Assistant

## 2021-10-28 ENCOUNTER — Other Ambulatory Visit: Payer: Self-pay

## 2021-10-28 VITALS — BP 132/80 | HR 101 | Ht 72.0 in | Wt 206.8 lb

## 2021-10-28 DIAGNOSIS — B1920 Unspecified viral hepatitis C without hepatic coma: Secondary | ICD-10-CM

## 2021-10-28 DIAGNOSIS — K7469 Other cirrhosis of liver: Secondary | ICD-10-CM

## 2021-10-28 DIAGNOSIS — Z8669 Personal history of other diseases of the nervous system and sense organs: Secondary | ICD-10-CM

## 2021-10-28 DIAGNOSIS — G4700 Insomnia, unspecified: Secondary | ICD-10-CM

## 2021-10-28 LAB — AMMONIA: Ammonia: 26 umol/L (ref 11–35)

## 2021-10-28 LAB — PROTIME-INR
INR: 1.2 ratio — ABNORMAL HIGH (ref 0.8–1.0)
Prothrombin Time: 13.4 s — ABNORMAL HIGH (ref 9.6–13.1)

## 2021-10-28 LAB — IBC + FERRITIN
Ferritin: 190.6 ng/mL (ref 22.0–322.0)
Iron: 257 ug/dL — ABNORMAL HIGH (ref 42–165)
Saturation Ratios: 87.4 % — ABNORMAL HIGH (ref 20.0–50.0)
TIBC: 294 ug/dL (ref 250.0–450.0)
Transferrin: 210 mg/dL — ABNORMAL LOW (ref 212.0–360.0)

## 2021-10-28 MED ORDER — LACTULOSE 10 GM/15ML PO SOLN
20.0000 g | Freq: Every day | ORAL | 0 refills | Status: DC
  Filled 2021-10-28: qty 900, 30d supply, fill #0

## 2021-10-28 MED ORDER — LACTULOSE 10 GM/15ML PO SOLN
30.0000 g | Freq: Every day | ORAL | 0 refills | Status: DC
  Filled 2021-10-28: qty 1350, 30d supply, fill #0

## 2021-10-28 NOTE — Patient Instructions (Addendum)
If you are age 48 or younger, your body mass index should be between 19-25. Your Body mass index is 28.05 kg/m. If this is out of the aformentioned range listed, please consider follow up with your Primary Care Provider.  ________________________________________________________  The [facility] providers would like to encourage you to use [facility] to communicate with providers for non-urgent requests or questions.  Due to long hold times on the telephone, sending your provider a message by [facility] may be a faster and more efficient way to get a response.  Please allow 48 business hours for a response.  Please remember that this is for non-urgent requests.  _______________________________________________________  Your provider has requested that you go to the basement level for lab work before leaving today. Press "B" on the elevator. The lab is located at the first door on the left as you exit the elevator.  You have been scheduled for an abdominal ultrasound at [facility] (1st floor of hospital) on 11/05/2021 at 9:00 am. Please arrive 15 minutes prior to your appointment for registration. Make certain not to have anything to eat or drink 6 hours prior to your appointment. Should you need to reschedule your appointment, please contact radiology at [phone]. This test typically takes about 30 minutes to perform.  STOP the following medications: furosemide and spironolactone  Restart Lactulose 30 mL daily  You have been scheduled to follow up with Dr. Havery Moros on September 26/2023 at 9:20 am  Thank you for entrusting me with your care and choosing [facility].  Amy [place], PA-C

## 2021-10-28 NOTE — Progress Notes (Signed)
Agree with assessment and plan as outlined.  

## 2021-10-28 NOTE — Progress Notes (Signed)
Subjective:    Patient ID: Shawn Martin, male    DOB: 1975/06/29, 46 y.o.   MRN: [id number]  HPI Shawn Martin is a 46 year old white male, established with Dr. Havery Moros, who was last seen in the office in June 2022 by myself.  He has history of decompensated EtOH induced cirrhosis.  He underwent TIPS in April 2021 for refractory ascites, hepatic hydrothorax and esophageal varices. He required a brief hospitalization in June 2022 after he had a relapse with EtOH abuse, and presented with volume overload, altered mental status and intoxication.  He was restarted on low-sodium diet and diuretics at that time.  He had Doppler US which showed the TIPS to be patent.  It was felt that he developed peripheral edema and reaccumulation of ascites due to heavy EtOH intake. M ELD at that time was 19.  When seen in follow-up he was continued on Aldactone 50 mg daily and Lasix 20 mg daily, as well as lactulose 15 cc twice daily. Patient also has history of adenomatous colon polyps with last colonoscopy November 2021 with 7 polyps removed, the largest was 8 mm and all were tubular adenomas.  He is indicated for 3-year interval follow-up. He had a recent ER visit on 10/02/2021 with complaints of dizziness nausea and vomiting which he apparently thought may have been secondary to running out of his medications over the few weeks prior to that.  Apparently he had not been taking the diuretics for couple of weeks prior to that visit.  Labs at that time showed hemoglobin 13.4/platelets 138 Sodium 129/BUN 6/creatinine 0.7 T. bili 2.3/alk phos 180/AST 100/ALT 45  Comes in today for follow-up after that ER visit.  He says he had been off of alcohol but did have a mild relapse a month or so ago.  He is most concerned today about his inability to sleep which he thinks may be work-related.  He says he cannot fall asleep or stay asleep and wakes up about every 30 minutes.  He has tried Benadryl and melatonin which both seem to  have a reverse effect on him. 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  He has developed swelling in his left knee over the past few weeks which he feels has progressed and is painful.  He is asking if we can get him referred to orthopedics to have this drained which apparently he has required in the past.  He does not have a current orthopedist, or PCP. He is also asking if paperwork can be filled out to specify that he should not work more than 10 hours/day and in a position that does not require him to stand all day.  Says he has not been having any difficulty with swelling in his abdomen or his lower extremities and did not have any reaccumulation of fluid for the weeks that he was off diuretics about a month ago.  His only swelling is in his right ankle with standing in his left knee. He also says he has a hard time taking lactulose on a regular basis because of his work schedule, as he has to run to the bathroom if he takes the lactulose as prescribed.  Review of Systems Pertinent positive and negative review of systems were noted in the above HPI section.  All other review of systems was otherwise negative.   Outpatient Encounter Medications as of 10/28/2021  Medication Sig   benzonatate (TESSALON) 100 MG capsule Take 1 capsule (100 mg total) by mouth 2 (two) times daily as needed for cough.   cetirizine (ZYRTEC) 10 MG tablet Take 10 mg by mouth daily.   Cholecalciferol (VITAMIN D3) 50 MCG (2000 UT) TABS Take 2,000 Units by mouth daily.   ferrous sulfate 325 (65 FE) MG tablet Take 1 tablet (325 mg total) by mouth daily.   folic acid (FOLVITE) 1 MG tablet Take 1 tablet (1 mg total) by mouth daily.   ipratropium (ATROVENT) 0.03 % nasal spray Place 2 sprays into both nostrils every 12 (twelve) hours.   Multiple Vitamin (MULTIVITAMIN WITH MINERALS) TABS tablet Take 1 tablet by mouth daily.   thiamine  100 MG tablet Take 1 tablet (100 mg total) by mouth daily.   zinc gluconate 50 MG tablet Take 50 mg by mouth daily.   [DISCONTINUED] furosemide (LASIX) 20 MG tablet Take 1 tablet (20 mg total) by mouth in the morning. **PLEASE CALL OFFICE TO SCHEDULE FOLLOW UP   [DISCONTINUED] lactulose (CHRONULAC) 10 GM/15ML solution Take 45 mLs (30 g total) by mouth daily.   [DISCONTINUED] spironolactone (ALDACTONE) 50 MG tablet Take 1 tablet (50 mg total) by mouth in the morning. Please keep your upcoming appointment on 7-31 for further refills. Thank you   lactulose (CHRONULAC) 10 GM/15ML solution Take 30 mLs (20 g total) by mouth daily.   No facility-administered encounter medications on file as of 10/28/2021.   Allergies  Allergen Reactions   Gluten Meal Diarrhea    bloating   Patient Active Problem List   Diagnosis Date Noted   Acute encephalopathy 09/08/2020   Acute alcohol intoxication ([place]) 09/08/2020   Alcohol abuse with intoxication ([place]) 09/08/2020   Lactic acidosis 09/08/2020   Hypokalemia 09/08/2020   Allergic rhinitis 09/08/2020   Cirrhosis ([place]) 07/26/2019   Acute respiratory failure with hypoxia ([place]) 07/26/2019   Hyperkalemia 06/30/2019   AKI (acute kidney injury) ([place]) 06/30/2019   Hyponatremia 06/03/2019   Alcohol dependence ([place]) 06/03/2019   Symptomatic anemia 06/03/2019   Ascites due to alcoholic cirrhosis ([place]) 40/98/1191   Alcoholic cirrhosis of liver with ascites ([place]) 01/28/2019   Screening for viral disease 01/28/2019   Social History   Socioeconomic History   Marital status: Married    Spouse name: Not on file   Number of children: 2   Years of education: Not on file   Highest education level: Not on file  Occupational History   Not on file  Tobacco Use   Smoking status: Former   Smokeless tobacco: Former    Types: Chew    Quit date: 03/2017  Vaping Use   Vaping Use: Never used  Substance and Sexual Activity   Alcohol use: Not Currently    Comment:  every day drinker, 6 pack per day   Drug use: Not Currently   Sexual activity: Yes    Partners: Female  Other Topics Concern   Not on file  Social History Narrative   Not on file   Social Determinants of Health   Financial Resource Strain: Not on file  Food Insecurity: Not on file  Transportation Needs: Not on file  Physical Activity: Not on file  Stress: Not on file  Social Connections: Not on file  Intimate Partner Violence: Not on file    Mr. Ghosh family history includes Healthy in his mother; Heart attack in his father; Hypertension in his father; Leukemia in his brother.  Objective:    Vitals:   10/28/21 0859  BP: 132/80  Pulse: (!) 101  SpO2: 96%    Physical Exam. Well-developed well-nourished  WM  in no acute distress.  Height, Weight,206 BMI 28.05  HEENT; nontraumatic normocephalic, EOMI, PE R LA, sclera anicteric. Oropharynx;not examined today  Neck; supple, no JVD Cardiovascular; regular rate and rhythm with S1-S2, no murmur rub or gallop Pulmonary; Clear bilaterally Abdomen; soft, nontender, nondistended, no palpable mass or hepatosplenomegaly, bowel sounds are active Rectal;not done today  Skin; benign exam, no jaundice rash or appreciable lesions Extremities; no clubbing cyanosis or edema skin warm and dry-left knee is swollen, nonerythematous, no increased warmth Neuro/Psych; alert and oriented x4, grossly nonfocal mood and affect appropriate , no asterixis, mentating well       Assessment & Plan:   Number one 46 year old white male with decompensated alcohol induced cirrhosis status post TIPS April 2021 for treatment of refractory ascites, hepatic hydrothorax, esophageal varices. He has not been seen in our office in the past year, and had run out of diuretics for several weeks about a month ago without any reaccumulation of lower extremity edema or ascites.  He has been restarted on low-dose diuretics. I am actually not certain that he  needs to be on diuretics at this point  No recent INR to calculate MELD  #2 intermittent relapses with EtOH, last was about 1 month ago #3 [place] screening, last ultrasound June 2022 #4 history of hepatic encephalopathy-no issues over the past year, he has not been compliant with lactulose due to his work schedule though takes it when he can #5 history of multiple adenomatous colon polyps-due for follow-up colonoscopy November 2024\ #6 left knee edema acute/subacute probable effusion  Plan; will stop Lasix and Aldactone for now, I have asked him to maintain low-sodium diet and to call should he have any recurrence of lower extremity edema or increased abdominal girth Check pro time/INR, AFP today Schedule for upper abdominal ultrasound for [place] surveillance Advised him to continue lactulose 30 cc p.o. daily  I do not want to start him on any prescription education for sleep given decompensated cirrhosis Patient asked for a work note for the next couple of days due to knee pain, this was given and have encouraged him to get an appointment or go in as a walk-in through [facility] in [place]/urgent care. If he obtains the appropriate paperwork from his employer regarding ability to limit standing and hours per day, we can help him fully adjust his work schedule to something more manageable than his advanced liver disease. I have also scheduled him for a follow-up with Dr. Havery Moros in 6 to 8 weeks.    Seydou Hearns Genia Harold PA-C 10/28/2021   Cc: [name], MD

## 2021-10-30 ENCOUNTER — Emergency Department (HOSPITAL_COMMUNITY): Payer: BC Managed Care – PPO

## 2021-10-30 ENCOUNTER — Emergency Department (HOSPITAL_COMMUNITY)
Admission: EM | Admit: 2021-10-30 | Discharge: 2021-10-30 | Disposition: A | Discharge status: Home / Self Care | Payer: BC Managed Care – PPO | Attending: Emergency Medicine | Admitting: Emergency Medicine

## 2021-10-30 DIAGNOSIS — W109XXA Fall (on) (from) unspecified stairs and steps, initial encounter: Secondary | ICD-10-CM | POA: Insufficient documentation

## 2021-10-30 DIAGNOSIS — F10920 Alcohol use, unspecified with intoxication, uncomplicated: Secondary | ICD-10-CM | POA: Insufficient documentation

## 2021-10-30 DIAGNOSIS — Y908 Blood alcohol level of 240 mg/100 ml or more: Secondary | ICD-10-CM | POA: Diagnosis not present

## 2021-10-30 DIAGNOSIS — W19XXXA Unspecified fall, initial encounter: Secondary | ICD-10-CM

## 2021-10-30 LAB — CBC WITH DIFFERENTIAL/PLATELET
Abs Immature Granulocytes: 0.04 10*3/uL (ref 0.00–0.07)
Basophils Absolute: 0 10*3/uL (ref 0.0–0.1)
Basophils Relative: 0 %
Eosinophils Absolute: 0 10*3/uL (ref 0.0–0.5)
Eosinophils Relative: 0 %
HCT: 44 % (ref 39.0–52.0)
Hemoglobin: 15.5 g/dL (ref 13.0–17.0)
Immature Granulocytes: 0 %
Lymphocytes Relative: 4 %
Lymphs Abs: 0.5 10*3/uL — ABNORMAL LOW (ref 0.7–4.0)
MCH: 36 pg — ABNORMAL HIGH (ref 26.0–34.0)
MCHC: 35.2 g/dL (ref 30.0–36.0)
MCV: 102.3 fL — ABNORMAL HIGH (ref 80.0–100.0)
Monocytes Absolute: 0.3 10*3/uL (ref 0.1–1.0)
Monocytes Relative: 2 %
Neutro Abs: 11.1 10*3/uL — ABNORMAL HIGH (ref 1.7–7.7)
Neutrophils Relative %: 94 %
Platelets: 176 10*3/uL (ref 150–400)
RBC: 4.3 MIL/uL (ref 4.22–5.81)
RDW: 13.9 % (ref 11.5–15.5)
WBC: 11.9 10*3/uL — ABNORMAL HIGH (ref 4.0–10.5)
nRBC: 0 % (ref 0.0–0.2)

## 2021-10-30 LAB — COMPREHENSIVE METABOLIC PANEL
ALT: 47 U/L — ABNORMAL HIGH (ref 0–44)
AST: 99 U/L — ABNORMAL HIGH (ref 15–41)
Albumin: 3.2 g/dL — ABNORMAL LOW (ref 3.5–5.0)
Alkaline Phosphatase: 174 U/L — ABNORMAL HIGH (ref 38–126)
Anion gap: 10 (ref 5–15)
BUN: 7 mg/dL (ref 6–20)
CO2: 23 mmol/L (ref 22–32)
Calcium: 8.2 mg/dL — ABNORMAL LOW (ref 8.9–10.3)
Chloride: 107 mmol/L (ref 98–111)
Creatinine, Ser: 0.66 mg/dL (ref 0.61–1.24)
GFR, Estimated: >60 mL/min (ref >60)
Glucose, Bld: 159 mg/dL — ABNORMAL HIGH (ref 70–99)
Potassium: 4.3 mmol/L (ref 3.5–5.1)
Sodium: 140 mmol/L (ref 135–145)
Total Bilirubin: 2.2 mg/dL — ABNORMAL HIGH (ref 0.3–1.2)
Total Protein: 8.4 g/dL — ABNORMAL HIGH (ref 6.5–8.1)

## 2021-10-30 LAB — AMMONIA: Ammonia: 19 umol/L (ref 9–35)

## 2021-10-30 LAB — AFP TUMOR MARKER: AFP-Tumor Marker: 3.6 ng/mL (ref <6.1)

## 2021-10-30 LAB — MAGNESIUM: Magnesium: 2.3 mg/dL (ref 1.7–2.4)

## 2021-10-30 LAB — ETHANOL: Alcohol, Ethyl (B): 314 mg/dL (ref <10)

## 2021-10-30 MED ORDER — THIAMINE HCL 100 MG/ML IJ SOLN: 100.0000 mg | Freq: Every day | INTRAMUSCULAR | Status: DC

## 2021-10-30 MED ORDER — LORAZEPAM 2 MG/ML IJ SOLN: 0.0000 mg | Freq: Two times a day (BID) | INTRAMUSCULAR | Status: DC

## 2021-10-30 MED ORDER — LORAZEPAM 2 MG/ML IJ SOLN
0.0000 mg | Freq: Four times a day (QID) | INTRAMUSCULAR | Status: DC
  Administered 2021-10-30: 1 mg via INTRAVENOUS
  Filled 2021-10-30: qty 1

## 2021-10-30 MED ORDER — LORAZEPAM 1 MG PO TABS: 0.0000 mg | ORAL_TABLET | Freq: Four times a day (QID) | ORAL | Status: DC

## 2021-10-30 MED ORDER — THIAMINE HCL 100 MG PO TABS
100.0000 mg | ORAL_TABLET | Freq: Every day | ORAL | Status: DC
  Administered 2021-10-30: 100 mg via ORAL
  Filled 2021-10-30: qty 1

## 2021-10-30 MED ORDER — LORAZEPAM 1 MG PO TABS: 0.0000 mg | ORAL_TABLET | Freq: Two times a day (BID) | ORAL | Status: DC

## 2021-10-30 NOTE — ED Provider Notes (Signed)
[facility] DEPT Provider Note   CSN: [id number] Arrival date & time: 10/30/21  1954     History  Chief Complaint  Patient presents with   Fall    Shawn Martin is a 46 y.o. male.  The history is provided by the patient and medical records.  Fall   46 year old male with history of alcohol abuse, decompensated cirrhosis, anemia, presenting to the ED after a fall.  Patient initially reports he was going to check the mail when he tripped on the steps and fell on the sidewalk and neighbors "panicked" and called his wife.  She came home found him on the ground, and a open bottle of vodka sitting on the porch.  Patient ultimately does admit he drank a bottle of wine today and then began drinking vodka because he simply could not sleep.  He states he has had chronic issues with this secondary to his cirrhosis, however GI does not currently want him on any medications for sleep as these process heavily through the liver.  He states he feels agitated all the time and is itching all night.  Denies cough, fever, chills, abdominal pain, nausea/vomiting.  He is followed closely by GI, Dr. Havery Moros.  He denies any injuries from fall today.  Home Medications Prior to Admission medications   Medication Sig Start Date End Date Taking? Authorizing Provider  benzonatate (TESSALON) 100 MG capsule Take 1 capsule (100 mg total) by mouth 2 (two) times daily as needed for cough. 05/10/21   Perlie Mayo, NP  cetirizine (ZYRTEC) 10 MG tablet Take 10 mg by mouth daily.    [provider]  Cholecalciferol (VITAMIN D3) 50 MCG (2000 UT) TABS Take 2,000 Units by mouth daily.    [provider]  ferrous sulfate 325 (65 FE) MG tablet Take 1 tablet (325 mg total) by mouth daily. 10/24/20   Esterwood, Amy S, PA-C  folic acid (FOLVITE) 1 MG tablet Take 1 tablet (1 mg total) by mouth daily. 10/15/20   Armbruster, [name], MD  ipratropium (ATROVENT) 0.03 % nasal  spray Place 2 sprays into both nostrils every 12 (twelve) hours. 05/10/21   Perlie Mayo, NP  lactulose (CHRONULAC) 10 GM/15ML solution Take 30 mLs (20 g total) by mouth daily. 10/28/21 11/27/21  Esterwood, Amy S, PA-C  Multiple Vitamin (MULTIVITAMIN WITH MINERALS) TABS tablet Take 1 tablet by mouth daily. 09/12/20   Regalado, Belkys A, MD  thiamine 100 MG tablet Take 1 tablet (100 mg total) by mouth daily. 09/12/20   Regalado, Belkys A, MD  zinc gluconate 50 MG tablet Take 50 mg by mouth daily.    [provider]      Allergies    Gluten meal    Review of Systems   Review of Systems  Musculoskeletal:        Fall  All other systems reviewed and are negative.   Physical Exam Updated Vital Signs BP (!) 139/91   Pulse 92   Temp 97.7 F (36.5 C) (Oral)   Resp 17   Ht 6' (1.829 m)   Wt 93.9 kg   SpO2 98%   BMI 28.07 kg/m   Physical Exam Vitals and nursing note reviewed.  Constitutional:      Appearance: He is well-developed.     Comments: Slightly intoxicated appearing but able to form cohesive sentences and is not slurring his words  HENT:     Head: Normocephalic and atraumatic.  Eyes:  Conjunctiva/sclera: Conjunctivae normal.     Pupils: Pupils are equal, round, and reactive to light.  Cardiovascular:     Rate and Rhythm: Normal rate and regular rhythm.     Heart sounds: Normal heart sounds.  Pulmonary:     Effort: Pulmonary effort is normal. No respiratory distress.     Breath sounds: Normal breath sounds. No rhonchi.  Abdominal:     General: Bowel sounds are normal.     Palpations: Abdomen is soft.     Tenderness: There is no abdominal tenderness. There is no rebound.  Musculoskeletal:        General: Normal range of motion.     Cervical back: Normal range of motion.  Skin:    General: Skin is warm and dry.  Neurological:     Mental Status: He is alert and oriented to person, place, and time.     Comments: AAOx3, able to give adequate history and  follow commands, moving extremities x4, no focal deficit  Psychiatric:     Comments: Denies SI/HI     ED Results / Procedures / Treatments   Labs (all labs ordered are listed, but only abnormal results are displayed) Labs Reviewed  CBC WITH DIFFERENTIAL/PLATELET - Abnormal; Notable for the following components:      Result Value   WBC 11.9 (*)    MCV 102.3 (*)    MCH 36.0 (*)    Neutro Abs 11.1 (*)    Lymphs Abs 0.5 (*)    All other components within normal limits  COMPREHENSIVE METABOLIC PANEL - Abnormal; Notable for the following components:   Glucose, Bld 159 (*)    Calcium 8.2 (*)    Total Protein 8.4 (*)    Albumin 3.2 (*)    AST 99 (*)    ALT 47 (*)    Alkaline Phosphatase 174 (*)    Total Bilirubin 2.2 (*)    All other components within normal limits  ETHANOL - Abnormal; Notable for the following components:   Alcohol, Ethyl (B) 314 (*)    All other components within normal limits  AMMONIA  MAGNESIUM  RAPID URINE DRUG SCREEN, HOSP PERFORMED    EKG None  Radiology CT Head Wo Contrast  Result Date: 10/30/2021 CLINICAL DATA:  Head and neck trauma after fall EXAM: CT HEAD WITHOUT CONTRAST CT CERVICAL SPINE WITHOUT CONTRAST TECHNIQUE: Multidetector CT imaging of the head and cervical spine was performed following the standard protocol without intravenous contrast. Multiplanar CT image reconstructions of the cervical spine were also generated. RADIATION DOSE REDUCTION: This exam was performed according to the departmental dose-optimization program which includes automated exposure control, adjustment of the mA and/or kV according to patient size and/or use of iterative reconstruction technique. COMPARISON:  CT head 09/07/2020 FINDINGS: CT HEAD FINDINGS Brain: No intracranial hemorrhage, mass effect, or evidence of acute infarct. No hydrocephalus. No extra-axial fluid collection. Vascular: No hyperdense vessel or unexpected calcification. Skull: No fracture or focal lesion.  Sinuses/Orbits: No acute finding. Paranasal sinuses and mastoid air cells are well aerated. Other: None. CT CERVICAL SPINE FINDINGS Alignment: Loss of normal cervical lordosis slightly positional. Skull base and vertebrae: No acute fracture. No primary bone lesion or focal pathologic process. Soft tissues and spinal canal: No prevertebral fluid or swelling. No visible canal hematoma. Disc levels: Advanced degenerative disc disease C5-C6 with anterior osteophytosis. Posterior disc osteophyte complex at this level causes mild effacement of the ventral thecal sac. No significant spinal canal narrowing. Uncovertebral spurring and facet arthropathy  cause moderate right and advanced left neural foraminal narrowing at C5-C6. Upper chest: Negative. Other: None. IMPRESSION: 1. No acute intracranial abnormality. 2. No acute fracture in the cervical spine. Multilevel degenerative spondylosis. Electronically Signed   By: Placido Sou M.D.   On: 10/30/2021 22:11   CT Cervical Spine Wo Contrast  Result Date: 10/30/2021 CLINICAL DATA:  Head and neck trauma after fall EXAM: CT HEAD WITHOUT CONTRAST CT CERVICAL SPINE WITHOUT CONTRAST TECHNIQUE: Multidetector CT imaging of the head and cervical spine was performed following the standard protocol without intravenous contrast. Multiplanar CT image reconstructions of the cervical spine were also generated. RADIATION DOSE REDUCTION: This exam was performed according to the departmental dose-optimization program which includes automated exposure control, adjustment of the mA and/or kV according to patient size and/or use of iterative reconstruction technique. COMPARISON:  CT head 09/07/2020 FINDINGS: CT HEAD FINDINGS Brain: No intracranial hemorrhage, mass effect, or evidence of acute infarct. No hydrocephalus. No extra-axial fluid collection. Vascular: No hyperdense vessel or unexpected calcification. Skull: No fracture or focal lesion. Sinuses/Orbits: No acute finding. Paranasal  sinuses and mastoid air cells are well aerated. Other: None. CT CERVICAL SPINE FINDINGS Alignment: Loss of normal cervical lordosis slightly positional. Skull base and vertebrae: No acute fracture. No primary bone lesion or focal pathologic process. Soft tissues and spinal canal: No prevertebral fluid or swelling. No visible canal hematoma. Disc levels: Advanced degenerative disc disease C5-C6 with anterior osteophytosis. Posterior disc osteophyte complex at this level causes mild effacement of the ventral thecal sac. No significant spinal canal narrowing. Uncovertebral spurring and facet arthropathy cause moderate right and advanced left neural foraminal narrowing at C5-C6. Upper chest: Negative. Other: None. IMPRESSION: 1. No acute intracranial abnormality. 2. No acute fracture in the cervical spine. Multilevel degenerative spondylosis. Electronically Signed   By: Placido Sou M.D.   On: 10/30/2021 22:11    Procedures Procedures    Medications Ordered in ED Medications  LORazepam (ATIVAN) injection 0-4 mg (1 mg Intravenous Given 10/30/21 2238)    Or  LORazepam (ATIVAN) tablet 0-4 mg ( Oral See Alternative 10/30/21 2238)  LORazepam (ATIVAN) injection 0-4 mg (has no administration in time range)    Or  LORazepam (ATIVAN) tablet 0-4 mg (has no administration in time range)  thiamine (VITAMIN B1) tablet 100 mg (100 mg Oral Given 10/30/21 2236)    Or  thiamine (VITAMIN B1) injection 100 mg ( Intravenous See Alternative 10/30/21 2236)    ED Course/ Medical Decision Making/ A&P                           Medical Decision Making Amount and/or Complexity of Data Reviewed Labs: ordered.   46 y.o. M here after a fall.  Appears he had been drinking wine and vodka earlier today.  He has known history of alcohol dependence and decompensated cirrhosis, currently followed by GI.  He denies any injuries from the fall.  He appears slightly intoxicated but is awake, alert, fully oriented on my exam.  He is  able to give adequate history, answer questions, follow commands.  He is moving all of his extremities well, no focal deficits.  He is ambulatory in the ED.  Labs as above--LFTs appear around his baseline along with alk phos.  Ethanol is 314, however he is highly functional at this level.  Ammonia is normal.  CT head and neck are negative for any acute findings.  Patient is tolerating p.o.  vitals are  stable.  Admits to drinking heavily today as he has not been able to sleep due to agitation and itching over the past several days.  Denies SI/HI.  Had recent follow-up with GI, hesitant to start him on any prescription sleep medications due to his decompensated liver disease.  Will defer any decisions about this back to GI as this will need to be monitored closely.  He was encouraged to refrain from drinking alcohol as this will certainly worsen his liver disease.  He may return here for any new or acute changes.  Final Clinical Impression(s) / ED Diagnoses Final diagnoses:  Fall, initial encounter  Alcoholic intoxication without complication [facility])    Rx / DC Orders ED Discharge Orders     None         Larene Pickett, PA-C 10/30/21 2317    Drenda Freeze, MD 10/30/21 [phone]

## 2021-10-30 NOTE — Discharge Instructions (Signed)
Refrain from drinking alcohol, this is only going to make your liver disease worse. Follow-up with Dr. Havery Moros-- you can talk to him about help with sleep or your primary care doctor. Return here for new concerns.

## 2021-10-30 NOTE — ED Triage Notes (Signed)
Pt arrives after having a fall on his deck. Pt denies pain or hitting his head. Per pt, he was taken off of his diuretics Monday and has started to "fell weird." Per visitor, there was a bottle of alcohol on the deck where he fell.

## 2021-10-30 NOTE — ED Provider Triage Note (Signed)
Emergency Medicine Provider Triage Evaluation Note  Shawn Martin , a 46 y.o. male  was evaluated in triage.  History supplied by patient's wife.  She reportedly received a call from the landlord saying that he had fallen outside and was laying down.  Neighbors noted him on the ground and called the landlord.  Wife reports that she found a large bottle of vodka and starting a on the porch.  He has a history of alcohol abuse but had previously stopped drinking.  Has decompensated liver disease as well.  He says that he just wants something to make him sleep because he has not slept in 3 days  Review of Systems  Positive: Tired Negative:   Physical Exam  BP (!) 176/91 (BP Location: Left Arm)   Pulse 95   Temp 97.7 F (36.5 C) (Oral)   Resp 17   Ht 6' (1.829 m)   Wt 93.9 kg   SpO2 100%   BMI 28.07 kg/m  Gen:   Awake, no distress   Resp:  Normal effort  MSK:   Moves extremities without difficulty  Other:  Slurring his words.  Appears intoxicated  Medical Decision Making  Medically screening exam initiated at 8:50 PM.  Appropriate orders placed.  Shawn Martin was informed that the remainder of the evaluation will be completed by another provider, this initial triage assessment does not replace that evaluation, and the importance of remaining in the ED until their evaluation is complete.   No history of withdrawals    Rhae Hammock, PA-C 10/30/21 2052

## 2021-11-04 ENCOUNTER — Other Ambulatory Visit: Payer: Self-pay

## 2021-11-05 ENCOUNTER — Ambulatory Visit
Admission: RE | Admit: 2021-11-05 | Discharge: 2021-11-05 | Disposition: A | Discharge status: Home / Self Care | Payer: BC Managed Care – PPO | Source: Ambulatory Visit | Attending: Physician Assistant | Admitting: Physician Assistant

## 2021-11-05 DIAGNOSIS — Z8669 Personal history of other diseases of the nervous system and sense organs: Secondary | ICD-10-CM | POA: Diagnosis present

## 2021-11-05 DIAGNOSIS — K7469 Other cirrhosis of liver: Secondary | ICD-10-CM | POA: Insufficient documentation

## 2021-11-05 DIAGNOSIS — B1920 Unspecified viral hepatitis C without hepatic coma: Secondary | ICD-10-CM | POA: Insufficient documentation

## 2021-11-12 ENCOUNTER — Other Ambulatory Visit: Payer: Self-pay

## 2021-11-12 ENCOUNTER — Emergency Department
Admission: EM | Admit: 2021-11-12 | Discharge: 2021-11-12 | Disposition: A | Discharge status: Home / Self Care | Payer: BC Managed Care – PPO | Attending: Emergency Medicine | Admitting: Emergency Medicine

## 2021-11-12 DIAGNOSIS — J00 Acute nasopharyngitis [common cold]: Secondary | ICD-10-CM | POA: Diagnosis not present

## 2021-11-12 DIAGNOSIS — F1010 Alcohol abuse, uncomplicated: Secondary | ICD-10-CM | POA: Diagnosis not present

## 2021-11-12 DIAGNOSIS — R222 Localized swelling, mass and lump, trunk: Secondary | ICD-10-CM | POA: Insufficient documentation

## 2021-11-12 DIAGNOSIS — Y902 Blood alcohol level of 40-59 mg/100 ml: Secondary | ICD-10-CM | POA: Insufficient documentation

## 2021-11-12 DIAGNOSIS — Z20822 Contact with and (suspected) exposure to covid-19: Secondary | ICD-10-CM | POA: Diagnosis not present

## 2021-11-12 DIAGNOSIS — R1900 Intra-abdominal and pelvic swelling, mass and lump, unspecified site: Secondary | ICD-10-CM

## 2021-11-12 LAB — SARS CORONAVIRUS 2 BY RT PCR: SARS Coronavirus 2 by RT PCR: NEGATIVE

## 2021-11-12 LAB — COMPREHENSIVE METABOLIC PANEL
ALT: 58 U/L — ABNORMAL HIGH (ref 0–44)
AST: 97 U/L — ABNORMAL HIGH (ref 15–41)
Albumin: 2.9 g/dL — ABNORMAL LOW (ref 3.5–5.0)
Alkaline Phosphatase: 165 U/L — ABNORMAL HIGH (ref 38–126)
Anion gap: 10 (ref 5–15)
BUN: 7 mg/dL (ref 6–20)
CO2: 22 mmol/L (ref 22–32)
Calcium: 8.2 mg/dL — ABNORMAL LOW (ref 8.9–10.3)
Chloride: 99 mmol/L (ref 98–111)
Creatinine, Ser: 0.47 mg/dL — ABNORMAL LOW (ref 0.61–1.24)
GFR, Estimated: >60 mL/min (ref >60)
Glucose, Bld: 116 mg/dL — ABNORMAL HIGH (ref 70–99)
Potassium: 3.6 mmol/L (ref 3.5–5.1)
Sodium: 131 mmol/L — ABNORMAL LOW (ref 135–145)
Total Bilirubin: 3.8 mg/dL — ABNORMAL HIGH (ref 0.3–1.2)
Total Protein: 6.7 g/dL (ref 6.5–8.1)

## 2021-11-12 LAB — CBC WITH DIFFERENTIAL/PLATELET
Abs Immature Granulocytes: 0.05 10*3/uL (ref 0.00–0.07)
Basophils Absolute: 0.1 10*3/uL (ref 0.0–0.1)
Basophils Relative: 1 %
Eosinophils Absolute: 0.2 10*3/uL (ref 0.0–0.5)
Eosinophils Relative: 2 %
HCT: 40.8 % (ref 39.0–52.0)
Hemoglobin: 14.3 g/dL (ref 13.0–17.0)
Immature Granulocytes: 1 %
Lymphocytes Relative: 15 %
Lymphs Abs: 1 10*3/uL (ref 0.7–4.0)
MCH: 35.5 pg — ABNORMAL HIGH (ref 26.0–34.0)
MCHC: 35 g/dL (ref 30.0–36.0)
MCV: 101.2 fL — ABNORMAL HIGH (ref 80.0–100.0)
Monocytes Absolute: 0.7 10*3/uL (ref 0.1–1.0)
Monocytes Relative: 10 %
Neutro Abs: 4.7 10*3/uL (ref 1.7–7.7)
Neutrophils Relative %: 71 %
Platelets: 83 10*3/uL — ABNORMAL LOW (ref 150–400)
RBC: 4.03 MIL/uL — ABNORMAL LOW (ref 4.22–5.81)
RDW: 15.5 % (ref 11.5–15.5)
Smear Review: NORMAL
WBC: 6.7 10*3/uL (ref 4.0–10.5)
nRBC: 0 % (ref 0.0–0.2)

## 2021-11-12 LAB — URINALYSIS, ROUTINE W REFLEX MICROSCOPIC
Bacteria, UA: NONE SEEN
Glucose, UA: NEGATIVE mg/dL
Hgb urine dipstick: NEGATIVE
Ketones, ur: 5 mg/dL — AB
Leukocytes,Ua: NEGATIVE
Nitrite: NEGATIVE
Protein, ur: 100 mg/dL — AB
Specific Gravity, Urine: 1.032 — ABNORMAL HIGH (ref 1.005–1.030)
pH: 5 (ref 5.0–8.0)

## 2021-11-12 LAB — TROPONIN I (HIGH SENSITIVITY): Troponin I (High Sensitivity): 10 ng/L (ref <18)

## 2021-11-12 LAB — AMMONIA: Ammonia: 36 umol/L — ABNORMAL HIGH (ref 9–35)

## 2021-11-12 LAB — ETHANOL: Alcohol, Ethyl (B): 53 mg/dL — ABNORMAL HIGH (ref <10)

## 2021-11-12 LAB — LIPASE, BLOOD: Lipase: 38 U/L (ref 11–51)

## 2021-11-12 NOTE — ED Provider Notes (Signed)
[facility] Provider Note    Event Date/Time   First MD Initiated Contact with Patient 11/12/21 0715     (approximate)   History   Abdominal Pain   HPI  Shawn Martin is a 46 y.o. male  with history of cirrhosis, s/p TIPS who presents to the emergency department today because of concern for abdominal swelling. Patient has not had abdominal swelling since the TIPS. Was taken off of his lasix and spironolactone 2 weeks ago by physician. The swelling started yesterday. Is accompanied by some discomfort. Denies any fevers. Has also had cold and congestion type symptoms.     Physical Exam   Triage Vital Signs: ED Triage Vitals [11/12/21 0241]  Enc Vitals Group     BP 138/89     Pulse Rate (!) 115     Resp 18     Temp 98.7 F (37.1 C)     Temp Source Oral     SpO2 94 %     Weight      Height      Head Circumference      Peak Flow      Pain Score      Pain Loc      Pain Edu?      Excl. in [place]?     Most recent vital signs: Vitals:   11/12/21 0241 11/12/21 0630  BP: 138/89 (!) 158/89  Pulse: (!) 115 96  Resp: 18 20  Temp: 98.7 F (37.1 C) 98.6 F (37 C)  SpO2: 94% 91%    General: Awake, alert, oriented. CV:  Good peripheral perfusion. Regular rate and rhythm. Resp:  Normal effort. Lungs clear. Abd:  Distended. Soft. Non tender.   ED Results / Procedures / Treatments   Labs (all labs ordered are listed, but only abnormal results are displayed) Labs Reviewed  CBC WITH DIFFERENTIAL/PLATELET - Abnormal; Notable for the following components:      Result Value   RBC 4.03 (*)    MCV 101.2 (*)    MCH 35.5 (*)    Platelets 83 (*)    All other components within normal limits  COMPREHENSIVE METABOLIC PANEL - Abnormal; Notable for the following components:   Sodium 131 (*)    Glucose, Bld 116 (*)    Creatinine, Ser 0.47 (*)    Calcium 8.2 (*)    Albumin 2.9 (*)    AST 97 (*)    ALT 58 (*)    Alkaline Phosphatase 165 (*)     Total Bilirubin 3.8 (*)    All other components within normal limits  AMMONIA - Abnormal; Notable for the following components:   Ammonia 36 (*)    All other components within normal limits  URINALYSIS, ROUTINE W REFLEX MICROSCOPIC - Abnormal; Notable for the following components:   Color, Urine AMBER (*)    APPearance HAZY (*)    Specific Gravity, Urine 1.032 (*)    Bilirubin Urine MODERATE (*)    Ketones, ur 5 (*)    Protein, ur 100 (*)    All other components within normal limits  ETHANOL - Abnormal; Notable for the following components:   Alcohol, Ethyl (B) 53 (*)    All other components within normal limits  SARS CORONAVIRUS 2 BY RT PCR  LIPASE, BLOOD  TROPONIN I (HIGH SENSITIVITY)  TROPONIN I (HIGH SENSITIVITY)     EKG  I, Nance Pear, attending physician, personally viewed and interpreted this EKG  EKG Time: [phone]  Rate: 92 Rhythm: normal sinus rhythm Axis: normal Intervals: qtc 484 QRS: narrow, q waves II, III, aVF ST changes: no st elevation Impression: abnormal ekg   RADIOLOGY None  PROCEDURES:  Critical Care performed: No  Procedures   MEDICATIONS ORDERED IN ED: Medications - No data to display   IMPRESSION / MDM / [place] / ED COURSE  I reviewed the triage vital signs and the nursing notes.                              Differential diagnosis includes, but is not limited to, SBP, ascites.   Patient's presentation is most consistent with acute presentation with potential threat to life or bodily function.  Patient presents to the ER because of concern for abdominal swelling. On exam patient without any tenderness. No leukocytosis or fever. At this time I have low concern for SBP. Do wonder if swelling is partly do to discontinuation of medication. At this time given low concern for SBP do not feel benefit of diagnostic paracentesis is worth the risk of bleed or introducing infection. Do think patient would benefit from close follow  up with his liver doctor. Discussed this with the patient.    FINAL CLINICAL IMPRESSION(S) / ED DIAGNOSES   Final diagnoses:  Abdominal swelling     Note:  This document was prepared using Dragon voice recognition software and may include unintentional dictation errors.    Nance Pear, MD 11/12/21 [phone]

## 2021-11-12 NOTE — ED Notes (Signed)
Pt ambulatory to waiting room. Pt verbalized understanding of discharge instructions.   

## 2021-11-12 NOTE — ED Triage Notes (Signed)
Pt presents to ER with c/o upper abd pain and abd swelling that started yesterday afternoon.  Pt states he has hx of cirrhosis and had TIPS procedure done in 2021.  Pt denies n/v/d.  Pt endorses some dizziness.  States last BM was prior to coming here.  Pt is currently A&O x4 at this time in NAD in triage.

## 2021-11-12 NOTE — Discharge Instructions (Signed)
Please seek medical attention for any high fevers, chest pain, shortness of breath, change in behavior, persistent vomiting, bloody stool or any other new or concerning symptoms.  

## 2021-11-19 ENCOUNTER — Other Ambulatory Visit: Payer: Self-pay | Admitting: Gastroenterology

## 2021-11-19 ENCOUNTER — Telehealth: Payer: Self-pay | Admitting: Physician Assistant

## 2021-11-19 DIAGNOSIS — K7031 Alcoholic cirrhosis of liver with ascites: Secondary | ICD-10-CM

## 2021-11-19 MED ORDER — SPIRONOLACTONE 100 MG PO TABS: 100.0000 mg | ORAL_TABLET | Freq: Every day | ORAL | 0 refills | Status: DC

## 2021-11-19 MED ORDER — FUROSEMIDE 40 MG PO TABS: 40.0000 mg | ORAL_TABLET | Freq: Every day | ORAL | 0 refills | Status: DC

## 2021-11-19 NOTE — Telephone Encounter (Signed)
Please review for [facility]. She is off for a couple of days. This patient has cirrhosis. He was recently taken off his diuretics. He has sent a picture and messages through My Chart complaining of fluid retention in the abdomen. He is very uncomfortable. Endorses compliance with diet. He has also sent a picture through My Chart showing the hives he has developed.

## 2021-11-19 NOTE — Telephone Encounter (Signed)
Beth if the patient has been having recurrent edema we can add back lasix 77m / day and aldactone 1023m/ day and repeat a BMET in one week. He will also need an [place] doppler to assess for TIPS patency, if you can order that to radiology. He should see his primary care for evaluation of hives, I am not sure what is causing that. He has had relapse of alcohol use which is making his liver disease worse and he absolutely needs to abstain from drinking alcohol at this time.  I am scheduled to see him in the office on 8/31 otherwise. If he is getting worse despite diuretics he needs to contact [place]

## 2021-11-19 NOTE — Telephone Encounter (Signed)
Responded through My Chart. I am in clinic this afternoon.

## 2021-11-19 NOTE — Telephone Encounter (Signed)
Received a call from patient requesting he be put back on his diuretics.  He has also sent a My Chart message this morning to that effect as well.  Please call patient and advise.  Thank you.

## 2021-11-19 NOTE — Progress Notes (Signed)
See telephone note for details

## 2021-11-20 NOTE — Telephone Encounter (Signed)
Spoke with the patient. Weight today is 210 lbs. He has taken the diuretics. Understands the instructions and the recommendations. He questions the u/s to check TIPS patency. Advises me he has an u/s on 11/05/21 and is scheduled for follow up with the TIPs clinic 12/24/21. He does agree to go if it is needed. Asks me to "just make sure." Labs 11/27/21. Appointment with Dr Havery Moros 11/28/21.

## 2021-11-20 NOTE — Telephone Encounter (Signed)
Called the patient to discuss. No answer. Left the information on the voicemail with my name to call back if he has further concerns or questions.

## 2021-11-20 NOTE — Telephone Encounter (Signed)
Thanks [employer].  I actually called Dr. Jimmye Norman of radiology to review his last ultrasound and he states that TIPS is patent on that study and thus, to clarify, we do NOT need to do another ultrasound and can cancel that order.  We can continue with the diuretics, he really must follow-up for labs next week and make sure he avoids all alcohol.  I will see him next week.  Thank you

## 2021-11-20 NOTE — Telephone Encounter (Signed)
Called the patient. No answer. Left him a message asking he call me back or communicate with me through the patient portal of My Chart today.

## 2021-11-22 ENCOUNTER — Emergency Department: Payer: BC Managed Care – PPO

## 2021-11-22 ENCOUNTER — Emergency Department
Admission: EM | Admit: 2021-11-22 | Discharge: 2021-11-22 | Disposition: A | Discharge status: Home / Self Care | Payer: BC Managed Care – PPO | Attending: Emergency Medicine | Admitting: Emergency Medicine

## 2021-11-22 ENCOUNTER — Other Ambulatory Visit: Payer: Self-pay

## 2021-11-22 ENCOUNTER — Encounter: Payer: Self-pay | Admitting: Emergency Medicine

## 2021-11-22 DIAGNOSIS — K7031 Alcoholic cirrhosis of liver with ascites: Secondary | ICD-10-CM | POA: Diagnosis not present

## 2021-11-22 DIAGNOSIS — Z20822 Contact with and (suspected) exposure to covid-19: Secondary | ICD-10-CM | POA: Insufficient documentation

## 2021-11-22 DIAGNOSIS — R140 Abdominal distension (gaseous): Secondary | ICD-10-CM | POA: Diagnosis present

## 2021-11-22 LAB — CBC WITH DIFFERENTIAL/PLATELET
Abs Immature Granulocytes: 0.01 10*3/uL (ref 0.00–0.07)
Basophils Absolute: 0.1 10*3/uL (ref 0.0–0.1)
Basophils Relative: 2 %
Eosinophils Absolute: 0.1 10*3/uL (ref 0.0–0.5)
Eosinophils Relative: 2 %
HCT: 38.8 % — ABNORMAL LOW (ref 39.0–52.0)
Hemoglobin: 13.5 g/dL (ref 13.0–17.0)
Immature Granulocytes: 0 %
Lymphocytes Relative: 49 %
Lymphs Abs: 2.1 10*3/uL (ref 0.7–4.0)
MCH: 34.8 pg — ABNORMAL HIGH (ref 26.0–34.0)
MCHC: 34.8 g/dL (ref 30.0–36.0)
MCV: 100 fL (ref 80.0–100.0)
Monocytes Absolute: 0.5 10*3/uL (ref 0.1–1.0)
Monocytes Relative: 12 %
Neutro Abs: 1.5 10*3/uL — ABNORMAL LOW (ref 1.7–7.7)
Neutrophils Relative %: 35 %
Platelets: 162 10*3/uL (ref 150–400)
RBC: 3.88 MIL/uL — ABNORMAL LOW (ref 4.22–5.81)
RDW: 15.2 % (ref 11.5–15.5)
WBC: 4.3 10*3/uL (ref 4.0–10.5)
nRBC: 0 % (ref 0.0–0.2)

## 2021-11-22 LAB — COMPREHENSIVE METABOLIC PANEL
ALT: 60 U/L — ABNORMAL HIGH (ref 0–44)
AST: 131 U/L — ABNORMAL HIGH (ref 15–41)
Albumin: 2.6 g/dL — ABNORMAL LOW (ref 3.5–5.0)
Alkaline Phosphatase: 152 U/L — ABNORMAL HIGH (ref 38–126)
Anion gap: 9 (ref 5–15)
BUN: <5 mg/dL — ABNORMAL LOW (ref 6–20)
CO2: 27 mmol/L (ref 22–32)
Calcium: 8 mg/dL — ABNORMAL LOW (ref 8.9–10.3)
Chloride: 99 mmol/L (ref 98–111)
Creatinine, Ser: 0.65 mg/dL (ref 0.61–1.24)
GFR, Estimated: >60 mL/min (ref >60)
Glucose, Bld: 159 mg/dL — ABNORMAL HIGH (ref 70–99)
Potassium: 3.4 mmol/L — ABNORMAL LOW (ref 3.5–5.1)
Sodium: 135 mmol/L (ref 135–145)
Total Bilirubin: 3.3 mg/dL — ABNORMAL HIGH (ref 0.3–1.2)
Total Protein: 7.9 g/dL (ref 6.5–8.1)

## 2021-11-22 LAB — LIPASE, BLOOD: Lipase: 36 U/L (ref 11–51)

## 2021-11-22 LAB — SARS CORONAVIRUS 2 BY RT PCR: SARS Coronavirus 2 by RT PCR: NEGATIVE

## 2021-11-22 LAB — TROPONIN I (HIGH SENSITIVITY): Troponin I (High Sensitivity): 14 ng/L (ref <18)

## 2021-11-22 LAB — BRAIN NATRIURETIC PEPTIDE: B Natriuretic Peptide: 8.2 pg/mL (ref 0.0–100.0)

## 2021-11-22 NOTE — ED Triage Notes (Signed)
Pt to ED via POV, pt states that he is having shortness of breath. Pt states that this is worse when laying down. Pt has hx/o cirrhosis and states that he has swelling in his abdomen. Pt states that his doctor had taken him off his Lasix but now he is back on it but it has not had time to work. Pt is able to speak in complete sentences but is visibly short of breath.

## 2021-11-22 NOTE — ED Provider Notes (Signed)
[facility] Provider Note    Event Date/Time   First MD Initiated Contact with Patient 11/22/21 [phone]     (approximate)   History   Chief Complaint: Shortness of Breath   HPI  Shawn Martin is a 46 y.o. male with a history of alcoholic cirrhosis with ascites, status post TIPS procedure in 2021 who comes ED complaining of increased abdominal distention and shortness of breath, gradually worsening for the past few weeks.  He had previously been on Lasix and spironolactone, but this was discontinued by his doctor about 3 weeks ago, and since then his symptoms have been worsening.  He restarted the diuretics about 3 days ago but has not had improvement during this time.  Shortness of breath is worse with lying down.  No chest pain or other pain complaints.  No fever.  No black or bloody stool or hematemesis.  No shortness of breath     Physical Exam   Triage Vital Signs: ED Triage Vitals  Enc Vitals Group     BP 11/22/21 0806 (!) 166/102     Pulse Rate 11/22/21 0806 (!) 115     Resp 11/22/21 0806 (!) 22     Temp 11/22/21 0806 98.1 F (36.7 C)     Temp Source 11/22/21 0806 Oral     SpO2 11/22/21 0806 97 %     Weight 11/22/21 0807 207 lb (93.9 kg)     Height 11/22/21 0807 6' (1.829 m)     Head Circumference --      Peak Flow --      Pain Score 11/22/21 0806 0     Pain Loc --      Pain Edu? --      Excl. in [place]? --     Most recent vital signs: Vitals:   11/22/21 1217 11/22/21 1311  BP: (!) 148/77 (!) 144/71  Pulse: 100 99  Resp:  17  Temp: 98.1 F (36.7 C) 98.2 F (36.8 C)  SpO2: 96% 96%    General: Awake, no distress.  CV:  Good peripheral perfusion.  Regular rate and rhythm.  Symmetric distal pulses Resp:  Normal effort.  Clear to auscultation bilaterally Abd:  Mild distention.  Soft, nontender. Other:  No lower extremity edema   ED Results / Procedures / Treatments   Labs (all labs ordered are listed, but only abnormal results  are displayed) Labs Reviewed  COMPREHENSIVE METABOLIC PANEL - Abnormal; Notable for the following components:      Result Value   Potassium 3.4 (*)    Glucose, Bld 159 (*)    BUN <5 (*)    Calcium 8.0 (*)    Albumin 2.6 (*)    AST 131 (*)    ALT 60 (*)    Alkaline Phosphatase 152 (*)    Total Bilirubin 3.3 (*)    All other components within normal limits  CBC WITH DIFFERENTIAL/PLATELET - Abnormal; Notable for the following components:   RBC 3.88 (*)    HCT 38.8 (*)    MCH 34.8 (*)    Neutro Abs 1.5 (*)    All other components within normal limits  SARS CORONAVIRUS 2 BY RT PCR  LIPASE, BLOOD  BRAIN NATRIURETIC PEPTIDE  PROTIME-INR  TROPONIN I (HIGH SENSITIVITY)  TROPONIN I (HIGH SENSITIVITY)     EKG    RADIOLOGY Chest x-ray interpreted by me, appears normal.  Radiology report reviewed.   PROCEDURES:  Procedures   MEDICATIONS ORDERED IN  ED: Medications - No data to display   IMPRESSION / MDM / [place] / ED COURSE  I reviewed the triage vital signs and the nursing notes.                              Differential diagnosis includes, but is not limited to, alcoholic cirrhosis with ascites, renal failure, electrolyte abnormality, worsening liver failure  Patient's presentation is most consistent with exacerbation of chronic illness.  Patient with cirrhosis presents with increased ascites, symptomatic.  Vital signs are unremarkable.  Chemistry panel is at chronic baseline.  CBC is normal, other labs are reassuring.  COVID-negative.  Patient had paracentesis by IR from the emergency department, who returns feeling much better.  He reports he is now asymptomatic.  Vital signs unremarkable, stable for discharge.       FINAL CLINICAL IMPRESSION(S) / ED DIAGNOSES   Final diagnoses:  Alcoholic cirrhosis of liver with ascites ([place])     Rx / DC Orders   ED Discharge Orders     None        Note:  This document was prepared using Dragon voice  recognition software and may include unintentional dictation errors.   Carrie Mew, MD 11/22/21 [phone]

## 2021-11-22 NOTE — Procedures (Signed)
Ultrasound-guided therapeutic paracentesis performed yielding 1.8 liters of straw colored fluid.  Fluid is being held in [place] pending ED MD evaluation. Should fluid need to be sent to the lab for analysis please contact us.Marland Kitchen No immediate complications. EBL is none.

## 2021-11-28 ENCOUNTER — Ambulatory Visit: Payer: BC Managed Care – PPO | Admitting: Gastroenterology

## 2021-12-24 ENCOUNTER — Ambulatory Visit: Payer: BC Managed Care – PPO | Admitting: Gastroenterology

## 2022-01-13 ENCOUNTER — Telehealth: Payer: Self-pay | Admitting: Physician Assistant

## 2022-01-13 NOTE — Telephone Encounter (Signed)
Inbound call from patient sating he has FMLA paperwork that he will bring by the office to be filled out. Please advise.

## 2022-01-13 NOTE — Telephone Encounter (Signed)
Spoke with the patient. Last seen in July for cirrhosis of the liver. He needs to get FMLA for his employer in order to go to his medical appointments without penalty of losing his job. Admits he has missed recent appointments. Instructed to bring the papers to the 3rd floor. There is a process for the FMLA forms. Moved his appointment to 01/31/22 with Dr Havery Moros.

## 2022-01-16 ENCOUNTER — Other Ambulatory Visit (HOSPITAL_COMMUNITY): Payer: Self-pay

## 2022-01-16 ENCOUNTER — Other Ambulatory Visit: Payer: Self-pay

## 2022-01-16 ENCOUNTER — Telehealth: Payer: Self-pay | Admitting: Physician Assistant

## 2022-01-16 DIAGNOSIS — K7031 Alcoholic cirrhosis of liver with ascites: Secondary | ICD-10-CM

## 2022-01-16 MED ORDER — FUROSEMIDE 40 MG PO TABS
40.0000 mg | ORAL_TABLET | Freq: Every day | ORAL | 0 refills | Status: DC
  Filled 2022-01-16: qty 30, 30d supply, fill #0

## 2022-01-16 MED ORDER — SPIRONOLACTONE 100 MG PO TABS
100.0000 mg | ORAL_TABLET | Freq: Every day | ORAL | 0 refills | Status: DC
  Filled 2022-01-16: qty 30, 30d supply, fill #0

## 2022-01-16 NOTE — Telephone Encounter (Signed)
Per Dr Havery Moros. Patient will come for labs tomorrow morning. The spouse of the patient states she will bring the patient to the office for the labs. The medications will be refilled.

## 2022-01-16 NOTE — Telephone Encounter (Signed)
Patient's wife came into the office, stated patient is completely out of both Lasix and Aldactone medication, requesting refills.

## 2022-01-16 NOTE — Telephone Encounter (Signed)
Spoke with the spouse of the patient. Patient has been out of Lasix 1 day. He has been out of Aldactone "a couple of days." We did speak of the need to get his labs. She says he can come for labs tomorrow. He has a follow up appointment with you on 01/31/22. We briefly discussed the canceled and no show appointments. She said he is trying not to miss work. He is requesting FMLA to protect his job.  Is it okay to fill the diuretics?  He has not had labs since August. Do you want those updated as well?

## 2022-01-16 NOTE — Telephone Encounter (Signed)
Thanks [employer]. Yes he should come to the lab tomorrow to check electrolytes and can refill his diuretics. Can adjust accordingly based on his labs. Thanks

## 2022-01-17 ENCOUNTER — Other Ambulatory Visit: Payer: Self-pay

## 2022-01-17 ENCOUNTER — Other Ambulatory Visit (HOSPITAL_COMMUNITY): Payer: Self-pay

## 2022-01-17 ENCOUNTER — Other Ambulatory Visit (INDEPENDENT_AMBULATORY_CARE_PROVIDER_SITE_OTHER): Payer: BC Managed Care – PPO

## 2022-01-17 DIAGNOSIS — K7031 Alcoholic cirrhosis of liver with ascites: Secondary | ICD-10-CM | POA: Diagnosis not present

## 2022-01-17 DIAGNOSIS — K7460 Unspecified cirrhosis of liver: Secondary | ICD-10-CM

## 2022-01-17 LAB — BASIC METABOLIC PANEL
BUN: 5 mg/dL — ABNORMAL LOW (ref 6–23)
CO2: 27 mEq/L (ref 19–32)
Calcium: 7.9 mg/dL — ABNORMAL LOW (ref 8.4–10.5)
Chloride: 104 mEq/L (ref 96–112)
Creatinine, Ser: 0.52 mg/dL (ref 0.40–1.50)
GFR: 119.22 mL/min (ref >60.00)
Glucose, Bld: 108 mg/dL — ABNORMAL HIGH (ref 70–99)
Potassium: 3.6 mEq/L (ref 3.5–5.1)
Sodium: 140 mEq/L (ref 135–145)

## 2022-01-17 MED ORDER — FUROSEMIDE 40 MG PO TABS
40.0000 mg | ORAL_TABLET | Freq: Every day | ORAL | 0 refills | Status: DC
  Filled 2022-01-17 (×2): qty 30, 30d supply, fill #0

## 2022-01-17 MED ORDER — SPIRONOLACTONE 100 MG PO TABS
100.0000 mg | ORAL_TABLET | Freq: Every day | ORAL | 0 refills | Status: DC
  Filled 2022-01-17 (×2): qty 30, 30d supply, fill #0

## 2022-01-17 NOTE — Telephone Encounter (Signed)
Inbound call from patient wife returning call. Please give patient a call to further advise.

## 2022-01-17 NOTE — Telephone Encounter (Signed)
Patient came for his labs as requested. Meds refilled.

## 2022-01-17 NOTE — Telephone Encounter (Signed)
Patient has not come for the labs as agreed upon. Tried to call the patient and the spouse. No answer. Either phone. Did not leave a message. Will try again later in the day if the patient does not come for the labs.  Spoke with pharmacy. The prescriptions have not been picked up. Canceled the prescriptions.

## 2022-01-22 NOTE — Telephone Encounter (Signed)
Patient called to follow up on FMLA paperwork.

## 2022-01-23 NOTE — Telephone Encounter (Signed)
FMLA paperwork was placed on Shawn Martin's desk for completion since patient saw her the last 2 times he was seen. Shawn is at the hospital this week and next until Thursday.  Patient advised via MyChart that we will address his paperwork next week when he has an appointment with Dr. Havery Moros on Friday.

## 2022-01-23 NOTE — Telephone Encounter (Signed)
He did bring in the papers. I confirmed with [place]. I do not know what happens to them after that. Would you take it from here, please?

## 2022-01-24 ENCOUNTER — Emergency Department
Admission: EM | Admit: 2022-01-24 | Discharge: 2022-01-24 | Disposition: A | Discharge status: Home / Self Care | Payer: BC Managed Care – PPO | Attending: Emergency Medicine | Admitting: Emergency Medicine

## 2022-01-24 ENCOUNTER — Emergency Department: Payer: BC Managed Care – PPO

## 2022-01-24 ENCOUNTER — Telehealth: Payer: Self-pay

## 2022-01-24 ENCOUNTER — Other Ambulatory Visit: Payer: Self-pay

## 2022-01-24 DIAGNOSIS — Z20822 Contact with and (suspected) exposure to covid-19: Secondary | ICD-10-CM | POA: Diagnosis not present

## 2022-01-24 DIAGNOSIS — J988 Other specified respiratory disorders: Secondary | ICD-10-CM | POA: Diagnosis not present

## 2022-01-24 DIAGNOSIS — R0981 Nasal congestion: Secondary | ICD-10-CM | POA: Diagnosis present

## 2022-01-24 DIAGNOSIS — I10 Essential (primary) hypertension: Secondary | ICD-10-CM | POA: Insufficient documentation

## 2022-01-24 DIAGNOSIS — J22 Unspecified acute lower respiratory infection: Secondary | ICD-10-CM

## 2022-01-24 LAB — BASIC METABOLIC PANEL
Anion gap: 12 (ref 5–15)
BUN: 6 mg/dL (ref 6–20)
CO2: 25 mmol/L (ref 22–32)
Calcium: 8.2 mg/dL — ABNORMAL LOW (ref 8.9–10.3)
Chloride: 99 mmol/L (ref 98–111)
Creatinine, Ser: 0.66 mg/dL (ref 0.61–1.24)
GFR, Estimated: >60 mL/min (ref >60)
Glucose, Bld: 135 mg/dL — ABNORMAL HIGH (ref 70–99)
Potassium: 3.3 mmol/L — ABNORMAL LOW (ref 3.5–5.1)
Sodium: 136 mmol/L (ref 135–145)

## 2022-01-24 LAB — TROPONIN I (HIGH SENSITIVITY): Troponin I (High Sensitivity): 12 ng/L (ref <18)

## 2022-01-24 LAB — CBC
HCT: 39.5 % (ref 39.0–52.0)
Hemoglobin: 13.9 g/dL (ref 13.0–17.0)
MCH: 35.4 pg — ABNORMAL HIGH (ref 26.0–34.0)
MCHC: 35.2 g/dL (ref 30.0–36.0)
MCV: 100.5 fL — ABNORMAL HIGH (ref 80.0–100.0)
Platelets: 130 10*3/uL — ABNORMAL LOW (ref 150–400)
RBC: 3.93 MIL/uL — ABNORMAL LOW (ref 4.22–5.81)
RDW: 13.7 % (ref 11.5–15.5)
WBC: 4.3 10*3/uL (ref 4.0–10.5)
nRBC: 0 % (ref 0.0–0.2)

## 2022-01-24 LAB — COMPREHENSIVE METABOLIC PANEL
ALT: 73 U/L — ABNORMAL HIGH (ref 0–44)
AST: 186 U/L — ABNORMAL HIGH (ref 15–41)
Albumin: 3 g/dL — ABNORMAL LOW (ref 3.5–5.0)
Alkaline Phosphatase: 179 U/L — ABNORMAL HIGH (ref 38–126)
Anion gap: 9 (ref 5–15)
BUN: 6 mg/dL (ref 6–20)
CO2: 25 mmol/L (ref 22–32)
Calcium: 8.1 mg/dL — ABNORMAL LOW (ref 8.9–10.3)
Chloride: 100 mmol/L (ref 98–111)
Creatinine, Ser: 0.63 mg/dL (ref 0.61–1.24)
GFR, Estimated: >60 mL/min (ref >60)
Glucose, Bld: 131 mg/dL — ABNORMAL HIGH (ref 70–99)
Potassium: 3.3 mmol/L — ABNORMAL LOW (ref 3.5–5.1)
Sodium: 134 mmol/L — ABNORMAL LOW (ref 135–145)
Total Bilirubin: 5.8 mg/dL — ABNORMAL HIGH (ref 0.3–1.2)
Total Protein: 8.8 g/dL — ABNORMAL HIGH (ref 6.5–8.1)

## 2022-01-24 LAB — PHOSPHATIDYLETHANOL (PETH)
Phosphatidylethanol (PEth): 708 ng/mL
Phosphatidylethanol: POSITIVE — AB

## 2022-01-24 LAB — RESP PANEL BY RT-PCR (FLU A&B, COVID) ARPGX2
Influenza A by PCR: NEGATIVE
Influenza B by PCR: NEGATIVE
SARS Coronavirus 2 by RT PCR: NEGATIVE

## 2022-01-24 MED ORDER — AMOXICILLIN-POT CLAVULANATE 875-125 MG PO TABS: 1.0000 | ORAL_TABLET | Freq: Two times a day (BID) | ORAL | 0 refills | Status: AC

## 2022-01-24 MED ORDER — ONDANSETRON 4 MG PO TBDP: 4.0000 mg | ORAL_TABLET | Freq: Three times a day (TID) | ORAL | 0 refills | Status: AC | PRN

## 2022-01-24 MED ORDER — BENZONATATE 200 MG PO CAPS: 200.0000 mg | ORAL_CAPSULE | Freq: Three times a day (TID) | ORAL | 0 refills | Status: AC | PRN

## 2022-01-24 MED ORDER — DOXYCYCLINE MONOHYDRATE 100 MG PO TABS: 100.0000 mg | ORAL_TABLET | Freq: Two times a day (BID) | ORAL | 0 refills | Status: AC

## 2022-01-24 MED ORDER — ALBUTEROL SULFATE HFA 108 (90 BASE) MCG/ACT IN AERS: 2.0000 | INHALATION_SPRAY | Freq: Four times a day (QID) | RESPIRATORY_TRACT | 2 refills | Status: AC | PRN

## 2022-01-24 NOTE — ED Notes (Signed)
Pt noted to be jaundice

## 2022-01-24 NOTE — Telephone Encounter (Signed)
Call placed to the patient. No answer. Left a message asking he come in for an appointment with Dr Havery Moros 01/27/22 at 11:30 am. This will be to complete the FMLA papers he is needing and has left with [place].  Call to his spouse. She agrees to the appointment and will try to get his to come as scheduled. Understands the reason. States the patient "has pretty much laid on the couch all week because he does not feel good." Reports the patient has left the house telling her he is going to the ER. She reports she found an empty Ny-Quil bottle hidden and indicates that he probably drank it last night trying to sleep.

## 2022-01-24 NOTE — Telephone Encounter (Signed)
Okay thanks [employer]. Sorry to hear it sounds like he is still drinking. He will need to be seen in order for FMLA paperwork to be done. Hopefully he can keep that appointment.

## 2022-01-24 NOTE — Discharge Instructions (Addendum)
-  I suspect that you likely have bronchitis, however given the duration of your symptoms, we will cover you with antibiotics to treat for possible developing pneumonia.  -Please take the full course of the antibiotics as prescribed.  -You may additionally take benzonatate as needed for cough.  You may utilize the albuterol inhaler as needed for chest tightness/shortness of breath.  You may use the ondansetron as needed for nausea/vomiting.  -Follow-up with your primary care provider within the next 2 to 3 days for reevaluation.  -Return to the emergency department anytime if you begin to experience any new or worsening symptoms.

## 2022-01-24 NOTE — ED Provider Notes (Signed)
[facility] Provider Note    Event Date/Time   First MD Initiated Contact with Patient 01/24/22 1110     (approximate)   History   Chief Complaint Nasal Congestion   HPI Shawn Martin is a 46 y.o. male, history of alcohol cirrhosis, hypertension, anemia, presents emergency department for evaluation of sinus/chest congestion.  He states he has had persistent cough, congestion, body aches for the past 10 days.  He states that his symptoms have failed to improve.  He notes that other family members have been sick recently as well with similar symptoms.  Denies any recent fevers or chills.  Denies chest pain, shortness of breath, abdominal pain, flank pain, nausea/vomiting, diarrhea, dysuria, no/tingling upper or lower extremities, or dizziness/lightheadedness.  History Limitations: No limitations.        Physical Exam  Triage Vital Signs: ED Triage Vitals  Enc Vitals Group     BP 01/24/22 1107 (!) 161/100     Pulse Rate 01/24/22 1107 83     Resp 01/24/22 1107 13     Temp 01/24/22 1107 98 F (36.7 C)     Temp Source 01/24/22 1107 Oral     SpO2 01/24/22 1107 98 %     Weight --      Height --      Head Circumference --      Peak Flow --      Pain Score 01/24/22 1115 6     Pain Loc --      Pain Edu? --      Excl. in [place]? --     Most recent vital signs: Vitals:   01/24/22 1107 01/24/22 1114  BP: (!) 161/100   Pulse: 83   Resp: 13   Temp: 98 F (36.7 C)   SpO2: 98% 98%    General: Awake, NAD.  Skin: Warm, dry. No rashes or lesions.  Eyes: PERRL. Conjunctivae normal.  CV: Good peripheral perfusion.  Resp: Normal effort.  Lung sounds show mild rhonchi/wheezing bilaterally in the bases. Abd: Soft, non-tender. No distention.  Neuro: At baseline. No gross neurological deficits.  Musculoskeletal: Normal ROM of all extremities.  Focused Exam: Throat exam unremarkable.  No tonsillar swelling or exudates.  Physical Exam    ED Results  / Procedures / Treatments  Labs (all labs ordered are listed, but only abnormal results are displayed) Labs Reviewed  BASIC METABOLIC PANEL - Abnormal; Notable for the following components:      Result Value   Potassium 3.3 (*)    Glucose, Bld 135 (*)    Calcium 8.2 (*)    All other components within normal limits  CBC - Abnormal; Notable for the following components:   RBC 3.93 (*)    MCV 100.5 (*)    MCH 35.4 (*)    Platelets 130 (*)    All other components within normal limits  COMPREHENSIVE METABOLIC PANEL - Abnormal; Notable for the following components:   Sodium 134 (*)    Potassium 3.3 (*)    Glucose, Bld 131 (*)    Calcium 8.1 (*)    Total Protein 8.8 (*)    Albumin 3.0 (*)    AST 186 (*)    ALT 73 (*)    Alkaline Phosphatase 179 (*)    Total Bilirubin 5.8 (*)    All other components within normal limits  RESP PANEL BY RT-PCR (FLU A&B, COVID) ARPGX2  TROPONIN I (HIGH SENSITIVITY)  TROPONIN I (HIGH SENSITIVITY)  EKG Sinus rhythm, rate of 80, no segment changes, normal QRS, no QT prolongation.  No AV blocks.    RADIOLOGY  ED Provider Interpretation: I personally viewed and interpreted this x-ray, no evidence of acute cardiopulmonary process.  DG Chest 2 View  Result Date: 01/24/2022 CLINICAL DATA:  Shortness of breath, sinus pressure, headache. EXAM: CHEST - 2 VIEW COMPARISON:  Chest radiograph 11/22/2021 FINDINGS: The cardiomediastinal silhouette is normal. There is no focal consolidation or pulmonary edema. There is no pleural effusion or pneumothorax There is no acute osseous abnormality. IMPRESSION: No radiographic evidence of acute cardiopulmonary process. Electronically Signed   By: Valetta Mole M.D.   On: 01/24/2022 11:51    PROCEDURES:  Critical Care performed: N/A.  Procedures    MEDICATIONS ORDERED IN ED: Medications - No data to display   IMPRESSION / MDM / [place] / ED COURSE  I reviewed the triage vital signs and the  nursing notes.                              Differential diagnosis includes, but is not limited to, COVID-19, influenza, bronchitis, commune acquired pneumonia, allergic rhinitis, sinusitis.  ED Course Patient appears well, mildly hypertensive at 161/100.  He states that he has not taken his blood pressure medications this morning.  Otherwise normal vitals.  SPO2 98% on room air.  CBC shows no leukocytosis or anemia.  CMP shows transaminitis, similar to previous values and consistent with his underlying hepatic history.  No other significant electrolyte abnormalities.  No AKI.  Respiratory panel negative for COVID-19 or influenza.  EKG unremarkable.  Initial troponin 12, consistent with previous values.  Unlikely ACS or myocarditis/pericarditis.  Assessment/Plan Patient presents with cough, congestion, and body aches x10 days.  He states that his symptoms have failed to improve.  Negative for COVID-19 and influenza.  Chest x-ray does not show any evidence of pneumonia, however he does have mild wheezing and rhonchi present in the bases bilaterally.  Suspect likely bronchitis versus early pneumonia.  His symptoms have been prolonged over the course of 10 days.  He is clinically stable at this time and his lab work-up is reassuring, will provide with prescription for antibiotics and have him follow-up with his primary care provider within the next few days.  Additionally provide him with albuterol inhaler, ondansetron, and benzonatate.  Encouraged him to continue taking Tylenol/ibuprofen as needed for fever/discomfort.  He was amenable to this plan.  Will discharge.  Considered admission for this patient, but given his stable presentation and unremarkable work-up, he is unlikely to benefit from admission.  Provided the patient with anticipatory guidance, return precautions, and educational material. Encouraged the patient to return to the emergency department at any time if they begin to  experience any new or worsening symptoms. Patient expressed understanding and agreed with the plan.   Patient's presentation is most consistent with acute complicated illness / injury requiring diagnostic workup.       FINAL CLINICAL IMPRESSION(S) / ED DIAGNOSES   Final diagnoses:  Lower respiratory infection (e.g., bronchitis, pneumonia, pneumonitis, pulmonitis)     Rx / DC Orders   ED Discharge Orders          Ordered    amoxicillin-clavulanate (AUGMENTIN) 875-125 MG tablet  2 times daily        01/24/22 1324    doxycycline (ADOXA) 100 MG tablet  2 times daily  01/24/22 1324    albuterol (VENTOLIN HFA) 108 (90 Base) MCG/ACT inhaler  Every 6 hours PRN        01/24/22 1325    benzonatate (TESSALON) 200 MG capsule  3 times daily PRN        01/24/22 1325    ondansetron (ZOFRAN-ODT) 4 MG disintegrating tablet  Every 8 hours PRN        01/24/22 1325             Note:  This document was prepared using Dragon voice recognition software and may include unintentional dictation errors.   Teodoro Spray, [place] 01/24/22 1326    Duffy Bruce, MD 01/25/22 [phone]

## 2022-01-24 NOTE — Telephone Encounter (Signed)
Beth I looked over the paperwork he is requesting [place] to complete, and I am happy to help him with this paperwork. However, he has not been seen since July and I do not have enough information from him on this paperwork to fill this out appropriately. Unfortunately it will require a clinic visit to complete this, and update the status of his issues, as I cannot answer questions about his work limitations without more information from him and an update as to how he is doing. Please let him know this.   I am willing to add him on to my schedule on 10/30 at 1130 if he can make it at that time. Otherwise if he cannot make it on 10/30 I will not be able to complete this by 10/31 as his other clinic visit is scheduled later in the week. If he can make 10/30 at 1130 great, you can cancel his other pending appointment with me later in the week. Thanks

## 2022-01-24 NOTE — ED Triage Notes (Signed)
Pt complains of facial/sinus pressure, headache, chest congestion, with pt reported vomited, denies diarrhea and fever.

## 2022-01-24 NOTE — ED Provider Notes (Signed)
Formatting of this note is different from the original.  Images from the original note were not included.      [facility]  Provider Note     Event Date/Time    First MD Initiated Contact with Patient 01/24/22 1110      (approximate)    History     Chief Complaint  Nasal Congestion    HPI  Lee Patterson is a 46 y.o. male, history of alcohol cirrhosis, hypertension, anemia, presents emergency department for evaluation of sinus/chest congestion.  He states he has had persistent cough, congestion, body aches for the past 10 days.  He states that his symptoms have failed to improve.  He notes that other family members have been sick recently as well with similar symptoms.  Denies any recent fevers or chills.  Denies chest pain, shortness of breath, abdominal pain, flank pain, nausea/vomiting, diarrhea, dysuria, no/tingling upper or lower extremities, or dizziness/lightheadedness.    History Limitations: No limitations.        Physical Exam   Triage Vital Signs:  ED Triage Vitals   Enc Vitals Group      BP 01/24/22 1107 (!) 161/100      Pulse Rate 01/24/22 1107 83      Resp 01/24/22 1107 13      Temp 01/24/22 1107 98 F (36.7 C)      Temp Source 01/24/22 1107 Oral      SpO2 01/24/22 1107 98 %      Weight --       Height --       Head Circumference --       Peak Flow --       Pain Score 01/24/22 1115 6      Pain Loc --       Pain Edu? --       Excl. in GC? --      Most recent vital signs:  Vitals:    01/24/22 1107 01/24/22 1114   BP: (!) 161/100    Pulse: 83    Resp: 13    Temp: 98 F (36.7 C)    SpO2: 98% 98%     General: Awake, NAD.   Skin: Warm, dry. No rashes or lesions.   Eyes: PERRL. Conjunctivae normal.   CV: Good peripheral perfusion.   Resp: Normal effort.  Lung sounds show mild rhonchi/wheezing bilaterally in the bases.  Abd: Soft, non-tender. No distention.   Neuro: At baseline. No gross neurological deficits.   Musculoskeletal: Normal ROM of all extremities.    Focused Exam:  Throat exam unremarkable.  No tonsillar swelling or exudates.    Physical Exam    ED Results / Procedures / Treatments   Labs  (all labs ordered are listed, but only abnormal results are displayed)  Labs Reviewed   BASIC METABOLIC PANEL - Abnormal; Notable for the following components:       Result Value    Potassium 3.3 (*)     Glucose, Bld 135 (*)     Calcium 8.2 (*)     All other components within normal limits   CBC - Abnormal; Notable for the following components:    RBC 3.93 (*)     MCV 100.5 (*)     MCH 35.4 (*)     Platelets 130 (*)     All other components within normal limits   COMPREHENSIVE METABOLIC PANEL - Abnormal; Notable for the following components:    Sodium 134 (*)  Potassium 3.3 (*)     Glucose, Bld 131 (*)     Calcium 8.1 (*)     Total Protein 8.8 (*)     Albumin 3.0 (*)     AST 186 (*)     ALT 73 (*)     Alkaline Phosphatase 179 (*)     Total Bilirubin 5.8 (*)     All other components within normal limits   RESP PANEL BY RT-PCR (FLU A&B, COVID) [id number]   TROPONIN I (HIGH SENSITIVITY)   TROPONIN I (HIGH SENSITIVITY)     EKG  Sinus rhythm, rate of 80, no segment changes, normal QRS, no QT prolongation.  No AV blocks.    RADIOLOGY    ED Provider Interpretation: I personally viewed and interpreted this x-ray, no evidence of acute cardiopulmonary process.    DG Chest 2 View    Result Date: 01/24/2022  CLINICAL DATA:  Shortness of breath, sinus pressure, headache. EXAM: CHEST - 2 VIEW COMPARISON:  Chest radiograph 11/22/2021 FINDINGS: The cardiomediastinal silhouette is normal. There is no focal consolidation or pulmonary edema. There is no pleural effusion or pneumothorax There is no acute osseous abnormality. IMPRESSION: No radiographic evidence of acute cardiopulmonary process. Electronically Signed   By: Valetta Mole M.D.   On: 01/24/2022 11:51      PROCEDURES:    Critical Care performed: N/A.    Procedures    MEDICATIONS ORDERED IN ED:  Medications - No data to display    IMPRESSION / MDM /  [place] / ED COURSE   I reviewed the triage vital signs and the nursing notes.        Differential diagnosis includes, but is not limited to, COVID-19, influenza, bronchitis, commune acquired pneumonia, allergic rhinitis, sinusitis.    ED Course  Patient appears well, mildly hypertensive at 161/100.  He states that he has not taken his blood pressure medications this morning.  Otherwise normal vitals.  SPO2 98% on room air.    CBC shows no leukocytosis or anemia.    CMP shows transaminitis, similar to previous values and consistent with his underlying hepatic history.  No other significant electrolyte abnormalities.  No AKI.    Respiratory panel negative for COVID-19 or influenza.    EKG unremarkable.  Initial troponin 12, consistent with previous values.  Unlikely ACS or myocarditis/pericarditis.    Assessment/Plan  Patient presents with cough, congestion, and body aches x10 days.  He states that his symptoms have failed to improve.  Negative for COVID-19 and influenza.  Chest x-ray does not show any evidence of pneumonia, however he does have mild wheezing and rhonchi present in the bases bilaterally.  Suspect likely bronchitis versus early pneumonia.  His symptoms have been prolonged over the course of 10 days.  He is clinically stable at this time and his lab work-up is reassuring, will provide with prescription for antibiotics and have him follow-up with his primary care provider within the next few days.  Additionally provide him with albuterol inhaler, ondansetron, and benzonatate.  Encouraged him to continue taking Tylenol/ibuprofen as needed for fever/discomfort.  He was amenable to this plan.  Will discharge.    Considered admission for this patient, but given his stable presentation and unremarkable work-up, he is unlikely to benefit from admission.    Provided the patient with anticipatory guidance, return precautions, and educational material. Encouraged the patient to return to the  emergency department at any time if they begin to experience any new or  worsening symptoms. Patient expressed understanding and agreed with the plan.     Patient's presentation is most consistent with acute complicated illness / injury requiring diagnostic workup.         FINAL CLINICAL IMPRESSION(S) / ED DIAGNOSES     Final diagnoses:   Lower respiratory infection (e.g., bronchitis, pneumonia, pneumonitis, pulmonitis)     Rx / DC Orders     ED Discharge Orders            Ordered     amoxicillin-clavulanate (AUGMENTIN) 875-125 MG tablet  2 times daily         01/24/22 1324     doxycycline (ADOXA) 100 MG tablet  2 times daily         01/24/22 1324     albuterol (VENTOLIN HFA) 108 (90 Base) MCG/ACT inhaler  Every 6 hours PRN         01/24/22 1325     benzonatate (TESSALON) 200 MG capsule  3 times daily PRN         01/24/22 1325     ondansetron (ZOFRAN-ODT) 4 MG disintegrating tablet  Every 8 hours PRN         01/24/22 1325               Note:  This document was prepared using Dragon voice recognition software and may include unintentional dictation errors.    Varney Daily, [place]  01/24/22 1326      Shaune Pollack, MD  01/25/22 0725    Electronically signed by Shaune Pollack, MD at 01/25/2022  7:25 AM EDT    Associated attestation - Shaune Pollack, MD - 01/25/2022  7:25 AM EDT  Formatting of this note is different from the original.  Medical screening examination/treatment/procedure(s) were performed by non-physician practitioner and as supervising physician I was immediately available for consultation/collaboration.     EKG Interpretation    Date/Time:  Friday January 24 2022 11:06:33 EDT  Ventricular Rate:  80  PR Interval:  187  QRS Duration: 97  QT Interval:  450  QTC Calculation: 520  R Axis:   34  Text Interpretation: Sinus rhythm Consider right atrial enlargement Abnormal R-wave progression, early transition Left ventricular hypertrophy Prolonged QT interval Baseline wander in lead(s) V4 Confirmed by  UNCONFIRMED, DOCTOR ([id number]), editor Kearney Hard [id number]) on 01/24/2022 11:23:24 AM

## 2022-01-24 NOTE — ED Triage Notes (Signed)
Formatting of this note might be different from the original.  Pt complains of facial/sinus pressure, headache, chest congestion, with pt reported vomited, denies diarrhea and fever.   Electronically signed by Rene Kocher, RN at 01/24/2022 11:14 AM EDT

## 2022-01-24 NOTE — ED Notes (Signed)
Formatting of this note might be different from the original.  Pt noted to be jaundice  Electronically signed by Rex Kras, RN at 01/24/2022  1:36 PM EDT

## 2022-01-27 ENCOUNTER — Ambulatory Visit (INDEPENDENT_AMBULATORY_CARE_PROVIDER_SITE_OTHER): Payer: BC Managed Care – PPO | Admitting: Gastroenterology

## 2022-01-27 ENCOUNTER — Encounter: Payer: Self-pay | Admitting: Gastroenterology

## 2022-01-27 ENCOUNTER — Other Ambulatory Visit (INDEPENDENT_AMBULATORY_CARE_PROVIDER_SITE_OTHER): Payer: BC Managed Care – PPO

## 2022-01-27 VITALS — BP 150/98 | HR 94 | Ht 69.5 in | Wt 196.1 lb

## 2022-01-27 DIAGNOSIS — K7031 Alcoholic cirrhosis of liver with ascites: Secondary | ICD-10-CM | POA: Diagnosis not present

## 2022-01-27 DIAGNOSIS — K900 Celiac disease: Secondary | ICD-10-CM

## 2022-01-27 DIAGNOSIS — F1090 Alcohol use, unspecified, uncomplicated: Secondary | ICD-10-CM

## 2022-01-27 LAB — COMPREHENSIVE METABOLIC PANEL
ALT: 56 U/L — ABNORMAL HIGH (ref 0–53)
AST: 133 U/L — ABNORMAL HIGH (ref 0–37)
Albumin: 3.4 g/dL — ABNORMAL LOW (ref 3.5–5.2)
Alkaline Phosphatase: 182 U/L — ABNORMAL HIGH (ref 39–117)
BUN: 8 mg/dL (ref 6–23)
CO2: 30 mEq/L (ref 19–32)
Calcium: 8.9 mg/dL (ref 8.4–10.5)
Chloride: 95 mEq/L — ABNORMAL LOW (ref 96–112)
Creatinine, Ser: 0.67 mg/dL (ref 0.40–1.50)
GFR: 110.42 mL/min (ref >60.00)
Glucose, Bld: 128 mg/dL — ABNORMAL HIGH (ref 70–99)
Potassium: 3.5 mEq/L (ref 3.5–5.1)
Sodium: 132 mEq/L — ABNORMAL LOW (ref 135–145)
Total Bilirubin: 6 mg/dL — ABNORMAL HIGH (ref 0.2–1.2)
Total Protein: 8.6 g/dL — ABNORMAL HIGH (ref 6.0–8.3)

## 2022-01-27 LAB — PROTIME-INR
INR: 1.3 ratio — ABNORMAL HIGH (ref 0.8–1.0)
Prothrombin Time: 14.4 s — ABNORMAL HIGH (ref 9.6–13.1)

## 2022-01-27 LAB — VITAMIN B12: Vitamin B-12: 1237 pg/mL — ABNORMAL HIGH (ref 211–911)

## 2022-01-27 LAB — VITAMIN D 25 HYDROXY (VIT D DEFICIENCY, FRACTURES): VITD: 36.81 ng/mL (ref 30.00–100.00)

## 2022-01-27 NOTE — Progress Notes (Signed)
HPI :  46 year old male here for follow-up visit for cirrhosis.  Recall he has had cirrhosis for a few years now due to alcohol.  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  He has had some relapse of alcohol use over the past few years.  Was hospitalized in June 2022 with altered mental status/intoxication, volume overload.  He has required recurrent diuretic use to control ascites and edema but I had not seen him for some time between 2021 and 2023.  He last checked in with this over the summer.  Had an ultrasound of his abdomen for [facility] screening which showed no mass lesions, he also was noted to have a patent TIPS without any concerning findings.  At the time of that visit he was having some retained fluid and worsening ascites that led to paracentesis.  Ultimately was resumed on Lasix and Aldactone and states they have worked really well for him.  He is on Lasix 40 mg daily and Aldactone 100 mg daily that controls his ascites and lower extremity edema.  Generally doing a good job in that regard.  He has had a few episodes of alcohol intake/recurrent drinking over the past few months.  He states he has had 3-4 episodes in the past 4 months where he would drink heavily for 1 day and then nothing else.  He had a bad URI recently and took a lot of NyQuil, ultimately presented to the hospital and was started on Augmentin for possible pneumonia.  More recently has been feeling pretty well.  He had his last drink of alcohol about a week ago.  In general he states he does not drink at all outside of these episodes.  He has not had problems with recurrent ascites.  He was noticed to be jaundiced in the ED with elevated AST greater than ALT and bilirubin upwards of fives.  Recall he does have a history of celiac disease, has been on a gluten-free diet and states he is very compliant with that.  He is also here to have FMLA  paperwork completed.  Main reason for this is that he has a hard time standing for long periods of time because it makes his edema recur and bothers him quite a bit.  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  He denies drinking around times at work.  Prior workup: Colonoscopy 02/03/20: The perianal and digital rectal examinations were normal. - The terminal ileum appeared normal. - Two sessile polyps were found in the ascending colon. The polyps were 3 to 7 mm in size. These polyps were removed with a cold snare. Resection and retrieval were complete. - A 8 mm polyp was found in the sigmoid colon. The polyp was sessile. The polyp was removed with a cold snare. Resection and retrieval were complete. - A 4 mm polyp was found in the sigmoid colon. The polyp was sessile. The polyp was removed with a cold snare. Resection and retrieval were complete. - Three sessile polyps were found in the rectum. The polyps were 2 to 3 mm in size. These polyps were removed with a cold snare. Resection and retrieval were complete. - Internal hemorrhoids were found during retroflexion. The hemorrhoids were small. - The exam was otherwise without abnormality.  1. Surgical [P], colon, rectum and sigmoid, polyp (4) -  TUBULAR ADENOMA, NEGATIVE FOR HIGH GRADE DYSPLASIA (X1). - HYPERPLASTIC POLYP (X3). 2. Surgical [P], colon, sigmoid and ascending, polyp (3) - TUBULAR ADENOMA, NEGATIVE FOR HIGH GRADE DYSPLASIA (X MULTIPLE).   [place] complete 11/20/21: IMPRESSION: 1. The liver demonstrates a cirrhotic morphology. No liver mass identified. 2. The patient has a patent tips shunt. Bidirectional blood flow is not excluded within the tips on the 2 provided color images. 3. Cholelithiasis. Mild gallbladder wall thickening. No Murphy's sign. 4. Mild ascites.      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     Past Surgical History:  Procedure Laterality Date   IR PARACENTESIS  02/07/2019   IR PARACENTESIS  05/25/2019   IR PARACENTESIS  06/10/2019   IR PARACENTESIS  06/15/2019   IR PARACENTESIS  06/22/2019   IR PARACENTESIS  07/01/2019   IR PARACENTESIS  07/12/2019   IR PARACENTESIS  07/19/2019   IR PARACENTESIS  07/26/2019   IR RADIOLOGIST EVAL & MGMT  07/07/2019   IR RADIOLOGIST EVAL & MGMT  08/23/2019   IR RADIOLOGIST EVAL & MGMT  02/14/2020   IR RADIOLOGIST EVAL & MGMT  09/18/2020   IR THORACENTESIS ASP PLEURAL SPACE W/IMG GUIDE  07/26/2019   IR TIPS  07/26/2019   IR TRANSCATHETER BX  06/22/2019   IR US GUIDE VASC ACCESS RIGHT  06/22/2019   IR VENOGRAM HEPATIC WO HEMODYNAMIC EVALUATION  06/22/2019   NOSE SURGERY     RADIOLOGY WITH ANESTHESIA N/A 07/26/2019   Procedure: TIPS;  Surgeon: Aletta Edouard, MD;  Location: [place];  Service: Radiology;  Laterality: N/A;   Family History  Problem Relation Age of Onset   Healthy Mother    Hypertension Father    Heart attack Father    Leukemia Brother    Colon cancer Neg Hx    Stomach cancer Neg Hx    Pancreatic cancer Neg Hx    Social History   Tobacco Use   Smoking status: Former   Smokeless tobacco: Former    Types: Chew    Quit date: 03/2017  Vaping Use   Vaping Use: Never used  Substance Use Topics   Alcohol use: Not Currently    Comment: every day drinker, 6 pack per day   Drug use: Not Currently   Current Outpatient Medications  Medication Sig Dispense Refill   albuterol (VENTOLIN HFA) 108 (90 Base) MCG/ACT inhaler Inhale 2 puffs into the lungs every 6 (six) hours as needed for wheezing or shortness of breath. 8 g 2   amoxicillin-clavulanate (AUGMENTIN) 875-125 MG tablet Take 1 tablet by mouth 2 (two) times daily for 5 days. 10 tablet 0   ascorbic acid (VITAMIN C) 250 MG CHEW Chew 250 mg  by mouth daily.     benzonatate (TESSALON) 200 MG capsule Take 1 capsule (200 mg total) by mouth 3 (three) times daily as needed for cough. 21 capsule 0   cetirizine (ZYRTEC) 10 MG tablet Take 10 mg by mouth daily.     Cholecalciferol (VITAMIN D3) 50 MCG (2000 UT) TABS Take 2,000 Units by mouth daily.     doxycycline (ADOXA) 100 MG tablet Take 1 tablet (100 mg total) by mouth 2 (two) times daily for 5 days. 10 tablet 0   furosemide (LASIX) 40  MG tablet Take 1 tablet (40 mg total) by mouth daily. And as directed 30 tablet 0   Multiple Vitamin (MULTIVITAMIN WITH MINERALS) TABS tablet Take 1 tablet by mouth daily. 30 tablet 0   ondansetron (ZOFRAN-ODT) 4 MG disintegrating tablet Take 1 tablet (4 mg total) by mouth every 8 (eight) hours as needed for up to 10 days for nausea or vomiting. 20 tablet 0   spironolactone (ALDACTONE) 100 MG tablet Take 1 tablet (100 mg total) by mouth daily. And as directed 30 tablet 0   thiamine 100 MG tablet Take 1 tablet (100 mg total) by mouth daily. 30 tablet 0   zinc gluconate 50 MG tablet Take 50 mg by mouth daily.     No current facility-administered medications for this visit.   Allergies  Allergen Reactions   Gluten Meal Diarrhea    bloating     Review of Systems: All systems reviewed and negative except where noted in HPI.    DG Chest 2 View  Result Date: 01/24/2022 CLINICAL DATA:  Shortness of breath, sinus pressure, headache. EXAM: CHEST - 2 VIEW COMPARISON:  Chest radiograph 11/22/2021 FINDINGS: The cardiomediastinal silhouette is normal. There is no focal consolidation or pulmonary edema. There is no pleural effusion or pneumothorax There is no acute osseous abnormality. IMPRESSION: No radiographic evidence of acute cardiopulmonary process. Electronically Signed   By: Valetta Mole M.D.   On: 01/24/2022 11:51    Lab Results  Component Value Date   WBC 4.3 01/24/2022   HGB 13.9 01/24/2022   HCT 39.5 01/24/2022   MCV 100.5 (H) 01/24/2022   PLT  130 (L) 01/24/2022    Lab Results  Component Value Date   CREATININE 0.66 01/24/2022   CREATININE 0.63 01/24/2022   BUN 6 01/24/2022   BUN 6 01/24/2022   NA 136 01/24/2022   NA 134 (L) 01/24/2022   K 3.3 (L) 01/24/2022   K 3.3 (L) 01/24/2022   CL 99 01/24/2022   CL 100 01/24/2022   CO2 25 01/24/2022   CO2 25 01/24/2022    Lab Results  Component Value Date   ALT 73 (H) 01/24/2022   AST 186 (H) 01/24/2022   ALKPHOS 179 (H) 01/24/2022   BILITOT 5.8 (H) 01/24/2022   Lab Results  Component Value Date   INR 1.2 (H) 10/28/2021   INR 1.4 (H) 09/21/2020   INR 1.6 (H) 09/11/2020     Physical Exam: BP (!) 150/98 (BP Location: Left Arm, Patient Position: Sitting, Cuff Size: Normal)   Pulse 94   Ht 5' 9.5" (1.765 m) Comment: height measured without shoes  Wt 196 lb 2 oz (89 kg)   BMI 28.55 kg/m  Constitutional: Pleasant,well-developed, male in no acute distress. Mild jaundice. Abdominal: Soft, nondistended, nontender. There are no masses palpable.  Extremities: mild edema Neurological: Alert and oriented to person place and time. Psychiatric: Normal mood and affect. Behavior is normal.   ASSESSMENT: 46 y.o. male here for assessment of the following  1. Alcoholic cirrhosis of liver with ascites ([place])   2. Alcohol use disorder   3. Celiac disease    History of alcohol use leading to cirrhosis which has been complicated by ascites/hepatic hydrothorax and esophageal varices.  He is status post TIPS which worked really well for him, unfortunately has had some relapse of alcohol use leading to recurrent ascites and edema but this has been managed quite well with diuretics and doing much better in this regard.  He has had a few  days of alcohol relapse over the past few months, is adamant nothing more than that.  I discussed alcohol use with him in general risks of decompensating cirrhosis/liver failure.  He understands the importance of this, declines assistance with alcohol use  and states he can manage this on his own.  I will recheck his LFTs today to make sure okay after his recent ED visit.  We will also check INR and renal function, calculate MELD.  He is due for recall ultrasound in February for surveillance purposes.  Given his celiac disease we will also recheck a TTG, B12 level, and vitamin D.  Recent iron studies looked okay.  He states he is compliant with gluten-free diet.  I will need to see him every 6 months moving forward for these issues.  I completed FMLA paperwork for him today in regard to his ability to stand for long periods of time, agree that is not good for him with his edema issues.  PLAN: - CMET, TTG, B12 level, vitamin 25 [place], INR - recall [place] in February - TIPS patent on last exam - complete alcohol abstinence - continue lasix / aldactone - helping a lot - FMLA paperwork completed - f/u 6 months  Jolly Mango, MD [facility]

## 2022-01-27 NOTE — Patient Instructions (Signed)
If you are age 48 or older, your body mass index should be between 23-30. Your Body mass index is 28.55 kg/m. If this is out of the aforementioned range listed, please consider follow up with your Primary Care Provider.  If you are age 81 or younger, your body mass index should be between 19-25. Your Body mass index is 28.55 kg/m. If this is out of the aformentioned range listed, please consider follow up with your Primary Care Provider.   ________________________________________________________   Please go to the lab in the basement of our building to have lab work done as you leave today. Hit "B" for basement when you get on the elevator.  When the doors open the lab is on your left.  We will call you with the results. Thank you.  Continue aldactone and lasix.  You will be due for an ultrasound of your liver in February 2024. We will reach out to you when it is time to schedule.  We completed and gave you the original of your FMLA paperwork today.  Thank you for entrusting me with your care and for choosing [facility], Dr. Utah Cellar

## 2022-01-28 ENCOUNTER — Other Ambulatory Visit: Payer: Self-pay

## 2022-01-28 DIAGNOSIS — K900 Celiac disease: Secondary | ICD-10-CM

## 2022-01-28 DIAGNOSIS — K7031 Alcoholic cirrhosis of liver with ascites: Secondary | ICD-10-CM

## 2022-01-28 DIAGNOSIS — F1090 Alcohol use, unspecified, uncomplicated: Secondary | ICD-10-CM

## 2022-01-28 LAB — TISSUE TRANSGLUTAMINASE, IGA: (tTG) Ab, IgA: 2.6 U/mL

## 2022-01-31 ENCOUNTER — Ambulatory Visit: Payer: BC Managed Care – PPO | Admitting: Gastroenterology

## 2022-02-07 ENCOUNTER — Telehealth: Payer: Self-pay

## 2022-02-07 NOTE — Telephone Encounter (Signed)
-----   Message from Yevette Edwards, RN sent at 01/28/2022  8:53 AM EDT ----- Regarding: Labs Hepatic function panel - order is in epic

## 2022-02-07 NOTE — Telephone Encounter (Signed)
MyChart message sent to patient with lab reminder.

## 2022-02-10 NOTE — Telephone Encounter (Signed)
Left patient a detailed vm with lab reminder.

## 2022-02-17 ENCOUNTER — Encounter: Payer: Self-pay | Admitting: Gastroenterology

## 2022-02-17 ENCOUNTER — Other Ambulatory Visit: Payer: Self-pay

## 2022-02-17 DIAGNOSIS — K7031 Alcoholic cirrhosis of liver with ascites: Secondary | ICD-10-CM

## 2022-02-17 MED ORDER — LACTULOSE 10 GM/15ML PO SOLN
20.0000 g | Freq: Every day | ORAL | 0 refills | Status: AC
  Filled 2022-02-17: qty 900, 30d supply, fill #0

## 2022-02-17 MED ORDER — SPIRONOLACTONE 100 MG PO TABS
100.0000 mg | ORAL_TABLET | Freq: Every day | ORAL | 0 refills | Status: DC
  Filled 2022-02-17: qty 30, 30d supply, fill #0
  Filled 2022-02-21: qty 90, 90d supply, fill #0

## 2022-02-17 MED ORDER — FUROSEMIDE 40 MG PO TABS
40.0000 mg | ORAL_TABLET | Freq: Every day | ORAL | 0 refills | Status: DC
  Filled 2022-02-17: qty 30, 30d supply, fill #0
  Filled 2022-02-21: qty 90, 90d supply, fill #0

## 2022-02-18 ENCOUNTER — Other Ambulatory Visit: Payer: Self-pay

## 2022-02-19 ENCOUNTER — Ambulatory Visit: Payer: BC Managed Care – PPO | Admitting: Physician Assistant

## 2022-02-19 ENCOUNTER — Other Ambulatory Visit: Payer: Self-pay

## 2022-02-19 NOTE — Telephone Encounter (Signed)
Patient reviewed and responded to MyChart message. Pt unable to complete labs at this time. He is going to an out of state rehab facility. Last read by Magnum Fain [place] "[name]" at  5:40 PM on 02/17/2022.

## 2022-02-21 ENCOUNTER — Other Ambulatory Visit: Payer: Self-pay

## 2022-03-03 ENCOUNTER — Emergency Department: Admit: 2022-03-03

## 2022-03-03 ENCOUNTER — Inpatient Hospital Stay
Admit: 2022-03-03 | Discharge: 2022-03-03 | Disposition: A | Discharge status: Home / Self Care | Attending: Emergency Medicine

## 2022-03-03 DIAGNOSIS — K7031 Alcoholic cirrhosis of liver with ascites: Principal | ICD-10-CM

## 2022-03-03 LAB — CBC WITH AUTO DIFFERENTIAL
Absolute Baso #: 0.1 10*3/uL (ref 0.0–0.2)
Absolute Eos #: 0.1 10*3/uL (ref 0.0–0.5)
Absolute Lymph #: 0.6 10*3/uL — ABNORMAL LOW (ref 1.0–3.2)
Basophils %: 1.4 % (ref 0.0–2.0)
Eosinophils %: 1.8 % (ref 0.0–7.0)
Hematocrit: 38.7 % (ref 38.0–52.0)
Hemoglobin: 13.5 g/dL (ref 13.0–17.3)
Immature Grans (Abs): 0.03 10*3/uL (ref 0.00–0.06)
Immature Granulocytes: 0.6 % (ref 0.0–0.6)
Lymphocytes: 12.5 % — ABNORMAL LOW (ref 15.0–45.0)
MCH: 37.4 pg — ABNORMAL HIGH (ref 27.0–34.5)
MCHC: 34.9 g/dL (ref 32.0–36.0)
MCV: 107.2 fL — ABNORMAL HIGH (ref 84.0–100.0)
MPV: 9.9 fL (ref 7.2–13.2)
Monocytes Absolute: 0.6 10*3/uL (ref 0.3–1.0)
Monocytes: 11.9 % (ref 4.0–12.0)
Neutrophils %: 71.8 % (ref 42.0–74.0)
Neutrophils Absolute: 3.7 10*3/uL (ref 1.6–7.3)
Platelets: 180 10*3/uL (ref 140–440)
RBC: 3.61 x10e6/mcL — ABNORMAL LOW (ref 4.00–5.60)
RDW: 15.4 % (ref 11.0–16.0)
WBC: 5.1 10*3/uL (ref 3.8–10.6)

## 2022-03-03 LAB — PROTIME-INR
INR: 1.3 — ABNORMAL LOW (ref 1.5–3.5)
Protime: 15.8 seconds — ABNORMAL HIGH (ref 11.6–14.5)

## 2022-03-03 LAB — COMPREHENSIVE METABOLIC PANEL
ALT: 90 U/L — ABNORMAL HIGH (ref 0–50)
AST: 153 U/L — ABNORMAL HIGH (ref 0–50)
Albumin/Globulin Ratio: 0.66 — ABNORMAL LOW (ref 1.00–2.70)
Albumin: 3.1 g/dL — ABNORMAL LOW (ref 3.5–5.2)
Alk Phosphatase: 179 U/L — ABNORMAL HIGH (ref 40–130)
Anion Gap: 9 mmol/L (ref 2–17)
BUN: 6 mg/dL (ref 6–20)
CALCIUM,CORRECTED,CCA: 9.2 mg/dL (ref 8.6–10.0)
CO2: 27 mmol/L (ref 22–29)
Calcium: 8.5 mg/dL — ABNORMAL LOW (ref 8.6–10.0)
Chloride: 95 mmol/L — ABNORMAL LOW (ref 98–107)
Creatinine: 0.6 mg/dL — ABNORMAL LOW (ref 0.7–1.3)
Est, Glom Filt Rate: 119 mL/min/1.73m (ref >=60)
Globulin: 4.7 g/dL — ABNORMAL HIGH (ref 1.9–4.4)
Glucose: 124 mg/dL — ABNORMAL HIGH (ref 70–99)
Osmolaliy Calculated: 262 mOsm/kg — ABNORMAL LOW (ref 270–287)
Potassium: 3.5 mmol/L (ref 3.5–5.3)
Sodium: 131 mmol/L — ABNORMAL LOW (ref 135–145)
Total Bilirubin: 6.65 mg/dL — ABNORMAL HIGH (ref 0.00–1.20)
Total Protein: 7.8 g/dL (ref 6.4–8.3)

## 2022-03-03 MED ORDER — IOPAMIDOL 61 % IV SOLN
61 % | Freq: Once | INTRAVENOUS | Status: AC | PRN
Start: 2022-03-03 — End: 2022-03-03
  Administered 2022-03-03: 17:00:00 100 mL via INTRAVENOUS

## 2022-03-03 NOTE — ED Provider Notes (Signed)
[facility] EMERGENCY DEPT  EMERGENCY DEPARTMENT ENCOUNTER      Pt Name: Lee Patterson  MRN: [id number]  [place] 05-18-1975  Date of evaluation: 03/03/2022  Provider: Aryan Sparks, DO    CHIEF COMPLAINT       Chief Complaint   Patient presents with    Bloated     Reports hx of cirrohsis and started having abd swelling 1 week ago. States he also has a gluten allergy and his abd started swelling 1 week ago after possibly having gluten. Does report etoh use, but that was 3 weeks ago and is currently in a sober living home.            HISTORY OF PRESENT ILLNESS   (Location/Symptom, Timing/Onset, Context/Setting, Quality, Duration, Modifying Factors, Severity)  Note limiting factors.       Patient reports that he has never been to our hospital system as he is from [place] but now lives here. He has history of cirrhosis and has had paracentesis and TIPS before. He reports that his belly is bloated and he feels that he needs drainage. He denies fever, vomiting.    The history is provided by the patient. No language interpreter was used.       Nursing Notes were reviewed.    REVIEW OF SYSTEMS    (2-9 systems for level 4, 10 or more for level 5)     Review of Systems   Constitutional:  Negative for activity change, diaphoresis and fatigue.   HENT:  Negative for congestion.    Gastrointestinal:  Positive for abdominal distention. Negative for abdominal pain, blood in stool, constipation and nausea.       Except as noted above the remainder of the review of systems was reviewed and negative.       PAST MEDICAL HISTORY     Past Medical History:   Diagnosis Date    Cirrhosis ([place])     Hypertension          SURGICAL HISTORY       Past Surgical History:   Procedure Laterality Date    NASAL SINUS SURGERY      PARACENTESIS      THORACENTESIS      TIPS PROCEDURE           CURRENT MEDICATIONS       Previous Medications    No medications on file       ALLERGIES     Gluten    FAMILY HISTORY     History reviewed. No pertinent family history.        SOCIAL HISTORY       Social History     Socioeconomic History    Marital status: Married     Spouse name: None    Number of children: None    Years of education: None    Highest education level: None   Tobacco Use    Smoking status: Never    Smokeless tobacco: Never   Substance and Sexual Activity    Alcohol use: Not Currently     Comment: drank etoh 3 weeks ago       SCREENINGS                               CIWA Assessment  BP: (!) 143/99  Pulse: 92                 PHYSICAL EXAM    (up  to 7 for level 4, 8 or more for level 5)     ED Triage Vitals [03/03/22 0952]   BP Temp Temp Source Pulse Respirations SpO2 Height Weight - Scale   (!) 143/99 97.9 F (36.6 C) Oral 92 18 99 % 1.829 m (6') 98 kg (215 lb 15.1 oz)       Physical Exam  Constitutional:       Appearance: Normal appearance.   HENT:      Head: Normocephalic.      Nose: Nose normal.      Mouth/Throat:      Mouth: Mucous membranes are moist.   Eyes:      Extraocular Movements: Extraocular movements intact.      Pupils: Pupils are equal, round, and reactive to light.   Cardiovascular:      Rate and Rhythm: Normal rate and regular rhythm.      Pulses: Normal pulses.      Heart sounds: Normal heart sounds.   Pulmonary:      Effort: Pulmonary effort is normal.      Breath sounds: Normal breath sounds.   Abdominal:      General: There is distension.      Palpations: There is no mass.      Tenderness: There is no guarding.      Hernia: No hernia is present.   Musculoskeletal:         General: Normal range of motion.      Cervical back: Normal range of motion.   Skin:     General: Skin is warm.      Capillary Refill: Capillary refill takes less than 2 seconds.   Neurological:      General: No focal deficit present.      Mental Status: He is alert and oriented to person, place, and time.   Psychiatric:         Mood and Affect: Mood normal.         Behavior: Behavior normal.         DIAGNOSTIC RESULTS       RADIOLOGY:   Non-plain film images such as CT, Ultrasound  and MRI are read by the radiologist. Plain radiographic images are visualized and preliminarily interpreted by the emergency physician with the below findings:        Interpretation per the Radiologist below, if available at the time of this note:    CT ABDOMEN PELVIS W IV CONTRAST Additional Contrast? None   Final Result      1.  Sclerosis. Large volume of ascites. Note is made of prior TIPS. 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      2. Cholelithiasis.      US GUIDED PARACENTESIS    (Results Pending)         ED BEDSIDE ULTRASOUND:   Performed by ED Physician - none    LABS:  Labs Reviewed   CBC WITH AUTO DIFFERENTIAL - Abnormal; Notable for the following components:       Result Value    RBC 3.61 (*)     MCV 107.2 (*)     MCH 37.4 (*)     Lymphocytes 12.5 (*)     Absolute Lymph # 0.6 (*)     All other components within normal limits   COMPREHENSIVE METABOLIC PANEL - Abnormal; Notable for the following components:    Sodium 131 (*)     Chloride 95 (*)  Glucose 124 (*)     Creatinine 0.6 (*)     OSMOLALITY CALCULATED 262 (*)     Calcium 8.5 (*)     Albumin 3.1 (*)     Globulin 4.7 (*)     Albumin/Globulin Ratio 0.66 (*)     Total Bilirubin 6.65 (*)     Alk Phosphatase 179 (*)     AST 153 (*)     ALT 90 (*)     All other components within normal limits   PROTIME-INR - Abnormal; Notable for the following components:    Protime 15.8 (*)     INR 1.3 (*)     All other components within normal limits       All other labs were within normal range or not returned as of this dictation.    EMERGENCY DEPARTMENT COURSE and DIFFERENTIAL DIAGNOSIS/MDM:   Vitals:    Vitals:    03/03/22 0952   BP: (!) 143/99   Pulse: 92   Resp: 18   Temp: 97.9 F (36.6 C)   TempSrc: Oral   SpO2: 99%   Weight: 98 kg (215 lb 15.1 oz)   Height: 1.829 m (6')         Medical Decision Making  Has ascites and labs are likely at baseline and he has no SOB, belly pain, no fever. He is nontoxic and has no  symptoms that necessitate urgent paracentesis. He used to get frequent ones but is here in town at sober living place. Order placed and left message with IR scheduling so they are able to do as an outpatient. Urged him to return for any concerns.Will also refer to local GI for persistent needs     Amount and/or Complexity of Data Reviewed  Labs: ordered. Decision-making details documented in ED Course.  Radiology: ordered and independent interpretation performed. Decision-making details documented in ED Course.    Risk  Prescription drug management.           REASSESSMENT            CONSULTS:  None    PROCEDURES:  Unless otherwise noted below, none     Procedures      FINAL IMPRESSION      1. Ascites due to alcoholic cirrhosis [facility])          DISPOSITION/PLAN   DISPOSITION Decision To Discharge 03/03/2022 12:50:18 PM      PATIENT REFERRED TO:  Fonnie Jarvis, MD  [address] [place] [address]  [phone]    Call in 2 days        DISCHARGE MEDICATIONS:  New Prescriptions    No medications on file     Controlled Substances Monitoring:          No data to display                (Please note that portions of this note were completed with a voice recognition program.  Efforts were made to edit the dictations but occasionally words are mis-transcribed.)    Avril Busser, DO (electronically signed)  Attending Emergency Physician           Melany Guernsey, DO  03/03/22 1253

## 2022-03-03 NOTE — Discharge Instructions (Signed)
Please call [phone] if you have not heard from anyone about scheduling the paracentesis. Return sooner for any shortness of breath, fever, belly pain, or any other new or concerning symptoms!

## 2022-03-04 ENCOUNTER — Telehealth: Payer: Self-pay | Admitting: Gastroenterology

## 2022-03-04 NOTE — Telephone Encounter (Signed)
Returned call to patient. He states that he found a hospital in [facility] that performs paracentesis and he is headed there now. Pt had no other concerns.

## 2022-03-04 NOTE — Telephone Encounter (Signed)
Inbound call from patient stating that he is away in [facility] in a rehab facility and has been battling ascites in the abdomen for 3 days. Patient is requesting a call back to discuss. Please advise.

## 2022-03-04 NOTE — Telephone Encounter (Signed)
See 03/04/22 telephone encounter for details.

## 2022-03-04 NOTE — Telephone Encounter (Signed)
Dr. Havery Moros, patient sent this MyChart message this morning and then called as well. Please see notes and advise. Thank you  Good morning, I am away in [place] in a rehab facility and I have been battling ascites in my abdomen for 3 days, I double my diuretics and have taken lactalose and it just will not go down. I been to two emergency rooms here in [facility] and they cannot do a paracentesis. The rehab director suggests bringing me back home to have it done if we can get it scheduled asap. Is there anything you can do to help get one scheduled since we are having to drive 4 hours one way?

## 2022-03-04 NOTE — Telephone Encounter (Signed)
A few options. If he is in distress he can go to an ED in [facility] where they can do a paracentesis. If he wants [place] to do this we can refer him to IR for an ASAP consult for paracentesis, large volume, give albumin if more than 5 L removed.  Please send for cell count and differential. Otherwise, his renal function looks stable based on recent labs.  He could increase Aldactone to 200 mg daily and Lasix to 40 mg twice daily (assuming he is taking Aldactone 100 mg daily and Lasix 40 mg daily), but will need to have close follow-up labs in 1 week after making that change.  If he is not reliably able to come up to get labs done here or there, then would hold off on changing his diuretics until he is able to get labs done. Can you let me know what he decides?  Thanks

## 2022-04-21 ENCOUNTER — Telehealth: Payer: Self-pay

## 2022-04-21 DIAGNOSIS — K7031 Alcoholic cirrhosis of liver with ascites: Secondary | ICD-10-CM

## 2022-04-21 NOTE — Telephone Encounter (Signed)
Order placed for RUQ U/S for hcc screening. Staff message to schedulers to call patient to schedule for early February.

## 2022-04-21 NOTE — Telephone Encounter (Signed)
-----  Message from Roetta Sessions, [place] sent at 01/27/2022  5:53 PM EDT ----- Regarding: ruq U/S Patient due for hcc screening in Feb - RUQ U/S (around 2-8)

## 2022-04-28 ENCOUNTER — Telehealth: Payer: Self-pay | Admitting: Gastroenterology

## 2022-04-28 DIAGNOSIS — K7031 Alcoholic cirrhosis of liver with ascites: Secondary | ICD-10-CM

## 2022-04-28 MED ORDER — SPIRONOLACTONE 100 MG PO TABS: 100.0000 mg | ORAL_TABLET | Freq: Every day | ORAL | 0 refills | Status: AC

## 2022-04-28 MED ORDER — FUROSEMIDE 40 MG PO TABS: 40.0000 mg | ORAL_TABLET | Freq: Every day | ORAL | 0 refills | Status: AC

## 2022-04-28 NOTE — Telephone Encounter (Signed)
Patient called states he is trying to fill out some paperwork for life insurance and disability, said he needs the date of when Dr. Havery Martin diagnosed him with Cirrhosis. Also mentioned having to need this information by 12:00 noon today because he has a meeting at 1:00 pm for it.

## 2022-04-28 NOTE — Telephone Encounter (Signed)
Returned call to patient. Based on FMLA paperwork patient was diagnosed on 01/28/2019, I advised Shawn Martin that I will attach FMLA paperwork with the information that he needs and send to him via [place]. Shawn Martin also states that he is now in [place] and plans to be there for "a while" due to personal issues. He states that he is establishing with a PCP, but does not have an appt until 06/05/22. Shawn Martin has requested that we send refill for Lasix and Aldactone to [facility] in [place], [place] until he sees his new PCP and at that point they will take over managing his diuretics. Shawn Martin is aware that I will send in 90-day supply. Shawn Martin verbalized understanding and had no concerns at the end of the call.

## 2023-02-04 ENCOUNTER — Encounter: Payer: Self-pay | Admitting: Gastroenterology

## 2023-06-23 ENCOUNTER — Telehealth: Payer: Self-pay

## 2023-06-26 NOTE — Telephone Encounter (Signed)
 Marland Kitchen  h

## 2023-08-30 DEATH — deceased
